# Patient Record
Sex: Male | Born: 1960 | Race: White | Hispanic: No | Smoking: Never smoker
Health system: Southern US, Community
[De-identification: ages and names within clinical notes are randomized; demographics above are authoritative.]

## PROBLEM LIST (undated history)

## (undated) DIAGNOSIS — F319 Bipolar disorder, unspecified: Secondary | ICD-10-CM

## (undated) DIAGNOSIS — I10 Essential (primary) hypertension: Secondary | ICD-10-CM

## (undated) DIAGNOSIS — E785 Hyperlipidemia, unspecified: Secondary | ICD-10-CM

## (undated) DIAGNOSIS — N529 Male erectile dysfunction, unspecified: Secondary | ICD-10-CM

## (undated) DIAGNOSIS — E119 Type 2 diabetes mellitus without complications: Secondary | ICD-10-CM

## (undated) DIAGNOSIS — R269 Unspecified abnormalities of gait and mobility: Secondary | ICD-10-CM

## (undated) DIAGNOSIS — K4090 Unilateral inguinal hernia, without obstruction or gangrene, not specified as recurrent: Secondary | ICD-10-CM

## (undated) DIAGNOSIS — G959 Disease of spinal cord, unspecified: Secondary | ICD-10-CM

## (undated) DIAGNOSIS — R202 Paresthesia of skin: Secondary | ICD-10-CM

## (undated) DIAGNOSIS — G709 Myoneural disorder, unspecified: Secondary | ICD-10-CM

## (undated) DIAGNOSIS — G629 Polyneuropathy, unspecified: Secondary | ICD-10-CM

## (undated) HISTORY — DX: Type 2 diabetes mellitus without complications: E11.9

## (undated) HISTORY — PX: HERNIA REPAIR: SHX51

## (undated) HISTORY — PX: VASECTOMY: SHX75

## (undated) HISTORY — DX: Essential (primary) hypertension: I10

## (undated) HISTORY — PX: CERVICAL SPINE SURGERY: SHX589

---
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
## 2009-05-27 ENCOUNTER — Ambulatory Visit: Payer: Self-pay | Admitting: General Surgery

## 2009-05-29 HISTORY — PX: APPENDECTOMY: SHX54

## 2009-06-03 ENCOUNTER — Ambulatory Visit: Payer: Self-pay | Admitting: General Surgery

## 2009-06-22 ENCOUNTER — Ambulatory Visit: Payer: Self-pay | Admitting: General Surgery

## 2009-12-10 ENCOUNTER — Ambulatory Visit: Payer: Self-pay | Admitting: General Surgery

## 2009-12-13 LAB — PATHOLOGY REPORT

## 2009-12-14 ENCOUNTER — Ambulatory Visit: Payer: Self-pay | Admitting: Unknown Physician Specialty

## 2012-06-09 LAB — URINALYSIS, COMPLETE
Blood: NEGATIVE
Glucose,UR: NEGATIVE mg/dL (ref 0–75)
Leukocyte Esterase: NEGATIVE
Ph: 5 (ref 4.5–8.0)
Squamous Epithelial: NONE SEEN
WBC UR: <1 /HPF (ref 0–5)

## 2012-06-09 LAB — CBC WITH DIFFERENTIAL/PLATELET
Basophil #: 0.1 10*3/uL (ref 0.0–0.1)
Basophil %: 0.8 %
Eosinophil #: 0.1 10*3/uL (ref 0.0–0.7)
HCT: 40.8 % (ref 40.0–52.0)
HGB: 13.3 g/dL (ref 13.0–18.0)
Lymphocyte #: 1.6 10*3/uL (ref 1.0–3.6)
Lymphocyte %: 23.2 %
MCH: 30.4 pg (ref 26.0–34.0)
MCV: 93 fL (ref 80–100)
Monocyte #: 0.8 x10 3/mm (ref 0.2–1.0)
Monocyte %: 11.3 %
Neutrophil #: 4.3 10*3/uL (ref 1.4–6.5)
Neutrophil %: 63.5 %
Platelet: 228 10*3/uL (ref 150–440)
RBC: 4.38 10*6/uL — ABNORMAL LOW (ref 4.40–5.90)

## 2012-06-09 LAB — DRUG SCREEN, URINE
Benzodiazepine, Ur Scrn: POSITIVE (ref ?–200)
MDMA (Ecstasy)Ur Screen: NEGATIVE (ref ?–500)
Methadone, Ur Screen: NEGATIVE (ref ?–300)
Tricyclic, Ur Screen: POSITIVE (ref ?–1000)

## 2012-06-09 LAB — COMPREHENSIVE METABOLIC PANEL
Alkaline Phosphatase: 73 U/L (ref 50–136)
Anion Gap: 8 (ref 7–16)
BUN: 11 mg/dL (ref 7–18)
Bilirubin,Total: 0.5 mg/dL (ref 0.2–1.0)
Calcium, Total: 8.3 mg/dL — ABNORMAL LOW (ref 8.5–10.1)
EGFR (African American): >60
EGFR (Non-African Amer.): >60
Osmolality: 282 (ref 275–301)
Total Protein: 6.4 g/dL (ref 6.4–8.2)

## 2012-06-09 LAB — ACETAMINOPHEN LEVEL: Acetaminophen: 80 ug/mL — ABNORMAL HIGH

## 2012-06-09 LAB — SALICYLATE LEVEL: Salicylates, Serum: <1.7 mg/dL

## 2012-06-09 LAB — ETHANOL: Ethanol %: <0.003 % (ref 0.000–0.080)

## 2012-06-10 ENCOUNTER — Inpatient Hospital Stay: Payer: Self-pay | Admitting: Psychiatry

## 2012-12-06 ENCOUNTER — Encounter: Payer: Self-pay | Admitting: *Deleted

## 2013-03-18 DIAGNOSIS — M47816 Spondylosis without myelopathy or radiculopathy, lumbar region: Secondary | ICD-10-CM | POA: Insufficient documentation

## 2013-03-18 DIAGNOSIS — M545 Low back pain, unspecified: Secondary | ICD-10-CM | POA: Insufficient documentation

## 2013-05-14 ENCOUNTER — Ambulatory Visit: Payer: Self-pay | Admitting: Family Medicine

## 2014-09-18 NOTE — H&P (Signed)
PATIENT NAME:  Derek Garza, Derek Garza MR#:  [id number] DATE OF BIRTH:  06-Mar-1961  DATE OF ADMISSION:  06/10/2012  REFERRING PHYSICIAN: Cecille Amsterdam. Mayford Knife, MD  ATTENDING PHYSICIAN: Ganon Demasi B. Kora Groom, MD   IDENTIFYING DATA: The patient is a 54 year old male with history of self-reported bipolar disorder and prescription pill misuse.   CHIEF COMPLAINT: "This is a misunderstanding."  HISTORY OF PRESENT ILLNESS: The patient was brought to the hospital after his wife found him unresponsive on the floor upon her return from a beach trip. The patient admits that he took several Tylenol PM in order to sleep. He reports that his mail order of narcotic pain killers prescribed for back pain did not arrive in the mail until today. For the past week, he did not have access to his medications. He denies that he was misusing his prescriptions, rather explained that the pharmacy was late. As he was off his narcotics, he tried to use other medications to treat his pain and on admission had elevated Tylenol level. The patient adamantly denies any problems with prescription pill. Per wife's report, however, he frequently misuses his pain medications, especially when she is out of town. She just returned from a beach trip with some girlfriends. The patient is rather paranoid. He believes that the wife is having an affair. He has all sorts of ideas about money inheritance that are disorganized. The wife also reports that the patient has been prescribed Xanax (yellow bar) that he takes 3 times a day, and the patient and his wife would prefer that he was no longer on the Xanax. The patient reports a long history of mood instability but was diagnosed with bipolar disorder in 2000. At that time, he had insurance and was treated by a psychiatrist. He is unable to name any medications that he took in the past. He lost his job since and has been unemployed and uninsured. At this point, however, he has insurance through his wife's  employer but apparently did not establish a relationship with a psychiatrist. He denies any problems. No depression, psychosis, anxiety or symptoms suggestive of bipolar mania. He denies alcohol, illicit substance or prescription drug misuse.   PAST PSYCHIATRIC HISTORY: There were no prior psychiatric hospitalizations or substance abuse treatment. As above, was treated for bipolar for a period of time. He admits to 2 prior medications overdoses but denies that they were ever suicide attempts.   FAMILY PSYCHIATRIC HISTORY: None reported.   PAST MEDICAL HISTORY: Back pain from multiple herniated disks, hypertension, congestive heart failure.   ALLERGIES: PERCOCET.   MEDICATIONS ON ADMISSION: Amitriptyline 50 mg at bedtime, Lotensin 40 mg daily, furosemide 80 mg daily, Neurontin 600 mg twice daily, potassium 10 mEq daily; Tylenol No. 4, 60 mg twice daily; Xanax unknown dose (yellow bar) 3 times daily.   SOCIAL HISTORY: He reportedly used to make good money. He used to build motorcycles. He lost all his money, unclear as to why. He is in some trouble with IRS and also has difficulties executing his mother's well. He lives with his wife who works for [employer]. Has not been able to work due to back problems. He applied for disability but has not heard back from them yet.   REVIEW OF SYSTEMS:  CONSTITUTIONAL: No fevers or chills. No weight changes.  EYES: No double or blurred vision.  ENT: No hearing loss.  RESPIRATORY: No shortness of breath or cough.  CARDIOVASCULAR: No chest pain or orthopnea.  GASTROINTESTINAL: No abdominal pain, nausea, vomiting or diarrhea.  GENITOURINARY: No incontinence or frequency.  ENDOCRINE: No heat or cold intolerance.  LYMPHATIC: No anemia or easy bruising.  INTEGUMENTARY: No acne or rash. MUSCULOSKELETAL: Positive for back pain radiating to both legs.  NEUROLOGIC: No tingling or weakness.  PSYCHIATRIC: See history of present illness for details.   PHYSICAL  EXAMINATION:  VITAL SIGNS: Blood pressure 114/80, pulse 113, respirations 20, temperature 98.3. GENERAL: This is a well-developed male in no acute distress.  HEENT: The pupils are equal, round and reactive to light. Sclerae are anicteric.  NECK: Supple. No thyromegaly.  LUNGS: Clear to auscultation. No dullness to percussion.  HEART: Regular rhythm and rate. No murmurs, rubs or gallops.  ABDOMEN: Soft, nontender, nondistended. Positive bowel sounds.  MUSCULOSKELETAL: Normal muscle strength in all extremities.  SKIN: No rashes or bruises.  LYMPHATIC: No cervical adenopathy.  NEUROLOGIC: Cranial nerves II through XII are intact.   LABORATORY AND DIAGNOSTIC DATA: Chemistries are within normal limits. Blood alcohol level is zero. LFTs within normal limits. TSH not done. Urine tox screen positive for amphetamines, benzodiazepines and tricyclic antidepressants. CBC within normal limits. Urinalysis is not suggestive of urinary tract infection. Serum acetaminophen on admission 80, on recheck 26. Serum salicylate 1.7. EKG: Normal sinus rhythm, nonspecific intraventricular conduction block, abnormal EKG.  MENTAL STATUS EXAMINATION ON ADMISSION: The patient is evaluated in the Emergency Room. He is alert and oriented to person, place and time, somewhat to the situation. He does not remember any details leading to his admission. He is irritable and marginally cooperative. He is wearing hospital scrubs and a yellow shirt. He maintains some eye contact. His speech is pressured at times. Mood is expansive with labile affect. Thought processing: There are racing thoughts and tangential thoughts. He denies suicidal or homicidal ideation but was brought to the hospital after an apparent overdose. He is slightly paranoid. There are no auditory or visual hallucinations. His cognition is grossly intact. He registers 3 out of 3 and recalls 3 out of 3 objects after 5 minutes. He can spell "world" forwards and backwards. He  knows the current president. His insight and judgment are questionable.   SUICIDE RISK ASSESSMENT ON ADMISSION: This is a patient with a long history of depression, mood instability and prescription pill abuse who was admitted to the hospital after a suicide attempt by medication overdose.   DIAGNOSES:  AXIS I: Mood disorder, not otherwise specified. Opioid dependence. Benzodiazepine dependence.  AXIS II: Deferred.  AXIS III: Chronic back pain. Hypertension. Congestive heart failure.  AXIS IV: Mental and physical illness. Chronic pain. Substance abuse. Marital, financial, employment.  AXIS V: Global Assessment of Functioning on admission 25.   PLAN: The patient was admitted to [facility] for safety, stabilization and medication management. He was initially placed on suicide precautions and was closely monitored for any unsafe behaviors. He underwent full psychiatric and risk assessment. He received pharmacotherapy, individual and group psychotherapy, substance abuse counseling and support from therapeutic milieu.  1.  Suicidal ideation: The patient adamantly denies. 2.  Mood: The patient reports history of bipolar disorder. He is currently on no mood stabilizer except for Neurontin and on amitriptyline for depression. We will initially continue all his medications and will discuss introduction of a mood stabilizer when the patient is more stable. 3.  Chronic pain: The patient reports being prescribed Tylenol No. 4, 60 mg twice daily, and I will not prescribe any medication that contains Tylenol given his overdose. Will offer tramadol. 4.  Benzodiazepine  dependence: Per wife's report, the patient is misusing Xanax. He was positive for benzodiazepines on admission. I will placed him on an Ativan taper over 3 days.  5.  Medical: We will continue furosemide, potassium and Lotensin.  6.  Substance abuse treatment: Will discuss it when the patient is in  better shape.  7.  Disposition: He will likely return to home with wife.   ____________________________ Ellin Goodie. Stevenson Windmiller, MD [email or web address] D: 06/11/2012 14:34:24 ET T: 06/11/2012 15:31:34 ET JOB#: [id number]  cc: Braidon Chermak B. Jennet Maduro, MD, <Dictator> Shari Prows MD ELECTRONICALLY SIGNED 06/13/2012 7:53

## 2014-09-18 NOTE — Consult Note (Signed)
Brief Consult Note: Diagnosis: Mood disorder NOS, S/P overdose.   Patient was seen by consultant.   Comments: Will admitt to psychiatry.  Electronic Signatures: Kristine LineaPucilowska, Jolanta (MD)  (Signed 13-Jan-14 21:24)  Authored: Brief Consult Note   Last Updated: 13-Jan-14 21:24 by Kristine LineaPucilowska, Jolanta (MD)

## 2014-12-07 ENCOUNTER — Other Ambulatory Visit: Payer: Self-pay | Admitting: Family Medicine

## 2015-01-27 ENCOUNTER — Encounter: Payer: Self-pay | Admitting: Family Medicine

## 2015-01-27 ENCOUNTER — Ambulatory Visit (INDEPENDENT_AMBULATORY_CARE_PROVIDER_SITE_OTHER): Payer: 59 | Admitting: Family Medicine

## 2015-01-27 VITALS — BP 124/72 | HR 91 | Temp 97.6°F | Ht 70.5 in | Wt 242.0 lb

## 2015-01-27 DIAGNOSIS — E1142 Type 2 diabetes mellitus with diabetic polyneuropathy: Secondary | ICD-10-CM | POA: Insufficient documentation

## 2015-01-27 DIAGNOSIS — I152 Hypertension secondary to endocrine disorders: Secondary | ICD-10-CM | POA: Insufficient documentation

## 2015-01-27 DIAGNOSIS — I10 Essential (primary) hypertension: Secondary | ICD-10-CM | POA: Diagnosis not present

## 2015-01-27 DIAGNOSIS — E119 Type 2 diabetes mellitus without complications: Secondary | ICD-10-CM

## 2015-01-27 DIAGNOSIS — R609 Edema, unspecified: Secondary | ICD-10-CM | POA: Insufficient documentation

## 2015-01-27 DIAGNOSIS — F319 Bipolar disorder, unspecified: Secondary | ICD-10-CM | POA: Diagnosis not present

## 2015-01-27 DIAGNOSIS — E291 Testicular hypofunction: Secondary | ICD-10-CM | POA: Diagnosis not present

## 2015-01-27 DIAGNOSIS — F3181 Bipolar II disorder: Secondary | ICD-10-CM | POA: Insufficient documentation

## 2015-01-27 DIAGNOSIS — E1159 Type 2 diabetes mellitus with other circulatory complications: Secondary | ICD-10-CM | POA: Insufficient documentation

## 2015-01-27 LAB — MICROALBUMIN, URINE WAIVED
Creatinine, Urine Waived: 100 mg/dL (ref 10–300)
Microalb, Ur Waived: 30 mg/L — ABNORMAL HIGH (ref 0–19)

## 2015-01-27 LAB — BAYER DCA HB A1C WAIVED: HB A1C: 7.6 % — AB (ref <7.0)

## 2015-01-27 MED ORDER — TADALAFIL 20 MG PO TABS: 20.0000 mg | ORAL_TABLET | Freq: Every day | ORAL | Status: DC | PRN

## 2015-01-27 MED ORDER — GABAPENTIN 600 MG PO TABS: 600.0000 mg | ORAL_TABLET | Freq: Two times a day (BID) | ORAL | Status: DC

## 2015-01-27 MED ORDER — FUROSEMIDE 80 MG PO TABS: 80.0000 mg | ORAL_TABLET | Freq: Every day | ORAL | Status: DC

## 2015-01-27 MED ORDER — TESTOSTERONE CYPIONATE 200 MG/ML IM SOLN: 200.0000 mg | INTRAMUSCULAR | Status: DC

## 2015-01-27 MED ORDER — POTASSIUM CHLORIDE ER 10 MEQ PO TBCR: 10.0000 meq | EXTENDED_RELEASE_TABLET | Freq: Once | ORAL | Status: DC

## 2015-01-27 MED ORDER — BENAZEPRIL HCL 40 MG PO TABS: 40.0000 mg | ORAL_TABLET | Freq: Every day | ORAL | Status: DC

## 2015-01-27 NOTE — Assessment & Plan Note (Signed)
Discussed diabetes need for better diet and exercise nutrition patient indicates he will try Also discussed with age getting worse will start metformin 500 at bedtime check blood sugars see if he needs it during the daytime also

## 2015-01-27 NOTE — Progress Notes (Signed)
BP 124/72 mmHg  Pulse 91  Temp(Src) 97.6 F (36.4 C)  Ht 5' 10.5" (1.791 m)  Wt 242 lb (109.77 kg)  BMI 34.22 kg/m2  SpO2 95%   Subjective:    Patient ID: Derek Garza, male    DOB: 18-Oct-1960, 54 y.o.   MRN: [id number]  HPI: Derek Garza is a 54 y.o. male  Chief Complaint  Patient presents with  . Diabetes   patient follow-up diabetes has been on no special diet. And not checking blood sugar except for rarely. No noted excursions are low points not taking any medications. Patient had an use medicine since gastric bypass surgery in 2012.  Nerves bipolar stable on medications  Doing testosterone replacement is been 3 weeks since last shot so will not do blood level at this point. Discussed testing on peak and trough levels sometime this fall.   Fluid retention stable on Lasix Gabapentin stable Blood pressure stable  Relevant past medical, surgical, family and social history reviewed and updated as indicated. Interim medical history since our last visit reviewed. Allergies and medications reviewed and updated.  Review of Systems  Constitutional: Negative.   Respiratory: Negative.   Cardiovascular: Negative.     Per HPI unless specifically indicated above     Objective:    BP 124/72 mmHg  Pulse 91  Temp(Src) 97.6 F (36.4 C)  Ht 5' 10.5" (1.791 m)  Wt 242 lb (109.77 kg)  BMI 34.22 kg/m2  SpO2 95%  Wt Readings from Last 3 Encounters:  01/27/15 242 lb (109.77 kg)    Physical Exam  Constitutional: He is oriented to person, place, and time. He appears well-developed and well-nourished. No distress.  HENT:  Head: Normocephalic and atraumatic.  Right Ear: Hearing normal.  Left Ear: Hearing normal.  Nose: Nose normal.  Eyes: Conjunctivae and lids are normal. Right eye exhibits no discharge. Left eye exhibits no discharge. No scleral icterus.  Cardiovascular: Normal rate, regular rhythm and normal heart sounds.   Pulmonary/Chest: Effort normal and breath  sounds normal. No respiratory distress.  Musculoskeletal: Normal range of motion.  Neurological: He is alert and oriented to person, place, and time.  Skin: Skin is intact. No rash noted.  Psychiatric: He has a normal mood and affect. His speech is normal and behavior is normal. Judgment and thought content normal. Cognition and memory are normal.        Assessment & Plan:   Problem List Items Addressed This Visit      Cardiovascular and Mediastinum   Hypertension    The current medical regimen is effective;  continue present plan and medications.       Relevant Medications   tadalafil (CIALIS) 20 MG tablet   furosemide (LASIX) 80 MG tablet   benazepril (LOTENSIN) 40 MG tablet   Other Relevant Orders   Basic metabolic panel     Endocrine   Diabetes mellitus without complication - Primary    Discussed diabetes need for better diet and exercise nutrition patient indicates he will try Also discussed with age getting worse will start metformin 500 at bedtime check blood sugars see if he needs it during the daytime also      Relevant Medications   benazepril (LOTENSIN) 40 MG tablet   Other Relevant Orders   Bayer DCA Hb A1c Waived   Microalbumin, Urine Waived   Bayer DCA Hb A1c Waived   Basic metabolic panel   Hypogonadism in male    No issues with testosterone  Relevant Medications   testosterone cypionate (DEPOTESTOSTERONE CYPIONATE) 200 MG/ML injection   tadalafil (CIALIS) 20 MG tablet     Other   Bipolar 1 disorder    Followed by psychiatry      Peripheral edema   Relevant Medications   potassium chloride (K-DUR) 10 MEQ tablet   furosemide (LASIX) 80 MG tablet       Follow up plan: Return in about 3 months (around 04/28/2015), or if symptoms worsen or fail to improve, for med check.

## 2015-01-27 NOTE — Assessment & Plan Note (Signed)
The current medical regimen is effective;  continue present plan and medications.  

## 2015-01-27 NOTE — Assessment & Plan Note (Signed)
No issues with testosterone

## 2015-01-27 NOTE — Assessment & Plan Note (Signed)
Followed by psychiatry 

## 2015-01-29 ENCOUNTER — Other Ambulatory Visit: Payer: Self-pay | Admitting: Unknown Physician Specialty

## 2015-01-29 DIAGNOSIS — R609 Edema, unspecified: Secondary | ICD-10-CM

## 2015-01-29 MED ORDER — POTASSIUM CHLORIDE ER 10 MEQ PO TBCR: 10.0000 meq | EXTENDED_RELEASE_TABLET | Freq: Every day | ORAL | Status: DC

## 2015-03-03 ENCOUNTER — Encounter: Payer: Self-pay | Admitting: General Surgery

## 2015-03-04 ENCOUNTER — Ambulatory Visit: Payer: Self-pay | Admitting: General Surgery

## 2015-03-25 ENCOUNTER — Other Ambulatory Visit: Payer: Self-pay | Admitting: Family Medicine

## 2015-03-25 ENCOUNTER — Telehealth: Payer: Self-pay | Admitting: Family Medicine

## 2015-03-25 MED ORDER — METFORMIN HCL 500 MG PO TABS: 500.0000 mg | ORAL_TABLET | Freq: Two times a day (BID) | ORAL | Status: DC

## 2015-03-25 NOTE — Telephone Encounter (Signed)
Call patient Is asking about an Rx for metformin. I have never prescribed him metformin nor is metformin in his old records.

## 2015-03-25 NOTE — Telephone Encounter (Signed)
Pt called stated mail order pharm does not have an RX for his Metformin. Pharm is [facility]. Please send this RX ASAP. Thanks.

## 2015-04-26 ENCOUNTER — Encounter: Payer: Self-pay | Admitting: Family Medicine

## 2015-04-26 ENCOUNTER — Ambulatory Visit (INDEPENDENT_AMBULATORY_CARE_PROVIDER_SITE_OTHER): Payer: 59 | Admitting: Family Medicine

## 2015-04-26 VITALS — BP 104/66 | HR 84 | Temp 97.7°F | Ht 71.3 in | Wt 242.0 lb

## 2015-04-26 DIAGNOSIS — E291 Testicular hypofunction: Secondary | ICD-10-CM

## 2015-04-26 DIAGNOSIS — F319 Bipolar disorder, unspecified: Secondary | ICD-10-CM | POA: Diagnosis not present

## 2015-04-26 DIAGNOSIS — I10 Essential (primary) hypertension: Secondary | ICD-10-CM | POA: Diagnosis not present

## 2015-04-26 DIAGNOSIS — E119 Type 2 diabetes mellitus without complications: Secondary | ICD-10-CM

## 2015-04-26 DIAGNOSIS — Z23 Encounter for immunization: Secondary | ICD-10-CM

## 2015-04-26 LAB — BAYER DCA HB A1C WAIVED: HB A1C (BAYER DCA - WAIVED): 7.4 % — ABNORMAL HIGH (ref <7.0)

## 2015-04-26 NOTE — Assessment & Plan Note (Signed)
The current medical regimen is effective;  continue present plan and medications.  

## 2015-04-26 NOTE — Assessment & Plan Note (Addendum)
The current medical regimen is effective;  continue present plan and medications. Discussed need to continue improvement in care especially weight loss diet exercise nutrition

## 2015-04-26 NOTE — Assessment & Plan Note (Signed)
Followed by psychiatry and stable 

## 2015-04-26 NOTE — Progress Notes (Signed)
BP 104/66 mmHg  Pulse 84  Temp(Src) 97.7 F (36.5 C)  Ht 5' 11.3" (1.811 m)  Wt 242 lb (109.77 kg)  BMI 33.47 kg/m2  SpO2 96%   Subjective:    Patient ID: Derek Garza, male    DOB: [birth year], 54 y.o.   MRN: [id number]  HPI: Derek Garza is a 54 y.o. male  Chief Complaint  Patient presents with  . Diabetes  . Hypertension   patient doing well diabetes noted low blood sugar spells hemoglobin A1c continues to decline patient's weight has stayed stable over this fall. Blood pressure doing well no complaints from medications Nerves are stable with medications no issues  Relevant past medical, surgical, family and social history reviewed and updated as indicated. Interim medical history since our last visit reviewed. Allergies and medications reviewed and updated.  Review of Systems  Constitutional: Negative.   Respiratory: Negative.   Cardiovascular: Negative.     Per HPI unless specifically indicated above     Objective:    BP 104/66 mmHg  Pulse 84  Temp(Src) 97.7 F (36.5 C)  Ht 5' 11.3" (1.811 m)  Wt 242 lb (109.77 kg)  BMI 33.47 kg/m2  SpO2 96%  Wt Readings from Last 3 Encounters:  04/26/15 242 lb (109.77 kg)  01/27/15 242 lb (109.77 kg)    Physical Exam  Constitutional: He is oriented to person, place, and time. He appears well-developed and well-nourished. No distress.  HENT:  Head: Normocephalic and atraumatic.  Right Ear: Hearing normal.  Left Ear: Hearing normal.  Nose: Nose normal.  Eyes: Conjunctivae and lids are normal. Right eye exhibits no discharge. Left eye exhibits no discharge. No scleral icterus.  Cardiovascular: Normal rate, regular rhythm and normal heart sounds.   Pulmonary/Chest: Effort normal and breath sounds normal. No respiratory distress.  Musculoskeletal: Normal range of motion.  Neurological: He is alert and oriented to person, place, and time.  Skin: Skin is intact. No rash noted.  Psychiatric: He has a normal mood and  affect. His speech is normal and behavior is normal. Judgment and thought content normal. Cognition and memory are normal.    Results for orders placed or performed in visit on 01/27/15  Bayer DCA Hb A1c Waived  Result Value Ref Range   Bayer DCA Hb A1c Waived 7.6 (H) <7.0 %  Microalbumin, Urine Waived  Result Value Ref Range   Microalb, Ur Waived 30 (H) 0 - 19 mg/L   Creatinine, Urine Waived 100 10 - 300 mg/dL   Microalb/Creat Ratio 30-300 (H) <30 mg/g      Assessment & Plan:   Problem List Items Addressed This Visit      Cardiovascular and Mediastinum   Hypertension    The current medical regimen is effective;  continue present plan and medications.         Endocrine   Hypogonadism in male    The current medical regimen is effective;  continue present plan and medications.       Diabetes mellitus without complication (HCC)    The current medical regimen is effective;  continue present plan and medications. Discussed need to continue improvement in care especially weight loss diet exercise nutrition         Other   Bipolar 1 disorder (HCC)    Followed by psychiatry and stable       Other Visit Diagnoses    Immunization due    -  Primary    Relevant Orders    Flu  Vaccine QUAD 36+ mos PF IM (Fluarix & Fluzone Quad PF) (Completed)        Follow up plan: Return in about 3 months (around 07/27/2015) for Physical Exam March and testosterone A1C.

## 2015-04-27 ENCOUNTER — Encounter: Payer: Self-pay | Admitting: Family Medicine

## 2015-04-27 LAB — BASIC METABOLIC PANEL
BUN / CREAT RATIO: 10 (ref 9–20)
BUN: 10 mg/dL (ref 6–24)
CO2: 26 mmol/L (ref 18–29)
CREATININE: 0.99 mg/dL (ref 0.76–1.27)
Calcium: 9.1 mg/dL (ref 8.7–10.2)
Chloride: 93 mmol/L — ABNORMAL LOW (ref 97–106)
GFR, EST AFRICAN AMERICAN: 99 mL/min/{1.73_m2} (ref >59)
GFR, EST NON AFRICAN AMERICAN: 85 mL/min/{1.73_m2} (ref >59)
GLUCOSE: 220 mg/dL — AB (ref 65–99)
Potassium: 4.7 mmol/L (ref 3.5–5.2)
SODIUM: 137 mmol/L (ref 136–144)

## 2015-05-12 ENCOUNTER — Encounter: Payer: Self-pay | Admitting: *Deleted

## 2015-08-16 ENCOUNTER — Ambulatory Visit (INDEPENDENT_AMBULATORY_CARE_PROVIDER_SITE_OTHER): Payer: 59 | Admitting: Family Medicine

## 2015-08-16 ENCOUNTER — Encounter: Payer: Self-pay | Admitting: Family Medicine

## 2015-08-16 VITALS — BP 122/72 | HR 90 | Temp 98.4°F | Ht 72.0 in | Wt 247.0 lb

## 2015-08-16 DIAGNOSIS — E119 Type 2 diabetes mellitus without complications: Secondary | ICD-10-CM

## 2015-08-16 DIAGNOSIS — J019 Acute sinusitis, unspecified: Secondary | ICD-10-CM | POA: Diagnosis not present

## 2015-08-16 DIAGNOSIS — Z0000 Encounter for general adult medical examination without abnormal findings: Secondary | ICD-10-CM | POA: Diagnosis not present

## 2015-08-16 DIAGNOSIS — I10 Essential (primary) hypertension: Secondary | ICD-10-CM

## 2015-08-16 DIAGNOSIS — E291 Testicular hypofunction: Secondary | ICD-10-CM | POA: Diagnosis not present

## 2015-08-16 DIAGNOSIS — R609 Edema, unspecified: Secondary | ICD-10-CM | POA: Diagnosis not present

## 2015-08-16 DIAGNOSIS — R635 Abnormal weight gain: Secondary | ICD-10-CM | POA: Diagnosis not present

## 2015-08-16 DIAGNOSIS — Z113 Encounter for screening for infections with a predominantly sexual mode of transmission: Secondary | ICD-10-CM

## 2015-08-16 LAB — BAYER DCA HB A1C WAIVED: HB A1C (BAYER DCA - WAIVED): 8.8 % — ABNORMAL HIGH (ref <7.0)

## 2015-08-16 LAB — URINALYSIS, ROUTINE W REFLEX MICROSCOPIC
Bilirubin, UA: NEGATIVE
Ketones, UA: NEGATIVE
Leukocytes, UA: NEGATIVE
Nitrite, UA: NEGATIVE
PH UA: 5.5 (ref 5.0–7.5)
PROTEIN UA: NEGATIVE
RBC, UA: NEGATIVE
Specific Gravity, UA: 1.02 (ref 1.005–1.030)
UUROB: 0.2 mg/dL (ref 0.2–1.0)

## 2015-08-16 MED ORDER — FUROSEMIDE 80 MG PO TABS: 80.0000 mg | ORAL_TABLET | Freq: Every day | ORAL | Status: DC

## 2015-08-16 MED ORDER — GABAPENTIN 600 MG PO TABS: 600.0000 mg | ORAL_TABLET | Freq: Two times a day (BID) | ORAL | Status: DC

## 2015-08-16 MED ORDER — BENAZEPRIL HCL 40 MG PO TABS: 40.0000 mg | ORAL_TABLET | Freq: Every day | ORAL | Status: DC

## 2015-08-16 MED ORDER — EMPAGLIFLOZIN 25 MG PO TABS: 25.0000 mg | ORAL_TABLET | Freq: Every day | ORAL | Status: DC

## 2015-08-16 MED ORDER — TESTOSTERONE CYPIONATE 200 MG/ML IM SOLN: 200.0000 mg | INTRAMUSCULAR | Status: DC

## 2015-08-16 MED ORDER — METFORMIN HCL 500 MG PO TABS: 500.0000 mg | ORAL_TABLET | Freq: Two times a day (BID) | ORAL | Status: DC

## 2015-08-16 MED ORDER — AMOXICILLIN 875 MG PO TABS: 875.0000 mg | ORAL_TABLET | Freq: Two times a day (BID) | ORAL | Status: DC

## 2015-08-16 MED ORDER — POTASSIUM CHLORIDE ER 10 MEQ PO TBCR: 10.0000 meq | EXTENDED_RELEASE_TABLET | Freq: Every day | ORAL | Status: DC

## 2015-08-16 NOTE — Progress Notes (Signed)
BP 122/72 mmHg  Pulse 90  Temp(Src) 98.4 F (36.9 C)  Ht 6' (1.829 m)  Wt 247 lb (112.038 kg)  BMI 33.49 kg/m2  SpO2 95%   Subjective:    Patient ID: Derek Garza, male    DOB: [birth year], 55 y.o.   MRN: [id number]  HPI: Derek ChuteSteven Sappington is a 55 y.o. male  Chief Complaint  Patient presents with  . Annual Exam   patient all in all doing well with medical conditions which are all stable Patient has had cold congestion cough chills ongoing for the last 2 weeks getting worse Medications no side effects Diabetic peripheral neuropathy is doing well with gabapentin Seroquel and nerves are getting along okay And testosterone injection every 2 weeks is doing well.  Relevant past medical, surgical, family and social history reviewed and updated as indicated. Interim medical history since our last visit reviewed. Allergies and medications reviewed and updated.  Review of Systems  Constitutional: Negative.   HENT: Negative.   Eyes: Negative.   Respiratory: Negative.   Cardiovascular: Negative.   Gastrointestinal: Negative.   Endocrine: Negative.   Genitourinary: Negative.   Musculoskeletal: Negative.   Skin: Negative.   Allergic/Immunologic: Negative.   Neurological: Negative.   Hematological: Negative.   Psychiatric/Behavioral: Negative.     Per HPI unless specifically indicated above     Objective:    BP 122/72 mmHg  Pulse 90  Temp(Src) 98.4 F (36.9 C)  Ht 6' (1.829 m)  Wt 247 lb (112.038 kg)  BMI 33.49 kg/m2  SpO2 95%  Wt Readings from Last 3 Encounters:  08/16/15 247 lb (112.038 kg)  04/26/15 242 lb (109.77 kg)  01/27/15 242 lb (109.77 kg)    Physical Exam  Constitutional: He is oriented to person, place, and time. He appears well-developed and well-nourished.  HENT:  Head: Normocephalic and atraumatic.  Right Ear: External ear normal.  Left Ear: External ear normal.  Eyes: Conjunctivae and EOM are normal. Pupils are equal, round, and reactive to  light.  Neck: Normal range of motion. Neck supple.  Cardiovascular: Normal rate, regular rhythm, normal heart sounds and intact distal pulses.   Pulmonary/Chest: Effort normal and breath sounds normal.  Abdominal: Soft. Bowel sounds are normal. There is no splenomegaly or hepatomegaly.  Genitourinary: Rectum normal, prostate normal and penis normal.  Musculoskeletal: Normal range of motion.  Neurological: He is alert and oriented to person, place, and time. He has normal reflexes.  Skin: No rash noted. No erythema.  Psychiatric: He has a normal mood and affect. His behavior is normal. Judgment and thought content normal.    Results for orders placed or performed in visit on 04/26/15  Bayer DCA Hb A1c Waived  Result Value Ref Range   Bayer DCA Hb A1c Waived 7.4 (H) <7.0 %  Basic metabolic panel  Result Value Ref Range   Glucose 220 (H) 65 - 99 mg/dL   BUN 10 6 - 24 mg/dL   Creatinine, Ser 4.090.99 0.76 - 1.27 mg/dL   GFR calc non Af Amer 85 >59 mL/min/1.73   GFR calc Af Amer 99 >59 mL/min/1.73   BUN/Creatinine Ratio 10 9 - 20   Sodium 137 136 - 144 mmol/L   Potassium 4.7 3.5 - 5.2 mmol/L   Chloride 93 (L) 97 - 106 mmol/L   CO2 26 18 - 29 mmol/L   Calcium 9.1 8.7 - 10.2 mg/dL      Assessment & Plan:   Problem List Items Addressed This Visit  Cardiovascular and Mediastinum   Hypertension   Relevant Medications   benazepril (LOTENSIN) 40 MG tablet   furosemide (LASIX) 80 MG tablet     Endocrine   Diabetes mellitus without complication (HCC)    Chest diabetes poor control first-line treatment diet exercise nutrition discuss adding Jardiance Discuss risk and benefit mitigation of side effects      Relevant Medications   benazepril (LOTENSIN) 40 MG tablet   metFORMIN (GLUCOPHAGE) 500 MG tablet   empagliflozin (JARDIANCE) 25 MG TABS tablet   Other Relevant Orders   Bayer DCA Hb A1c Waived   Hypogonadism in male   Relevant Medications   testosterone cypionate  (DEPOTESTOSTERONE CYPIONATE) 200 MG/ML injection   Other Relevant Orders   Testosterone   Testosterone     Other   Peripheral edema   Relevant Medications   furosemide (LASIX) 80 MG tablet    Other Visit Diagnoses    Routine screening for STI (sexually transmitted infection)    -  Primary    Relevant Orders    Hepatitis C Antibody    Acute sinusitis, recurrence not specified, unspecified location        Discussed sinusitis care and treatment use of antibiotics cautions use of over-the-counter medications    Relevant Medications    amoxicillin (AMOXIL) 875 MG tablet    PE (physical exam), annual        Relevant Orders    Comprehensive metabolic panel    Lipid panel    CBC with Differential/Platelet    TSH    Urinalysis, Routine w reflex microscopic (not at [facility])    PSA        Follow up plan: Return in about 3 months (around 11/16/2015) for a1c.

## 2015-08-16 NOTE — Addendum Note (Signed)
Addended byVonita Moss: Chrsitopher Wik on: 08/16/2015 04:53 PM   Modules accepted: Kipp BroodSmartSet

## 2015-08-16 NOTE — Assessment & Plan Note (Signed)
Chest diabetes poor control first-line treatment diet exercise nutrition discuss adding Jardiance Discuss risk and benefit mitigation of side effects

## 2015-08-17 LAB — CBC WITH DIFFERENTIAL/PLATELET
BASOS ABS: 0 10*3/uL (ref 0.0–0.2)
Basos: 0 %
EOS (ABSOLUTE): 0.1 10*3/uL (ref 0.0–0.4)
Eos: 1 %
Hematocrit: 42.7 % (ref 37.5–51.0)
Hemoglobin: 13.8 g/dL (ref 12.6–17.7)
IMMATURE GRANS (ABS): 0 10*3/uL (ref 0.0–0.1)
IMMATURE GRANULOCYTES: 0 %
LYMPHS: 19 %
Lymphocytes Absolute: 1.6 10*3/uL (ref 0.7–3.1)
MCH: 29.2 pg (ref 26.6–33.0)
MCHC: 32.3 g/dL (ref 31.5–35.7)
MCV: 91 fL (ref 79–97)
MONOS ABS: 1 10*3/uL — AB (ref 0.1–0.9)
Monocytes: 12 %
NEUTROS PCT: 68 %
Neutrophils Absolute: 5.7 10*3/uL (ref 1.4–7.0)
PLATELETS: 292 10*3/uL (ref 150–379)
RBC: 4.72 x10E6/uL (ref 4.14–5.80)
RDW: 13 % (ref 12.3–15.4)
WBC: 8.4 10*3/uL (ref 3.4–10.8)

## 2015-08-17 LAB — COMPREHENSIVE METABOLIC PANEL
A/G RATIO: 1.6 (ref 1.2–2.2)
ALT: 22 IU/L (ref 0–44)
AST: 19 IU/L (ref 0–40)
Albumin: 4.2 g/dL (ref 3.5–5.5)
Alkaline Phosphatase: 115 IU/L (ref 39–117)
BILIRUBIN TOTAL: 0.2 mg/dL (ref 0.0–1.2)
BUN / CREAT RATIO: 13 (ref 9–20)
BUN: 15 mg/dL (ref 6–24)
CHLORIDE: 92 mmol/L — AB (ref 96–106)
CO2: 24 mmol/L (ref 18–29)
Calcium: 9.1 mg/dL (ref 8.7–10.2)
Creatinine, Ser: 1.17 mg/dL (ref 0.76–1.27)
GFR calc non Af Amer: 69 mL/min/{1.73_m2} (ref >59)
GFR, EST AFRICAN AMERICAN: 80 mL/min/{1.73_m2} (ref >59)
Globulin, Total: 2.6 g/dL (ref 1.5–4.5)
Glucose: 226 mg/dL — ABNORMAL HIGH (ref 65–99)
POTASSIUM: 4.3 mmol/L (ref 3.5–5.2)
SODIUM: 136 mmol/L (ref 134–144)
TOTAL PROTEIN: 6.8 g/dL (ref 6.0–8.5)

## 2015-08-17 LAB — LIPID PANEL
Chol/HDL Ratio: 4.8 ratio units (ref 0.0–5.0)
Cholesterol, Total: 184 mg/dL (ref 100–199)
HDL: 38 mg/dL — AB (ref >39)
LDL Calculated: 97 mg/dL (ref 0–99)
Triglycerides: 246 mg/dL — ABNORMAL HIGH (ref 0–149)
VLDL Cholesterol Cal: 49 mg/dL — ABNORMAL HIGH (ref 5–40)

## 2015-08-17 LAB — PSA: PROSTATE SPECIFIC AG, SERUM: 3 ng/mL (ref 0.0–4.0)

## 2015-08-17 LAB — HCV ANTIBODY: Hep C Virus Ab: <0.1 s/co ratio (ref 0.0–0.9)

## 2015-08-17 LAB — TSH: TSH: 1.45 u[IU]/mL (ref 0.450–4.500)

## 2015-08-18 ENCOUNTER — Encounter: Payer: Self-pay | Admitting: Family Medicine

## 2015-08-23 ENCOUNTER — Encounter: Payer: Self-pay | Admitting: Family Medicine

## 2015-09-02 ENCOUNTER — Other Ambulatory Visit: Payer: Self-pay | Admitting: Family Medicine

## 2015-09-02 ENCOUNTER — Telehealth: Payer: Self-pay | Admitting: Family Medicine

## 2015-09-02 DIAGNOSIS — E291 Testicular hypofunction: Secondary | ICD-10-CM

## 2015-09-02 NOTE — Telephone Encounter (Signed)
Pt called stated he needs a new RX for Cialis sent to the pharmacy the other one has expired. [facility] [place] [facility] [place] Phone # [phone]. Thanks.

## 2015-09-06 MED ORDER — TADALAFIL 20 MG PO TABS: 20.0000 mg | ORAL_TABLET | Freq: Every day | ORAL | Status: DC | PRN

## 2015-10-15 ENCOUNTER — Other Ambulatory Visit: Payer: Self-pay

## 2015-10-15 NOTE — Telephone Encounter (Signed)
[facility] requesting refill for jardiance.

## 2015-10-18 MED ORDER — EMPAGLIFLOZIN 25 MG PO TABS: 25.0000 mg | ORAL_TABLET | Freq: Every day | ORAL | Status: DC

## 2015-11-17 ENCOUNTER — Ambulatory Visit: Payer: 59 | Admitting: Family Medicine

## 2015-11-22 ENCOUNTER — Ambulatory Visit: Payer: 59 | Admitting: Family Medicine

## 2016-01-03 ENCOUNTER — Ambulatory Visit: Payer: 59 | Admitting: Family Medicine

## 2016-01-10 ENCOUNTER — Ambulatory Visit (INDEPENDENT_AMBULATORY_CARE_PROVIDER_SITE_OTHER): Payer: 59 | Admitting: Family Medicine

## 2016-01-10 ENCOUNTER — Encounter: Payer: Self-pay | Admitting: Family Medicine

## 2016-01-10 ENCOUNTER — Other Ambulatory Visit: Payer: Self-pay

## 2016-01-10 VITALS — BP 118/70 | HR 91 | Temp 97.7°F | Ht 71.0 in | Wt 235.0 lb

## 2016-01-10 DIAGNOSIS — E119 Type 2 diabetes mellitus without complications: Secondary | ICD-10-CM

## 2016-01-10 DIAGNOSIS — E291 Testicular hypofunction: Secondary | ICD-10-CM

## 2016-01-10 DIAGNOSIS — I10 Essential (primary) hypertension: Secondary | ICD-10-CM

## 2016-01-10 LAB — HEMOGLOBIN A1C: HEMOGLOBIN A1C: 7.3

## 2016-01-10 LAB — BAYER DCA HB A1C WAIVED: HB A1C: 7.3 % — AB (ref <7.0)

## 2016-01-10 MED ORDER — EMPAGLIFLOZIN 25 MG PO TABS: 25.0000 mg | ORAL_TABLET | Freq: Every day | ORAL | 3 refills | Status: DC

## 2016-01-10 NOTE — Progress Notes (Signed)
BP 118/70 (BP Location: Left Arm, Patient Position: Sitting, Cuff Size: Normal)   Pulse 91   Temp 97.7 F (36.5 C)   Ht 5\' 11"  (1.803 m)   Wt 235 lb (106.6 kg)   SpO2 99%   BMI 32.78 kg/m    Subjective:    Patient ID: Derek Garza, male    DOB: 12/01/1960, 55 y.o.   MRN: [id number]  HPI: Derek ChuteSteven Mazariego is a 55 y.o. male  Chief Complaint  Patient presents with  . Diabetes   doing well with diabetes noted low blood sugar spells has lost 12 pounds with adding Jardiance and doing better with diet and stress. Is changing jobs started work from [employer] and doing well. Blood pressure good control no side effects from medications Nerves anxiety been followed by psychiatry and doing okay Aching multiple vitamins specially made for following gastric bypass surgery in doing okay. Taking testosterone without problems injection once every 2 weeks.  Relevant past medical, surgical, family and social history reviewed and updated as indicated. Interim medical history since our last visit reviewed. Allergies and medications reviewed and updated.  Review of Systems  Constitutional: Negative.   Respiratory: Negative.   Cardiovascular: Negative.     Per HPI unless specifically indicated above     Objective:    BP 118/70 (BP Location: Left Arm, Patient Position: Sitting, Cuff Size: Normal)   Pulse 91   Temp 97.7 F (36.5 C)   Ht 5\' 11"  (1.803 m)   Wt 235 lb (106.6 kg)   SpO2 99%   BMI 32.78 kg/m   Wt Readings from Last 3 Encounters:  01/10/16 235 lb (106.6 kg)  08/16/15 247 lb (112 kg)  04/26/15 242 lb (109.8 kg)    Physical Exam  Constitutional: He is oriented to person, place, and time. He appears well-developed and well-nourished. No distress.  HENT:  Head: Normocephalic and atraumatic.  Right Ear: Hearing normal.  Left Ear: Hearing normal.  Nose: Nose normal.  Eyes: Conjunctivae and lids are normal. Right eye exhibits no discharge. Left eye exhibits no discharge. No  scleral icterus.  Cardiovascular: Normal rate, regular rhythm and normal heart sounds.   Pulmonary/Chest: Effort normal and breath sounds normal. No respiratory distress.  Musculoskeletal: Normal range of motion.  Neurological: He is alert and oriented to person, place, and time.  Skin: Skin is intact. No rash noted.  Psychiatric: He has a normal mood and affect. His speech is normal and behavior is normal. Judgment and thought content normal. Cognition and memory are normal.    Results for orders placed or performed in visit on 08/16/15  Comprehensive metabolic panel  Result Value Ref Range   Glucose 226 (H) 65 - 99 mg/dL   BUN 15 6 - 24 mg/dL   Creatinine, Ser 8.461.17 0.76 - 1.27 mg/dL   GFR calc non Af Amer 69 >59 mL/min/1.73   GFR calc Af Amer 80 >59 mL/min/1.73   BUN/Creatinine Ratio 13 9 - 20   Sodium 136 134 - 144 mmol/L   Potassium 4.3 3.5 - 5.2 mmol/L   Chloride 92 (L) 96 - 106 mmol/L   CO2 24 18 - 29 mmol/L   Calcium 9.1 8.7 - 10.2 mg/dL   Total Protein 6.8 6.0 - 8.5 g/dL   Albumin 4.2 3.5 - 5.5 g/dL   Globulin, Total 2.6 1.5 - 4.5 g/dL   Albumin/Globulin Ratio 1.6 1.2 - 2.2   Bilirubin Total 0.2 0.0 - 1.2 mg/dL   Alkaline Phosphatase  115 39 - 117 IU/L   AST 19 0 - 40 IU/L   ALT 22 0 - 44 IU/L  Bayer DCA Hb A1c Waived  Result Value Ref Range   Bayer DCA Hb A1c Waived 8.8 (H) <7.0 %  Lipid panel  Result Value Ref Range   Cholesterol, Total 184 100 - 199 mg/dL   Triglycerides 604246 (H) 0 - 149 mg/dL   HDL 38 (L) >54>39 mg/dL   VLDL Cholesterol Cal 49 (H) 5 - 40 mg/dL   LDL Calculated 97 0 - 99 mg/dL   Chol/HDL Ratio 4.8 0.0 - 5.0 ratio units  CBC with Differential/Platelet  Result Value Ref Range   WBC 8.4 3.4 - 10.8 x10E3/uL   RBC 4.72 4.14 - 5.80 x10E6/uL   Hemoglobin 13.8 12.6 - 17.7 g/dL   Hematocrit 09.842.7 11.937.5 - 51.0 %   MCV 91 79 - 97 fL   MCH 29.2 26.6 - 33.0 pg   MCHC 32.3 31.5 - 35.7 g/dL   RDW 14.713.0 82.912.3 - 56.215.4 %   Platelets 292 150 - 379 x10E3/uL    Neutrophils 68 %   Lymphs 19 %   Monocytes 12 %   Eos 1 %   Basos 0 %   Neutrophils Absolute 5.7 1.4 - 7.0 x10E3/uL   Lymphocytes Absolute 1.6 0.7 - 3.1 x10E3/uL   Monocytes Absolute 1.0 (H) 0.1 - 0.9 x10E3/uL   EOS (ABSOLUTE) 0.1 0.0 - 0.4 x10E3/uL   Basophils Absolute 0.0 0.0 - 0.2 x10E3/uL   Immature Granulocytes 0 %   Immature Grans (Abs) 0.0 0.0 - 0.1 x10E3/uL  TSH  Result Value Ref Range   TSH 1.450 0.450 - 4.500 uIU/mL  Urinalysis, Routine w reflex microscopic ([facility])  Result Value Ref Range   Specific Gravity, UA 1.020 1.005 - 1.030   pH, UA 5.5 5.0 - 7.5   Color, UA Yellow Yellow   Appearance Ur Clear Clear   Leukocytes, UA Negative Negative   Protein, UA Negative Negative/Trace   Glucose, UA 3+ (A) Negative   Ketones, UA Negative Negative   RBC, UA Negative Negative   Bilirubin, UA Negative Negative   Urobilinogen, Ur 0.2 0.2 - 1.0 mg/dL   Nitrite, UA Negative Negative  PSA  Result Value Ref Range   Prostate Specific Ag, Serum 3.0 0.0 - 4.0 ng/mL  HCV Antibody  Result Value Ref Range   Hep C Virus Ab <0.1 0.0 - 0.9 s/co ratio      Assessment & Plan:   Problem List Items Addressed This Visit      Cardiovascular and Mediastinum   Hypertension    The current medical regimen is effective;  continue present plan and medications.         Endocrine   Diabetes mellitus without complication (HCC) - Primary    The current medical regimen is effective;  continue present plan and medications.       Relevant Medications   empagliflozin (JARDIANCE) 25 MG TABS tablet   Other Relevant Orders   Bayer DCA Hb A1c Waived   Hypogonadism in male    The current medical regimen is effective;  continue present plan and medications.        Other Visit Diagnoses   None.      Follow up plan: Return in about 3 months (around 04/11/2016) for Hemoglobin A1c, BMP, CBC, testosterone, PSA.

## 2016-01-10 NOTE — Assessment & Plan Note (Signed)
The current medical regimen is effective;  continue present plan and medications.  

## 2016-04-11 ENCOUNTER — Ambulatory Visit (INDEPENDENT_AMBULATORY_CARE_PROVIDER_SITE_OTHER): Payer: 59 | Admitting: Family Medicine

## 2016-04-11 ENCOUNTER — Encounter: Payer: Self-pay | Admitting: Family Medicine

## 2016-04-11 VITALS — BP 128/78 | HR 92 | Temp 97.7°F | Wt 239.0 lb

## 2016-04-11 DIAGNOSIS — E291 Testicular hypofunction: Secondary | ICD-10-CM | POA: Diagnosis not present

## 2016-04-11 DIAGNOSIS — E119 Type 2 diabetes mellitus without complications: Secondary | ICD-10-CM

## 2016-04-11 DIAGNOSIS — Z23 Encounter for immunization: Secondary | ICD-10-CM

## 2016-04-11 DIAGNOSIS — F3181 Bipolar II disorder: Secondary | ICD-10-CM

## 2016-04-11 DIAGNOSIS — I10 Essential (primary) hypertension: Secondary | ICD-10-CM | POA: Diagnosis not present

## 2016-04-11 LAB — BAYER DCA HB A1C WAIVED: HB A1C: 7.6 % — AB (ref <7.0)

## 2016-04-11 LAB — MICROALBUMIN, URINE WAIVED
CREATININE, URINE WAIVED: 100 mg/dL (ref 10–300)
MICROALB, UR WAIVED: 80 mg/L — AB (ref 0–19)

## 2016-04-11 MED ORDER — EXENATIDE ER 2 MG ~~LOC~~ PEN: 2.0000 mg | PEN_INJECTOR | SUBCUTANEOUS | 12 refills | Status: DC

## 2016-04-11 NOTE — Assessment & Plan Note (Signed)
The current medical regimen is effective;  continue present plan and medications.  

## 2016-04-11 NOTE — Assessment & Plan Note (Signed)
Poor control of diabetes reviewed will start Bydureon injection once weekly

## 2016-04-11 NOTE — Progress Notes (Signed)
BP 128/78 (BP Location: Left Arm)   Pulse 92   Temp 97.7 F (36.5 C)   Wt 239 lb (108.4 kg)   SpO2 96%   BMI 33.33 kg/m    Subjective:    Patient ID: Derek Garza, male    DOB: Mar 27, 1961, 55 y.o.   MRN: [id number]  HPI: Derek Garza is a 55 y.o. male  Chief Complaint  Patient presents with  . Diabetes    patient will call and schedule dm eye exam  . Hypertension  . Low Testosterone   Patient follow-up doing well taking medications faithfully with no issues doing testosterone without problems and taking blood pressure medicines without problems. Relevant past medical, surgical, family and social history reviewed and updated as indicated. Interim medical history since our last visit reviewed. Allergies and medications reviewed and updated.  Review of Systems  Constitutional: Negative.   Respiratory: Negative.   Cardiovascular: Negative.     Per HPI unless specifically indicated above     Objective:    BP 128/78 (BP Location: Left Arm)   Pulse 92   Temp 97.7 F (36.5 C)   Wt 239 lb (108.4 kg)   SpO2 96%   BMI 33.33 kg/m   Wt Readings from Last 3 Encounters:  04/11/16 239 lb (108.4 kg)  01/10/16 235 lb (106.6 kg)  08/16/15 247 lb (112 kg)    Physical Exam  Constitutional: He is oriented to person, place, and time. He appears well-developed and well-nourished. No distress.  HENT:  Head: Normocephalic and atraumatic.  Right Ear: Hearing normal.  Left Ear: Hearing normal.  Nose: Nose normal.  Eyes: Conjunctivae and lids are normal. Right eye exhibits no discharge. Left eye exhibits no discharge. No scleral icterus.  Cardiovascular: Normal rate, regular rhythm and normal heart sounds.   Pulmonary/Chest: Effort normal and breath sounds normal. No respiratory distress.  Musculoskeletal: Normal range of motion.  Neurological: He is alert and oriented to person, place, and time.  Skin: Skin is intact. No rash noted.  Psychiatric: He has a normal mood and  affect. His speech is normal and behavior is normal. Judgment and thought content normal. Cognition and memory are normal.    Results for orders placed or performed in visit on 01/10/16  Hemoglobin A1c  Result Value Ref Range   Hemoglobin A1C 7.3       Assessment & Plan:   Problem List Items Addressed This Visit      Cardiovascular and Mediastinum   Hypertension    The current medical regimen is effective;  continue present plan and medications.       Relevant Orders   Basic metabolic panel   CBC with Differential/Platelet     Endocrine   Diabetes mellitus without complication (HCC) - Primary    Poor control of diabetes reviewed will start Bydureon injection once weekly      Relevant Medications   Exenatide ER 2 MG PEN   Other Relevant Orders   Bayer DCA Hb A1c Waived   Microalbumin, Urine Waived   Pneumococcal polysaccharide vaccine 23-valent greater than or equal to 2yo subcutaneous/IM (Completed)   Hypogonadism in male   Relevant Orders   PSA   Testosterone, Free, Total, SHBG     Other   Bipolar 2 disorder (HCC)    The current medical regimen is effective;  continue present plan and medications.        Other Visit Diagnoses    Needs flu shot  Encounter for immunization       Relevant Orders   Bayer DCA Hb A1c Waived   Basic metabolic panel   CBC with Differential/Platelet   PSA   Testosterone, Free, Total, SHBG   Microalbumin, Urine Waived   Flu Vaccine QUAD 36+ mos IM (Completed)   Pneumococcal polysaccharide vaccine 23-valent greater than or equal to 2yo subcutaneous/IM (Completed)   Need for pneumococcal vaccination       Relevant Orders   Pneumococcal polysaccharide vaccine 23-valent greater than or equal to 2yo subcutaneous/IM (Completed)       Follow up plan: Return in about 3 months (around 07/12/2016) for Hemoglobin A1c.

## 2016-04-13 ENCOUNTER — Encounter: Payer: Self-pay | Admitting: Family Medicine

## 2016-04-13 LAB — CBC WITH DIFFERENTIAL/PLATELET
BASOS ABS: 0 10*3/uL (ref 0.0–0.2)
Basos: 0 %
EOS (ABSOLUTE): 0 10*3/uL (ref 0.0–0.4)
Eos: 0 %
HEMOGLOBIN: 16 g/dL (ref 12.6–17.7)
Hematocrit: 45.9 % (ref 37.5–51.0)
IMMATURE GRANS (ABS): 0 10*3/uL (ref 0.0–0.1)
IMMATURE GRANULOCYTES: 0 %
LYMPHS: 36 %
Lymphocytes Absolute: 2.5 10*3/uL (ref 0.7–3.1)
MCH: 31.4 pg (ref 26.6–33.0)
MCHC: 34.9 g/dL (ref 31.5–35.7)
MCV: 90 fL (ref 79–97)
MONOCYTES: 18 %
Monocytes Absolute: 1.2 10*3/uL — ABNORMAL HIGH (ref 0.1–0.9)
NEUTROS ABS: 3.3 10*3/uL (ref 1.4–7.0)
Neutrophils: 46 %
Platelets: 293 10*3/uL (ref 150–379)
RBC: 5.09 x10E6/uL (ref 4.14–5.80)
RDW: 13 % (ref 12.3–15.4)
WBC: 7.1 10*3/uL (ref 3.4–10.8)

## 2016-04-13 LAB — BASIC METABOLIC PANEL
BUN/Creatinine Ratio: 15 (ref 9–20)
BUN: 16 mg/dL (ref 6–24)
CALCIUM: 9.5 mg/dL (ref 8.7–10.2)
CHLORIDE: 92 mmol/L — AB (ref 96–106)
CO2: 25 mmol/L (ref 18–29)
Creatinine, Ser: 1.08 mg/dL (ref 0.76–1.27)
GFR calc non Af Amer: 76 mL/min/{1.73_m2} (ref >59)
GFR, EST AFRICAN AMERICAN: 88 mL/min/{1.73_m2} (ref >59)
Glucose: 110 mg/dL — ABNORMAL HIGH (ref 65–99)
Potassium: 4.4 mmol/L (ref 3.5–5.2)
Sodium: 136 mmol/L (ref 134–144)

## 2016-04-13 LAB — TESTOSTERONE, FREE, TOTAL, SHBG
SEX HORMONE BINDING: 40.3 nmol/L (ref 19.3–76.4)
TESTOSTERONE FREE: 2.6 pg/mL — AB (ref 7.2–24.0)
Testosterone: 189 ng/dL — ABNORMAL LOW (ref 264–916)

## 2016-04-13 LAB — PSA: PROSTATE SPECIFIC AG, SERUM: 1 ng/mL (ref 0.0–4.0)

## 2016-07-11 ENCOUNTER — Ambulatory Visit (INDEPENDENT_AMBULATORY_CARE_PROVIDER_SITE_OTHER): Payer: 59 | Admitting: Family Medicine

## 2016-07-11 ENCOUNTER — Encounter: Payer: Self-pay | Admitting: Family Medicine

## 2016-07-11 VITALS — BP 138/72 | HR 77 | Temp 98.0°F | Resp 96 | Ht 72.0 in | Wt 241.0 lb

## 2016-07-11 DIAGNOSIS — F3181 Bipolar II disorder: Secondary | ICD-10-CM | POA: Diagnosis not present

## 2016-07-11 DIAGNOSIS — I10 Essential (primary) hypertension: Secondary | ICD-10-CM | POA: Diagnosis not present

## 2016-07-11 DIAGNOSIS — E119 Type 2 diabetes mellitus without complications: Secondary | ICD-10-CM | POA: Diagnosis not present

## 2016-07-11 DIAGNOSIS — E291 Testicular hypofunction: Secondary | ICD-10-CM | POA: Diagnosis not present

## 2016-07-11 LAB — BAYER DCA HB A1C WAIVED: HB A1C (BAYER DCA - WAIVED): 6.6 % (ref <7.0)

## 2016-07-11 MED ORDER — TADALAFIL 20 MG PO TABS: 20.0000 mg | ORAL_TABLET | Freq: Every day | ORAL | 4 refills | Status: DC | PRN

## 2016-07-11 NOTE — Addendum Note (Signed)
Addended byVonita Moss: Berish Bohman on: 07/11/2016 09:44 AM   Modules accepted: Orders

## 2016-07-11 NOTE — Assessment & Plan Note (Signed)
The current medical regimen is effective;  continue present plan and medications.  

## 2016-07-11 NOTE — Assessment & Plan Note (Signed)
Stable followed by psychiatry 

## 2016-07-11 NOTE — Addendum Note (Signed)
Addended by: Sheilah MinsFOX, JADA A on: 07/11/2016 09:38 AM   Modules accepted: Orders

## 2016-07-11 NOTE — Progress Notes (Signed)
BP 138/72 (BP Location: Left Arm)   Pulse 77   Temp 98 F (36.7 C) (Oral)   Resp (!) 96   Ht 6' (1.829 m)   Wt 241 lb (109.3 kg)   BMI 32.69 kg/m    Subjective:    Patient ID: Derek Garza, male    DOB: 02-Dec-1960, 56 y.o.   MRN: [id number]  HPI: Derek Garza is a 56 y.o. male  Chief Complaint  Patient presents with  . Follow-up  . Diabetes  . Nausea   Patient all in all prior to getting nausea today has been doing well. No complaints from medications has been able to lose weight maybe has some weight gain but wearing heavy jacket. Nausea just started several hours ago no throwing up but feels like he might. Otherwise stable with medications. Nerves been stable from psychiatry. Relevant past medical, surgical, family and social history reviewed and updated as indicated. Interim medical history since our last visit reviewed. Allergies and medications reviewed and updated.  Review of Systems  Constitutional: Negative.   Respiratory: Negative.   Cardiovascular: Negative.   Gastrointestinal: Positive for nausea. Negative for abdominal pain, blood in stool, constipation, diarrhea and vomiting.    Per HPI unless specifically indicated above     Objective:    BP 138/72 (BP Location: Left Arm)   Pulse 77   Temp 98 F (36.7 C) (Oral)   Resp (!) 96   Ht 6' (1.829 m)   Wt 241 lb (109.3 kg)   BMI 32.69 kg/m   Wt Readings from Last 3 Encounters:  07/11/16 241 lb (109.3 kg)  04/11/16 239 lb (108.4 kg)  01/10/16 235 lb (106.6 kg)    Physical Exam  Constitutional: He is oriented to person, place, and time. He appears well-developed and well-nourished. No distress.  HENT:  Head: Normocephalic and atraumatic.  Right Ear: Hearing normal.  Left Ear: Hearing normal.  Nose: Nose normal.  Eyes: Conjunctivae and lids are normal. Right eye exhibits no discharge. Left eye exhibits no discharge. No scleral icterus.  Cardiovascular: Normal rate, regular rhythm and normal  heart sounds.   Pulmonary/Chest: Effort normal and breath sounds normal. No respiratory distress.  Abdominal: Soft. Bowel sounds are normal. There is no tenderness. There is no rebound and no guarding.  Musculoskeletal: Normal range of motion.  Neurological: He is alert and oriented to person, place, and time.  Skin: Skin is intact. No rash noted.  Psychiatric: He has a normal mood and affect. His speech is normal and behavior is normal. Judgment and thought content normal. Cognition and memory are normal.    Results for orders placed or performed in visit on 04/11/16  Bayer DCA Hb A1c Waived  Result Value Ref Range   Bayer DCA Hb A1c Waived 7.6 (H) <7.0 %  Basic metabolic panel  Result Value Ref Range   Glucose 110 (H) 65 - 99 mg/dL   BUN 16 6 - 24 mg/dL   Creatinine, Ser 0.861.08 0.76 - 1.27 mg/dL   GFR calc non Af Amer 76 >59 mL/min/1.73   GFR calc Af Amer 88 >59 mL/min/1.73   BUN/Creatinine Ratio 15 9 - 20   Sodium 136 134 - 144 mmol/L   Potassium 4.4 3.5 - 5.2 mmol/L   Chloride 92 (L) 96 - 106 mmol/L   CO2 25 18 - 29 mmol/L   Calcium 9.5 8.7 - 10.2 mg/dL  CBC with Differential/Platelet  Result Value Ref Range   WBC 7.1 3.4 -  10.8 x10E3/uL   RBC 5.09 4.14 - 5.80 x10E6/uL   Hemoglobin 16.0 12.6 - 17.7 g/dL   Hematocrit 16.1 09.6 - 51.0 %   MCV 90 79 - 97 fL   MCH 31.4 26.6 - 33.0 pg   MCHC 34.9 31.5 - 35.7 g/dL   RDW 04.5 40.9 - 81.1 %   Platelets 293 150 - 379 x10E3/uL   Neutrophils 46 Not Estab. %   Lymphs 36 Not Estab. %   Monocytes 18 Not Estab. %   Eos 0 Not Estab. %   Basos 0 Not Estab. %   Neutrophils Absolute 3.3 1.4 - 7.0 x10E3/uL   Lymphocytes Absolute 2.5 0.7 - 3.1 x10E3/uL   Monocytes Absolute 1.2 (H) 0.1 - 0.9 x10E3/uL   EOS (ABSOLUTE) 0.0 0.0 - 0.4 x10E3/uL   Basophils Absolute 0.0 0.0 - 0.2 x10E3/uL   Immature Granulocytes 0 Not Estab. %   Immature Grans (Abs) 0.0 0.0 - 0.1 x10E3/uL  PSA  Result Value Ref Range   Prostate Specific Ag, Serum 1.0 0.0 -  4.0 ng/mL  Testosterone, Free, Total, SHBG  Result Value Ref Range   Testosterone 189 (L) 264 - 916 ng/dL   Testosterone, Free 2.6 (L) 7.2 - 24.0 pg/mL   Sex Hormone Binding 40.3 19.3 - 76.4 nmol/L  Microalbumin, Urine Waived  Result Value Ref Range   Microalb, Ur Waived 80 (H) 0 - 19 mg/L   Creatinine, Urine Waived 100 10 - 300 mg/dL   Microalb/Creat Ratio 30-300 (H) <30 mg/g      Assessment & Plan:   Problem List Items Addressed This Visit      Cardiovascular and Mediastinum   Hypertension    The current medical regimen is effective;  continue present plan and medications.       Relevant Orders   Bayer DCA Hb A1c Waived     Endocrine   Diabetes mellitus without complication (HCC) - Primary    The current medical regimen is effective;  continue present plan and medications.       Relevant Orders   Bayer DCA Hb A1c Waived   Hypogonadism in male    Not interested in medication currently        Other   Bipolar 2 disorder (HCC)    Stable followed by psychiatry         Physical exam after March 20 and diabetes check in 3 months Follow up plan: Return in about 3 months (around 10/08/2016) for Hemoglobin A1c.

## 2016-07-11 NOTE — Assessment & Plan Note (Signed)
Not interested in medication currently

## 2016-07-17 ENCOUNTER — Other Ambulatory Visit: Payer: Self-pay | Admitting: Family Medicine

## 2016-08-23 ENCOUNTER — Other Ambulatory Visit: Payer: Self-pay | Admitting: Family Medicine

## 2016-08-23 DIAGNOSIS — I10 Essential (primary) hypertension: Secondary | ICD-10-CM

## 2016-08-24 ENCOUNTER — Other Ambulatory Visit: Payer: Self-pay | Admitting: Family Medicine

## 2016-08-24 DIAGNOSIS — R609 Edema, unspecified: Secondary | ICD-10-CM

## 2016-08-31 ENCOUNTER — Ambulatory Visit (INDEPENDENT_AMBULATORY_CARE_PROVIDER_SITE_OTHER): Payer: 59 | Admitting: Family Medicine

## 2016-08-31 ENCOUNTER — Encounter: Payer: Self-pay | Admitting: Family Medicine

## 2016-08-31 VITALS — BP 134/72 | HR 93 | Ht 71.38 in | Wt 242.0 lb

## 2016-08-31 DIAGNOSIS — E119 Type 2 diabetes mellitus without complications: Secondary | ICD-10-CM

## 2016-08-31 DIAGNOSIS — Z125 Encounter for screening for malignant neoplasm of prostate: Secondary | ICD-10-CM | POA: Diagnosis not present

## 2016-08-31 DIAGNOSIS — Z13220 Encounter for screening for lipoid disorders: Secondary | ICD-10-CM

## 2016-08-31 DIAGNOSIS — Z1329 Encounter for screening for other suspected endocrine disorder: Secondary | ICD-10-CM

## 2016-08-31 DIAGNOSIS — R609 Edema, unspecified: Secondary | ICD-10-CM

## 2016-08-31 DIAGNOSIS — R600 Localized edema: Secondary | ICD-10-CM

## 2016-08-31 DIAGNOSIS — Z0000 Encounter for general adult medical examination without abnormal findings: Secondary | ICD-10-CM

## 2016-08-31 DIAGNOSIS — I10 Essential (primary) hypertension: Secondary | ICD-10-CM | POA: Diagnosis not present

## 2016-08-31 DIAGNOSIS — F3181 Bipolar II disorder: Secondary | ICD-10-CM

## 2016-08-31 DIAGNOSIS — E1142 Type 2 diabetes mellitus with diabetic polyneuropathy: Secondary | ICD-10-CM

## 2016-08-31 DIAGNOSIS — E291 Testicular hypofunction: Secondary | ICD-10-CM

## 2016-08-31 LAB — URINALYSIS, ROUTINE W REFLEX MICROSCOPIC
Bilirubin, UA: NEGATIVE
Leukocytes, UA: NEGATIVE
NITRITE UA: NEGATIVE
PH UA: 6 (ref 5.0–7.5)
Specific Gravity, UA: 1.015 (ref 1.005–1.030)
Urobilinogen, Ur: 0.2 mg/dL (ref 0.2–1.0)

## 2016-08-31 LAB — MICROSCOPIC EXAMINATION: BACTERIA UA: NONE SEEN

## 2016-08-31 MED ORDER — METFORMIN HCL 500 MG PO TABS: 500.0000 mg | ORAL_TABLET | Freq: Two times a day (BID) | ORAL | 4 refills | Status: DC

## 2016-08-31 MED ORDER — EMPAGLIFLOZIN 25 MG PO TABS: 25.0000 mg | ORAL_TABLET | Freq: Every day | ORAL | 4 refills | Status: DC

## 2016-08-31 MED ORDER — GABAPENTIN 600 MG PO TABS: 600.0000 mg | ORAL_TABLET | Freq: Two times a day (BID) | ORAL | 4 refills | Status: DC

## 2016-08-31 MED ORDER — FUROSEMIDE 80 MG PO TABS: 80.0000 mg | ORAL_TABLET | Freq: Every day | ORAL | 4 refills | Status: DC

## 2016-08-31 MED ORDER — BENAZEPRIL HCL 40 MG PO TABS: 40.0000 mg | ORAL_TABLET | Freq: Every day | ORAL | 4 refills | Status: DC

## 2016-08-31 MED ORDER — POTASSIUM CHLORIDE ER 10 MEQ PO TBCR: 10.0000 meq | EXTENDED_RELEASE_TABLET | Freq: Every day | ORAL | 4 refills | Status: DC

## 2016-08-31 NOTE — Assessment & Plan Note (Signed)
The current medical regimen is effective;  continue present plan and medications.  

## 2016-08-31 NOTE — Assessment & Plan Note (Signed)
Not interested in treatment

## 2016-08-31 NOTE — Progress Notes (Signed)
BP 134/72 (BP Location: Left Arm)   Pulse 93   Ht 5' 11.38" (1.813 m)   Wt 242 lb (109.8 kg)   SpO2 96%   BMI 33.40 kg/m    Subjective:    Patient ID: Derek Garza, male    DOB: [birth year], 56 y.o.   MRN: [id number]  HPI: Derek Garza is a 56 y.o. male  Chief Complaint  Patient presents with  . Annual Exam   Patient all in all doing very well. No complaints diabetes good control no low blood sugar spells. Blood pressure doing well no complaints from medications. Discussed testosterone replacement and options patient electing for no testosterone replacement at this time. Nerves stable on medications no complaints and work going well Relevant past medical, surgical, family and social history reviewed and updated as indicated. Interim medical history since our last visit reviewed. Allergies and medications reviewed and updated.  Review of Systems  Constitutional: Negative.   HENT: Negative.   Eyes: Negative.   Respiratory: Negative.   Cardiovascular: Negative.   Gastrointestinal: Negative.   Endocrine: Negative.   Genitourinary: Negative.   Musculoskeletal: Negative.   Skin: Negative.   Allergic/Immunologic: Negative.   Neurological: Negative.   Hematological: Negative.   Psychiatric/Behavioral: Negative.     Per HPI unless specifically indicated above     Objective:    BP 134/72 (BP Location: Left Arm)   Pulse 93   Ht 5' 11.38" (1.813 m)   Wt 242 lb (109.8 kg)   SpO2 96%   BMI 33.40 kg/m   Wt Readings from Last 3 Encounters:  08/31/16 242 lb (109.8 kg)  07/11/16 241 lb (109.3 kg)  04/11/16 239 lb (108.4 kg)    Physical Exam  Constitutional: He is oriented to person, place, and time. He appears well-developed and well-nourished.  HENT:  Head: Normocephalic and atraumatic.  Right Ear: External ear normal.  Left Ear: External ear normal.  Eyes: Conjunctivae and EOM are normal. Pupils are equal, round, and reactive to light.  Neck: Normal range of  motion. Neck supple.  Cardiovascular: Normal rate, regular rhythm, normal heart sounds and intact distal pulses.   Pulmonary/Chest: Effort normal and breath sounds normal.  Abdominal: Soft. Bowel sounds are normal. There is no splenomegaly or hepatomegaly.  Genitourinary: Rectum normal, prostate normal and penis normal.  Musculoskeletal: Normal range of motion.  Neurological: He is alert and oriented to person, place, and time. He has normal reflexes.  Skin: No rash noted. No erythema.  Psychiatric: He has a normal mood and affect. His behavior is normal. Judgment and thought content normal.    Results for orders placed or performed in visit on 07/11/16  Bayer DCA Hb A1c Waived  Result Value Ref Range   Bayer DCA Hb A1c Waived 6.6 <7.0 %      Assessment & Plan:   Problem List Items Addressed This Visit      Cardiovascular and Mediastinum   Hypertension    The current medical regimen is effective;  continue present plan and medications.       Relevant Medications   benazepril (LOTENSIN) 40 MG tablet   furosemide (LASIX) 80 MG tablet   potassium chloride (K-DUR) 10 MEQ tablet   Other Relevant Orders   CBC with Differential/Platelet   Comprehensive metabolic panel   Urinalysis, Routine w reflex microscopic     Endocrine   DM type 2 with diabetic peripheral neuropathy (HCC)    The current medical regimen is effective;  continue present  plan and medications.       Relevant Medications   benazepril (LOTENSIN) 40 MG tablet   empagliflozin (JARDIANCE) 25 MG TABS tablet   gabapentin (NEURONTIN) 600 MG tablet   metFORMIN (GLUCOPHAGE) 500 MG tablet   Hypogonadism in male    Not interested in treatment        Other   Bipolar 2 disorder (HCC)    The current medical regimen is effective;  continue present plan and medications.       Peripheral edema    resolved      Relevant Medications   furosemide (LASIX) 80 MG tablet    Other Visit Diagnoses    Annual physical  exam    -  Primary   Relevant Orders   CBC with Differential/Platelet   Comprehensive metabolic panel   Lipid panel   PSA   TSH   Urinalysis, Routine w reflex microscopic   Prostate cancer screening       Relevant Orders   PSA   Thyroid disorder screen       Relevant Orders   TSH   Screening cholesterol level       Relevant Orders   Lipid panel       Follow up plan: Return in about 2 months (around 10/31/2016) for Hemoglobin A1c.

## 2016-08-31 NOTE — Assessment & Plan Note (Signed)
resolved 

## 2016-09-01 ENCOUNTER — Telehealth: Payer: Self-pay

## 2016-09-01 LAB — COMPREHENSIVE METABOLIC PANEL
A/G RATIO: 1.5 (ref 1.2–2.2)
ALBUMIN: 4 g/dL (ref 3.5–5.5)
ALK PHOS: 76 IU/L (ref 39–117)
ALT: 13 IU/L (ref 0–44)
AST: 12 IU/L (ref 0–40)
BUN / CREAT RATIO: 22 — AB (ref 9–20)
BUN: 20 mg/dL (ref 6–24)
CHLORIDE: 96 mmol/L (ref 96–106)
CO2: 26 mmol/L (ref 18–29)
Calcium: 9.3 mg/dL (ref 8.7–10.2)
Creatinine, Ser: 0.92 mg/dL (ref 0.76–1.27)
GFR calc Af Amer: 106 mL/min/{1.73_m2} (ref >59)
GFR calc non Af Amer: 92 mL/min/{1.73_m2} (ref >59)
GLUCOSE: 111 mg/dL — AB (ref 65–99)
Globulin, Total: 2.6 g/dL (ref 1.5–4.5)
POTASSIUM: 4.5 mmol/L (ref 3.5–5.2)
SODIUM: 137 mmol/L (ref 134–144)
Total Protein: 6.6 g/dL (ref 6.0–8.5)

## 2016-09-01 LAB — CBC WITH DIFFERENTIAL/PLATELET
BASOS ABS: 0 10*3/uL (ref 0.0–0.2)
Basos: 1 %
EOS (ABSOLUTE): 0.1 10*3/uL (ref 0.0–0.4)
Eos: 1 %
Hematocrit: 41.5 % (ref 37.5–51.0)
Hemoglobin: 14.1 g/dL (ref 13.0–17.7)
Immature Grans (Abs): 0 10*3/uL (ref 0.0–0.1)
Immature Granulocytes: 0 %
LYMPHS ABS: 1.8 10*3/uL (ref 0.7–3.1)
Lymphs: 30 %
MCH: 32.1 pg (ref 26.6–33.0)
MCHC: 34 g/dL (ref 31.5–35.7)
MCV: 95 fL (ref 79–97)
MONOS ABS: 1 10*3/uL — AB (ref 0.1–0.9)
Monocytes: 17 %
Neutrophils Absolute: 3.2 10*3/uL (ref 1.4–7.0)
Neutrophils: 51 %
Platelets: 353 10*3/uL (ref 150–379)
RBC: 4.39 x10E6/uL (ref 4.14–5.80)
RDW: 13.3 % (ref 12.3–15.4)
WBC: 6.1 10*3/uL (ref 3.4–10.8)

## 2016-09-01 LAB — LIPID PANEL
CHOL/HDL RATIO: 4.3 ratio (ref 0.0–5.0)
Cholesterol, Total: 155 mg/dL (ref 100–199)
HDL: 36 mg/dL — AB (ref >39)
LDL CALC: 66 mg/dL (ref 0–99)
Triglycerides: 265 mg/dL — ABNORMAL HIGH (ref 0–149)
VLDL CHOLESTEROL CAL: 53 mg/dL — AB (ref 5–40)

## 2016-09-01 LAB — PSA: PROSTATE SPECIFIC AG, SERUM: 1.1 ng/mL (ref 0.0–4.0)

## 2016-09-01 LAB — TSH: TSH: 1.76 u[IU]/mL (ref 0.450–4.500)

## 2016-09-01 NOTE — Telephone Encounter (Signed)
Left message on machine for pt to return call to the office.  

## 2016-09-01 NOTE — Telephone Encounter (Signed)
-----   Message from Steele Sizer, MD sent at 09/01/2016 10:37 AM EDT ----- Call tell normal labs Or send a letter

## 2016-09-04 NOTE — Telephone Encounter (Signed)
Left message on machine for pt to return call to the office.  

## 2016-10-18 ENCOUNTER — Ambulatory Visit: Payer: 59 | Admitting: Family Medicine

## 2016-11-07 ENCOUNTER — Encounter: Payer: Self-pay | Admitting: Family Medicine

## 2016-11-07 ENCOUNTER — Ambulatory Visit (INDEPENDENT_AMBULATORY_CARE_PROVIDER_SITE_OTHER): Payer: 59 | Admitting: Family Medicine

## 2016-11-07 VITALS — BP 126/77 | HR 83 | Wt 237.0 lb

## 2016-11-07 DIAGNOSIS — F3181 Bipolar II disorder: Secondary | ICD-10-CM | POA: Diagnosis not present

## 2016-11-07 DIAGNOSIS — R609 Edema, unspecified: Secondary | ICD-10-CM | POA: Diagnosis not present

## 2016-11-07 DIAGNOSIS — E1142 Type 2 diabetes mellitus with diabetic polyneuropathy: Secondary | ICD-10-CM | POA: Diagnosis not present

## 2016-11-07 DIAGNOSIS — I10 Essential (primary) hypertension: Secondary | ICD-10-CM

## 2016-11-07 LAB — BAYER DCA HB A1C WAIVED: HB A1C: 6.2 % (ref <7.0)

## 2016-11-07 NOTE — Assessment & Plan Note (Signed)
The current medical regimen is effective;  continue present plan and medications.  

## 2016-11-07 NOTE — Progress Notes (Signed)
BP 126/77   Pulse 83   Wt 237 lb (107.5 kg)   SpO2 95%   BMI 32.71 kg/m    Subjective:    Patient ID: Derek Garza, male    DOB: [birth year], 56 y.o.   MRN: [id number]  HPI: [name] is a 56 y.o. male  Chief Complaint  Patient presents with  . Follow-up   Patient all in all doing well nerves anxiety stable on medications. His been able to lose weight blood sugar coming down very nicely tolerating and taking medications faithfully without problems hemoglobin A1c today is normal. Blood pressure also doing well. Edema controlled with Lasix without problems.  Relevant past medical, surgical, family and social history reviewed and updated as indicated. Interim medical history since our last visit reviewed. Allergies and medications reviewed and updated.  Review of Systems  Constitutional: Negative.   Respiratory: Negative.   Cardiovascular: Negative.     Per HPI unless specifically indicated above     Objective:    BP 126/77   Pulse 83   Wt 237 lb (107.5 kg)   SpO2 95%   BMI 32.71 kg/m   Wt Readings from Last 3 Encounters:  11/07/16 237 lb (107.5 kg)  08/31/16 242 lb (109.8 kg)  07/11/16 241 lb (109.3 kg)    Physical Exam  Constitutional: He is oriented to person, place, and time. He appears well-developed and well-nourished.  HENT:  Head: Normocephalic and atraumatic.  Eyes: Conjunctivae and EOM are normal.  Neck: Normal range of motion.  Cardiovascular: Normal rate, regular rhythm and normal heart sounds.   Pulmonary/Chest: Effort normal and breath sounds normal.  Musculoskeletal: Normal range of motion.  Neurological: He is alert and oriented to person, place, and time.  Skin: No erythema.  Psychiatric: He has a normal mood and affect. His behavior is normal. Judgment and thought content normal.    Results for orders placed or performed in visit on 08/31/16  Microscopic Examination  Result Value Ref Range   WBC, UA 0-5 0 - 5 /hpf   RBC, UA 0-2  0 - 2 /hpf   Epithelial Cells (non renal) CANCELED    Bacteria, UA None seen None seen/Few  CBC with Differential/Platelet  Result Value Ref Range   WBC 6.1 3.4 - 10.8 x10E3/uL   RBC 4.39 4.14 - 5.80 x10E6/uL   Hemoglobin 14.1 13.0 - 17.7 g/dL   Hematocrit 40.9 81.1 - 51.0 %   MCV 95 79 - 97 fL   MCH 32.1 26.6 - 33.0 pg   MCHC 34.0 31.5 - 35.7 g/dL   RDW 91.4 78.2 - 95.6 %   Platelets 353 150 - 379 x10E3/uL   Neutrophils 51 Not Estab. %   Lymphs 30 Not Estab. %   Monocytes 17 Not Estab. %   Eos 1 Not Estab. %   Basos 1 Not Estab. %   Neutrophils Absolute 3.2 1.4 - 7.0 x10E3/uL   Lymphocytes Absolute 1.8 0.7 - 3.1 x10E3/uL   Monocytes Absolute 1.0 (H) 0.1 - 0.9 x10E3/uL   EOS (ABSOLUTE) 0.1 0.0 - 0.4 x10E3/uL   Basophils Absolute 0.0 0.0 - 0.2 x10E3/uL   Immature Granulocytes 0 Not Estab. %   Immature Grans (Abs) 0.0 0.0 - 0.1 x10E3/uL  Comprehensive metabolic panel  Result Value Ref Range   Glucose 111 (H) 65 - 99 mg/dL   BUN 20 6 - 24 mg/dL   Creatinine, Ser 2.13 0.76 - 1.27 mg/dL   GFR calc non Af Amer 92 >  59 mL/min/1.73   GFR calc Af Amer 106 >59 mL/min/1.73   BUN/Creatinine Ratio 22 (H) 9 - 20   Sodium 137 134 - 144 mmol/L   Potassium 4.5 3.5 - 5.2 mmol/L   Chloride 96 96 - 106 mmol/L   CO2 26 18 - 29 mmol/L   Calcium 9.3 8.7 - 10.2 mg/dL   Total Protein 6.6 6.0 - 8.5 g/dL   Albumin 4.0 3.5 - 5.5 g/dL   Globulin, Total 2.6 1.5 - 4.5 g/dL   Albumin/Globulin Ratio 1.5 1.2 - 2.2   Bilirubin Total <0.2 0.0 - 1.2 mg/dL   Alkaline Phosphatase 76 39 - 117 IU/L   AST 12 0 - 40 IU/L   ALT 13 0 - 44 IU/L  Lipid panel  Result Value Ref Range   Cholesterol, Total 155 100 - 199 mg/dL   Triglycerides 161265 (H) 0 - 149 mg/dL   HDL 36 (L) >09>39 mg/dL   VLDL Cholesterol Cal 53 (H) 5 - 40 mg/dL   LDL Calculated 66 0 - 99 mg/dL   Chol/HDL Ratio 4.3 0.0 - 5.0 ratio  PSA  Result Value Ref Range   Prostate Specific Ag, Serum 1.1 0.0 - 4.0 ng/mL  TSH  Result Value Ref Range   TSH  1.760 0.450 - 4.500 uIU/mL  Urinalysis, Routine w reflex microscopic  Result Value Ref Range   Specific Gravity, UA 1.015 1.005 - 1.030   pH, UA 6.0 5.0 - 7.5   Color, UA Yellow Yellow   Appearance Ur Clear Clear   Leukocytes, UA Negative Negative   Protein, UA Trace (A) Negative/Trace   Glucose, UA 2+ (A) Negative   Ketones, UA Trace (A) Negative   RBC, UA Trace (A) Negative   Bilirubin, UA Negative Negative   Urobilinogen, Ur 0.2 0.2 - 1.0 mg/dL   Nitrite, UA Negative Negative   Microscopic Examination See below:       Assessment & Plan:   Problem List Items Addressed This Visit      Cardiovascular and Mediastinum   Hypertension - Primary    The current medical regimen is effective;  continue present plan and medications.       Relevant Orders   Bayer DCA Hb A1c Waived     Endocrine   DM type 2 with diabetic peripheral neuropathy (HCC)    The current medical regimen is effective;  continue present plan and medications.       Relevant Orders   Bayer DCA Hb A1c Waived     Other   Bipolar 2 disorder (HCC)    The current medical regimen is effective;  continue present plan and medications.       Peripheral edema    The current medical regimen is effective;  continue present plan and medications.           Follow up plan: Return in about 3 months (around 02/07/2017) for Hemoglobin A1c, BMP,  Lipids, ALT, AST.

## 2016-11-11 ENCOUNTER — Other Ambulatory Visit: Payer: Self-pay | Admitting: Family Medicine

## 2016-11-11 DIAGNOSIS — I10 Essential (primary) hypertension: Secondary | ICD-10-CM

## 2016-11-11 DIAGNOSIS — R609 Edema, unspecified: Secondary | ICD-10-CM

## 2016-11-11 DIAGNOSIS — E119 Type 2 diabetes mellitus without complications: Secondary | ICD-10-CM

## 2016-11-13 ENCOUNTER — Other Ambulatory Visit: Payer: Self-pay | Admitting: Family Medicine

## 2016-11-13 DIAGNOSIS — I10 Essential (primary) hypertension: Secondary | ICD-10-CM

## 2017-01-19 ENCOUNTER — Other Ambulatory Visit: Payer: Self-pay | Admitting: Family Medicine

## 2017-01-19 DIAGNOSIS — R609 Edema, unspecified: Secondary | ICD-10-CM

## 2017-01-19 DIAGNOSIS — E119 Type 2 diabetes mellitus without complications: Secondary | ICD-10-CM

## 2017-01-19 DIAGNOSIS — I10 Essential (primary) hypertension: Secondary | ICD-10-CM

## 2017-03-07 ENCOUNTER — Ambulatory Visit (INDEPENDENT_AMBULATORY_CARE_PROVIDER_SITE_OTHER): Payer: 59 | Admitting: Family Medicine

## 2017-03-07 ENCOUNTER — Encounter: Payer: Self-pay | Admitting: Family Medicine

## 2017-03-07 VITALS — BP 127/76 | HR 88 | Wt 231.0 lb

## 2017-03-07 DIAGNOSIS — Z23 Encounter for immunization: Secondary | ICD-10-CM | POA: Diagnosis not present

## 2017-03-07 DIAGNOSIS — E7800 Pure hypercholesterolemia, unspecified: Secondary | ICD-10-CM | POA: Insufficient documentation

## 2017-03-07 DIAGNOSIS — I10 Essential (primary) hypertension: Secondary | ICD-10-CM

## 2017-03-07 DIAGNOSIS — F3181 Bipolar II disorder: Secondary | ICD-10-CM

## 2017-03-07 DIAGNOSIS — E1142 Type 2 diabetes mellitus with diabetic polyneuropathy: Secondary | ICD-10-CM | POA: Diagnosis not present

## 2017-03-07 LAB — LP+ALT+AST PICCOLO, WAIVED
ALT (SGPT) Piccolo, Waived: 24 U/L (ref 10–47)
AST (SGOT) Piccolo, Waived: 19 U/L (ref 11–38)
CHOL/HDL RATIO PICCOLO,WAIVE: 3.6 mg/dL
Cholesterol Piccolo, Waived: 174 mg/dL (ref <200)
HDL CHOL PICCOLO, WAIVED: 49 mg/dL — AB (ref >59)
LDL CHOL CALC PICCOLO WAIVED: 87 mg/dL (ref <100)
Triglycerides Piccolo,Waived: 191 mg/dL — ABNORMAL HIGH (ref <150)
VLDL CHOL CALC PICCOLO,WAIVE: 38 mg/dL — AB (ref <30)

## 2017-03-07 LAB — BAYER DCA HB A1C WAIVED: HB A1C: 6.1 % (ref <7.0)

## 2017-03-07 NOTE — Assessment & Plan Note (Signed)
Discuss elevated cholesterol previously is been well controlled with diet nutrition reviewed diet nutrition again with patient and he'll do better.

## 2017-03-07 NOTE — Assessment & Plan Note (Signed)
The current medical regimen is effective;  continue present plan and medications.  

## 2017-03-07 NOTE — Progress Notes (Signed)
   BP 127/76   Pulse 88   Wt 231 lb (104.8 kg)   SpO2 92%   BMI 31.88 kg/m    Subjective:    Patient ID: Derek Garza, male    DOB: 1960-12-30, 56 y.o.   MRN: [id number]  HPI: Derek Garza is a 56 y.o. male  Chief Complaint  Patient presents with  . Follow-up  Patient follow-up doing well with diabetes noted low blood sugar spells or issues with medications taken faithfully without problems. Same for blood pressure doing well without side effects and taking faithfully good control. Nerves depression all stable with medications taking without problems or issues and faithfully.   Relevant past medical, surgical, family and social history reviewed and updated as indicated. Interim medical history since our last visit reviewed. Allergies and medications reviewed and updated.  Review of Systems  Constitutional: Negative.   Respiratory: Negative.   Cardiovascular: Negative.     Per HPI unless specifically indicated above     Objective:    BP 127/76   Pulse 88   Wt 231 lb (104.8 kg)   SpO2 92%   BMI 31.88 kg/m   Wt Readings from Last 3 Encounters:  03/07/17 231 lb (104.8 kg)  11/07/16 237 lb (107.5 kg)  08/31/16 242 lb (109.8 kg)    Physical Exam  Constitutional: He is oriented to person, place, and time. He appears well-developed and well-nourished.  HENT:  Head: Normocephalic and atraumatic.  Eyes: Conjunctivae and EOM are normal.  Neck: Normal range of motion.  Cardiovascular: Normal rate, regular rhythm and normal heart sounds.   Pulmonary/Chest: Effort normal and breath sounds normal.  Musculoskeletal: Normal range of motion.  Neurological: He is alert and oriented to person, place, and time.  Skin: No erythema.  Psychiatric: He has a normal mood and affect. His behavior is normal. Judgment and thought content normal.    Results for orders placed or performed in visit on 11/07/16  Bayer DCA Hb A1c Waived  Result Value Ref Range   Bayer DCA Hb A1c  Waived 6.2 <7.0 %      Assessment & Plan:   Problem List Items Addressed This Visit      Cardiovascular and Mediastinum   Hypertension - Primary   Relevant Orders   Basic metabolic panel   Bayer DCA Hb W0J Waived   LP+ALT+AST Piccolo, Waived     Endocrine   DM type 2 with diabetic peripheral neuropathy (HCC)   Relevant Orders   Basic metabolic panel   Bayer DCA Hb W1X Waived   LP+ALT+AST Piccolo, [place]    Other Visit Diagnoses    Needs flu shot           Follow up plan: No Follow-up on file.

## 2017-03-08 ENCOUNTER — Encounter: Payer: Self-pay | Admitting: Family Medicine

## 2017-03-08 LAB — BASIC METABOLIC PANEL
BUN/Creatinine Ratio: 22 — ABNORMAL HIGH (ref 9–20)
BUN: 24 mg/dL (ref 6–24)
CALCIUM: 9.4 mg/dL (ref 8.7–10.2)
CO2: 20 mmol/L (ref 20–29)
CREATININE: 1.1 mg/dL (ref 0.76–1.27)
Chloride: 100 mmol/L (ref 96–106)
GFR calc Af Amer: 86 mL/min/{1.73_m2} (ref >59)
GFR, EST NON AFRICAN AMERICAN: 74 mL/min/{1.73_m2} (ref >59)
Glucose: 90 mg/dL (ref 65–99)
Potassium: 5 mmol/L (ref 3.5–5.2)
SODIUM: 138 mmol/L (ref 134–144)

## 2017-03-09 ENCOUNTER — Other Ambulatory Visit: Payer: Self-pay | Admitting: Family Medicine

## 2017-03-09 DIAGNOSIS — E119 Type 2 diabetes mellitus without complications: Secondary | ICD-10-CM

## 2017-03-09 DIAGNOSIS — I10 Essential (primary) hypertension: Secondary | ICD-10-CM

## 2017-03-09 DIAGNOSIS — R609 Edema, unspecified: Secondary | ICD-10-CM

## 2017-03-12 NOTE — Telephone Encounter (Signed)
Your patient 

## 2017-04-09 ENCOUNTER — Other Ambulatory Visit: Payer: Self-pay | Admitting: Family Medicine

## 2017-04-09 DIAGNOSIS — I10 Essential (primary) hypertension: Secondary | ICD-10-CM

## 2017-04-10 NOTE — Telephone Encounter (Signed)
Your patient 

## 2017-05-15 ENCOUNTER — Telehealth: Payer: Self-pay | Admitting: Family Medicine

## 2017-05-15 DIAGNOSIS — I10 Essential (primary) hypertension: Secondary | ICD-10-CM

## 2017-05-15 MED ORDER — POTASSIUM CHLORIDE ER 10 MEQ PO TBCR: 10.0000 meq | EXTENDED_RELEASE_TABLET | Freq: Every day | ORAL | 2 refills | Status: DC

## 2017-05-15 NOTE — Addendum Note (Signed)
Addended by: Vonita MossRISSMAN, Ayaat Jansma A on: 05/15/2017 01:50 PM   Modules accepted: Orders

## 2017-05-15 NOTE — Telephone Encounter (Signed)
Routing to provider to advise.  

## 2017-05-15 NOTE — Telephone Encounter (Signed)
[facility] needs clarification on pt.'s potassium order. Does the provider mean to switch pt. To the K-Dur, or keep him on the potassium cloride tablets?

## 2017-05-15 NOTE — Telephone Encounter (Signed)
Copied from CRM [id number]. Topic: Quick Communication - See Telephone Encounter >> May 15, 2017 10:36 AM Eston Mouldavis, Kentrel Clevenger B wrote: CRM for notification. See Telephone encounter for:  Rosanne SackKasey  at [facility] approval  for potassium chloride (K-DUR) 10 MEQ tablet  call back  [phone]   ref number [id number] 05/15/17.

## 2017-07-05 LAB — HM DIABETES EYE EXAM

## 2017-07-31 ENCOUNTER — Ambulatory Visit: Payer: Self-pay | Admitting: Unknown Physician Specialty

## 2017-08-01 ENCOUNTER — Other Ambulatory Visit: Payer: Self-pay | Admitting: Family Medicine

## 2017-08-01 DIAGNOSIS — E1142 Type 2 diabetes mellitus with diabetic polyneuropathy: Secondary | ICD-10-CM

## 2017-08-01 NOTE — Telephone Encounter (Signed)
LOV 03/07/17 with Dr. Dossie Arbourrissman / Refill request for Gabapentin

## 2017-08-03 ENCOUNTER — Ambulatory Visit: Payer: Self-pay | Admitting: Unknown Physician Specialty

## 2017-08-07 ENCOUNTER — Ambulatory Visit: Payer: Self-pay | Admitting: Unknown Physician Specialty

## 2017-08-09 ENCOUNTER — Encounter: Payer: Self-pay | Admitting: Family Medicine

## 2017-08-09 ENCOUNTER — Ambulatory Visit: Payer: Managed Care, Other (non HMO) | Admitting: Family Medicine

## 2017-08-09 VITALS — BP 120/74 | HR 84 | Temp 98.3°F | Wt 233.4 lb

## 2017-08-09 DIAGNOSIS — L97511 Non-pressure chronic ulcer of other part of right foot limited to breakdown of skin: Secondary | ICD-10-CM

## 2017-08-09 NOTE — Progress Notes (Signed)
BP 120/74   Pulse 84   Temp 98.3 F (36.8 C) (Oral)   Wt 233 lb 6.4 oz (105.9 kg)   SpO2 95%   BMI 32.21 kg/m    Subjective:    Patient ID: Derek Garza, male    DOB: July 16, 1960, 57 y.o.   MRN: [id number]  HPI: Derek Garza is a 57 y.o. male  Chief Complaint  Patient presents with  . Foot Problem    pt states he thinks he may have an infection on his right foot   Pt here today for right great toe wound that his wife thinks may be infected. Unsure how long it's been there as it did just look like a large callus until a week or two ago when he shaved down the dead skin to find an ulcer underneath. Has not been treating it with anything at home. Mild thin drainage from area, no redness, heat, streaking, fevers, chills, pain. Does have a hx of diabetes, under very good control.   Relevant past medical, surgical, family and social history reviewed and updated as indicated. Interim medical history since our last visit reviewed. Allergies and medications reviewed and updated.  Review of Systems  Per HPI unless specifically indicated above     Objective:    BP 120/74   Pulse 84   Temp 98.3 F (36.8 C) (Oral)   Wt 233 lb 6.4 oz (105.9 kg)   SpO2 95%   BMI 32.21 kg/m   Wt Readings from Last 3 Encounters:  08/09/17 233 lb 6.4 oz (105.9 kg)  03/07/17 231 lb (104.8 kg)  11/07/16 237 lb (107.5 kg)    Physical Exam  Constitutional: He is oriented to person, place, and time. He appears well-developed and well-nourished. No distress.  HENT:  Head: Atraumatic.  Eyes: Conjunctivae are normal. Pupils are equal, round, and reactive to light.  Neck: Normal range of motion. Neck supple.  Cardiovascular: Normal rate, normal heart sounds and intact distal pulses.  Pulmonary/Chest: Effort normal and breath sounds normal. No respiratory distress.  Musculoskeletal: Normal range of motion. He exhibits no edema or tenderness.  Neurological: He is alert and oriented to person, place,  and time. No cranial nerve deficit.  Good sensation to light touch b/l feet  Skin: Skin is warm and dry.  .25 cm ulcer on plantar surface of right great toe,without drainage, redness, tenderness, warmth. Surrounded by significant callusing.   Psychiatric: He has a normal mood and affect. His behavior is normal.  Nursing note and vitals reviewed.   Results for orders placed or performed in visit on 03/07/17  Basic metabolic panel  Result Value Ref Range   Glucose 90 65 - 99 mg/dL   BUN 24 6 - 24 mg/dL   Creatinine, Ser 4.09 0.76 - 1.27 mg/dL   GFR calc non Af Amer 74 >59 mL/min/1.73   GFR calc Af Amer 86 >59 mL/min/1.73   BUN/Creatinine Ratio 22 (H) 9 - 20   Sodium 138 134 - 144 mmol/L   Potassium 5.0 3.5 - 5.2 mmol/L   Chloride 100 96 - 106 mmol/L   CO2 20 20 - 29 mmol/L   Calcium 9.4 8.7 - 10.2 mg/dL  Bayer DCA Hb W1X Waived  Result Value Ref Range   Bayer DCA Hb A1c Waived 6.1 <7.0 %  LP+ALT+AST Piccolo, Waived  Result Value Ref Range   ALT (SGPT) Piccolo, Waived 24 10 - 47 U/L   AST (SGOT) Piccolo, Waived 19 11 - 38 U/L  Cholesterol Piccolo, Waived 174 <200 mg/dL   HDL Chol Piccolo, Waived 49 (L) >59 mg/dL   Triglycerides Piccolo,Waived 191 (H) <150 mg/dL   Chol/HDL Ratio Piccolo,Waive 3.6 mg/dL   LDL Chol Calc Piccolo Waived 87 <100 mg/dL   VLDL Chol Calc Piccolo,Waive 38 (H) <30 mg/dL      Assessment & Plan:   Problem List Items Addressed This Visit    None    Visit Diagnoses    Skin ulcer of right foot, limited to breakdown of skin (HCC)    -  Primary   Does not currently appear infected. Discussed soaks, neosporin, and soft gauze covering to pad friction to area daily. Return precautions reviewed    General foot care reviewed, pt already wearing orthotic inserts. Several areas of callusing on his feet, discussed avoiding wearing shoes that cause these. Signs of infection in toe reviewed, pt knows to call right away with these signs or symptoms for treatment.     Follow up plan: Return for as scheduled.

## 2017-08-12 NOTE — Patient Instructions (Signed)
Follow up as scheduled.  

## 2017-08-26 ENCOUNTER — Other Ambulatory Visit: Payer: Self-pay | Admitting: Family Medicine

## 2017-08-26 DIAGNOSIS — I10 Essential (primary) hypertension: Secondary | ICD-10-CM

## 2017-08-27 ENCOUNTER — Ambulatory Visit: Payer: Self-pay

## 2017-09-04 ENCOUNTER — Encounter: Payer: Managed Care, Other (non HMO) | Admitting: Family Medicine

## 2017-09-17 ENCOUNTER — Other Ambulatory Visit: Payer: Self-pay | Admitting: Family Medicine

## 2017-09-17 DIAGNOSIS — E119 Type 2 diabetes mellitus without complications: Secondary | ICD-10-CM

## 2017-09-17 NOTE — Telephone Encounter (Signed)
Jardiance 25 mg tablet refill request  LOV 03/07/17 by Dr. Dossie Arbourrissman  [facility] [place] - [address] [place]

## 2017-10-08 ENCOUNTER — Other Ambulatory Visit: Payer: Self-pay | Admitting: Family Medicine

## 2017-10-08 DIAGNOSIS — R600 Localized edema: Secondary | ICD-10-CM

## 2017-10-08 DIAGNOSIS — R609 Edema, unspecified: Secondary | ICD-10-CM

## 2017-10-08 DIAGNOSIS — I10 Essential (primary) hypertension: Secondary | ICD-10-CM

## 2017-12-13 ENCOUNTER — Other Ambulatory Visit: Payer: Self-pay | Admitting: Family Medicine

## 2017-12-13 DIAGNOSIS — I10 Essential (primary) hypertension: Secondary | ICD-10-CM

## 2017-12-13 NOTE — Telephone Encounter (Signed)
Potassium Chloride refill Last OV:08/31/16 (CPE); no upcoming CPE Last refill:05/15/17 90 tab/2 refills XBJ:[id number]:[facility] [place], [place] - [id number] [phone] (Phone) [phone] (Fax)

## 2018-01-02 ENCOUNTER — Other Ambulatory Visit: Payer: Self-pay | Admitting: Family Medicine

## 2018-01-02 DIAGNOSIS — E119 Type 2 diabetes mellitus without complications: Secondary | ICD-10-CM

## 2018-01-31 ENCOUNTER — Ambulatory Visit (INDEPENDENT_AMBULATORY_CARE_PROVIDER_SITE_OTHER): Payer: Managed Care, Other (non HMO) | Admitting: Family Medicine

## 2018-01-31 ENCOUNTER — Encounter: Payer: Self-pay | Admitting: Family Medicine

## 2018-01-31 ENCOUNTER — Other Ambulatory Visit: Payer: Self-pay

## 2018-01-31 VITALS — BP 127/79 | HR 83 | Temp 97.5°F | Wt 238.0 lb

## 2018-01-31 DIAGNOSIS — Z114 Encounter for screening for human immunodeficiency virus [HIV]: Secondary | ICD-10-CM

## 2018-01-31 DIAGNOSIS — E1142 Type 2 diabetes mellitus with diabetic polyneuropathy: Secondary | ICD-10-CM

## 2018-01-31 DIAGNOSIS — E11621 Type 2 diabetes mellitus with foot ulcer: Secondary | ICD-10-CM | POA: Diagnosis not present

## 2018-01-31 DIAGNOSIS — L97519 Non-pressure chronic ulcer of other part of right foot with unspecified severity: Secondary | ICD-10-CM | POA: Diagnosis not present

## 2018-01-31 DIAGNOSIS — E119 Type 2 diabetes mellitus without complications: Secondary | ICD-10-CM

## 2018-01-31 DIAGNOSIS — I10 Essential (primary) hypertension: Secondary | ICD-10-CM

## 2018-01-31 DIAGNOSIS — E7800 Pure hypercholesterolemia, unspecified: Secondary | ICD-10-CM

## 2018-01-31 DIAGNOSIS — Z23 Encounter for immunization: Secondary | ICD-10-CM | POA: Diagnosis not present

## 2018-01-31 DIAGNOSIS — L03031 Cellulitis of right toe: Secondary | ICD-10-CM

## 2018-01-31 LAB — BAYER DCA HB A1C WAIVED: HB A1C: 6.6 % (ref <7.0)

## 2018-01-31 MED ORDER — SULFAMETHOXAZOLE-TRIMETHOPRIM 800-160 MG PO TABS: 1.0000 | ORAL_TABLET | Freq: Two times a day (BID) | ORAL | 0 refills | Status: DC

## 2018-01-31 MED ORDER — GABAPENTIN 600 MG PO TABS: 600.0000 mg | ORAL_TABLET | Freq: Two times a day (BID) | ORAL | 0 refills | Status: DC

## 2018-01-31 MED ORDER — METFORMIN HCL 500 MG PO TABS: 500.0000 mg | ORAL_TABLET | Freq: Two times a day (BID) | ORAL | 0 refills | Status: DC

## 2018-01-31 NOTE — Assessment & Plan Note (Signed)
Rechecking levels. Call with any concerns. Continue to monitor.  

## 2018-01-31 NOTE — Patient Instructions (Signed)
You have an appointment scheduled at [facility] September 18th, 2019 at 8:45AM.  Please arrive 15 minutes early to your appointment. [address] [place], [place] [address] [phone]

## 2018-01-31 NOTE — Assessment & Plan Note (Signed)
Has not had a A1c in almost a year.

## 2018-01-31 NOTE — Progress Notes (Signed)
BP 127/79   Pulse 83   Temp (!) 97.5 F (36.4 C) (Oral)   Wt 238 lb (108 kg)   SpO2 99%   BMI 32.84 kg/m    Subjective:    Patient ID: Derek Garza, male    DOB: February 24, 1961, 57 y.o.   MRN: [id number]  HPI: Derek Garza is a 57 y.o. male  Chief Complaint  Patient presents with  . Foot Pain    right foot/ pt states it is red and swollen   Has had a spot on his R great toe that has been there for at least 6 months. He is not sure when it started, but it is not healing. He notes that over the weekend it started hurting, and has become red and swollen. He has been soaking it in salt water.   DIABETES Hypoglycemic episodes:no Polydipsia/polyuria: no Visual disturbance: no Chest pain: no Paresthesias: no Glucose Monitoring: no Taking Insulin?: no Blood Pressure Monitoring: not checking Retinal Examination: Up to Date Foot Exam: Up to Date Diabetic Education: Completed Pneumovax: Up to Date Influenza: Up to Date Aspirin: no  HYPERTENSION / [place] Satisfied with current treatment? yes Duration of hypertension: chronic BP monitoring frequency: not checking BP medication side effects: no Duration of hyperlipidemia: chronic Cholesterol medication side effects: not on anything Cholesterol supplements: none Medication compliance: good compliance Aspirin: no Recent stressors: no Recurrent headaches: no Visual changes: no Palpitations: no Dyspnea: no Chest pain: no Lower extremity edema: no Dizzy/lightheaded: no    Relevant past medical, surgical, family and social history reviewed and updated as indicated. Interim medical history since our last visit reviewed. Allergies and medications reviewed and updated.  Review of Systems  Constitutional: Negative.   Respiratory: Negative.   Cardiovascular: Negative.   Skin: Positive for color change and wound. Negative for pallor and rash.  Psychiatric/Behavioral: Negative.     Per HPI unless specifically  indicated above     Objective:    BP 127/79   Pulse 83   Temp (!) 97.5 F (36.4 C) (Oral)   Wt 238 lb (108 kg)   SpO2 99%   BMI 32.84 kg/m   Wt Readings from Last 3 Encounters:  01/31/18 238 lb (108 kg)  08/09/17 233 lb 6.4 oz (105.9 kg)  03/07/17 231 lb (104.8 kg)    Physical Exam  Constitutional: He is oriented to person, place, and time. He appears well-developed and well-nourished. No distress.  HENT:  Head: Normocephalic and atraumatic.  Right Ear: Hearing normal.  Left Ear: Hearing normal.  Nose: Nose normal.  Eyes: Conjunctivae and lids are normal. Right eye exhibits no discharge. Left eye exhibits no discharge. No scleral icterus.  Cardiovascular: Normal rate, regular rhythm, normal heart sounds and intact distal pulses. Exam reveals no gallop and no friction rub.  No murmur heard. Pulmonary/Chest: Effort normal and breath sounds normal. No stridor. No respiratory distress. He has no wheezes. He has no rales. He exhibits no tenderness.  Musculoskeletal: Normal range of motion.  Neurological: He is alert and oriented to person, place, and time.  Skin: Skin is warm, dry and intact. Capillary refill takes less than 2 seconds. No rash noted. He is not diaphoretic. There is erythema. No pallor.  0.5cm open wound, not draining from R great toe, red, warm, tender to palpation  Psychiatric: He has a normal mood and affect. His speech is normal and behavior is normal. Judgment and thought content normal. Cognition and memory are normal.  Nursing note and vitals  reviewed.   Results for orders placed or performed in visit on 21/97/58  Basic metabolic panel  Result Value Ref Range   Glucose 90 65 - 99 mg/dL   BUN 24 6 - 24 mg/dL   Creatinine, Ser 1.10 0.76 - 1.27 mg/dL   GFR calc non Af Amer 74 >59 mL/min/1.73   GFR calc Af Amer 86 >59 mL/min/1.73   BUN/Creatinine Ratio 22 (H) 9 - 20   Sodium 138 134 - 144 mmol/L   Potassium 5.0 3.5 - 5.2 mmol/L   Chloride 100 96 - 106  mmol/L   CO2 20 20 - 29 mmol/L   Calcium 9.4 8.7 - 10.2 mg/dL  Bayer DCA Hb A1c Waived  Result Value Ref Range   HB A1C (BAYER DCA - WAIVED) 6.1 <7.0 %  LP+ALT+AST Piccolo, Waived  Result Value Ref Range   ALT (SGPT) Piccolo, Waived 24 10 - 47 U/L   AST (SGOT) Piccolo, Waived 19 11 - 38 U/L   Cholesterol Piccolo, Waived 174 <200 mg/dL   HDL Chol Piccolo, Waived 49 (L) >59 mg/dL   Triglycerides Piccolo,Waived 191 (H) <150 mg/dL   Chol/HDL Ratio Piccolo,Waive 3.6 mg/dL   LDL Chol Calc Piccolo Waived 87 <100 mg/dL   VLDL Chol Calc Piccolo,Waive 38 (H) <30 mg/dL      Assessment & Plan:   Problem List Items Addressed This Visit      Cardiovascular and Mediastinum   Hypertension    Under good control on current regimen. Continue to monitor. Call with any concerns. Continue to monitor.       Relevant Orders   Comprehensive metabolic panel     Endocrine   DM type 2 with diabetic peripheral neuropathy ([place])    Has not had a A1c in almost a year.       Relevant Medications   gabapentin (NEURONTIN) 600 MG tablet   metFORMIN (GLUCOPHAGE) 500 MG tablet   Other Relevant Orders   Comprehensive metabolic panel   Bayer DCA Hb A1c Waived     Other   Hypercholesterolemia    Rechecking levels. Call with any concerns. Continue to monitor.       Relevant Orders   Comprehensive metabolic panel   Lipid Panel w/o Chol/HDL Ratio    Other Visit Diagnoses    Diabetic ulcer of right great toe ([place])    -  Primary   Has been going on for at least 9 months with cellulitis. Tdap given today. Will get him into wound care- appointment scheduled for him today. Call with concerns   Relevant Medications   metFORMIN (GLUCOPHAGE) 500 MG tablet   Other Relevant Orders   Tdap vaccine greater than or equal to 7yo IM (Completed)   Ambulatory referral to Wound Clinic   Flu vaccine need       Given today.   Relevant Orders   Flu Vaccine QUAD 36+ mos IM (Completed)   Encounter for screening for HIV        Labs drawn today. Await results.    Relevant Orders   HIV antibody   Cellulitis of toe of right foot       Will treat with bactrim and get him into wound care. Call with any concerns.    Diabetes mellitus without complication ([place])       Relevant Medications   metFORMIN (GLUCOPHAGE) 500 MG tablet       Follow up plan: Return in about 3 months (around 05/02/2018) for DM follow up with  [facility].

## 2018-01-31 NOTE — Assessment & Plan Note (Signed)
Under good control on current regimen. Continue to monitor. Call with any concerns. Continue to monitor.

## 2018-02-01 ENCOUNTER — Encounter: Payer: Self-pay | Admitting: Family Medicine

## 2018-02-01 LAB — COMPREHENSIVE METABOLIC PANEL
ALT: 20 IU/L (ref 0–44)
AST: 14 IU/L (ref 0–40)
Albumin/Globulin Ratio: 1.7 (ref 1.2–2.2)
Albumin: 4.5 g/dL (ref 3.5–5.5)
Alkaline Phosphatase: 97 IU/L (ref 39–117)
BUN/Creatinine Ratio: 16 (ref 9–20)
BUN: 16 mg/dL (ref 6–24)
Bilirubin Total: 0.4 mg/dL (ref 0.0–1.2)
CALCIUM: 9.5 mg/dL (ref 8.7–10.2)
CO2: 22 mmol/L (ref 20–29)
CREATININE: 0.99 mg/dL (ref 0.76–1.27)
Chloride: 99 mmol/L (ref 96–106)
GFR calc Af Amer: 97 mL/min/{1.73_m2} (ref >59)
GFR, EST NON AFRICAN AMERICAN: 84 mL/min/{1.73_m2} (ref >59)
Globulin, Total: 2.7 g/dL (ref 1.5–4.5)
Glucose: 136 mg/dL — ABNORMAL HIGH (ref 65–99)
Potassium: 4.5 mmol/L (ref 3.5–5.2)
Sodium: 138 mmol/L (ref 134–144)
Total Protein: 7.2 g/dL (ref 6.0–8.5)

## 2018-02-01 LAB — LIPID PANEL W/O CHOL/HDL RATIO
Cholesterol, Total: 207 mg/dL — ABNORMAL HIGH (ref 100–199)
HDL: 46 mg/dL (ref >39)
LDL Calculated: 128 mg/dL — ABNORMAL HIGH (ref 0–99)
Triglycerides: 165 mg/dL — ABNORMAL HIGH (ref 0–149)
VLDL CHOLESTEROL CAL: 33 mg/dL (ref 5–40)

## 2018-02-01 LAB — HIV ANTIBODY (ROUTINE TESTING W REFLEX): HIV Screen 4th Generation wRfx: NONREACTIVE

## 2018-02-13 ENCOUNTER — Other Ambulatory Visit (HOSPITAL_BASED_OUTPATIENT_CLINIC_OR_DEPARTMENT_OTHER): Payer: Self-pay | Admitting: Internal Medicine

## 2018-02-13 ENCOUNTER — Ambulatory Visit
Admission: RE | Admit: 2018-02-13 | Discharge: 2018-02-13 | Disposition: A | Discharge status: Home / Self Care | Payer: Managed Care, Other (non HMO) | Source: Ambulatory Visit | Attending: Internal Medicine | Admitting: Internal Medicine

## 2018-02-13 ENCOUNTER — Encounter: Payer: Managed Care, Other (non HMO) | Attending: Internal Medicine | Admitting: Internal Medicine

## 2018-02-13 DIAGNOSIS — Z885 Allergy status to narcotic agent status: Secondary | ICD-10-CM | POA: Diagnosis not present

## 2018-02-13 DIAGNOSIS — L03115 Cellulitis of right lower limb: Secondary | ICD-10-CM | POA: Insufficient documentation

## 2018-02-13 DIAGNOSIS — E11621 Type 2 diabetes mellitus with foot ulcer: Secondary | ICD-10-CM | POA: Diagnosis present

## 2018-02-13 DIAGNOSIS — Z9884 Bariatric surgery status: Secondary | ICD-10-CM | POA: Insufficient documentation

## 2018-02-13 DIAGNOSIS — I10 Essential (primary) hypertension: Secondary | ICD-10-CM | POA: Insufficient documentation

## 2018-02-13 DIAGNOSIS — F319 Bipolar disorder, unspecified: Secondary | ICD-10-CM | POA: Insufficient documentation

## 2018-02-13 DIAGNOSIS — X58XXXA Exposure to other specified factors, initial encounter: Secondary | ICD-10-CM | POA: Diagnosis not present

## 2018-02-13 DIAGNOSIS — M7989 Other specified soft tissue disorders: Secondary | ICD-10-CM | POA: Diagnosis present

## 2018-02-13 DIAGNOSIS — E785 Hyperlipidemia, unspecified: Secondary | ICD-10-CM | POA: Diagnosis not present

## 2018-02-13 DIAGNOSIS — R52 Pain, unspecified: Secondary | ICD-10-CM

## 2018-02-13 DIAGNOSIS — L97511 Non-pressure chronic ulcer of other part of right foot limited to breakdown of skin: Secondary | ICD-10-CM | POA: Insufficient documentation

## 2018-02-13 DIAGNOSIS — S92411A Displaced fracture of proximal phalanx of right great toe, initial encounter for closed fracture: Secondary | ICD-10-CM | POA: Diagnosis not present

## 2018-02-15 DIAGNOSIS — S92403A Displaced unspecified fracture of unspecified great toe, initial encounter for closed fracture: Secondary | ICD-10-CM | POA: Insufficient documentation

## 2018-02-15 NOTE — Progress Notes (Signed)
Derek Garza, Orlandis ([id number]) Visit Report for 02/13/2018 Abuse/Suicide Risk Screen Details Patient Name: Derek Garza, Derek Garza Date of Service: 02/13/2018 8:45 AM Medical Record Number: [id number] Patient Account Number: [id number] Date of Birth/Sex: 06-05-1960 (57 y.o. M) Treating RN: Curtis Sitesorthy, Joanna Primary Care Camar Guyton: Vonita MossRISSMAN, MARK Other Clinician: Referring Merrell Rettinger: Olevia PerchesJOHNSON, MEGAN Treating Billy Turvey/Extender: Altamese CarolinaOBSON, MICHAEL G Weeks in Treatment: 0 Abuse/Suicide Risk Screen Items Answer ABUSE/SUICIDE RISK SCREEN: Has anyone close to you tried to hurt or harm you recentlyo No Do you feel uncomfortable with anyone in your familyo No Has anyone forced you do things that you didnot want to doo No Do you have any thoughts of harming yourselfo No Patient displays signs or symptoms of abuse and/or neglect. No Electronic Signature(s) Signed: 02/13/2018 4:04:54 PM By: Curtis Sitesorthy, Joanna Entered By: Curtis Sitesorthy, Joanna on 02/13/2018 09:05:40 Derek Garza, Lynard ([id number]) -------------------------------------------------------------------------------- Activities of Daily Living Details Patient Name: Derek Garza, Derek Garza Date of Service: 02/13/2018 8:45 AM Medical Record Number: [id number] Patient Account Number: [id number] Date of Birth/Sex: 06-05-1960 (57 y.o. M) Treating RN: Curtis Sitesorthy, Joanna Primary Care Admire Bunnell: Vonita MossRISSMAN, MARK Other Clinician: Referring Egbert Seidel: Olevia PerchesJOHNSON, MEGAN Treating Anajulia Leyendecker/Extender: Altamese CarolinaOBSON, MICHAEL G Weeks in Treatment: 0 Activities of Daily Living Items Answer Activities of Daily Living (Please select one for each item) Drive Automobile Completely Able Take Medications Completely Able Use Telephone Completely Able Care for Appearance Completely Able Use Toilet Completely Able Bath / Shower Completely Able Dress Self Completely Able Feed Self Completely Able Walk Completely Able Get In / Out Bed Completely Able Housework Completely Able Prepare Meals Completely Able Handle  Money Completely Able Shop for Self Completely Able Electronic Signature(s) Signed: 02/13/2018 4:04:54 PM By: Curtis Sitesorthy, Joanna Entered By: Curtis Sitesorthy, Joanna on 02/13/2018 09:06:00 Derek Garza, Derek Garza ([id number]) -------------------------------------------------------------------------------- Education Assessment Details Patient Name: Derek Garza, Derek Garza Date of Service: 02/13/2018 8:45 AM Medical Record Number: [id number] Patient Account Number: [id number] Date of Birth/Sex: 06-05-1960 (57 y.o. M) Treating RN: Curtis Sitesorthy, Joanna Primary Care Dantavious Snowball: Vonita MossRISSMAN, MARK Other Clinician: Referring Laurina Fischl: Olevia PerchesJOHNSON, MEGAN Treating Katrece Roediger/Extender: Altamese CarolinaOBSON, MICHAEL G Weeks in Treatment: 0 Primary Learner Assessed: Patient Learning Preferences/Education Level/Primary Language Learning Preference: Explanation, Demonstration Highest Education Level: College or Above Preferred Language: English Cognitive Barrier Assessment/Beliefs Language Barrier: No Translator Needed: No Memory Deficit: No Emotional Barrier: No Cultural/Religious Beliefs Affecting Medical Care: No Physical Barrier Assessment Impaired Vision: No Impaired Hearing: No Decreased Hand dexterity: No Knowledge/Comprehension Assessment Knowledge Level: Medium Comprehension Level: Medium Ability to understand written Medium instructions: Ability to understand verbal Medium instructions: Motivation Assessment Anxiety Level: Calm Cooperation: Cooperative Education Importance: Acknowledges Need Interest in Health Problems: Asks Questions Perception: Coherent Willingness to Engage in Self- Medium Management Activities: Readiness to Engage in Self- Medium Management Activities: Electronic Signature(s) Signed: 02/13/2018 4:04:54 PM By: Curtis Sitesorthy, Joanna Entered By: Curtis Sitesorthy, Joanna on 02/13/2018 09:06:23 Derek Garza, Takoda ([id number]) -------------------------------------------------------------------------------- Fall Risk Assessment  Details Patient Name: Derek Garza, Derek Garza Date of Service: 02/13/2018 8:45 AM Medical Record Number: [id number] Patient Account Number: [id number] Date of Birth/Sex: 06-05-1960 (57 y.o. M) Treating RN: Curtis Sitesorthy, Joanna Primary Care Altamese Deguire: Vonita MossRISSMAN, MARK Other Clinician: Referring Ariyona Eid: Olevia PerchesJOHNSON, MEGAN Treating Triana Coover/Extender: Altamese CarolinaOBSON, MICHAEL G Weeks in Treatment: 0 Fall Risk Assessment Items Have you had 2 or more falls in the last 12 monthso 0 No Have you had any fall that resulted in injury in the last 12 monthso 0 No FALL RISK ASSESSMENT: History of falling - immediate or within 3 months 0 No Secondary diagnosis 0 No Ambulatory aid None/bed rest/wheelchair/nurse 0 Yes Crutches/cane/walker 0 No Furniture 0 No IV Access/Saline Lock 0 No Gait/Training  Normal/bed rest/immobile 0 Yes Weak 0 No Impaired 0 No Mental Status Oriented to own ability 0 Yes Electronic Signature(s) Signed: 02/13/2018 4:04:54 PM By: Curtis Sites Entered By: Curtis Sites on 02/13/2018 09:06:33 Derek Garza ([id number]) -------------------------------------------------------------------------------- Foot Assessment Details Patient Name: Derek Garza Date of Service: 02/13/2018 8:45 AM Medical Record Number: [id number] Patient Account Number: [id number] Date of Birth/Sex: June 08, 1960 (57 y.o. M) Treating RN: Curtis Sites Primary Care Deaven Barron: Vonita Moss Other Clinician: Referring Darly Massi: Olevia Perches Treating Nyal Schachter/Extender: Altamese [place] in Treatment: 0 Foot Assessment Items Site Locations + = Sensation present, - = Sensation absent, C = Callus, U = Ulcer R = Redness, W = Warmth, M = Maceration, PU = Pre-ulcerative lesion F = Fissure, S = Swelling, D = Dryness Assessment Right: Left: Other Deformity: No No Prior Foot Ulcer: No No Prior Amputation: No No Charcot Joint: No No Ambulatory Status: Ambulatory Without Help Gait: Steady Electronic Signature(s) Signed:  02/13/2018 4:04:54 PM By: Curtis Sites Entered By: Curtis Sites on 02/13/2018 09:29:08 Derek Garza ([id number]) -------------------------------------------------------------------------------- Nutrition Risk Assessment Details Patient Name: Derek Garza Date of Service: 02/13/2018 8:45 AM Medical Record Number: [id number] Patient Account Number: [id number] Date of Birth/Sex: 1960-06-30 (57 y.o. M) Treating RN: Curtis Sites Primary Care Sayde Lish: Vonita Moss Other Clinician: Referring Phuc Kluttz: Olevia Perches Treating Sameka Bagent/Extender: Altamese [place] in Treatment: 0 Height (in): Weight (lbs): Body Mass Index (BMI): Nutrition Risk Assessment Items NUTRITION RISK SCREEN: I have an illness or condition that made me change the kind and/or amount of 0 No food I eat I eat fewer than two meals per day 0 No I eat few fruits and vegetables, or milk products 0 No I have three or more drinks of beer, liquor or wine almost every day 0 No I have tooth or mouth problems that make it hard for me to eat 0 No I don't always have enough money to buy the food I need 0 No I eat alone most of the time 0 No I take three or more different prescribed or over-the-counter drugs a day 1 Yes Without wanting to, I have lost or gained 10 pounds in the last six months 0 No I am not always physically able to shop, cook and/or feed myself 0 No Nutrition Protocols Good Risk Protocol 0 No interventions needed Moderate Risk Protocol Electronic Signature(s) Signed: 02/13/2018 4:04:54 PM By: Curtis Sites Entered By: Curtis Sites on 02/13/2018 09:06:39

## 2018-02-16 NOTE — Progress Notes (Addendum)
Derek Garza, Derek Garza ([id number]) Visit Report for 02/13/2018 Allergy List Details Patient Name: Derek Garza, Derek Garza Date of Service: 02/13/2018 8:45 AM Medical Record Number: [id number] Patient Account Number: [id number] Date of Birth/Sex: Oct 27, 1960 (57 y.o. M) Treating RN: Derek Garza Primary Care Derek Garza: Derek Garza Other Clinician: Referring Derek Garza: Derek Garza Treating Derek Garza/Extender: Derek Garza Weeks in Treatment: 0 Allergies Active Allergies Percocet Allergy Notes Electronic Signature(s) Signed: 02/13/2018 4:04:54 PM By: Derek Garza Entered By: Derek Garza on 02/13/2018 09:05:30 Derek Garza ([id number]) -------------------------------------------------------------------------------- Arrival Information Details Patient Name: Derek Garza Date of Service: 02/13/2018 8:45 AM Medical Record Number: [id number] Patient Account Number: [id number] Date of Birth/Sex: 07-Jul-1960 (57 y.o. M) Treating RN: Derek Garza Primary Care Iya Hamed: Derek Garza Other Clinician: Referring Derek Garza: Derek Garza Treating Derek Garza/Extender: Derek Garza in Treatment: 0 Visit Information Patient Arrived: Ambulatory Arrival Time: 09:02 Accompanied By: wife Transfer Assistance: None Patient Identification Verified: Yes Secondary Verification Process Completed: Yes Patient Has Alerts: Yes Patient Alerts: DMII Electronic Signature(s) Signed: 02/13/2018 4:04:54 PM By: Derek Garza Entered By: Derek Garza on 02/13/2018 09:02:48 Derek Garza ([id number]) -------------------------------------------------------------------------------- Clinic Level of Care Assessment Details Patient Name: Derek Garza Date of Service: 02/13/2018 8:45 AM Medical Record Number: [id number] Patient Account Number: [id number] Date of Birth/Sex: November 05, 1960 (57 y.o. M) Treating RN: Derek Garza Primary Care Tajee Savant: Derek Garza Other Clinician: Referring Keianna Signer:  Derek Garza Treating Breah Joa/Extender: Derek Garza in Treatment: 0 Clinic Level of Care Assessment Items TOOL 1 Quantity Score []  - Use when EandM and Procedure is performed on INITIAL visit 0 ASSESSMENTS - Nursing Assessment / Reassessment X - General Physical Exam (combine w/ comprehensive assessment (listed just below) when 1 20 performed on new pt. evals) X- 1 25 Comprehensive Assessment (HX, ROS, Risk Assessments, Wounds Hx, etc.) ASSESSMENTS - Wound and Skin Assessment / Reassessment []  - Dermatologic / Skin Assessment (not related to wound area) 0 ASSESSMENTS - Ostomy and/or Continence Assessment and Care []  - Incontinence Assessment and Management 0 []  - 0 Ostomy Care Assessment and Management (repouching, etc.) PROCESS - Coordination of Care X - Simple Patient / Family Education for ongoing care 1 15 []  - 0 Complex (extensive) Patient / Family Education for ongoing care []  - 0 Staff obtains Chiropractor, Records, Test Results / Process Orders []  - 0 Staff telephones HHA, Nursing Homes / Clarify orders / etc []  - 0 Routine Transfer to another Facility (non-emergent condition) []  - 0 Routine Hospital Admission (non-emergent condition) X- 1 15 New Admissions / Manufacturing engineer / Ordering NPWT, Apligraf, etc. []  - 0 Emergency Hospital Admission (emergent condition) PROCESS - Special Needs []  - Pediatric / Minor Patient Management 0 []  - 0 Isolation Patient Management []  - 0 Hearing / Language / Visual special needs []  - 0 Assessment of Community assistance (transportation, D/C planning, etc.) []  - 0 Additional assistance / Altered mentation []  - 0 Support Surface(s) Assessment (bed, cushion, seat, etc.) Derek Garza ([id number]) INTERVENTIONS - Miscellaneous []  - External ear exam 0 []  - 0 Patient Transfer (multiple staff / Nurse, adult / Similar devices) []  - 0 Simple Staple / Suture removal (25 or less) []  - 0 Complex Staple / Suture  removal (26 or more) []  - 0 Hypo/Hyperglycemic Management (do not check if billed separately) X- 1 15 Ankle / Brachial Index (ABI) - do not check if billed separately Has the patient been seen at the hospital within the last three years: Yes Total Score: 90 Level Of Care: New/Established - Level 3 Electronic Signature(s) Signed:  02/14/2018 5:07:26 PM By: Derek Garza, BSN, RN, CWS, Kim RN, BSN Entered By: Derek Garza, BSN, RN, CWS, Derek Garza on 02/13/2018 09:52:45 Derek Garza ([id number]) -------------------------------------------------------------------------------- Lower Extremity Assessment Details Patient Name: Derek Garza Date of Service: 02/13/2018 8:45 AM Medical Record Number: [id number] Patient Account Number: [id number] Date of Birth/Sex: May 15, 1961 (57 y.o. M) Treating RN: Derek Garza Primary Care Warda Mcqueary: Derek Garza Other Clinician: Referring Megyn Leng: Derek Garza Treating Mayvis Agudelo/Extender: Derek [place] in Treatment: 0 Edema Assessment Assessed: [Left: No] [Right: No] Edema: [Left: No] [Right: No] Calf Left: Right: Point of Measurement: 34 cm From Medial Instep 40.3 cm 40.7 cm Ankle Left: Right: Point of Measurement: 12 cm From Medial Instep 24.6 cm 24.4 cm Vascular Assessment Pulses: Dorsalis Pedis Palpable: [Left:Yes] [Right:Yes] Doppler Audible: [Left:Yes] [Right:Yes] Posterior Tibial Palpable: [Left:Yes] [Right:Yes] Doppler Audible: [Left:Yes] [Right:Yes] Extremity colors, hair growth, and conditions: Extremity Color: [Left:Normal] [Right:Normal] Hair Growth on Extremity: [Left:Yes] [Right:Yes] Temperature of Extremity: [Left:Warm] [Right:Warm] Capillary Refill: [Left:< 3 seconds] [Right:< 3 seconds] Blood Pressure: Brachial: [Left:126] Dorsalis Pedis: 142 [Left:Dorsalis Pedis: 154] Ankle: Posterior Tibial: 148 [Left:Posterior Tibial: 168 1.17] [Right:1.33] Toe Nail Assessment Left: Right: Thick: Yes Yes Discolored: Yes Yes Deformed: No  No Improper Length and Hygiene: No No Electronic Signature(s) Signed: 02/13/2018 4:04:54 PM By: Derek Garza Entered By: Derek Garza on 02/13/2018 09:27:57 Derek Garza ([id number] Garza ([id number]) -------------------------------------------------------------------------------- Multi Wound Chart Details Patient Name: Derek Garza Date of Service: 02/13/2018 8:45 AM Medical Record Number: [id number] Patient Account Number: [id number] Date of Birth/Sex: [birth year] (57 y.o. M) Treating RN: Derek Garza Primary Care Kemar Pandit: Derek Garza Other Clinician: Referring Hector Taft: Derek Garza Treating Elya Diloreto/Extender: Derek [place] in Treatment: 0 Vital Signs Height(in): Pulse(bpm): 85 Weight(lbs): Blood Pressure(mmHg): 115/69 Body Mass Index(BMI): Temperature(F): 98.0 Respiratory Rate 16 (breaths/min): Photos: [N/A:N/A] Wound Location: Right Toe Great N/A N/A Wounding Event: Not Known N/A N/A Primary Etiology: Diabetic Wound/Ulcer of the N/A N/A Lower Extremity Comorbid History: Hypertension, Type II Diabetes N/A N/A Date Acquired: 08/06/2017 N/A N/A Weeks of Treatment: 0 N/A N/A Wound Status: Open N/A N/A Pending Amputation on Yes N/A N/A Presentation: Measurements L x W x D 0.3x0.1x0.1 N/A N/A (cm) Area (cm) : 0.024 N/A N/A Volume (cm) : 0.002 N/A N/A Classification: Grade 1 N/A N/A Exudate Amount: None Present N/A N/A Wound Margin: Flat and Intact N/A N/A Granulation Amount: None Present (0%) N/A N/A Necrotic Amount: Large (67-100%) N/A N/A Necrotic Tissue: Eschar N/A N/A Exposed Structures: Fascia: No N/A N/A Fat Layer (Subcutaneous Tissue) Exposed: No Tendon: No Muscle: No Joint: No Bone: No Epithelialization: None N/A N/A Periwound Skin Texture: N/A N/A Pounds, Viviann Spare ([id number]) Callus: Yes Excoriation: No Induration: No Crepitus: No Rash: No Scarring: No Periwound Skin Moisture: Maceration: No N/A  N/A Dry/Scaly: No Periwound Skin Color: Atrophie Blanche: No N/A N/A Cyanosis: No Ecchymosis: No Erythema: No Hemosiderin Staining: No Mottled: No Pallor: No Rubor: No Temperature: No Abnormality N/A N/A Tenderness on Palpation: No N/A N/A Wound Preparation: Ulcer Cleansing: N/A N/A Rinsed/Irrigated with Saline Topical Anesthetic Applied: Other: lidocaine 4% Treatment Notes Electronic Signature(s) Signed: 02/14/2018 5:07:26 PM By: Derek Garza, BSN, RN, CWS, Kim RN, BSN Entered By: Derek Garza, BSN, RN, CWS, Derek Garza on 02/13/2018 09:43:59 Derek Garza ([id number]) -------------------------------------------------------------------------------- Multi-Disciplinary Care Plan Details Patient Name: Derek Garza Date of Service: 02/13/2018 8:45 AM Medical Record Number: [id number] Patient Account Number: [id number] Date of Birth/Sex: Aug 25, 1960 (57 y.o. M) Treating RN: Derek Garza Primary Care Elton Catalano: Derek Garza Other Clinician: Referring Charrie Mcconnon: Derek Garza Treating Cornelius Marullo/Extender: Derek Garza  Weeks in Treatment: 0 Active Inactive Psychologist, prison and probation serviceslectronic Signature(s) Signed: 02/18/2018 2:05:04 PM By: Derek GurneyWoody, BSN, RN, CWS, Kim RN, BSN Previous Signature: 02/14/2018 5:07:26 PM Version By: Derek GurneyWoody, BSN, RN, CWS, Kim RN, BSN Entered By: Derek GurneyWoody, BSN, RN, CWS, Derek Garza on 02/18/2018 14:05:04 Derek ChuteOWDERY, Marland ([id number]) -------------------------------------------------------------------------------- Non-Wound Condition Assessment Details Patient Name: Derek ChuteOWDERY, Wah Date of Service: 02/13/2018 8:45 AM Medical Record Number: [id number] Patient Account Number: [id number] Date of Birth/Sex: 20-Dec-1960 (57 y.o. M) Treating RN: Derek CoventryWoody, Derek Garza Primary Care Jakai Risse: Derek MossRISSMAN, MARK Other Clinician: Referring Brittnee Gaetano: Derek PerchesJOHNSON, MEGAN Treating Tanea Moga/Extender: Derek CarolinaOBSON, MICHAEL G Weeks in Treatment: 0 Non-Wound Condition: Condition: Cellulitis Location: Foot Side: Right Periwound Skin Texture Texture  Color No Abnormalities Noted: No No Abnormalities Noted: No Erythema: Yes Moisture Erythema Location: Circumferential No Abnormalities Noted: No Temperature / Pain Temperature: Hot Tenderness on Palpation: Yes Electronic Signature(s) Signed: 02/14/2018 5:07:26 PM By: Derek GurneyWoody, BSN, RN, CWS, Kim RN, BSN Entered By: Derek GurneyWoody, BSN, RN, CWS, Derek Garza on 02/13/2018 09:54:59 Derek ChuteOWDERY, Matthan ([id number]) -------------------------------------------------------------------------------- Pain Assessment Details Patient Name: Derek ChuteOWDERY, Jabin Date of Service: 02/13/2018 8:45 AM Medical Record Number: [id number] Patient Account Number: [id number] Date of Birth/Sex: 20-Dec-1960 (57 y.o. M) Treating RN: Derek Sitesorthy, Joanna Primary Care Ceira Hoeschen: Derek MossRISSMAN, MARK Other Clinician: Referring Latanya Hemmer: Derek PerchesJOHNSON, MEGAN Treating Jimeka Balan/Extender: Derek CarolinaOBSON, MICHAEL G Weeks in Treatment: 0 Active Problems Location of Pain Severity and Description of Pain Patient Has Paino Yes Site Locations Pain Location: Pain in Ulcers With Dressing Change: Yes Duration of the Pain. Constant / Intermittento Constant Pain Management and Medication Current Pain Management: Electronic Signature(s) Signed: 02/13/2018 4:04:54 PM By: Derek Sitesorthy, Joanna Entered By: Derek Sitesorthy, Joanna on 02/13/2018 09:03:02 Derek ChuteOWDERY, Maleak ([id number]) -------------------------------------------------------------------------------- Vitals Details Patient Name: Derek ChuteOWDERY, Woodroe Date of Service: 02/13/2018 8:45 AM Medical Record Number: [id number] Patient Account Number: [id number] Date of Birth/Sex: 20-Dec-1960 (57 y.o. M) Treating RN: Derek Sitesorthy, Joanna Primary Care Jayshaun Phillips: Derek MossRISSMAN, MARK Other Clinician: Referring Krrish Freund: Derek PerchesJOHNSON, MEGAN Treating Shealeigh Dunstan/Extender: Derek CarolinaOBSON, MICHAEL G Weeks in Treatment: 0 Vital Signs Time Taken: 09:03 Temperature (F): 98.0 Pulse (bpm): 85 Respiratory Rate (breaths/min): 16 Blood Pressure (mmHg): 115/69 Reference Range: 80 - 120  mg / dl Electronic Signature(s) Signed: 02/13/2018 4:04:54 PM By: Derek Sitesorthy, Joanna Entered By: Derek Sitesorthy, Joanna on 02/13/2018 09:04:44
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
## 2018-03-27 ENCOUNTER — Other Ambulatory Visit: Payer: Self-pay | Admitting: Family Medicine

## 2018-03-27 DIAGNOSIS — E119 Type 2 diabetes mellitus without complications: Secondary | ICD-10-CM

## 2018-04-22 ENCOUNTER — Other Ambulatory Visit: Payer: Self-pay | Admitting: Family Medicine

## 2018-04-22 DIAGNOSIS — R609 Edema, unspecified: Secondary | ICD-10-CM

## 2018-04-22 DIAGNOSIS — I10 Essential (primary) hypertension: Secondary | ICD-10-CM

## 2018-05-14 ENCOUNTER — Ambulatory Visit (INDEPENDENT_AMBULATORY_CARE_PROVIDER_SITE_OTHER): Payer: Managed Care, Other (non HMO) | Admitting: Family Medicine

## 2018-05-14 ENCOUNTER — Encounter: Payer: Self-pay | Admitting: Family Medicine

## 2018-05-14 VITALS — BP 133/77 | HR 94 | Temp 98.3°F | Wt 230.4 lb

## 2018-05-14 DIAGNOSIS — E7800 Pure hypercholesterolemia, unspecified: Secondary | ICD-10-CM

## 2018-05-14 DIAGNOSIS — I10 Essential (primary) hypertension: Secondary | ICD-10-CM | POA: Diagnosis not present

## 2018-05-14 DIAGNOSIS — E1142 Type 2 diabetes mellitus with diabetic polyneuropathy: Secondary | ICD-10-CM | POA: Diagnosis not present

## 2018-05-14 DIAGNOSIS — F3181 Bipolar II disorder: Secondary | ICD-10-CM

## 2018-05-14 DIAGNOSIS — D692 Other nonthrombocytopenic purpura: Secondary | ICD-10-CM | POA: Insufficient documentation

## 2018-05-14 LAB — BAYER DCA HB A1C WAIVED: HB A1C (BAYER DCA - WAIVED): 6.1 % (ref <7.0)

## 2018-05-14 LAB — LP+ALT+AST PICCOLO, WAIVED
ALT (SGPT) Piccolo, Waived: 22 U/L (ref 10–47)
AST (SGOT) Piccolo, Waived: 19 U/L (ref 11–38)
CHOLESTEROL PICCOLO, WAIVED: 160 mg/dL (ref <200)
Chol/HDL Ratio Piccolo,Waive: 3.1 mg/dL
HDL Chol Piccolo, Waived: 51 mg/dL — ABNORMAL LOW (ref >59)
LDL CHOL CALC PICCOLO WAIVED: 73 mg/dL (ref <100)
Triglycerides Piccolo,Waived: 182 mg/dL — ABNORMAL HIGH (ref <150)
VLDL CHOL CALC PICCOLO,WAIVE: 36 mg/dL — AB (ref <30)

## 2018-05-14 MED ORDER — ATORVASTATIN CALCIUM 10 MG PO TABS: 10.0000 mg | ORAL_TABLET | Freq: Every day | ORAL | 3 refills | Status: DC

## 2018-05-14 NOTE — Assessment & Plan Note (Signed)
stable °

## 2018-05-14 NOTE — Assessment & Plan Note (Signed)
Discussed and gave rx

## 2018-05-14 NOTE — Assessment & Plan Note (Signed)
The current medical regimen is effective;  continue present plan and medications.  

## 2018-05-14 NOTE — Progress Notes (Signed)
BP 133/77   Pulse 94   Temp 98.3 F (36.8 C) (Oral)   Wt 230 lb 6.4 oz (104.5 kg)   SpO2 93%   BMI 31.79 kg/m    Subjective:    Patient ID: Derek Garza, male    DOB: 03-29-1961, 57 y.o.   MRN: [id number]  HPI: Derek ChuteSteven Naim is a 57 y.o. male  Chief Complaint  Patient presents with  . Diabetes  . Hyperlipidemia  . Hypertension  Patient follow-up all in all doing well with no complaints diabetes apparent good control. Cholesterol doing well by report not taking cholesterol medicine concerned about taking statins. Blood pressure good control with no issues from medications.  Relevant past medical, surgical, family and social history reviewed and updated as indicated. Interim medical history since our last visit reviewed. Allergies and medications reviewed and updated.  Review of Systems  Constitutional: Negative.   Respiratory: Negative.   Cardiovascular: Negative.     Per HPI unless specifically indicated above     Objective:    BP 133/77   Pulse 94   Temp 98.3 F (36.8 C) (Oral)   Wt 230 lb 6.4 oz (104.5 kg)   SpO2 93%   BMI 31.79 kg/m   Wt Readings from Last 3 Encounters:  05/14/18 230 lb 6.4 oz (104.5 kg)  01/31/18 238 lb (108 kg)  08/09/17 233 lb 6.4 oz (105.9 kg)    Physical Exam Constitutional:      Appearance: He is well-developed.  HENT:     Head: Normocephalic and atraumatic.  Eyes:     Conjunctiva/sclera: Conjunctivae normal.  Neck:     Musculoskeletal: Normal range of motion.  Cardiovascular:     Rate and Rhythm: Normal rate and regular rhythm.     Heart sounds: Normal heart sounds.  Pulmonary:     Effort: Pulmonary effort is normal.     Breath sounds: Normal breath sounds.  Musculoskeletal: Normal range of motion.  Skin:    Findings: No erythema.  Neurological:     Mental Status: He is alert and oriented to person, place, and time.  Psychiatric:        Behavior: Behavior normal.        Thought Content: Thought content normal.         Judgment: Judgment normal.     Results for orders placed or performed in visit on 01/31/18  HIV antibody  Result Value Ref Range   HIV Screen 4th Generation wRfx Non Reactive Non Reactive  Comprehensive metabolic panel  Result Value Ref Range   Glucose 136 (H) 65 - 99 mg/dL   BUN 16 6 - 24 mg/dL   Creatinine, Ser 4.090.99 0.76 - 1.27 mg/dL   GFR calc non Af Amer 84 >59 mL/min/1.73   GFR calc Af Amer 97 >59 mL/min/1.73   BUN/Creatinine Ratio 16 9 - 20   Sodium 138 134 - 144 mmol/L   Potassium 4.5 3.5 - 5.2 mmol/L   Chloride 99 96 - 106 mmol/L   CO2 22 20 - 29 mmol/L   Calcium 9.5 8.7 - 10.2 mg/dL   Total Protein 7.2 6.0 - 8.5 g/dL   Albumin 4.5 3.5 - 5.5 g/dL   Globulin, Total 2.7 1.5 - 4.5 g/dL   Albumin/Globulin Ratio 1.7 1.2 - 2.2   Bilirubin Total 0.4 0.0 - 1.2 mg/dL   Alkaline Phosphatase 97 39 - 117 IU/L   AST 14 0 - 40 IU/L   ALT 20 0 - 44 IU/L  Bayer DCA Hb A1c Waived  Result Value Ref Range   HB A1C (BAYER DCA - WAIVED) 6.6 <7.0 %  Lipid Panel w/o Chol/HDL Ratio  Result Value Ref Range   Cholesterol, Total 207 (H) 100 - 199 mg/dL   Triglycerides 161 (H) 0 - 149 mg/dL   HDL 46 >09 mg/dL   VLDL Cholesterol Cal 33 5 - 40 mg/dL   LDL Calculated 604 (H) 0 - 99 mg/dL      Assessment & Plan:   Problem List Items Addressed This Visit      Cardiovascular and Mediastinum   Hypertension    The current medical regimen is effective;  continue present plan and medications.       Relevant Medications   atorvastatin (LIPITOR) 10 MG tablet   Other Relevant Orders   Basic metabolic panel   Purpura senilis (HCC)    stable      Relevant Medications   atorvastatin (LIPITOR) 10 MG tablet     Endocrine   DM type 2 with diabetic peripheral neuropathy (HCC)    The current medical regimen is effective;  continue present plan and medications.       Relevant Medications   atorvastatin (LIPITOR) 10 MG tablet     Other   Bipolar 2 disorder (HCC)    The current  medical regimen is effective;  continue present plan and medications.       Hypercholesterolemia    Discussed and gave rx      Relevant Medications   atorvastatin (LIPITOR) 10 MG tablet    Other Visit Diagnoses    Type 2 diabetes mellitus with peripheral neuropathy (HCC)    -  Primary   Relevant Medications   atorvastatin (LIPITOR) 10 MG tablet   Other Relevant Orders   Bayer DCA Hb A1c Waived   Hypercholesteremia       Relevant Medications   atorvastatin (LIPITOR) 10 MG tablet   Other Relevant Orders   LP+ALT+AST Piccolo, Waived       Follow up plan: Return for April , Hemoglobin A1c, Physical Exam.

## 2018-05-15 ENCOUNTER — Encounter: Payer: Self-pay | Admitting: Family Medicine

## 2018-05-15 LAB — BASIC METABOLIC PANEL
BUN / CREAT RATIO: 14 (ref 9–20)
BUN: 11 mg/dL (ref 6–24)
CO2: 25 mmol/L (ref 20–29)
Calcium: 9 mg/dL (ref 8.7–10.2)
Chloride: 99 mmol/L (ref 96–106)
Creatinine, Ser: 0.78 mg/dL (ref 0.76–1.27)
GFR calc non Af Amer: 99 mL/min/{1.73_m2} (ref >59)
GFR, EST AFRICAN AMERICAN: 115 mL/min/{1.73_m2} (ref >59)
Glucose: 196 mg/dL — ABNORMAL HIGH (ref 65–99)
Potassium: 4.6 mmol/L (ref 3.5–5.2)
SODIUM: 138 mmol/L (ref 134–144)

## 2018-06-16 ENCOUNTER — Other Ambulatory Visit: Payer: Self-pay | Admitting: Family Medicine

## 2018-06-16 DIAGNOSIS — E119 Type 2 diabetes mellitus without complications: Secondary | ICD-10-CM

## 2018-06-17 NOTE — Telephone Encounter (Signed)
Requested Prescriptions  Pending Prescriptions Disp Refills  . metFORMIN (GLUCOPHAGE) 500 MG tablet [Pharmacy Med Name: MetFORMIN 500MG TABLET] 180 tablet 0    Sig: TAKE 1 TABLET BY MOUTH TWO  TIMES DAILY WITH MEALS     Endocrinology:  Diabetes - Biguanides Passed - 06/16/2018  9:02 PM      Passed - Cr in normal range and within 360 days    Creatinine  Date Value Ref Range Status  06/09/2012 1.02 0.60 - 1.30 mg/dL Final   Creatinine, Ser  Date Value Ref Range Status  05/14/2018 0.78 0.76 - 1.27 mg/dL Final         Passed - HBA1C is between 0 and 7.9 and within 180 days    Hemoglobin A1C  Date Value Ref Range Status  01/10/2016 7.3  Final   HB A1C (BAYER DCA - WAIVED)  Date Value Ref Range Status  05/14/2018 6.1 <7.0 % Final    Comment:                                          Diabetic Adult            <7.0                                       Healthy Adult        4.3 - 5.7                                                           (DCCT/NGSP) American Diabetes Association's Summary of Glycemic Recommendations for Adults with Diabetes: Hemoglobin A1c <7.0%. More stringent glycemic goals (A1c <6.0%) may further reduce complications at the cost of increased risk of hypoglycemia.          Passed - eGFR in normal range and within 360 days    EGFR (African American)  Date Value Ref Range Status  06/09/2012 >60  Final   GFR calc Af Amer  Date Value Ref Range Status  05/14/2018 115 >59 mL/min/1.73 Final   EGFR (Non-African Amer.)  Date Value Ref Range Status  06/09/2012 >60  Final    Comment:    eGFR values <74m/min/1.73 m2 may be an indication of chronic kidney disease (CKD). Calculated eGFR is useful in patients with stable renal function. The eGFR calculation will not be reliable in acutely ill patients when serum creatinine is changing rapidly. It is not useful in  patients on dialysis. The eGFR calculation may not be applicable to patients at the low and high  extremes of body sizes, pregnant women, and vegetarians.    GFR calc non Af Amer  Date Value Ref Range Status  05/14/2018 99 >59 mL/min/1.73 Final         Passed - Valid encounter within last 6 months    Recent Outpatient Visits          1 month ago Type 2 diabetes mellitus with peripheral neuropathy ([place]   4 months ago Diabetic ulcer of right great toe ([facility]   [place]   10 months ago Skin ulcer of right foot, limited to breakdown of skin [facility])   [facility] [place], [place]   1 year ago Essential hypertension   [facility] [place], Jeannette How, MD   1 year ago Essential hypertension   [place], Jeannette How, MD      Future Appointments            In 3 months Criss[place], Jeannette How, MD [facility], [place]

## 2018-06-23 ENCOUNTER — Other Ambulatory Visit: Payer: Self-pay | Admitting: Family Medicine

## 2018-06-23 DIAGNOSIS — E1142 Type 2 diabetes mellitus with diabetic polyneuropathy: Secondary | ICD-10-CM

## 2018-08-27 ENCOUNTER — Other Ambulatory Visit: Payer: Self-pay | Admitting: Family Medicine

## 2018-08-27 DIAGNOSIS — I10 Essential (primary) hypertension: Secondary | ICD-10-CM

## 2018-09-10 ENCOUNTER — Other Ambulatory Visit: Payer: Self-pay | Admitting: Family Medicine

## 2018-09-10 DIAGNOSIS — E119 Type 2 diabetes mellitus without complications: Secondary | ICD-10-CM

## 2018-09-18 ENCOUNTER — Ambulatory Visit (INDEPENDENT_AMBULATORY_CARE_PROVIDER_SITE_OTHER): Payer: Managed Care, Other (non HMO) | Admitting: Family Medicine

## 2018-09-18 ENCOUNTER — Encounter: Payer: Self-pay | Admitting: Family Medicine

## 2018-09-18 DIAGNOSIS — E119 Type 2 diabetes mellitus without complications: Secondary | ICD-10-CM

## 2018-09-18 DIAGNOSIS — R609 Edema, unspecified: Secondary | ICD-10-CM | POA: Diagnosis not present

## 2018-09-18 DIAGNOSIS — F3181 Bipolar II disorder: Secondary | ICD-10-CM

## 2018-09-18 DIAGNOSIS — I10 Essential (primary) hypertension: Secondary | ICD-10-CM | POA: Diagnosis not present

## 2018-09-18 DIAGNOSIS — E1142 Type 2 diabetes mellitus with diabetic polyneuropathy: Secondary | ICD-10-CM

## 2018-09-18 DIAGNOSIS — E291 Testicular hypofunction: Secondary | ICD-10-CM | POA: Diagnosis not present

## 2018-09-18 DIAGNOSIS — E7800 Pure hypercholesterolemia, unspecified: Secondary | ICD-10-CM

## 2018-09-18 MED ORDER — ATORVASTATIN CALCIUM 10 MG PO TABS: 10.0000 mg | ORAL_TABLET | Freq: Every day | ORAL | 3 refills | Status: DC

## 2018-09-18 MED ORDER — EMPAGLIFLOZIN 25 MG PO TABS: 25.0000 mg | ORAL_TABLET | Freq: Every day | ORAL | 3 refills | Status: DC

## 2018-09-18 MED ORDER — GABAPENTIN 600 MG PO TABS: 600.0000 mg | ORAL_TABLET | Freq: Two times a day (BID) | ORAL | 3 refills | Status: DC

## 2018-09-18 MED ORDER — METFORMIN HCL 500 MG PO TABS: 500.0000 mg | ORAL_TABLET | Freq: Two times a day (BID) | ORAL | 3 refills | Status: DC

## 2018-09-18 MED ORDER — TADALAFIL 20 MG PO TABS: 20.0000 mg | ORAL_TABLET | Freq: Every day | ORAL | 4 refills | Status: DC | PRN

## 2018-09-18 MED ORDER — BENAZEPRIL HCL 40 MG PO TABS: 40.0000 mg | ORAL_TABLET | Freq: Every day | ORAL | 3 refills | Status: DC

## 2018-09-18 MED ORDER — FUROSEMIDE 80 MG PO TABS: 80.0000 mg | ORAL_TABLET | Freq: Every day | ORAL | 3 refills | Status: DC

## 2018-09-18 MED ORDER — POTASSIUM CHLORIDE ER 10 MEQ PO TBCR: 10.0000 meq | EXTENDED_RELEASE_TABLET | Freq: Every day | ORAL | 3 refills | Status: DC

## 2018-09-18 NOTE — Assessment & Plan Note (Signed)
The current medical regimen is effective;  continue present plan and medications.  

## 2018-09-18 NOTE — Progress Notes (Signed)
There were no vitals taken for this visit.   Subjective:    Patient ID: Derek Garza, male    DOB: January 10, 1961, 58 y.o.   MRN: [id number]  HPI: Derek Garza is a 58 y.o. male  Med check Telemedicine using audio/video telecommunications for a synchronous communication visit. Today's visit due to COVID-19 isolation precautions I connected with and verified that I am speaking with the correct person using two identifiers.   I discussed the limitations, risks, security and privacy concerns of performing an evaluation and management service by telecommunication and the availability of in person appointments. I also discussed with the patient that there may be a patient responsible charge related to this service. The patient expressed understanding and agreed to proceed. The patient's location is home. I am at home.  Reviewed with patient's his medication.  Is doing well with no complaints nerve medicine prescribed by psychiatry is doing well with good control. Songs he takes his Lasix no problems with fluid retention. Diabetes doing well with good control no low blood sugar spells or issues with medications. Gabapentin doing well for neuropathy with good control. Cholesterol also doing well with atorvastatin. ED doing well with Cialis.  Relevant past medical, surgical, family and social history reviewed and updated as indicated. Interim medical history since our last visit reviewed. Allergies and medications reviewed and updated.  Review of Systems  Constitutional: Negative.   Respiratory: Negative.   Cardiovascular: Negative.     Per HPI unless specifically indicated above     Objective:    There were no vitals taken for this visit.  Wt Readings from Last 3 Encounters:  05/14/18 230 lb 6.4 oz (104.5 kg)  01/31/18 238 lb (108 kg)  08/09/17 233 lb 6.4 oz (105.9 kg)    Physical Exam  Results for orders placed or performed in visit on 05/14/18  Bayer DCA Hb A1c Waived   Result Value Ref Range   HB A1C (BAYER DCA - WAIVED) 6.1 <7.0 %  Basic metabolic panel  Result Value Ref Range   Glucose 196 (H) 65 - 99 mg/dL   BUN 11 6 - 24 mg/dL   Creatinine, Ser 4.090.78 0.76 - 1.27 mg/dL   GFR calc non Af Amer 99 >59 mL/min/1.73   GFR calc Af Amer 115 >59 mL/min/1.73   BUN/Creatinine Ratio 14 9 - 20   Sodium 138 134 - 144 mmol/L   Potassium 4.6 3.5 - 5.2 mmol/L   Chloride 99 96 - 106 mmol/L   CO2 25 20 - 29 mmol/L   Calcium 9.0 8.7 - 10.2 mg/dL  LP+ALT+AST Piccolo, Waived  Result Value Ref Range   ALT (SGPT) Piccolo, Waived 22 10 - 47 U/L   AST (SGOT) Piccolo, Waived 19 11 - 38 U/L   Cholesterol Piccolo, Waived 160 <200 mg/dL   HDL Chol Piccolo, Waived 51 (L) >59 mg/dL   Triglycerides Piccolo,Waived 182 (H) <150 mg/dL   Chol/HDL Ratio Piccolo,Waive 3.1 mg/dL   LDL Chol Calc Piccolo Waived 73 <100 mg/dL   VLDL Chol Calc Piccolo,Waive 36 (H) <30 mg/dL      Assessment & Plan:   Problem List Items Addressed This Visit      Cardiovascular and Mediastinum   Hypertension    The current medical regimen is effective;  continue present plan and medications.       Relevant Medications   tadalafil (CIALIS) 20 MG tablet   furosemide (LASIX) 80 MG tablet   benazepril (LOTENSIN) 40 MG tablet  atorvastatin (LIPITOR) 10 MG tablet   potassium chloride (K-DUR) 10 MEQ tablet     Endocrine   Hypogonadism in male   Relevant Medications   tadalafil (CIALIS) 20 MG tablet     Nervous and Auditory   DM type 2 with diabetic peripheral neuropathy (HCC)    The current medical regimen is effective;  continue present plan and medications.       Relevant Medications   benazepril (LOTENSIN) 40 MG tablet   empagliflozin (JARDIANCE) 25 MG TABS tablet   atorvastatin (LIPITOR) 10 MG tablet   gabapentin (NEURONTIN) 600 MG tablet   metFORMIN (GLUCOPHAGE) 500 MG tablet     Other   Bipolar 2 disorder (HCC)    The current medical regimen is effective;  continue present plan  and medications.       Peripheral edema    The current medical regimen is effective;  continue present plan and medications.       Relevant Medications   furosemide (LASIX) 80 MG tablet   Hypercholesterolemia    The current medical regimen is effective;  continue present plan and medications.       Relevant Medications   tadalafil (CIALIS) 20 MG tablet   furosemide (LASIX) 80 MG tablet   benazepril (LOTENSIN) 40 MG tablet   atorvastatin (LIPITOR) 10 MG tablet    Other Visit Diagnoses    Diabetes mellitus without complication (HCC)       Relevant Medications   benazepril (LOTENSIN) 40 MG tablet   empagliflozin (JARDIANCE) 25 MG TABS tablet   atorvastatin (LIPITOR) 10 MG tablet   metFORMIN (GLUCOPHAGE) 500 MG tablet      I discussed the assessment and treatment plan with the patient. The patient was provided an opportunity to ask questions and all were answered. The patient agreed with the plan and demonstrated an understanding of the instructions.   The patient was advised to call back or seek an in-person evaluation if the symptoms worsen or if the condition fails to improve as anticipated.   I provided 21+ minutes of time during this encounter. Follow up plan: Return in about 3 months (around 12/18/2018) for Physical Exam, Hemoglobin A1c.

## 2018-10-23 ENCOUNTER — Other Ambulatory Visit: Payer: Self-pay

## 2018-10-23 ENCOUNTER — Ambulatory Visit: Payer: Managed Care, Other (non HMO) | Admitting: Family Medicine

## 2018-10-24 ENCOUNTER — Ambulatory Visit: Payer: Managed Care, Other (non HMO) | Admitting: Family Medicine

## 2018-10-24 ENCOUNTER — Other Ambulatory Visit: Payer: Self-pay

## 2018-10-24 ENCOUNTER — Encounter: Payer: Self-pay | Admitting: Family Medicine

## 2018-10-24 VITALS — BP 168/84 | HR 111 | Temp 98.1°F | Ht 72.0 in | Wt 226.0 lb

## 2018-10-24 DIAGNOSIS — R300 Dysuria: Secondary | ICD-10-CM | POA: Diagnosis not present

## 2018-10-24 LAB — UA/M W/RFLX CULTURE, ROUTINE
Bilirubin, UA: NEGATIVE
Leukocytes,UA: NEGATIVE
Nitrite, UA: NEGATIVE
Specific Gravity, UA: 1.015 (ref 1.005–1.030)
Urobilinogen, Ur: 0.2 mg/dL (ref 0.2–1.0)
pH, UA: 6 (ref 5.0–7.5)

## 2018-10-24 LAB — MICROSCOPIC EXAMINATION: Bacteria, UA: NONE SEEN

## 2018-10-24 MED ORDER — CIPROFLOXACIN HCL 250 MG PO TABS: 250.0000 mg | ORAL_TABLET | Freq: Two times a day (BID) | ORAL | 0 refills | Status: DC

## 2018-10-24 NOTE — Progress Notes (Signed)
BP (!) 168/84   Pulse (!) 111   Temp 98.1 F (36.7 C) (Oral)   Ht 6' (1.829 m)   Wt 226 lb (102.5 kg)   SpO2 95%   BMI 30.65 kg/m    Subjective:    Patient ID: Derek Garza, male    DOB: 18-Nov-1960, 58 y.o.   MRN: [id number]  HPI: Derek ChuteSteven Proudfoot is a 58 y.o. male  Chief Complaint  Patient presents with  . Urinary Tract Infection    x a few days ago. burning urination  . Urinary Frequency   Several days of dysuria, now the past day or so having difficulty passing urine. Tried some AZO which did seem to help some. Wife gave him some leftover amoxicillin which he took a few doses of. Denies fevers, chills, nausea, vomiting, rectal pain, hematuria. No hx of UTIs in the past.   Relevant past medical, surgical, family and social history reviewed and updated as indicated. Interim medical history since our last visit reviewed. Allergies and medications reviewed and updated.  Review of Systems  Per HPI unless specifically indicated above     Objective:    BP (!) 168/84   Pulse (!) 111   Temp 98.1 F (36.7 C) (Oral)   Ht 6' (1.829 m)   Wt 226 lb (102.5 kg)   SpO2 95%   BMI 30.65 kg/m   Wt Readings from Last 3 Encounters:  10/24/18 226 lb (102.5 kg)  05/14/18 230 lb 6.4 oz (104.5 kg)  01/31/18 238 lb (108 kg)    Physical Exam Vitals signs and nursing note reviewed.  Constitutional:      Appearance: Normal appearance.  HENT:     Head: Atraumatic.  Eyes:     Extraocular Movements: Extraocular movements intact.     Conjunctiva/sclera: Conjunctivae normal.  Neck:     Musculoskeletal: Normal range of motion and neck supple.  Cardiovascular:     Rate and Rhythm: Normal rate and regular rhythm.  Pulmonary:     Effort: Pulmonary effort is normal.     Breath sounds: Normal breath sounds.  Abdominal:     General: Bowel sounds are normal.     Palpations: Abdomen is soft.     Tenderness: There is no abdominal tenderness. There is no right CVA tenderness, left CVA  tenderness or guarding.  Genitourinary:    Prostate: Normal.  Musculoskeletal: Normal range of motion.  Skin:    General: Skin is warm and dry.  Neurological:     General: No focal deficit present.     Mental Status: He is oriented to person, place, and time.  Psychiatric:        Mood and Affect: Mood normal.        Thought Content: Thought content normal.        Judgment: Judgment normal.     Results for orders placed or performed in visit on 10/24/18  Microscopic Examination  Result Value Ref Range   WBC, UA 0-5 0 - 5 /hpf   RBC 3-10 (A) 0 - 2 /hpf   Epithelial Cells (non renal) 0-10 0 - 10 /hpf   Bacteria, UA None seen None seen/Few  UA/M w/rflx Culture, Routine  Result Value Ref Range   Specific Gravity, UA 1.015 1.005 - 1.030   pH, UA 6.0 5.0 - 7.5   Color, UA Yellow Yellow   Appearance Ur Clear Clear   Leukocytes,UA Negative Negative   Protein,UA 1+ (A) Negative/Trace   Glucose, UA 3+ (A)  Negative   Ketones, UA 1+ (A) Negative   RBC, UA 1+ (A) Negative   Bilirubin, UA Negative Negative   Urobilinogen, Ur 0.2 0.2 - 1.0 mg/dL   Nitrite, UA Negative Negative   Microscopic Examination See below:       Assessment & Plan:   Problem List Items Addressed This Visit    None    Visit Diagnoses    Dysuria    -  Primary   Suspect UTI, though U/A without evidence of bacteria today (like due to several doses of home amoxil). Tx with cipro and monitor closely for improvement   Relevant Orders   UA/M w/rflx Culture, Routine (Completed)       Follow up plan: Return if symptoms worsen or fail to improve.

## 2018-10-25 ENCOUNTER — Other Ambulatory Visit: Payer: Self-pay | Admitting: Family Medicine

## 2018-10-25 DIAGNOSIS — R3129 Other microscopic hematuria: Secondary | ICD-10-CM

## 2018-10-28 HISTORY — PX: SPINE SURGERY: SHX786

## 2018-11-12 ENCOUNTER — Other Ambulatory Visit: Payer: Self-pay | Admitting: Family Medicine

## 2018-11-12 ENCOUNTER — Other Ambulatory Visit: Payer: Self-pay

## 2018-11-12 ENCOUNTER — Other Ambulatory Visit: Payer: Managed Care, Other (non HMO)

## 2018-11-12 DIAGNOSIS — Z0000 Encounter for general adult medical examination without abnormal findings: Secondary | ICD-10-CM

## 2018-11-12 DIAGNOSIS — E1142 Type 2 diabetes mellitus with diabetic polyneuropathy: Secondary | ICD-10-CM

## 2018-11-12 DIAGNOSIS — R3129 Other microscopic hematuria: Secondary | ICD-10-CM

## 2018-11-12 LAB — URINALYSIS, ROUTINE W REFLEX MICROSCOPIC
Bilirubin, UA: NEGATIVE
Ketones, UA: NEGATIVE
Leukocytes,UA: NEGATIVE
Nitrite, UA: NEGATIVE
Protein,UA: NEGATIVE
RBC, UA: NEGATIVE
Specific Gravity, UA: 1.01 (ref 1.005–1.030)
Urobilinogen, Ur: 0.2 mg/dL (ref 0.2–1.0)
pH, UA: 6 (ref 5.0–7.5)

## 2018-11-12 LAB — BAYER DCA HB A1C WAIVED: HB A1C (BAYER DCA - WAIVED): 6.3 % (ref <7.0)

## 2018-11-13 LAB — COMPREHENSIVE METABOLIC PANEL
ALT: 29 IU/L (ref 0–44)
AST: 27 IU/L (ref 0–40)
Albumin/Globulin Ratio: 1.7 (ref 1.2–2.2)
Albumin: 4.4 g/dL (ref 3.8–4.9)
Alkaline Phosphatase: 83 IU/L (ref 39–117)
BUN/Creatinine Ratio: 13 (ref 9–20)
BUN: 10 mg/dL (ref 6–24)
Bilirubin Total: 0.3 mg/dL (ref 0.0–1.2)
CO2: 26 mmol/L (ref 20–29)
Calcium: 9 mg/dL (ref 8.7–10.2)
Chloride: 98 mmol/L (ref 96–106)
Creatinine, Ser: 0.78 mg/dL (ref 0.76–1.27)
GFR calc Af Amer: 114 mL/min/{1.73_m2} (ref >59)
GFR calc non Af Amer: 99 mL/min/{1.73_m2} (ref >59)
Globulin, Total: 2.6 g/dL (ref 1.5–4.5)
Glucose: 145 mg/dL — ABNORMAL HIGH (ref 65–99)
Potassium: 4 mmol/L (ref 3.5–5.2)
Sodium: 138 mmol/L (ref 134–144)
Total Protein: 7 g/dL (ref 6.0–8.5)

## 2018-11-13 LAB — CBC WITH DIFFERENTIAL/PLATELET
Basophils Absolute: 0 10*3/uL (ref 0.0–0.2)
Basos: 0 %
EOS (ABSOLUTE): 0.1 10*3/uL (ref 0.0–0.4)
Eos: 1 %
Hematocrit: 41.9 % (ref 37.5–51.0)
Hemoglobin: 14.6 g/dL (ref 13.0–17.7)
Immature Grans (Abs): 0 10*3/uL (ref 0.0–0.1)
Immature Granulocytes: 0 %
Lymphocytes Absolute: 1.4 10*3/uL (ref 0.7–3.1)
Lymphs: 18 %
MCH: 32.3 pg (ref 26.6–33.0)
MCHC: 34.8 g/dL (ref 31.5–35.7)
MCV: 93 fL (ref 79–97)
Monocytes Absolute: 0.6 10*3/uL (ref 0.1–0.9)
Monocytes: 8 %
Neutrophils Absolute: 5.5 10*3/uL (ref 1.4–7.0)
Neutrophils: 73 %
Platelets: 444 10*3/uL (ref 150–450)
RBC: 4.52 x10E6/uL (ref 4.14–5.80)
RDW: 11.9 % (ref 11.6–15.4)
WBC: 7.6 10*3/uL (ref 3.4–10.8)

## 2018-11-13 LAB — LIPID PANEL
Chol/HDL Ratio: 3 ratio (ref 0.0–5.0)
Cholesterol, Total: 163 mg/dL (ref 100–199)
HDL: 55 mg/dL (ref >39)
LDL Calculated: 86 mg/dL (ref 0–99)
Triglycerides: 111 mg/dL (ref 0–149)
VLDL Cholesterol Cal: 22 mg/dL (ref 5–40)

## 2018-11-13 LAB — TSH: TSH: 1.44 u[IU]/mL (ref 0.450–4.500)

## 2018-11-13 LAB — PSA: Prostate Specific Ag, Serum: 4.5 ng/mL — ABNORMAL HIGH (ref 0.0–4.0)

## 2018-11-15 ENCOUNTER — Emergency Department: Payer: Managed Care, Other (non HMO)

## 2018-11-15 ENCOUNTER — Other Ambulatory Visit: Payer: Self-pay

## 2018-11-15 ENCOUNTER — Emergency Department
Admission: EM | Admit: 2018-11-15 | Discharge: 2018-11-16 | Disposition: A | Discharge status: Home / Self Care | Payer: Managed Care, Other (non HMO) | Attending: Emergency Medicine | Admitting: Emergency Medicine

## 2018-11-15 DIAGNOSIS — S0181XA Laceration without foreign body of other part of head, initial encounter: Secondary | ICD-10-CM

## 2018-11-15 DIAGNOSIS — I10 Essential (primary) hypertension: Secondary | ICD-10-CM | POA: Insufficient documentation

## 2018-11-15 DIAGNOSIS — W108XXA Fall (on) (from) other stairs and steps, initial encounter: Secondary | ICD-10-CM | POA: Insufficient documentation

## 2018-11-15 DIAGNOSIS — Y9389 Activity, other specified: Secondary | ICD-10-CM | POA: Diagnosis not present

## 2018-11-15 DIAGNOSIS — S0990XA Unspecified injury of head, initial encounter: Secondary | ICD-10-CM | POA: Diagnosis present

## 2018-11-15 DIAGNOSIS — Y9289 Other specified places as the place of occurrence of the external cause: Secondary | ICD-10-CM | POA: Diagnosis not present

## 2018-11-15 DIAGNOSIS — Y999 Unspecified external cause status: Secondary | ICD-10-CM | POA: Insufficient documentation

## 2018-11-15 DIAGNOSIS — Z79899 Other long term (current) drug therapy: Secondary | ICD-10-CM | POA: Diagnosis not present

## 2018-11-15 DIAGNOSIS — E1140 Type 2 diabetes mellitus with diabetic neuropathy, unspecified: Secondary | ICD-10-CM | POA: Insufficient documentation

## 2018-11-15 DIAGNOSIS — Z7984 Long term (current) use of oral hypoglycemic drugs: Secondary | ICD-10-CM | POA: Insufficient documentation

## 2018-11-15 MED ORDER — CEPHALEXIN 500 MG PO CAPS: 500.0000 mg | ORAL_CAPSULE | Freq: Three times a day (TID) | ORAL | 0 refills | Status: AC

## 2018-11-15 MED ORDER — LIDOCAINE-EPINEPHRINE 2 %-1:100000 IJ SOLN
20.0000 mL | Freq: Once | INTRAMUSCULAR | Status: DC
  Filled 2018-11-15: qty 1

## 2018-11-15 MED ORDER — LIDOCAINE HCL 1 % IJ SOLN
5.0000 mL | Freq: Once | INTRAMUSCULAR | Status: DC
  Filled 2018-11-15: qty 10

## 2018-11-15 NOTE — ED Triage Notes (Signed)
Patient reports slipped because of flip/flops and fell, hit head (denies loss of consciousness).  Patient with laceration at left eye brow and reports that left arm feels numb.  Patient denies taking any type of blood thinner.

## 2018-11-16 MED ORDER — DEXTROSE 50 % IV SOLN
INTRAVENOUS | Status: AC
  Filled 2018-11-16: qty 50

## 2018-11-16 NOTE — ED Provider Notes (Signed)
[facility] Emergency Department Provider Note  ____________________________________________  Time seen: Approximately 12:10 AM  I have reviewed the triage vital signs and the nursing notes.   HISTORY  Chief Complaint Fall and Laceration    HPI Derek Garza is a 58 y.o. male presents to the emergency department after he slipped while climbing stairs.  Patient states that he was carrying chicken in a cooler.  He was wearing flip-flops which contributed to fall.  He denies loss of consciousness.  No current blurry vision, vertigo, headache or nausea.  He has had some neck pain. He has been able to ambulate since injury occurred. No other alleviating measures have been attempted.         Past Medical History:  Diagnosis Date  . Diabetes mellitus without complication (HCC)   . Hypertension     Patient Active Problem List   Diagnosis Date Noted  . Purpura senilis (HCC) 05/14/2018  . Hypercholesterolemia 03/07/2017  . Weight gain following gastric bypass surgery 08/16/2015  . DM type 2 with diabetic peripheral neuropathy (HCC) 01/27/2015  . Hypertension 01/27/2015  . Hypogonadism in male 01/27/2015  . Bipolar 2 disorder (HCC) 01/27/2015  . Peripheral edema 01/27/2015    Past Surgical History:  Procedure Laterality Date  . ANKLE SURGERY  1979  . APPENDECTOMY  2011  . HERNIA REPAIR  [id number],2011  . VASECTOMY      Prior to Admission medications   Medication Sig Start Date End Date Taking? Authorizing Provider  atorvastatin (LIPITOR) 10 MG tablet Take 1 tablet (10 mg total) by mouth daily. 09/18/18   Steele Sizerrissman, Mark A, MD  benazepril (LOTENSIN) 40 MG tablet Take 1 tablet (40 mg total) by mouth daily. 09/18/18   Steele Sizerrissman, Mark A, MD  carbamazepine (TEGRETOL XR) 200 MG 12 hr tablet 200 mg 2 (two) times daily. 05/28/15   [provider]  cephALEXin (KEFLEX) 500 MG capsule Take 1 capsule (500 mg total) by mouth 3 (three) times daily for 7 days.  11/15/18 11/22/18  Orvil FeilWoods, Arney Mayabb M, PA-C  ciprofloxacin (CIPRO) 250 MG tablet Take 1 tablet (250 mg total) by mouth 2 (two) times daily. 10/24/18   Particia NearingLane, Rachel Elizabeth, PA-C  empagliflozin (JARDIANCE) 25 MG TABS tablet Take 25 mg by mouth daily. 09/18/18   Steele Sizerrissman, Mark A, MD  furosemide (LASIX) 80 MG tablet Take 1 tablet (80 mg total) by mouth daily. 09/18/18   Steele Sizerrissman, Mark A, MD  gabapentin (NEURONTIN) 600 MG tablet Take 1 tablet (600 mg total) by mouth 2 (two) times daily. 09/18/18   Steele Sizerrissman, Mark A, MD  metFORMIN (GLUCOPHAGE) 500 MG tablet Take 1 tablet (500 mg total) by mouth 2 (two) times daily with a meal. 09/18/18   Crissman, Redge GainerMark A, MD  potassium chloride (K-DUR) 10 MEQ tablet Take 1 tablet (10 mEq total) by mouth daily. 09/18/18   Steele Sizerrissman, Mark A, MD  QUEtiapine (SEROQUEL) 400 MG tablet Take 400 mg by mouth daily.  12/18/12   [provider]  tadalafil (CIALIS) 20 MG tablet Take 1 tablet (20 mg total) by mouth daily as needed for erectile dysfunction. 09/18/18   Steele Sizerrissman, Mark A, MD    Allergies Percocet [oxycodone-acetaminophen]  Family History  Problem Relation Age of Onset  . Breast cancer Sister     Social History Social History   Tobacco Use  . Smoking status: Never Smoker  . Smokeless tobacco: Never Used  Substance Use Topics  . Alcohol use: Yes    Comment: on occasion  .  Drug use: No     Review of Systems  Constitutional: No fever/chills Eyes: No visual changes. No discharge ENT: No upper respiratory complaints. Cardiovascular: no chest pain. Respiratory: no cough. No SOB. Gastrointestinal: No abdominal pain.  No nausea, no vomiting.  No diarrhea.  No constipation. Musculoskeletal: Negative for musculoskeletal pain. Skin: Patient has facial laceration.  Neurological: No headache, no focal weakness or numbness.   ____________________________________________   PHYSICAL EXAM:  VITAL SIGNS: ED Triage Vitals [11/15/18 2126]  Enc Vitals Group      BP      Pulse      Resp      Temp      Temp src      SpO2      Weight 228 lb (103.4 kg)     Height 5\' 10"  (1.778 m)     Head Circumference      Peak Flow      Pain Score      Pain Loc      Pain Edu?      Excl. in [place]?      Constitutional: Alert and oriented. Well appearing and in no acute distress. Eyes: Conjunctivae are normal. PERRL. EOMI. Head: Atraumatic.  Patient has Y-shaped laceration above left eyebrow. ENT:      Nose: No congestion/rhinnorhea.      Mouth/Throat: Mucous membranes are moist.  Neck: Full range of motion. No midline c spine tenderness.  Cardiovascular: Normal rate, regular rhythm. Normal S1 and S2.  Good peripheral circulation. Respiratory: Normal respiratory effort without tachypnea or retractions. Lungs CTAB. Good air entry to the bases with no decreased or absent breath sounds. Gastrointestinal: Bowel sounds 4 quadrants. Soft and nontender to palpation. No guarding or rigidity. No palpable masses. No distention. No CVA tenderness. Musculoskeletal: Full range of motion to all extremities. No gross deformities appreciated. Neurologic:  Normal speech and language. No gross focal neurologic deficits are appreciated.  Psychiatric: Mood and affect are normal. Speech and behavior are normal. Patient exhibits appropriate insight and judgement.   ____________________________________________   LABS (all labs ordered are listed, but only abnormal results are displayed)  Labs Reviewed - No data to display ____________________________________________  EKG   ____________________________________________  RADIOLOGY I personally viewed and evaluated these images as part of my medical decision making, as well as reviewing the written report by the radiologist.  Ct Head Wo Contrast  Result Date: 11/15/2018 CLINICAL DATA:  Slip and fall today with a blow to the head and laceration above the left eye. Initial encounter. EXAM: CT HEAD WITHOUT CONTRAST CT CERVICAL  SPINE WITHOUT CONTRAST TECHNIQUE: Multidetector CT imaging of the head and cervical spine was performed following the standard protocol without intravenous contrast. Multiplanar CT image reconstructions of the cervical spine were also generated. COMPARISON:  Head CT scan 06/10/2012. FINDINGS: CT HEAD FINDINGS Brain: No evidence of acute infarction, hemorrhage, hydrocephalus, extra-axial collection or mass lesion/mass effect. Vascular: Atherosclerosis noted. Skull: Intact.  No focal lesion. Sinuses/Orbits: Negative. Other: None. CT CERVICAL SPINE FINDINGS Alignment: Maintained with straightening of lordosis noted. Skull base and vertebrae: No acute fracture. No primary bone lesion or focal pathologic process. Soft tissues and spinal canal: No prevertebral fluid or swelling. No visible canal hematoma. Disc levels: Advanced for age appearing multilevel cervical spondylosis is worst from C4-C7. Upper chest: Lung apices clear. Other: None. IMPRESSION: No acute abnormality head or cervical spine. Atherosclerosis. Multilevel cervical degenerative change. Electronically Signed   By: Inge Rise M.D.   On: 11/15/2018  22:11   Ct Cervical Spine Wo Contrast  Result Date: 11/15/2018 CLINICAL DATA:  Slip and fall today with a blow to the head and laceration above the left eye. Initial encounter. EXAM: CT HEAD WITHOUT CONTRAST CT CERVICAL SPINE WITHOUT CONTRAST TECHNIQUE: Multidetector CT imaging of the head and cervical spine was performed following the standard protocol without intravenous contrast. Multiplanar CT image reconstructions of the cervical spine were also generated. COMPARISON:  Head CT scan 06/10/2012. FINDINGS: CT HEAD FINDINGS Brain: No evidence of acute infarction, hemorrhage, hydrocephalus, extra-axial collection or mass lesion/mass effect. Vascular: Atherosclerosis noted. Skull: Intact.  No focal lesion. Sinuses/Orbits: Negative. Other: None. CT CERVICAL SPINE FINDINGS Alignment: Maintained with  straightening of lordosis noted. Skull base and vertebrae: No acute fracture. No primary bone lesion or focal pathologic process. Soft tissues and spinal canal: No prevertebral fluid or swelling. No visible canal hematoma. Disc levels: Advanced for age appearing multilevel cervical spondylosis is worst from C4-C7. Upper chest: Lung apices clear. Other: None. IMPRESSION: No acute abnormality head or cervical spine. Atherosclerosis. Multilevel cervical degenerative change. Electronically Signed   By: Drusilla Kannerhomas  Dalessio M.D.   On: 11/15/2018 22:11    ____________________________________________    PROCEDURES  Procedure(s) performed:    Procedures  LACERATION REPAIR Performed by: Orvil FeilJaclyn M Rowin Bayron Authorized by: Orvil FeilJaclyn M Tyesha Joffe Consent: Verbal consent obtained. Risks and benefits: risks, benefits and alternatives were discussed Consent given by: patient Patient identity confirmed: provided demographic data Prepped and Draped in normal sterile fashion Wound explored  Laceration Location: Left eyebrow   Laceration Length: 2 cm  No Foreign Bodies seen or palpated  Anesthesia: local infiltration  Local anesthetic: lidocaine 1% with epinephrine  Anesthetic total: 3 ml  Irrigation method: syringe Amount of cleaning: standard  Skin closure: 6-0 Ethilon   Number of sutures: 6  Technique: Simple Interrupted  Patient tolerance: Patient tolerated the procedure well with no immediate complications.   Medications  lidocaine (XYLOCAINE) 1 % (with pres) injection 5 mL (has no administration in time range)  lidocaine-EPINEPHrine (XYLOCAINE W/EPI) 2 %-1:100000 (with pres) injection 20 mL (has no administration in time range)  dextrose 50 % solution (  Canceled Entry 11/16/18 0006)     ____________________________________________   INITIAL IMPRESSION / ASSESSMENT AND PLAN / ED COURSE  Pertinent labs & imaging results that were available during my care of the patient were reviewed by me  and considered in my medical decision making (see chart for details).  Review of the [place] CSRS was performed in accordance of the [facility] prior to dispensing any controlled drugs.       Assessment and plan Facial laceration Fall  Patient presents to the emergency department after a laceration above left eyebrow sustained after patient fell while climbing stairs.  On physical exam, patient had a reassuring neuro exam.  He had a 2 cm laceration above left eyebrow repaired in the emergency department without complication.  He was advised to have sutures removed by primary care in 5 days.  Patient was discharged with Keflex.  He reports that his tetanus status is up-to-date.  All patient questions were answered.         ____________________________________________  FINAL CLINICAL IMPRESSION(S) / ED DIAGNOSES  Final diagnoses:  Injury of head, initial encounter  Facial laceration, initial encounter      NEW MEDICATIONS STARTED DURING THIS VISIT:  ED Discharge Orders         Ordered    cephALEXin (KEFLEX) 500 MG capsule  3 times daily  11/15/18 2351              This chart was dictated using voice recognition software/Dragon. Despite best efforts to proofread, errors can occur which can change the meaning. Any change was purely unintentional.    Orvil FeilWoods, Hallee Mckenny M, PA-C 11/16/18 Bernita [id number]    Sharyn CreamerQuale, Mark, MD 11/17/18 [phone]

## 2018-12-30 ENCOUNTER — Other Ambulatory Visit: Payer: Self-pay | Admitting: Nurse Practitioner

## 2018-12-30 MED ORDER — POTASSIUM CHLORIDE ER 10 MEQ PO TBCR: 10.0000 meq | EXTENDED_RELEASE_TABLET | Freq: Every day | ORAL | 3 refills | Status: DC

## 2019-03-17 ENCOUNTER — Encounter: Payer: Self-pay | Admitting: Family Medicine

## 2019-03-17 ENCOUNTER — Other Ambulatory Visit: Payer: Self-pay

## 2019-03-17 ENCOUNTER — Ambulatory Visit (INDEPENDENT_AMBULATORY_CARE_PROVIDER_SITE_OTHER): Payer: Managed Care, Other (non HMO) | Admitting: Family Medicine

## 2019-03-17 DIAGNOSIS — R42 Dizziness and giddiness: Secondary | ICD-10-CM

## 2019-03-17 DIAGNOSIS — R110 Nausea: Secondary | ICD-10-CM

## 2019-03-17 MED ORDER — PROMETHAZINE HCL 25 MG PO TABS: 25.0000 mg | ORAL_TABLET | Freq: Three times a day (TID) | ORAL | 0 refills | Status: DC | PRN

## 2019-03-17 MED ORDER — MECLIZINE HCL 25 MG PO TABS: 25.0000 mg | ORAL_TABLET | Freq: Three times a day (TID) | ORAL | 0 refills | Status: DC | PRN

## 2019-03-17 NOTE — Progress Notes (Signed)
There were no vitals taken for this visit.   Subjective:    Patient ID: Derek ChuteSteven Forrester, male    DOB: 1960-11-09, 58 y.o.   MRN: [id number]  HPI: Derek Garza is a 58 y.o. male  Chief Complaint  Patient presents with  . Nausea    Patient has been dry heaving, no vomiting, started yesterday, feels dizzy when he bends down, unable to watch fast moving objects    . This visit was completed via WebEx due to the restrictions of the COVID-19 pandemic. All issues as above were discussed and addressed. Physical exam was done as above through visual confirmation on WebEx. If it was felt that the patient should be evaluated in the office, they were directed there. The patient verbally consented to this visit. . Location of the patient: home . Location of the provider: work . Those involved with this call:  . Provider: Roosvelt Maserachel Velma Hanna, PA-C . CMA: Tiffany Reel, CMA . Front Desk/Registration: Harriet PhoJoliza Johnson  . Time spent on call: 15 minutes with patient face to face via video conference. More than 50% of this time was spent in counseling and coordination of care. 5 minutes total spent in review of patient's record and preparation of their chart. I verified patient identity using two factors (patient name and date of birth). Patient consents verbally to being seen via telemedicine visit today.   Room spinning dizziness, dry heaving, nausea since last night. Worse with movement, especially laying down or bending or watching fast moving objects. Denies recent illness, fevers, CP, SOB, palpitations, hx of vertigo, ear pain, tinnitus. Not trying anything for relief other than resting and trying to stay still.   Relevant past medical, surgical, family and social history reviewed and updated as indicated. Interim medical history since our last visit reviewed. Allergies and medications reviewed and updated.  Review of Systems  Per HPI unless specifically indicated above     Objective:    There were  no vitals taken for this visit.  Wt Readings from Last 3 Encounters:  11/15/18 228 lb (103.4 kg)  10/24/18 226 lb (102.5 kg)  05/14/18 230 lb 6.4 oz (104.5 kg)    Physical Exam Vitals signs and nursing note reviewed.  Constitutional:      General: He is not in acute distress.    Appearance: Normal appearance.  HENT:     Head: Atraumatic.     Right Ear: External ear normal.     Left Ear: External ear normal.     Nose: Nose normal. No congestion.     Mouth/Throat:     Mouth: Mucous membranes are moist.     Pharynx: Oropharynx is clear.  Eyes:     Extraocular Movements: Extraocular movements intact.     Conjunctiva/sclera: Conjunctivae normal.  Neck:     Musculoskeletal: Normal range of motion.  Pulmonary:     Effort: Pulmonary effort is normal. No respiratory distress.  Musculoskeletal: Normal range of motion.  Skin:    General: Skin is dry.     Findings: No erythema or rash.  Neurological:     Mental Status: He is oriented to person, place, and time.  Psychiatric:        Mood and Affect: Mood normal.        Thought Content: Thought content normal.        Judgment: Judgment normal.     Results for orders placed or performed in visit on 11/12/18  Bayer DCA Hb A1c Waived  Result Value  Ref Range   HB A1C (BAYER DCA - WAIVED) 6.3 <7.0 %  PSA  Result Value Ref Range   Prostate Specific Ag, Serum 4.5 (H) 0.0 - 4.0 ng/mL  Urinalysis, Routine w reflex microscopic  Result Value Ref Range   Specific Gravity, UA 1.010 1.005 - 1.030   pH, UA 6.0 5.0 - 7.5   Color, UA Yellow Yellow   Appearance Ur Clear Clear   Leukocytes,UA Negative Negative   Protein,UA Negative Negative/Trace   Glucose, UA 3+ (A) Negative   Ketones, UA Negative Negative   RBC, UA Negative Negative   Bilirubin, UA Negative Negative   Urobilinogen, Ur 0.2 0.2 - 1.0 mg/dL   Nitrite, UA Negative Negative  TSH  Result Value Ref Range   TSH 1.440 0.450 - 4.500 uIU/mL  CBC with Differential/Platelet   Result Value Ref Range   WBC 7.6 3.4 - 10.8 x10E3/uL   RBC 4.52 4.14 - 5.80 x10E6/uL   Hemoglobin 14.6 13.0 - 17.7 g/dL   Hematocrit 72.5 36.6 - 51.0 %   MCV 93 79 - 97 fL   MCH 32.3 26.6 - 33.0 pg   MCHC 34.8 31.5 - 35.7 g/dL   RDW 44.0 34.7 - 42.5 %   Platelets 444 150 - 450 x10E3/uL   Neutrophils 73 Not Estab. %   Lymphs 18 Not Estab. %   Monocytes 8 Not Estab. %   Eos 1 Not Estab. %   Basos 0 Not Estab. %   Neutrophils Absolute 5.5 1.4 - 7.0 x10E3/uL   Lymphocytes Absolute 1.4 0.7 - 3.1 x10E3/uL   Monocytes Absolute 0.6 0.1 - 0.9 x10E3/uL   EOS (ABSOLUTE) 0.1 0.0 - 0.4 x10E3/uL   Basophils Absolute 0.0 0.0 - 0.2 x10E3/uL   Immature Granulocytes 0 Not Estab. %   Immature Grans (Abs) 0.0 0.0 - 0.1 x10E3/uL  Lipid panel  Result Value Ref Range   Cholesterol, Total 163 100 - 199 mg/dL   Triglycerides 956 0 - 149 mg/dL   HDL 55 >38 mg/dL   VLDL Cholesterol Cal 22 5 - 40 mg/dL   LDL Calculated 86 0 - 99 mg/dL   Chol/HDL Ratio 3.0 0.0 - 5.0 ratio  Comprehensive metabolic panel  Result Value Ref Range   Glucose 145 (H) 65 - 99 mg/dL   BUN 10 6 - 24 mg/dL   Creatinine, Ser 7.56 0.76 - 1.27 mg/dL   GFR calc non Af Amer 99 >59 mL/min/1.73   GFR calc Af Amer 114 >59 mL/min/1.73   BUN/Creatinine Ratio 13 9 - 20   Sodium 138 134 - 144 mmol/L   Potassium 4.0 3.5 - 5.2 mmol/L   Chloride 98 96 - 106 mmol/L   CO2 26 20 - 29 mmol/L   Calcium 9.0 8.7 - 10.2 mg/dL   Total Protein 7.0 6.0 - 8.5 g/dL   Albumin 4.4 3.8 - 4.9 g/dL   Globulin, Total 2.6 1.5 - 4.5 g/dL   Albumin/Globulin Ratio 1.7 1.2 - 2.2   Bilirubin Total 0.3 0.0 - 1.2 mg/dL   Alkaline Phosphatase 83 39 - 117 IU/L   AST 27 0 - 40 IU/L   ALT 29 0 - 44 IU/L      Assessment & Plan:   Problem List Items Addressed This Visit    None    Visit Diagnoses    Dizziness    -  Primary   Nausea        Suspect vertigo, will tx with phenergan, meclizine, and epley  maneuvers and monitor closely for improvement. Precautions  as well as supportive care reviewed. F/u if not improving over next few days   Follow up plan: Return if symptoms worsen or fail to improve.

## 2019-06-30 ENCOUNTER — Other Ambulatory Visit: Payer: Self-pay

## 2019-06-30 ENCOUNTER — Ambulatory Visit (INDEPENDENT_AMBULATORY_CARE_PROVIDER_SITE_OTHER): Payer: Managed Care, Other (non HMO) | Admitting: Nurse Practitioner

## 2019-06-30 ENCOUNTER — Encounter: Payer: Self-pay | Admitting: Nurse Practitioner

## 2019-06-30 VITALS — BP 128/78 | HR 80 | Temp 97.4°F | Wt 218.0 lb

## 2019-06-30 DIAGNOSIS — E1142 Type 2 diabetes mellitus with diabetic polyneuropathy: Secondary | ICD-10-CM

## 2019-06-30 LAB — BAYER DCA HB A1C WAIVED: HB A1C (BAYER DCA - WAIVED): 6.6 % (ref <7.0)

## 2019-06-30 NOTE — Progress Notes (Signed)
BP 128/78 (BP Location: Left Arm)   Pulse 80   Temp (!) 97.4 F (36.3 C) (Oral)   Wt 218 lb (98.9 kg)   SpO2 98%   BMI 31.28 kg/m    Subjective:    Patient ID: Derek Garza, male    DOB: 19-May-1961, 60 y.o.   MRN: [id number]  HPI: Derek Garza is a 59 y.o. male  Chief Complaint  Patient presents with  . Diabetes   DIABETES Hypoglycemic episodes:no Polydipsia/polyuria: no Visual disturbance: no Chest pain: no Paresthesias: no Glucose Monitoring: yes  Accucheck frequency: at least Daily  Fasting glucose: 100 or lower  Post prandial: can be as high as 200 Blood Pressure Monitoring: not checking Retinal Examination: Not up to Date - needs  Foot Exam: Not up to Date, patient declined, wants to get at physical appointment in April Diabetic Education: Not Completed patient declined Pneumovax: Up to Date  Influenza: Up to Date Aspirin: no   Relevant past medical, surgical, family and social history reviewed and updated as indicated. Interim medical history since our last visit reviewed. Allergies and medications reviewed and updated.  Allergies  Allergen Reactions  . Percocet [Oxycodone-Acetaminophen] Nausea Only   Outpatient Encounter Medications as of 06/30/2019  Medication Sig Note  . atorvastatin (LIPITOR) 10 MG tablet Take 1 tablet (10 mg total) by mouth daily.   . benazepril (LOTENSIN) 40 MG tablet Take 1 tablet (40 mg total) by mouth daily.   . carbamazepine (TEGRETOL XR) 200 MG 12 hr tablet 200 mg 2 (two) times daily. 08/16/2015: Received from: External Pharmacy Received Sig:   . empagliflozin (JARDIANCE) 25 MG TABS tablet Take 25 mg by mouth daily.   . furosemide (LASIX) 80 MG tablet Take 1 tablet (80 mg total) by mouth daily.   Marland Kitchen gabapentin (NEURONTIN) 600 MG tablet Take 1 tablet (600 mg total) by mouth 2 (two) times daily.   . metFORMIN (GLUCOPHAGE) 500 MG tablet Take 1 tablet (500 mg total) by mouth 2 (two) times daily with a meal.   . potassium chloride  (KLOR-CON 10) 10 MEQ tablet Take 1 tablet (10 mEq total) by mouth daily.   . QUEtiapine (SEROQUEL) 400 MG tablet Take 400 mg by mouth daily.  01/27/2015: Received from: [facility]  . tadalafil (CIALIS) 20 MG tablet Take 1 tablet (20 mg total) by mouth daily as needed for erectile dysfunction.   . [DISCONTINUED] ciprofloxacin (CIPRO) 250 MG tablet Take 1 tablet (250 mg total) by mouth 2 (two) times daily.   . [DISCONTINUED] meclizine (ANTIVERT) 25 MG tablet Take 1 tablet (25 mg total) by mouth 3 (three) times daily as needed for dizziness.   . [DISCONTINUED] promethazine (PHENERGAN) 25 MG tablet Take 1 tablet (25 mg total) by mouth every 8 (eight) hours as needed for nausea or vomiting.    No facility-administered encounter medications on file as of 06/30/2019.   Patient Active Problem List   Diagnosis Date Noted  . Purpura senilis (HCC) 05/14/2018  . Hypercholesterolemia 03/07/2017  . Weight gain following gastric bypass surgery 08/16/2015  . DM type 2 with diabetic peripheral neuropathy (HCC) 01/27/2015  . Hypertension 01/27/2015  . Hypogonadism in male 01/27/2015  . Bipolar 2 disorder (HCC) 01/27/2015  . Peripheral edema 01/27/2015   Past Medical History:  Diagnosis Date  . Diabetes mellitus without complication (HCC)   . Hypertension    Review of Systems  Constitutional: Negative.  Negative for activity change and fever.  Eyes: Negative.  Negative for  visual disturbance.  Cardiovascular: Negative.  Negative for chest pain.  Endocrine: Negative.  Negative for polydipsia, polyphagia and polyuria.  Neurological: Negative.  Negative for dizziness, weakness, light-headedness and numbness.   Per HPI unless specifically indicated above     Objective:    BP 128/78 (BP Location: Left Arm)   Pulse 80   Temp (!) 97.4 F (36.3 C) (Oral)   Wt 218 lb (98.9 kg)   SpO2 98%   BMI 31.28 kg/m   Wt Readings from Last 3 Encounters:  06/30/19 218 lb (98.9 kg)  11/15/18 228  lb (103.4 kg)  10/24/18 226 lb (102.5 kg)    Physical Exam Constitutional:      General: He is not in acute distress.    Appearance: Normal appearance. He is normal weight. He is not ill-appearing.  Eyes:     General: No scleral icterus.    Extraocular Movements: Extraocular movements intact.     Pupils: Pupils are equal, round, and reactive to light.  Cardiovascular:     Rate and Rhythm: Normal rate and regular rhythm.     Pulses: Normal pulses.     Heart sounds: No murmur.  Neurological:     General: No focal deficit present.     Mental Status: He is alert and oriented to person, place, and time.     Motor: No weakness.     Gait: Gait normal.  Psychiatric:        Mood and Affect: Mood normal.        Behavior: Behavior normal.        Thought Content: Thought content normal.        Judgment: Judgment normal.        Assessment & Plan:   Problem List Items Addressed This Visit      Endocrine   DM type 2 with diabetic peripheral neuropathy ([place]) - Primary    Chronic, ongoing.  Patient denies hypoglycemic episodes at home, checks blood sugar at least daily.  A1c checked today, continue current regimen for now.  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      Relevant Orders   Bayer DCA Hb A1c Waived       Follow up plan: Return in about 3 months (around 09/20/2019) for physical, labs, diabetes/htn follow up.

## 2019-06-30 NOTE — Assessment & Plan Note (Signed)
Chronic, ongoing.  Patient denies hypoglycemic episodes at home, checks blood sugar at least daily.  A1c checked today, continue current regimen for now.  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

## 2019-07-18 ENCOUNTER — Other Ambulatory Visit: Payer: Self-pay

## 2019-07-18 DIAGNOSIS — E119 Type 2 diabetes mellitus without complications: Secondary | ICD-10-CM

## 2019-07-18 DIAGNOSIS — E1142 Type 2 diabetes mellitus with diabetic polyneuropathy: Secondary | ICD-10-CM

## 2019-07-18 DIAGNOSIS — R609 Edema, unspecified: Secondary | ICD-10-CM

## 2019-07-18 DIAGNOSIS — I10 Essential (primary) hypertension: Secondary | ICD-10-CM

## 2019-07-18 MED ORDER — FUROSEMIDE 80 MG PO TABS: 80.0000 mg | ORAL_TABLET | Freq: Every day | ORAL | 3 refills | Status: DC

## 2019-07-18 MED ORDER — METFORMIN HCL 500 MG PO TABS: 500.0000 mg | ORAL_TABLET | Freq: Two times a day (BID) | ORAL | 3 refills | Status: DC

## 2019-07-18 MED ORDER — ATORVASTATIN CALCIUM 10 MG PO TABS: 10.0000 mg | ORAL_TABLET | Freq: Every day | ORAL | 3 refills | Status: DC

## 2019-07-18 MED ORDER — GABAPENTIN 600 MG PO TABS: 600.0000 mg | ORAL_TABLET | Freq: Two times a day (BID) | ORAL | 3 refills | Status: DC

## 2019-07-18 MED ORDER — BENAZEPRIL HCL 40 MG PO TABS: 40.0000 mg | ORAL_TABLET | Freq: Every day | ORAL | 3 refills | Status: DC

## 2019-07-18 NOTE — Telephone Encounter (Signed)
LOV: 06/30/2019  NOV: 09/19/2019

## 2019-07-18 NOTE — Telephone Encounter (Signed)
Fax from [facility] requesting medication refill on neurontin tab 600mg , lipitor tab 10 mg, furosemide tab 80 mg, benazepril tab 40 mg

## 2019-07-28 ENCOUNTER — Telehealth: Payer: Self-pay | Admitting: Nurse Practitioner

## 2019-07-28 ENCOUNTER — Encounter: Payer: Self-pay | Admitting: Nurse Practitioner

## 2019-07-28 NOTE — Telephone Encounter (Signed)
Please print the last office note and call to inform patient that the note is ready.

## 2019-07-29 NOTE — Telephone Encounter (Signed)
Pt informed note is ready for pick up. 

## 2019-08-13 DIAGNOSIS — S92309A Fracture of unspecified metatarsal bone(s), unspecified foot, initial encounter for closed fracture: Secondary | ICD-10-CM | POA: Insufficient documentation

## 2019-09-17 ENCOUNTER — Telehealth: Payer: Self-pay | Admitting: General Practice

## 2019-09-17 NOTE — Telephone Encounter (Signed)
Pt came into office stating he needed forms to be signed and faxed to employer Fax number release and form together, left in folder for provider to sign.

## 2019-09-18 NOTE — Telephone Encounter (Signed)
Form filled out and placed in folder for signature

## 2019-09-19 ENCOUNTER — Other Ambulatory Visit: Payer: Self-pay

## 2019-09-19 ENCOUNTER — Encounter: Payer: Managed Care, Other (non HMO) | Admitting: Nurse Practitioner

## 2019-09-19 ENCOUNTER — Encounter: Payer: Self-pay | Admitting: Nurse Practitioner

## 2019-09-19 ENCOUNTER — Ambulatory Visit (INDEPENDENT_AMBULATORY_CARE_PROVIDER_SITE_OTHER): Payer: BC Managed Care – PPO | Admitting: Nurse Practitioner

## 2019-09-19 VITALS — BP 121/69 | HR 98 | Temp 97.7°F | Wt 226.0 lb

## 2019-09-19 DIAGNOSIS — Z021 Encounter for pre-employment examination: Secondary | ICD-10-CM

## 2019-09-19 NOTE — Telephone Encounter (Signed)
Pt stated he would like a phone call when paper work is faxed.

## 2019-09-19 NOTE — Telephone Encounter (Signed)
Called pt ,Pt verbalized and confirmed understanding.

## 2019-09-19 NOTE — Telephone Encounter (Signed)
Paperwork faxed over to the fax number provided by the patient.

## 2019-09-19 NOTE — Progress Notes (Signed)
BP 121/69   Pulse 98   Temp 97.7 F (36.5 C) (Oral)   Wt 226 lb (102.5 kg)   SpO2 97%   BMI 32.43 kg/m    Subjective:    Patient ID: Derek Garza, male    DOB: 02-09-1961, 59 y.o.   MRN: [id number]  HPI: Derek Garza is a 59 y.o. male presenting for paperwork signature  Chief Complaint  Patient presents with  . Paperwork   HYPERTENSION / HYPERLIPIDEMIA Taking atorvastatin 10 mg and benazepril 40 mg with no concerns. Satisfied with current treatment? yes Duration of hypertension: chronic BP monitoring frequency: not checking BP medication side effects: no Past BP meds: benazepril Duration of hyperlipidemia: chronic Cholesterol medication side effects: no Cholesterol supplements: none Past cholesterol medications: atorvastatin Medication compliance: excellent compliance Aspirin: no Recent stressors: no Recurrent headaches: no Visual changes: no Palpitations: no Dyspnea: no Chest pain: no Lower extremity edema: no Dizzy/lightheaded: no   Patient states that his Psychiatrist manages the carbamaezepine and quetiapine.  Last A1c check was stable.  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    Allergies  Allergen Reactions  . Percocet [Oxycodone-Acetaminophen] Nausea Only   Outpatient Encounter Medications as of 09/19/2019  Medication Sig Note  . atorvastatin (LIPITOR) 10 MG tablet Take 1 tablet (10 mg total) by mouth daily.   . benazepril (LOTENSIN) 40 MG tablet Take 1 tablet (40 mg total) by mouth daily.   . carbamazepine (TEGRETOL XR) 200 MG 12 hr tablet 200 mg 2 (two) times daily. 08/16/2015: Received from: External Pharmacy Received Sig:   . empagliflozin (JARDIANCE) 25 MG TABS tablet Take 25 mg by mouth daily.   . furosemide (LASIX) 80 MG tablet Take 1 tablet (80 mg total) by mouth daily.   Marland Kitchen gabapentin (NEURONTIN) 600 MG tablet Take 1 tablet  (600 mg total) by mouth 2 (two) times daily.   . metFORMIN (GLUCOPHAGE) 500 MG tablet Take 1 tablet (500 mg total) by mouth 2 (two) times daily with a meal.   . potassium chloride (KLOR-CON 10) 10 MEQ tablet Take 1 tablet (10 mEq total) by mouth daily.   . QUEtiapine (SEROQUEL) 400 MG tablet Take 400 mg by mouth daily.  01/27/2015: Received from: [facility]  . tadalafil (CIALIS) 20 MG tablet Take 1 tablet (20 mg total) by mouth daily as needed for erectile dysfunction.    No facility-administered encounter medications on file as of 09/19/2019.   Patient Active Problem List   Diagnosis Date Noted  . 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 Hypercholesterolemia 03/07/2017  . Weight gain following gastric bypass surgery 08/16/2015  . DM type 2 with diabetic peripheral neuropathy ([place]) 01/27/2015  . Hypertension 01/27/2015  . Hypogonadism in male 01/27/2015  . Bipolar 2 disorder ([place]) 01/27/2015  . Peripheral edema 01/27/2015   Past Medical History:  Diagnosis Date  . Diabetes mellitus without complication ([place])   . Hypertension    Relevant past medical, surgical, family and social history reviewed and updated as indicated. Interim medical history since our last visit reviewed.  Review of Systems  Constitutional: Negative.  Negative for fever.  Respiratory: Negative.  Negative for shortness of breath.   Cardiovascular: Negative.  Negative for chest pain.  Gastrointestinal: Negative.   Neurological: Negative.   Psychiatric/Behavioral: Negative.     Per HPI unless specifically indicated above     Objective:  BP 121/69   Pulse 98   Temp 97.7 F (36.5 C) (Oral)   Wt 226 lb (102.5 kg)   SpO2 97%   BMI 32.43 kg/m   Wt Readings from Last 3 Encounters:  09/19/19 226 lb (102.5 kg)  06/30/19 218 lb (98.9 kg)  11/15/18 228 lb (103.4 kg)    Physical Exam Vitals and nursing note reviewed.  Constitutional:      General: He is not in acute distress.    Appearance:  Normal appearance. He is not toxic-appearing.  Eyes:     General: No scleral icterus.    Extraocular Movements: Extraocular movements intact.  Cardiovascular:     Rate and Rhythm: Normal rate.  Pulmonary:     Effort: Pulmonary effort is normal. No respiratory distress.     Breath sounds: Normal breath sounds.  Abdominal:     General: Abdomen is flat.  Musculoskeletal:        General: Normal range of motion.     Cervical back: Normal range of motion. No rigidity.  Skin:    Coloration: Skin is not jaundiced or pale.  Neurological:     General: No focal deficit present.     Mental Status: He is alert and oriented to person, place, and time.     Motor: No weakness.     Gait: Gait normal.  Psychiatric:        Mood and Affect: Mood normal.        Behavior: Behavior normal.        Thought Content: Thought content normal.        Judgment: Judgment normal.    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       Follow up plan: Return in about 2 months (around 11/19/2019) for CPE.

## 2019-10-13 ENCOUNTER — Encounter: Payer: BC Managed Care – PPO | Admitting: Nurse Practitioner

## 2019-11-03 ENCOUNTER — Encounter: Payer: Managed Care, Other (non HMO) | Admitting: Family Medicine

## 2019-11-18 ENCOUNTER — Other Ambulatory Visit: Payer: Self-pay

## 2019-11-18 ENCOUNTER — Ambulatory Visit (INDEPENDENT_AMBULATORY_CARE_PROVIDER_SITE_OTHER): Payer: BC Managed Care – PPO | Admitting: Nurse Practitioner

## 2019-11-18 ENCOUNTER — Encounter: Payer: Self-pay | Admitting: Nurse Practitioner

## 2019-11-18 VITALS — BP 148/79 | HR 82 | Temp 98.1°F | Ht 70.5 in | Wt 224.8 lb

## 2019-11-18 DIAGNOSIS — Z13220 Encounter for screening for lipoid disorders: Secondary | ICD-10-CM

## 2019-11-18 DIAGNOSIS — Z1329 Encounter for screening for other suspected endocrine disorder: Secondary | ICD-10-CM | POA: Diagnosis not present

## 2019-11-18 DIAGNOSIS — Z136 Encounter for screening for cardiovascular disorders: Secondary | ICD-10-CM

## 2019-11-18 DIAGNOSIS — Z125 Encounter for screening for malignant neoplasm of prostate: Secondary | ICD-10-CM | POA: Diagnosis not present

## 2019-11-18 DIAGNOSIS — E1142 Type 2 diabetes mellitus with diabetic polyneuropathy: Secondary | ICD-10-CM

## 2019-11-18 DIAGNOSIS — Z0000 Encounter for general adult medical examination without abnormal findings: Secondary | ICD-10-CM | POA: Diagnosis not present

## 2019-11-18 NOTE — Patient Instructions (Signed)
Preventive Care 41-60 Years Old, Male Preventive care refers to lifestyle choices and visits with your health care provider that can promote health and wellness. This includes:  A yearly physical exam. This is also called an annual well check.  Regular dental and eye exams.  Immunizations.  Screening for certain conditions.  Healthy lifestyle choices, such as eating a healthy diet, getting regular exercise, not using drugs or products that contain nicotine and tobacco, and limiting alcohol use. What can I expect for my preventive care visit? Physical exam Your health care provider will check:  Height and weight. These may be used to calculate body mass index (BMI), which is a measurement that tells if you are at a healthy weight.  Heart rate and blood pressure.  Your skin for abnormal spots. Counseling Your health care provider may ask you questions about:  Alcohol, tobacco, and drug use.  Emotional well-being.  Home and relationship well-being.  Sexual activity.  Eating habits.  Work and work Statistician. What immunizations do I need?  Influenza (flu) vaccine  This is recommended every year. Tetanus, diphtheria, and pertussis (Tdap) vaccine  You may need a Td booster every 10 years. Varicella (chickenpox) vaccine  You may need this vaccine if you have not already been vaccinated. Zoster (shingles) vaccine  You may need this after age 64. Measles, mumps, and rubella (MMR) vaccine  You may need at least one dose of MMR if you were born in 1957 or later. You may also need a second dose. Pneumococcal conjugate (PCV13) vaccine  You may need this if you have certain conditions and were not previously vaccinated. Pneumococcal polysaccharide (PPSV23) vaccine  You may need one or two doses if you smoke cigarettes or if you have certain conditions. Meningococcal conjugate (MenACWY) vaccine  You may need this if you have certain conditions. Hepatitis A  vaccine  You may need this if you have certain conditions or if you travel or work in places where you may be exposed to hepatitis A. Hepatitis B vaccine  You may need this if you have certain conditions or if you travel or work in places where you may be exposed to hepatitis B. Haemophilus influenzae type b (Hib) vaccine  You may need this if you have certain risk factors. Human papillomavirus (HPV) vaccine  If recommended by your health care provider, you may need three doses over 6 months. You may receive vaccines as individual doses or as more than one vaccine together in one shot (combination vaccines). Talk with your health care provider about the risks and benefits of combination vaccines. What tests do I need? Blood tests  Lipid and cholesterol levels. These may be checked every 5 years, or more frequently if you are over 60 years old.  Hepatitis C test.  Hepatitis B test. Screening  Lung cancer screening. You may have this screening every year starting at age 43 if you have a 30-pack-year history of smoking and currently smoke or have quit within the past 15 years.  Prostate cancer screening. Recommendations will vary depending on your family history and other risks.  Colorectal cancer screening. All adults should have this screening starting at age 72 and continuing until age 2. Your health care provider may recommend screening at age 14 if you are at increased risk. You will have tests every 1-10 years, depending on your results and the type of screening test.  Diabetes screening. This is done by checking your blood sugar (glucose) after you have not eaten  for a while (fasting). You may have this done every 1-3 years.  Sexually transmitted disease (STD) testing. Follow these instructions at home: Eating and drinking  Eat a diet that includes fresh fruits and vegetables, whole grains, lean protein, and low-fat dairy products.  Take vitamin and mineral supplements as  recommended by your health care provider.  Do not drink alcohol if your health care provider tells you not to drink.  If you drink alcohol: ? Limit how much you have to 0-2 drinks a day. ? Be aware of how much alcohol is in your drink. In the [place] one drink equals one 12 oz bottle of beer (355 mL), one 5 oz glass of wine (148 mL), or one 1 oz glass of hard liquor (44 mL). Lifestyle  Take daily care of your teeth and gums.  Stay active. Exercise for at least 30 minutes on 5 or more days each week.  Do not use any products that contain nicotine or tobacco, such as cigarettes, e-cigarettes, and chewing tobacco. If you need help quitting, ask your health care provider.  If you are sexually active, practice safe sex. Use a condom or other form of protection to prevent STIs (sexually transmitted infections).  Talk with your health care provider about taking a low-dose aspirin every day starting at age 53. What's next?  Go to your health care provider once a year for a well check visit.  Ask your health care provider how often you should have your eyes and teeth checked.  Stay up to date on all vaccines. This information is not intended to replace advice given to you by your health care provider. Make sure you discuss any questions you have with your health care provider. Document Revised: 05/09/2018 Document Reviewed: 05/09/2018 Elsevier Patient Education  2020 [employer].

## 2019-11-18 NOTE — Progress Notes (Signed)
BP (!) 148/79 (BP Location: Left Arm, Patient Position: Sitting, Cuff Size: Normal)   Pulse 82   Temp 98.1 F (36.7 C) (Oral)   Ht 5' 10.5" (1.791 m)   Wt 224 lb 12.8 oz (102 kg)   SpO2 99%   BMI 31.80 kg/m    Subjective:    Patient ID: Greg Cratty, male    DOB: 10/13/1960, 59 y.o.   MRN: [id number]  HPI: Rj Pedrosa is a 59 y.o. male presenting on 11/18/2019 for comprehensive medical examination. Current medical complaints include: none.  Does report he forgot his blood pressure medication today.  He currently lives with: self; and cat Interim Problems from his last visit: no  Depression Screen done today and results listed below:  Depression screen [facility] 2/9 11/18/2019 08/31/2016 08/16/2015  Decreased Interest 0 0 0  Down, Depressed, Hopeless 0 0 0  PHQ - 2 Score 0 0 0  Altered sleeping 0 - 0  Tired, decreased energy 0 - 2  Change in appetite 0 - 0  Feeling bad or failure about yourself  0 - 0  Trouble concentrating 0 - 0  Moving slowly or fidgety/restless 0 - 0  Suicidal thoughts 0 - 0  PHQ-9 Score 0 - 2  Difficult doing work/chores Not difficult at all - -   The patient does not have a history of falls. I did not complete a risk assessment for falls. A plan of care for falls was not documented.   Past Medical History:  Past Medical History:  Diagnosis Date  . Diabetes mellitus without complication ([place])   . Hypertension     Surgical History:  Past Surgical History:  Procedure Laterality Date  . ANKLE SURGERY  1979  . APPENDECTOMY  2011  . HERNIA REPAIR  [id number]  . VASECTOMY      Medications:  Current Outpatient Medications on File Prior to Visit  Medication Sig  . atorvastatin (LIPITOR) 10 MG tablet Take 1 tablet (10 mg total) by mouth daily.  . benazepril (LOTENSIN) 40 MG tablet Take 1 tablet (40 mg total) by mouth daily.  . carbamazepine (TEGRETOL XR) 200 MG 12 hr tablet 200 mg 2 (two) times daily.  . empagliflozin (JARDIANCE) 25 MG TABS tablet  Take 25 mg by mouth daily.  . furosemide (LASIX) 80 MG tablet Take 1 tablet (80 mg total) by mouth daily.  Marland Kitchen gabapentin (NEURONTIN) 600 MG tablet Take 1 tablet (600 mg total) by mouth 2 (two) times daily.  . metFORMIN (GLUCOPHAGE) 500 MG tablet Take 1 tablet (500 mg total) by mouth 2 (two) times daily with a meal.  . potassium chloride (KLOR-CON 10) 10 MEQ tablet Take 1 tablet (10 mEq total) by mouth daily.  . QUEtiapine (SEROQUEL) 400 MG tablet Take 400 mg by mouth daily.   . tadalafil (CIALIS) 20 MG tablet Take 1 tablet (20 mg total) by mouth daily as needed for erectile dysfunction.   No current facility-administered medications on file prior to visit.    Allergies:  Allergies  Allergen Reactions  . Percocet [Oxycodone-Acetaminophen] Nausea Only    Social History:  Social History   Socioeconomic History  . Marital status: Married    Spouse name: Not on file  . Number of children: Not on file  . Years of education: Not on file  . Highest education level: Not on file  Occupational History  . Not on file  Tobacco Use  . Smoking status: Never Smoker  . Smokeless tobacco: Never Used  Vaping Use  . Vaping Use: Never used  Substance and Sexual Activity  . Alcohol use: Yes    Comment: on occasion  . Drug use: No  . Sexual activity: Not on file  Other Topics Concern  . Not on file  Social History Narrative  . Not on file   Social Determinants of Health   Financial Resource Strain:   . Difficulty of Paying Living Expenses:   Food Insecurity:   . Worried About Programme researcher, broadcasting/film/video in the Last Year:   . Barista in the Last Year:   Transportation Needs:   . Freight forwarder (Medical):   Marland Kitchen Lack of Transportation (Non-Medical):   Physical Activity:   . Days of Exercise per Week:   . Minutes of Exercise per Session:   Stress:   . Feeling of Stress :   Social Connections:   . Frequency of Communication with Friends and Family:   . Frequency of Social  Gatherings with Friends and Family:   . Attends Religious Services:   . Active Member of Clubs or Organizations:   . Attends Banker Meetings:   Marland Kitchen Marital Status:   Intimate Partner Violence:   . Fear of Current or Ex-Partner:   . Emotionally Abused:   Marland Kitchen Physically Abused:   . Sexually Abused:    Social History   Tobacco Use  Smoking Status Never Smoker  Smokeless Tobacco Never Used   Social History   Substance and Sexual Activity  Alcohol Use Yes   Comment: on occasion    Family History:  Family History  Problem Relation Age of Onset  . Breast cancer Sister     Past medical history, surgical history, medications, allergies, family history and social history reviewed with patient today and changes made to appropriate areas of the chart.   Review of Systems  Constitutional: Negative.  Negative for chills, fever and weight loss.  HENT: Negative.  Negative for ear pain, hearing loss, sinus pain and sore throat.   Eyes: Negative.  Negative for blurred vision, double vision, pain and redness.  Respiratory: Negative.  Negative for cough, shortness of breath and wheezing.   Cardiovascular: Negative.  Negative for chest pain, palpitations and leg swelling.  Gastrointestinal: Negative.  Negative for abdominal pain, blood in stool, diarrhea, nausea and vomiting.  Genitourinary: Negative.  Negative for dysuria and hematuria.  Musculoskeletal: Negative.  Negative for back pain, joint pain, myalgias and neck pain.  Skin: Negative.  Negative for itching and rash.  Neurological: Negative.  Negative for dizziness, weakness and headaches.  Psychiatric/Behavioral: Negative.  Negative for depression. The patient is not nervous/anxious and does not have insomnia.    All other ROS negative except what is listed above and in the HPI.      Objective:    BP (!) 148/79 (BP Location: Left Arm, Patient Position: Sitting, Cuff Size: Normal)   Pulse 82   Temp 98.1 F (36.7 C)  (Oral)   Ht 5' 10.5" (1.791 m)   Wt 224 lb 12.8 oz (102 kg)   SpO2 99%   BMI 31.80 kg/m   Wt Readings from Last 3 Encounters:  11/18/19 224 lb 12.8 oz (102 kg)  09/19/19 226 lb (102.5 kg)  06/30/19 218 lb (98.9 kg)    Physical Exam Vitals and nursing note reviewed.  Constitutional:      Appearance: Normal appearance. He is normal weight.  HENT:     Head: Normocephalic.  Right Ear: Tympanic membrane, ear canal and external ear normal.     Left Ear: Tympanic membrane, ear canal and external ear normal.     Nose: Nose normal. No congestion.     Mouth/Throat:     Mouth: Mucous membranes are moist.     Pharynx: Oropharynx is clear. No oropharyngeal exudate or posterior oropharyngeal erythema.  Eyes:     General: No scleral icterus.    Extraocular Movements: Extraocular movements intact.     Pupils: Pupils are equal, round, and reactive to light.  Neck:     Vascular: No carotid bruit.  Cardiovascular:     Rate and Rhythm: Normal rate and regular rhythm.     Heart sounds: Normal heart sounds. No murmur heard.   Pulmonary:     Effort: Pulmonary effort is normal. No respiratory distress.     Breath sounds: Normal breath sounds. No wheezing or rhonchi.  Abdominal:     General: Abdomen is flat. Bowel sounds are normal. There is no distension.     Palpations: Abdomen is soft.     Tenderness: There is no abdominal tenderness.  Musculoskeletal:        General: No swelling or tenderness. Normal range of motion.     Cervical back: Normal range of motion and neck supple. No tenderness.     Right lower leg: No edema.     Left lower leg: No edema.  Skin:    General: Skin is warm and dry.     Coloration: Skin is not jaundiced.     Findings: No lesion.  Neurological:     General: No focal deficit present.     Mental Status: He is alert and oriented to person, place, and time. Mental status is at baseline.     Motor: No weakness.     Gait: Gait normal.  Psychiatric:        Mood  and Affect: Mood normal.        Behavior: Behavior normal.        Thought Content: Thought content normal.        Judgment: Judgment normal.       Assessment & Plan:   Problem List Items Addressed This Visit    None    Visit Diagnoses    Annual physical exam    -  Primary   Relevant Orders   CBC with Differential/Platelet   Comprehensive metabolic panel   Screening for thyroid disorder       Relevant Orders   TSH   Encounter for lipid screening for cardiovascular disease       Relevant Orders   Lipid Panel w/o Chol/HDL Ratio   Encounter for prostate cancer screening       Relevant Orders   PSA      LABORATORY TESTING:  Health maintenance labs ordered today as discussed above.   The natural history of prostate cancer and ongoing controversy regarding screening and potential treatment outcomes of prostate cancer has been discussed with the patient. The meaning of a false positive PSA and a false negative PSA has been discussed. He indicates understanding of the limitations of this screening test and wishes to proceed with screening PSA testing.   IMMUNIZATIONS:    - Tdap: Tetanus vaccination status reviewed: last tetanus booster within 10 years. - Influenza: Postponed to flu season - Pneumovax: Up to date - Prevnar: Up to date - HPV: Not applicable - Zostavax vaccine: Not applicable  SCREENING: - Colonoscopy: Up to date  Discussed with patient purpose of the colonoscopy is to detect colon cancer at curable precancerous or early stages   - AAA Screening: Not applicable  -Hearing Test: Not applicable  -Spirometry: Not applicable   PATIENT COUNSELING:    Sexuality: Discussed sexually transmitted diseases, partner selection, use of condoms, avoidance of unintended pregnancy  and contraceptive alternatives.   Advised to avoid cigarette smoking.  I discussed with the patient that most people either abstain from alcohol or drink within safe limits (<=14/week and <=4  drinks/occasion for males, <=7/weeks and <= 3 drinks/occasion for females) and that the risk for alcohol disorders and other health effects rises proportionally with the number of drinks per week and how often a drinker exceeds daily limits.  Discussed cessation/primary prevention of drug use and availability of treatment for abuse.   Diet: Encouraged to adjust caloric intake to maintain  or achieve ideal body weight, to reduce intake of dietary saturated fat and total fat, to limit sodium intake by avoiding high sodium foods and not adding table salt, and to maintain adequate dietary potassium and calcium preferably from fresh fruits, vegetables, and low-fat dairy products.    stressed the importance of regular exercise  Injury prevention: Discussed safety belts, safety helmets, smoke detector, smoking near bedding or upholstery.   Dental health: Discussed importance of regular tooth brushing, flossing, and dental visits.   Follow up plan: NEXT PREVENTATIVE PHYSICAL DUE IN 1 YEAR. Return in about 3 months (around 02/18/2020) for DM/HTN/HLD f/u.

## 2019-11-19 ENCOUNTER — Telehealth: Payer: Self-pay | Admitting: Nurse Practitioner

## 2019-11-19 LAB — COMPREHENSIVE METABOLIC PANEL
ALT: 25 IU/L (ref 0–44)
AST: 23 IU/L (ref 0–40)
Albumin/Globulin Ratio: 1.7 (ref 1.2–2.2)
Albumin: 4.2 g/dL (ref 3.8–4.9)
Alkaline Phosphatase: 117 IU/L (ref 48–121)
BUN/Creatinine Ratio: 16 (ref 10–24)
BUN: 13 mg/dL (ref 8–27)
Bilirubin Total: <0.2 mg/dL (ref 0.0–1.2)
CO2: 23 mmol/L (ref 20–29)
Calcium: 8.8 mg/dL (ref 8.6–10.2)
Chloride: 99 mmol/L (ref 96–106)
Creatinine, Ser: 0.82 mg/dL (ref 0.76–1.27)
GFR calc Af Amer: 111 mL/min/{1.73_m2} (ref >59)
GFR calc non Af Amer: 96 mL/min/{1.73_m2} (ref >59)
Globulin, Total: 2.5 g/dL (ref 1.5–4.5)
Glucose: 160 mg/dL — ABNORMAL HIGH (ref 65–99)
Potassium: 4.8 mmol/L (ref 3.5–5.2)
Sodium: 135 mmol/L (ref 134–144)
Total Protein: 6.7 g/dL (ref 6.0–8.5)

## 2019-11-19 LAB — CBC WITH DIFFERENTIAL/PLATELET
Basophils Absolute: 0 10*3/uL (ref 0.0–0.2)
Basos: 1 %
EOS (ABSOLUTE): 0.1 10*3/uL (ref 0.0–0.4)
Eos: 2 %
Hematocrit: 43.3 % (ref 37.5–51.0)
Hemoglobin: 14.3 g/dL (ref 13.0–17.7)
Immature Grans (Abs): 0 10*3/uL (ref 0.0–0.1)
Immature Granulocytes: 0 %
Lymphocytes Absolute: 1.7 10*3/uL (ref 0.7–3.1)
Lymphs: 31 %
MCH: 29.1 pg (ref 26.6–33.0)
MCHC: 33 g/dL (ref 31.5–35.7)
MCV: 88 fL (ref 79–97)
Monocytes Absolute: 0.8 10*3/uL (ref 0.1–0.9)
Monocytes: 15 %
Neutrophils Absolute: 2.8 10*3/uL (ref 1.4–7.0)
Neutrophils: 51 %
Platelets: 313 10*3/uL (ref 150–450)
RBC: 4.91 x10E6/uL (ref 4.14–5.80)
RDW: 12.1 % (ref 11.6–15.4)
WBC: 5.4 10*3/uL (ref 3.4–10.8)

## 2019-11-19 LAB — TSH: TSH: 2.18 u[IU]/mL (ref 0.450–4.500)

## 2019-11-19 LAB — LIPID PANEL W/O CHOL/HDL RATIO
Cholesterol, Total: 141 mg/dL (ref 100–199)
HDL: 46 mg/dL (ref >39)
LDL Chol Calc (NIH): 79 mg/dL (ref 0–99)
Triglycerides: 81 mg/dL (ref 0–149)
VLDL Cholesterol Cal: 16 mg/dL (ref 5–40)

## 2019-11-19 LAB — PSA: Prostate Specific Ag, Serum: 2.8 ng/mL (ref 0.0–4.0)

## 2019-11-19 NOTE — Addendum Note (Signed)
Addended by: Mardene Celeste I on: 11/19/2019 11:42 AM   Modules accepted: Orders

## 2019-11-19 NOTE — Telephone Encounter (Signed)
Pt called and is requesting to have his prescriptions physically written out so that he can pick them up in the office since he travels for work. Please advise.

## 2019-11-20 ENCOUNTER — Other Ambulatory Visit: Payer: Self-pay | Admitting: Nurse Practitioner

## 2019-11-20 DIAGNOSIS — R609 Edema, unspecified: Secondary | ICD-10-CM

## 2019-11-20 DIAGNOSIS — R600 Localized edema: Secondary | ICD-10-CM

## 2019-11-20 DIAGNOSIS — E291 Testicular hypofunction: Secondary | ICD-10-CM

## 2019-11-20 DIAGNOSIS — I10 Essential (primary) hypertension: Secondary | ICD-10-CM

## 2019-11-20 DIAGNOSIS — E119 Type 2 diabetes mellitus without complications: Secondary | ICD-10-CM

## 2019-11-20 DIAGNOSIS — E1142 Type 2 diabetes mellitus with diabetic polyneuropathy: Secondary | ICD-10-CM

## 2019-11-20 MED ORDER — FUROSEMIDE 80 MG PO TABS: 80.0000 mg | ORAL_TABLET | Freq: Every day | ORAL | 0 refills | Status: DC

## 2019-11-20 MED ORDER — TADALAFIL 20 MG PO TABS: 20.0000 mg | ORAL_TABLET | Freq: Every day | ORAL | 0 refills | Status: DC | PRN

## 2019-11-20 MED ORDER — BENAZEPRIL HCL 40 MG PO TABS: 40.0000 mg | ORAL_TABLET | Freq: Every day | ORAL | 0 refills | Status: DC

## 2019-11-20 MED ORDER — EMPAGLIFLOZIN 25 MG PO TABS: 25.0000 mg | ORAL_TABLET | Freq: Every day | ORAL | 0 refills | Status: DC

## 2019-11-20 MED ORDER — GABAPENTIN 600 MG PO TABS: 600.0000 mg | ORAL_TABLET | Freq: Two times a day (BID) | ORAL | 0 refills | Status: DC

## 2019-11-20 MED ORDER — POTASSIUM CHLORIDE ER 10 MEQ PO TBCR: 10.0000 meq | EXTENDED_RELEASE_TABLET | Freq: Every day | ORAL | 0 refills | Status: DC

## 2019-11-20 MED ORDER — METFORMIN HCL 500 MG PO TABS: 500.0000 mg | ORAL_TABLET | Freq: Two times a day (BID) | ORAL | 0 refills | Status: DC

## 2019-11-20 MED ORDER — ATORVASTATIN CALCIUM 10 MG PO TABS: 10.0000 mg | ORAL_TABLET | Freq: Every day | ORAL | 0 refills | Status: DC

## 2019-11-20 NOTE — Telephone Encounter (Signed)
Can all of the patient's prescriptions be printed please?

## 2019-11-20 NOTE — Telephone Encounter (Signed)
Called and left patient a VM letting him know that his prescriptions were printed and ready to be picked up.

## 2019-11-20 NOTE — Progress Notes (Signed)
Printed and signed prescriptions.

## 2019-11-21 ENCOUNTER — Encounter: Payer: Self-pay | Admitting: Nurse Practitioner

## 2019-11-21 ENCOUNTER — Telehealth: Payer: Self-pay | Admitting: Nurse Practitioner

## 2019-11-21 LAB — HEMOGLOBIN A1C
Est. average glucose Bld gHb Est-mCnc: 163 mg/dL
Hgb A1c MFr Bld: 7.3 % — ABNORMAL HIGH (ref 4.8–5.6)

## 2019-11-21 LAB — SPECIMEN STATUS REPORT

## 2019-11-21 NOTE — Telephone Encounter (Signed)
Called patient to discuss labs - all normal except HgbA1c has increased from 6.6% in February to 7.3%.  Patient reports he has been struggling to take his pills in the morning and that he sometimes forgets to take his Jardiance.  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  I encouraged him to continue lifestyle modifications, increase physical activity and try to take all medications as prescribed.

## 2020-02-17 ENCOUNTER — Ambulatory Visit: Payer: Self-pay | Admitting: Nurse Practitioner

## 2020-03-01 NOTE — Progress Notes (Signed)
BP (!) 153/85 (BP Location: Left Arm, Patient Position: Sitting)   Pulse 71   Temp 98.4 F (36.9 C) (Oral)   Ht 5\' 10"  (1.778 m)   Wt 234 lb (106.1 kg)   SpO2 98%   BMI 33.58 kg/m    Subjective:    Patient ID: Derek Garza, male    DOB: September 03, 1960, 59 y.o.   MRN: [id number]  HPI: Derek Garza is a 59 y.o. male presenting for follow up.  Chief Complaint  Patient presents with  . Hypertension    follow up   . Hyperlipidemia  . Diabetes  . Medication Refill    wants rf and also to change the qty pt said 20 in not enough for a month   . Flu Vaccine    completed   HYPERTENSION / HYPERLIPIDEMIA Benazepril 40 mg, furosemide 80 mg, atorvastatin 10 mg Satisfied with current treatment? yes Duration of hypertension: chronic BP monitoring frequency: not checking BP medication side effects: no Past BP meds: benazepril 40 mg Duration of hyperlipidemia: chronic Cholesterol medication side effects: no Cholesterol supplements: none Past cholesterol medications: atorvastatin 10 mg Medication compliance: excellent compliance Aspirin: no;  Recent stressors: no Recurrent headaches: no Visual changes: no Palpitations: no Dyspnea: no Chest pain: no Lower extremity edema: no Dizzy/lightheaded: no  DIABETES Currently taking Metformin 500 mg bid, gabapentin, Jardiance 25 mg daily Hypoglycemic episodes:no Polydipsia/polyuria: no Visual disturbance: no Chest pain: no Paresthesias: no Glucose Monitoring: yes  Accucheck frequency: Daily; random  Post prandial: 105 Blood Pressure Monitoring: not checking Retinal Examination: Not up to Date Foot Exam: Up to Date Diabetic Education: Completed Pneumovax: Up to Date Influenza: Up to Date Aspirin: no  Allergies  Allergen Reactions  . Percocet [Oxycodone-Acetaminophen] Nausea Only   Outpatient Encounter Medications as of 03/02/2020  Medication Sig Note  . atorvastatin (LIPITOR) 10 MG tablet Take 1 tablet (10 mg total) by  mouth daily.   . benazepril (LOTENSIN) 40 MG tablet Take 1 tablet (40 mg total) by mouth daily.   . carbamazepine (TEGRETOL XR) 200 MG 12 hr tablet 200 mg 2 (two) times daily. 08/16/2015: Received from: External Pharmacy Received Sig:   . empagliflozin (JARDIANCE) 25 MG TABS tablet Take 1 tablet (25 mg total) by mouth daily.   . furosemide (LASIX) 80 MG tablet Take 1 tablet (80 mg total) by mouth daily.   08/18/2015 gabapentin (NEURONTIN) 600 MG tablet Take 1 tablet (600 mg total) by mouth 2 (two) times daily.   . metFORMIN (GLUCOPHAGE) 500 MG tablet Take 1 tablet (500 mg total) by mouth 2 (two) times daily with a meal.   . potassium chloride (KLOR-CON 10) 10 MEQ tablet Take 1 tablet (10 mEq total) by mouth daily.   . QUEtiapine (SEROQUEL) 400 MG tablet Take 400 mg by mouth daily.  01/27/2015: Received from: [facility]  . tadalafil (CIALIS) 20 MG tablet Take 1 tablet (20 mg total) by mouth daily as needed for erectile dysfunction.   . [DISCONTINUED] atorvastatin (LIPITOR) 10 MG tablet Take 1 tablet (10 mg total) by mouth daily.   . [DISCONTINUED] benazepril (LOTENSIN) 40 MG tablet Take 1 tablet (40 mg total) by mouth daily.   . [DISCONTINUED] empagliflozin (JARDIANCE) 25 MG TABS tablet Take 1 tablet (25 mg total) by mouth daily.   . [DISCONTINUED] furosemide (LASIX) 80 MG tablet Take 1 tablet (80 mg total) by mouth daily.   . [DISCONTINUED] gabapentin (NEURONTIN) 600 MG tablet Take 1 tablet (600 mg total) by mouth  2 (two) times daily.   . [DISCONTINUED] metFORMIN (GLUCOPHAGE) 500 MG tablet Take 1 tablet (500 mg total) by mouth 2 (two) times daily with a meal.   . [DISCONTINUED] potassium chloride (KLOR-CON 10) 10 MEQ tablet Take 1 tablet (10 mEq total) by mouth daily.   . [DISCONTINUED] tadalafil (CIALIS) 20 MG tablet Take 1 tablet (20 mg total) by mouth daily as needed for erectile dysfunction.    No facility-administered encounter medications on file as of 03/02/2020.   Patient Active  Problem List   Diagnosis Date Noted  . Erectile dysfunction 03/02/2020  . Purpura senilis (HCC) 05/14/2018  . Hypercholesterolemia 03/07/2017  . Weight gain following gastric bypass surgery 08/16/2015  . DM type 2 with diabetic peripheral neuropathy (HCC) 01/27/2015  . Hypertension 01/27/2015  . Hypogonadism in male 01/27/2015  . Bipolar 2 disorder (HCC) 01/27/2015  . Peripheral edema 01/27/2015   Past Medical History:  Diagnosis Date  . Diabetes mellitus without complication (HCC)   . Hypertension    Relevant past medical, surgical, family and social history reviewed and updated as indicated. Interim medical history since our last visit reviewed.  Review of Systems  Constitutional: Negative.  Negative for activity change, appetite change, diaphoresis, fatigue and fever.  Eyes: Negative.  Negative for visual disturbance.  Respiratory: Negative.  Negative for cough, shortness of breath and wheezing.   Cardiovascular: Negative.  Negative for chest pain, palpitations and leg swelling.  Gastrointestinal: Negative.  Negative for abdominal pain, blood in stool, constipation, diarrhea, nausea and vomiting.  Endocrine: Negative.   Musculoskeletal: Negative.   Skin: Negative.  Negative for rash.  Neurological: Negative.  Negative for dizziness, weakness, light-headedness and headaches.  Psychiatric/Behavioral: Negative.  Negative for confusion, self-injury and sleep disturbance.    Per HPI unless specifically indicated above     Objective:    BP (!) 153/85 (BP Location: Left Arm, Patient Position: Sitting)   Pulse 71   Temp 98.4 F (36.9 C) (Oral)   Ht 5\' 10"  (1.778 m)   Wt 234 lb (106.1 kg)   SpO2 98%   BMI 33.58 kg/m   Wt Readings from Last 3 Encounters:  03/02/20 234 lb (106.1 kg)  11/18/19 224 lb 12.8 oz (102 kg)  09/19/19 226 lb (102.5 kg)    Physical Exam Vitals and nursing note reviewed.  Constitutional:      General: He is not in acute distress.    Appearance:  Normal appearance. He is obese.  Eyes:     General: No scleral icterus.    Extraocular Movements: Extraocular movements intact.  Neck:     Vascular: No carotid bruit.  Cardiovascular:     Rate and Rhythm: Normal rate.     Heart sounds: Normal heart sounds. No murmur heard.   Pulmonary:     Effort: Pulmonary effort is normal. No respiratory distress.     Breath sounds: Normal breath sounds. No wheezing or rhonchi.  Abdominal:     General: Abdomen is flat. Bowel sounds are normal. There is no distension.  Musculoskeletal:        General: Normal range of motion.     Cervical back: Normal range of motion.     Right lower leg: No edema.     Left lower leg: No edema.  Skin:    General: Skin is warm and dry.     Coloration: Skin is not jaundiced or pale.     Findings: Bruising present. No erythema.  Neurological:     General:  No focal deficit present.     Mental Status: He is alert and oriented to person, place, and time.     Motor: No weakness.     Gait: Gait normal.  Psychiatric:        Mood and Affect: Mood normal.        Behavior: Behavior normal.        Thought Content: Thought content normal.        Judgment: Judgment normal.       Assessment & Plan:   Problem List Items Addressed This Visit      Cardiovascular and Mediastinum   Hypertension    Chronic, uncontrolled today in office.  Patient did not take medication yet today.  Encourage patient to obtain a blood pressure cuff at home and check blood pressure at home.  Continue benazepril 40 mg daily and furosemide 80 mg daily for now.  Goal blood pressure less than 130/80.  Return to clinic if blood pressure greater than 140/90 consistently at home.  BMP checked today.  Follow-up in 3 months      Relevant Medications   tadalafil (CIALIS) 20 MG tablet   atorvastatin (LIPITOR) 10 MG tablet   benazepril (LOTENSIN) 40 MG tablet   furosemide (LASIX) 80 MG tablet   Purpura senilis (HCC)    Chronic, stable-reassurance  provided.      Relevant Medications   tadalafil (CIALIS) 20 MG tablet   atorvastatin (LIPITOR) 10 MG tablet   benazepril (LOTENSIN) 40 MG tablet   furosemide (LASIX) 80 MG tablet     Endocrine   DM type 2 with diabetic peripheral neuropathy (HCC) - Primary    Chronic, stable.  Reports good home blood sugar readings-we will check A1c today.  Continue Metformin 500 mg twice daily and Jardiance 25 mg daily for now.  Refills given.  Gabapentin seems to be controlling neuropathy well-we will continue this, refills given.  Recommended starting baby aspirin daily for cardiovascular protection.  Follow-up in 3 months.      Relevant Medications   atorvastatin (LIPITOR) 10 MG tablet   empagliflozin (JARDIANCE) 25 MG TABS tablet   benazepril (LOTENSIN) 40 MG tablet   metFORMIN (GLUCOPHAGE) 500 MG tablet   gabapentin (NEURONTIN) 600 MG tablet   Other Relevant Orders   HgB A1c   Basic Metabolic Panel (BMET)     Other   Bipolar 2 disorder (HCC)    Follows with psychiatry-taking Tegretol 200 mg twice daily and Seroquel 400 mg daily.  Continue collaboration and per psychiatry's recommendations.      Hypercholesterolemia    Chronic, stable.  Will check lipids today and adjust medication as needed.  Continue atorvastatin 10 mg daily for now.      Relevant Medications   tadalafil (CIALIS) 20 MG tablet   atorvastatin (LIPITOR) 10 MG tablet   benazepril (LOTENSIN) 40 MG tablet   furosemide (LASIX) 80 MG tablet   Other Relevant Orders   Lipid Panel w/o Chol/HDL Ratio   Erectile dysfunction    Chronic, stable on Cialis 20 mg.  Requesting refills for 6 months, refill given and printed for patient.       Other Visit Diagnoses    Need for immunization against influenza       Relevant Orders   Flu Vaccine QUAD 36+ mos IM (Completed)       Follow up plan: Return in about 3 months (around 06/02/2020) for DM, HTN follow up.

## 2020-03-02 ENCOUNTER — Other Ambulatory Visit: Payer: Self-pay

## 2020-03-02 ENCOUNTER — Encounter: Payer: Self-pay | Admitting: Nurse Practitioner

## 2020-03-02 ENCOUNTER — Ambulatory Visit (INDEPENDENT_AMBULATORY_CARE_PROVIDER_SITE_OTHER): Payer: BLUE CROSS/BLUE SHIELD | Admitting: Nurse Practitioner

## 2020-03-02 VITALS — BP 153/85 | HR 71 | Temp 98.4°F | Ht 70.0 in | Wt 234.0 lb

## 2020-03-02 DIAGNOSIS — I10 Essential (primary) hypertension: Secondary | ICD-10-CM

## 2020-03-02 DIAGNOSIS — D692 Other nonthrombocytopenic purpura: Secondary | ICD-10-CM

## 2020-03-02 DIAGNOSIS — E7800 Pure hypercholesterolemia, unspecified: Secondary | ICD-10-CM

## 2020-03-02 DIAGNOSIS — E1142 Type 2 diabetes mellitus with diabetic polyneuropathy: Secondary | ICD-10-CM | POA: Diagnosis not present

## 2020-03-02 DIAGNOSIS — Z23 Encounter for immunization: Secondary | ICD-10-CM

## 2020-03-02 DIAGNOSIS — F3181 Bipolar II disorder: Secondary | ICD-10-CM

## 2020-03-02 DIAGNOSIS — N529 Male erectile dysfunction, unspecified: Secondary | ICD-10-CM

## 2020-03-02 MED ORDER — TADALAFIL 20 MG PO TABS: 20.0000 mg | ORAL_TABLET | Freq: Every day | ORAL | 0 refills | Status: DC | PRN

## 2020-03-02 MED ORDER — ATORVASTATIN CALCIUM 10 MG PO TABS: 10.0000 mg | ORAL_TABLET | Freq: Every day | ORAL | 1 refills | Status: DC

## 2020-03-02 MED ORDER — POTASSIUM CHLORIDE ER 10 MEQ PO TBCR: 10.0000 meq | EXTENDED_RELEASE_TABLET | Freq: Every day | ORAL | 1 refills | Status: DC

## 2020-03-02 MED ORDER — METFORMIN HCL 500 MG PO TABS: 500.0000 mg | ORAL_TABLET | Freq: Two times a day (BID) | ORAL | 1 refills | Status: DC

## 2020-03-02 MED ORDER — GABAPENTIN 600 MG PO TABS: 600.0000 mg | ORAL_TABLET | Freq: Two times a day (BID) | ORAL | 1 refills | Status: DC

## 2020-03-02 MED ORDER — EMPAGLIFLOZIN 25 MG PO TABS: 25.0000 mg | ORAL_TABLET | Freq: Every day | ORAL | 1 refills | Status: DC

## 2020-03-02 MED ORDER — BENAZEPRIL HCL 40 MG PO TABS: 40.0000 mg | ORAL_TABLET | Freq: Every day | ORAL | 1 refills | Status: DC

## 2020-03-02 MED ORDER — FUROSEMIDE 80 MG PO TABS: 80.0000 mg | ORAL_TABLET | Freq: Every day | ORAL | 1 refills | Status: DC

## 2020-03-02 NOTE — Assessment & Plan Note (Addendum)
Chronic, uncontrolled today in office.  Patient did not take medication yet today.  Encourage patient to obtain a blood pressure cuff at home and check blood pressure at home.  Continue benazepril 40 mg daily and furosemide 80 mg daily for now.  Goal blood pressure less than 130/80.  Return to clinic if blood pressure greater than 140/90 consistently at home.  BMP checked today.  Follow-up in 3 months

## 2020-03-02 NOTE — Assessment & Plan Note (Signed)
Follows with psychiatry-taking Tegretol 200 mg twice daily and Seroquel 400 mg daily.  Continue collaboration and per psychiatry's recommendations.

## 2020-03-02 NOTE — Patient Instructions (Signed)
Carbohydrate Counting for Diabetes Mellitus, Adult  Carbohydrate counting is a method of keeping track of how many carbohydrates you eat. Eating carbohydrates naturally increases the amount of sugar (glucose) in the blood. Counting how many carbohydrates you eat helps keep your blood glucose within normal limits, which helps you manage your diabetes (diabetes mellitus). It is important to know how many carbohydrates you can safely have in each meal. This is different for every person. A diet and nutrition specialist (registered dietitian) can help you make a meal plan and calculate how many carbohydrates you should have at each meal and snack. Carbohydrates are found in the following foods:  Grains, such as breads and cereals.  Dried beans and soy products.  Starchy vegetables, such as potatoes, peas, and corn.  Fruit and fruit juices.  Milk and yogurt.  Sweets and snack foods, such as cake, cookies, candy, chips, and soft drinks. How do I count carbohydrates? There are two ways to count carbohydrates in food. You can use either of the methods or a combination of both. Reading "Nutrition Facts" on packaged food The "Nutrition Facts" list is included on the labels of almost all packaged foods and beverages in the [place] It includes:  The serving size.  Information about nutrients in each serving, including the grams (g) of carbohydrate per serving. To use the "Nutrition Facts":  Decide how many servings you will have.  Multiply the number of servings by the number of carbohydrates per serving.  The resulting number is the total amount of carbohydrates that you will be having. Learning standard serving sizes of other foods When you eat carbohydrate foods that are not packaged or do not include "Nutrition Facts" on the label, you need to measure the servings in order to count the amount of carbohydrates:  Measure the foods that you will eat with a food scale or measuring cup, if  needed.  Decide how many standard-size servings you will eat.  Multiply the number of servings by 15. Most carbohydrate-rich foods have about 15 g of carbohydrates per serving. ? For example, if you eat 8 oz (170 g) of strawberries, you will have eaten 2 servings and 30 g of carbohydrates (2 servings x 15 g = 30 g).  For foods that have more than one food mixed, such as soups and casseroles, you must count the carbohydrates in each food that is included. The following list contains standard serving sizes of common carbohydrate-rich foods. Each of these servings has about 15 g of carbohydrates:   hamburger bun or  English muffin.   oz (15 mL) syrup.   oz (14 g) jelly.  1 slice of bread.  1 six-inch tortilla.  3 oz (85 g) cooked rice or pasta.  4 oz (113 g) cooked dried beans.  4 oz (113 g) starchy vegetable, such as peas, corn, or potatoes.  4 oz (113 g) hot cereal.  4 oz (113 g) mashed potatoes or  of a large baked potato.  4 oz (113 g) canned or frozen fruit.  4 oz (120 mL) fruit juice.  4-6 crackers.  6 chicken nuggets.  6 oz (170 g) unsweetened dry cereal.  6 oz (170 g) plain fat-free yogurt or yogurt sweetened with artificial sweeteners.  8 oz (240 mL) milk.  8 oz (170 g) fresh fruit or one small piece of fruit.  24 oz (680 g) popped popcorn. Example of carbohydrate counting Sample meal  3 oz (85 g) chicken breast.  6 oz (170 g)   brown rice.  4 oz (113 g) corn.  8 oz (240 mL) milk.  8 oz (170 g) strawberries with sugar-free whipped topping. Carbohydrate calculation 1. Identify the foods that contain carbohydrates: ? Rice. ? Corn. ? Milk. ? Strawberries. 2. Calculate how many servings you have of each food: ? 2 servings rice. ? 1 serving corn. ? 1 serving milk. ? 1 serving strawberries. 3. Multiply each number of servings by 15 g: ? 2 servings rice x 15 g = 30 g. ? 1 serving corn x 15 g = 15 g. ? 1 serving milk x 15 g = 15 g. ? 1  serving strawberries x 15 g = 15 g. 4. Add together all of the amounts to find the total grams of carbohydrates eaten: ? 30 g + 15 g + 15 g + 15 g = 75 g of carbohydrates total. Summary  Carbohydrate counting is a method of keeping track of how many carbohydrates you eat.  Eating carbohydrates naturally increases the amount of sugar (glucose) in the blood.  Counting how many carbohydrates you eat helps keep your blood glucose within normal limits, which helps you manage your diabetes.  A diet and nutrition specialist (registered dietitian) can help you make a meal plan and calculate how many carbohydrates you should have at each meal and snack. This information is not intended to replace advice given to you by your health care provider. Make sure you discuss any questions you have with your health care provider. Document Revised: 12/07/2016 Document Reviewed: 10/27/2015 Elsevier Patient Education  2020 [employer]  

## 2020-03-02 NOTE — Assessment & Plan Note (Signed)
Chronic, stable-reassurance provided.

## 2020-03-02 NOTE — Assessment & Plan Note (Signed)
Chronic, stable on Cialis 20 mg.  Requesting refills for 6 months, refill given and printed for patient.

## 2020-03-02 NOTE — Assessment & Plan Note (Addendum)
Chronic, stable.  Reports good home blood sugar readings-we will check A1c today.  Continue Metformin 500 mg twice daily and Jardiance 25 mg daily for now.  Refills given.  Gabapentin seems to be controlling neuropathy well-we will continue this, refills given.  Recommended starting baby aspirin daily for cardiovascular protection.  Follow-up in 3 months.

## 2020-03-02 NOTE — Assessment & Plan Note (Signed)
Chronic, stable.  Will check lipids today and adjust medication as needed.  Continue atorvastatin 10 mg daily for now.

## 2020-03-03 ENCOUNTER — Telehealth: Payer: Self-pay

## 2020-03-03 DIAGNOSIS — E1142 Type 2 diabetes mellitus with diabetic polyneuropathy: Secondary | ICD-10-CM

## 2020-03-03 LAB — BASIC METABOLIC PANEL
BUN/Creatinine Ratio: 20 (ref 10–24)
BUN: 14 mg/dL (ref 8–27)
CO2: 22 mmol/L (ref 20–29)
Calcium: 9.3 mg/dL (ref 8.6–10.2)
Chloride: 102 mmol/L (ref 96–106)
Creatinine, Ser: 0.7 mg/dL — ABNORMAL LOW (ref 0.76–1.27)
GFR calc Af Amer: 119 mL/min/{1.73_m2} (ref >59)
GFR calc non Af Amer: 103 mL/min/{1.73_m2} (ref >59)
Glucose: 131 mg/dL — ABNORMAL HIGH (ref 65–99)
Potassium: 4.9 mmol/L (ref 3.5–5.2)
Sodium: 136 mmol/L (ref 134–144)

## 2020-03-03 LAB — LIPID PANEL W/O CHOL/HDL RATIO
Cholesterol, Total: 155 mg/dL (ref 100–199)
HDL: 44 mg/dL (ref >39)
LDL Chol Calc (NIH): 93 mg/dL (ref 0–99)
Triglycerides: 95 mg/dL (ref 0–149)
VLDL Cholesterol Cal: 18 mg/dL (ref 5–40)

## 2020-03-03 LAB — HEMOGLOBIN A1C
Est. average glucose Bld gHb Est-mCnc: 154 mg/dL
Hgb A1c MFr Bld: 7 % — ABNORMAL HIGH (ref 4.8–5.6)

## 2020-03-03 NOTE — Telephone Encounter (Signed)
Pt called in stating that he is starting a new job soon and he was informed that [facility] on [address] suggest that he needs to be referred to a neurologists due to him being on gabapentin. Pt would like referral to be placed asap so that he can get in soon. Please advise.

## 2020-03-03 NOTE — Telephone Encounter (Signed)
Called pt advised:referral has been placed

## 2020-03-04 ENCOUNTER — Other Ambulatory Visit: Payer: Self-pay | Admitting: Unknown Physician Specialty

## 2020-03-04 ENCOUNTER — Telehealth: Payer: Self-pay

## 2020-03-04 NOTE — Telephone Encounter (Signed)
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

## 2020-03-11 DIAGNOSIS — G629 Polyneuropathy, unspecified: Secondary | ICD-10-CM | POA: Insufficient documentation

## 2020-03-12 NOTE — Telephone Encounter (Signed)
Pt  understood and verbalized understanding,

## 2020-06-04 ENCOUNTER — Ambulatory Visit: Payer: Self-pay | Admitting: *Deleted

## 2020-06-04 NOTE — Telephone Encounter (Signed)
Noted, agree with need for UC or ER visit today and try to schedule for visit with [place] next week.  Thanks

## 2020-06-04 NOTE — Telephone Encounter (Signed)
Patient calling with complaints of dizziness. Patient states symptoms feel like his blood sugar is dropping. Patient states that he ate a nab which usually improves symptoms of low blood sugar but he feels the same and thinks it may be his BP. Patient states that he feels like his " legs are going to go out". Patient states that the has not checked his blood sugar since yesterday. Patient states that he is also on mediations for BP and has taken the medications today. Patient is currently at work. Patient unable to check blood sugar or BP. No appt available in the office today. Patient advised to call 911 to transport him to the ED but patient refuses at this time because he states that it is too expensive. Patient states that there is no one that can take him to seek treatment and he will drive hisself. Patient states that he will seek treatment at the nearest Urgent Care. Reason for Disposition . SEVERE dizziness (e.g., unable to stand, requires support to walk, feels like passing out now)  Answer Assessment - Initial Assessment Questions 1. DESCRIPTION: "Describe your dizziness."     Patient states that dizziness comes and goes 2. LIGHTHEADED: "Do you feel lightheaded?" (e.g., somewhat faint, woozy, weak upon standing)     yes 3. VERTIGO: "Do you feel like either you or the room is spinning or tilting?" (i.e. vertigo)     No 4. SEVERITY: "How bad is it?"  "Do you feel like you are going to faint?" "Can you stand and walk?"   - MILD: Feels slightly dizzy, but walking normally.   - MODERATE: Feels very unsteady when walking, but not falling; interferes with normal activities (e.g., school, work) .   - SEVERE: Unable to walk without falling, or requires assistance to walk without falling; feels like passing out now.      Patient states if feels like his "legs are going to go out" 5. ONSET:  "When did the dizziness begin?"     today 6. AGGRAVATING FACTORS: "Does anything make it worse?" (e.g.,  standing, change in head position)     Standing up 7. HEART RATE: "Can you tell me your heart rate?" "How many beats in 15 seconds?"  (Note: not all patients can do this)       Not able to assess at this time 8. CAUSE: "What do you think is causing the dizziness?"     Thought it was his blood sugar but after eating a nab he still feels the same 9. RECURRENT SYMPTOM: "Have you had dizziness before?" If Yes, ask: "When was the last time?" "What happened that time?"     Yes with low blood sugar 10. OTHER SYMPTOMS: "Do you have any other symptoms?" (e.g., fever, chest pain, vomiting, diarrhea, bleeding)       no 11. PREGNANCY: "Is there any chance you are pregnant?" "When was your last menstrual period?"       n/a  Protocols used: DIZZINESS [facility]

## 2020-06-04 NOTE — Telephone Encounter (Signed)
Called pt to relay Jolene's message, no answer, left vm °

## 2020-06-07 ENCOUNTER — Ambulatory Visit: Payer: BLUE CROSS/BLUE SHIELD | Admitting: Nurse Practitioner

## 2020-06-08 ENCOUNTER — Ambulatory Visit (INDEPENDENT_AMBULATORY_CARE_PROVIDER_SITE_OTHER): Payer: BLUE CROSS/BLUE SHIELD | Admitting: Family Medicine

## 2020-06-08 ENCOUNTER — Encounter: Payer: Self-pay | Admitting: Family Medicine

## 2020-06-08 ENCOUNTER — Other Ambulatory Visit: Payer: Self-pay

## 2020-06-08 VITALS — BP 105/70 | HR 90 | Temp 98.6°F | Ht 71.26 in | Wt 229.0 lb

## 2020-06-08 DIAGNOSIS — R42 Dizziness and giddiness: Secondary | ICD-10-CM

## 2020-06-08 NOTE — Patient Instructions (Addendum)
It was great to see you!  Our plans for today:  - We are decreasing your lasix dose. Take half of your current pill (40mg ). - Come back in 1 month for follow up.    Take care and seek immediate care sooner if you develop any concerns.   Dr. 

## 2020-06-08 NOTE — Progress Notes (Signed)
    SUBJECTIVE:   CHIEF COMPLAINT / HPI:   Patient Active Problem List   Diagnosis Date Noted  . Dizziness 06/08/2020  . Neuropathy 03/11/2020  . Erectile dysfunction 03/02/2020  . Metatarsal bone fracture 08/13/2019  . Purpura senilis (HCC) 05/14/2018  . Fracture of great toe 02/15/2018  . Hypercholesterolemia 03/07/2017  . Weight gain following gastric bypass surgery 08/16/2015  . DM type 2 with diabetic peripheral neuropathy (HCC) 01/27/2015  . Hypertension 01/27/2015  . Hypogonadism in male 01/27/2015  . Bipolar 2 disorder (HCC) 01/27/2015  . Peripheral edema 01/27/2015  . Low back pain 03/18/2013  . Lumbar facet arthropathy 03/18/2013   DIZZINESS Duration: days, started Friday. 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  Description of symptoms: off kilter Duration of episode: minutes Dizziness frequency: 15 years ago had similar sx Provoking factors: movement Aggravating factors:  movement Triggered by rolling over in bed: no Triggered by bending over: sometimes Aggravated by head movement: yes Aggravated by exertion, coughing, loud noises: no Recent head injury: no Recent or current viral symptoms: no History of vasovagal episodes: no Nausea: no Vomiting: no Tinnitus: no Hearing loss: no Aural fullness: no Headache: no Photophobia/phonophobia: no Unsteady gait: no Diplopia, dysarthria, dysphagia or weakness: no Related to exertion: no Diaphoresis: no Dyspnea: no Chest pain: no   CBGs 110-115 at home.   OBJECTIVE:   BP 105/70   Pulse 90   Temp 98.6 F (37 C) (Oral)   Ht 5' 11.26" (1.81 m)   Wt 229 lb (103.9 kg)   SpO2 98%   BMI 31.71 kg/m   Gen: well appearing, in NAD Card: RRR, no murmur Lungs: CTAB MSK: Full ROM, strength 5/5 to U/LE bilaterally, normal gait.  No edema.  Neuro: Alert and oriented, speech normal.  Romberg negative.  Optic field normal. PERRL, Extraocular movements intact.  Intact symmetric sensation to light touch of face and  extremities bilaterally.  Hearing grossly intact bilaterally.  Tongue protrudes normally with no deviation.  Shoulder shrug, smile symmetric. Finger to nose normal. Negative Dix Hallpike.   ASSESSMENT/PLAN:   Dizziness Likely 2/2 transient hypotension. Orthostatics negative but with low BP in office today with orthostatic sx. CBGs wnl. Neuro exam wnl with negative Gilberto Better. No sx to concern for new stroke or cardiac etiology. Will decrease lasix to 40mg . F/u in 1 month.    , DO

## 2020-06-08 NOTE — Assessment & Plan Note (Signed)
Likely 2/2 transient hypotension. Orthostatics negative but with low BP in office today with orthostatic sx. CBGs wnl. Neuro exam wnl with negative Gilberto Better. No sx to concern for new stroke or cardiac etiology. Will decrease lasix to 40mg . F/u in 1 month.

## 2020-06-11 ENCOUNTER — Telehealth: Payer: BLUE CROSS/BLUE SHIELD | Admitting: Family

## 2020-06-11 DIAGNOSIS — R42 Dizziness and giddiness: Secondary | ICD-10-CM

## 2020-06-11 MED ORDER — MECLIZINE HCL 25 MG PO TABS: 25.0000 mg | ORAL_TABLET | Freq: Three times a day (TID) | ORAL | 0 refills | Status: DC | PRN

## 2020-06-11 NOTE — Progress Notes (Signed)
E Visit for Motion Sickness  We are sorry that you are not feeling well. Here is how we plan to help!  Based on what you have shared with me it looks like you have symptoms of motion sickness.  I have prescribed a medication that will help prevent or alleviate your symptoms:  Meclizine 25mg by mouth three times per day as needed for nausea/motion sickness   Prevention:  You might feel better if you keep your eyes focused on outside while you are in motion. For example, if you are in a car, sit in the front and look in the direction you are moving; if you are on a boat, stay on the deck and look to the horizon. This helps make what you see match the movement you are feeling, and so you are less likely to feel sick.  You should also avoid reading, watching a movie, texting or reading messages, or looking at things close to you inside the vehicle you are riding in.  . Use the seat head rest. Lean your head against the back of the seat or head rest when traveling in vehicles with seats to minimize head movements.  . On a ship: When making your reservations, choose a cabin in the middle of the ship and near the waterline. When on board, go up on deck and focus on the horizon.  . In an airplane: Request a window seat and look out the window. A seat over the front edge of the wing is the most preferable spot (the degree of motion is the lowest here). Direct the air vent to blow cool air on your face.  . On a train: Always face forward and sit near a window.  . In a vehicle: Sit in the front seat; if you are the passenger, look at the scenery in the distance. For some people, driving the vehicle (rather than being a passenger) is an instant remedy.  . Avoid others who have become nauseous with motion sickness. Seeing and smelling others who have motion sickness may cause you to become sick.  GET HELP RIGHT AWAY IF:   Your symptoms do not improve or worsen within 2 days after  treatment.   You cannot keep down fluids after trying the medication.   Other associated symptoms such as severe headache, visual field changes, fever, or intractable nausea and vomiting.  MAKE SURE YOU:   Understand these instructions.  Will watch your condition.  Will get help right away if you are not doing well or get worse.  Thank you for choosing an e-visit.  Your e-visit answers were reviewed by a board certified advanced clinical practitioner to complete your personal care plan. Depending upon the condition, your plan could have included both over the counter or prescription medications.  Please review your pharmacy choice. Be sure that the pharmacy you have chosen is open so that you can pick up your prescription now.  If there is a problem you may message your provider in MyChart to have the prescription routed to another pharmacy.  Your safety is important to us. If you have drug allergies check your prescription carefully.   For the next 24 hours, you can use MyChart to ask questions about today's visit, request a non-urgent call back, or ask for a work or school excuse from your e-visit provider.  You will get an e-mail in the next two days asking about your experience. I hope that your e-visit has been valuable and will speed   your recovery.   References or for more information: https://wwwnc.cdc.gov/travel/yellowbook/2020/travel-by-air-land-sea/motion-sickness https://my.[facility].org/health/articles/12782-motion-sickness https://www.uptodate.com  Greater than 5 minutes, yet less than 10 minutes of time have been spent researching, coordinating, and implementing care for this patient today.  Thank you for the details you included in the comment boxes. Those details are very helpful in determining the best course of treatment for you and help us to provide the best care.  

## 2020-06-16 ENCOUNTER — Ambulatory Visit (INDEPENDENT_AMBULATORY_CARE_PROVIDER_SITE_OTHER): Payer: BLUE CROSS/BLUE SHIELD | Admitting: Family Medicine

## 2020-06-16 ENCOUNTER — Encounter: Payer: Self-pay | Admitting: Family Medicine

## 2020-06-16 ENCOUNTER — Other Ambulatory Visit: Payer: Self-pay

## 2020-06-16 VITALS — BP 129/77 | HR 96 | Temp 98.1°F | Wt 229.1 lb

## 2020-06-16 DIAGNOSIS — R42 Dizziness and giddiness: Secondary | ICD-10-CM

## 2020-06-16 NOTE — Assessment & Plan Note (Signed)
Symptoms persistent despite improvement in BP. Symptoms consistent with vertigo, triggered by driving cart at work. Has not been at work since taking meclizine, unclear if helping symptoms. Will refer to vestibular PT for definitive tx with Epley. May continue to take meclizine prn. Note provided for work. F/u prn.

## 2020-06-16 NOTE — Progress Notes (Signed)
   SUBJECTIVE:   CHIEF COMPLAINT / HPI:   Patient Active Problem List   Diagnosis Date Noted  . Dizziness 06/08/2020  . Neuropathy 03/11/2020  . Erectile dysfunction 03/02/2020  . Metatarsal bone fracture 08/13/2019  . Purpura senilis (HCC) 05/14/2018  . Fracture of great toe 02/15/2018  . Hypercholesterolemia 03/07/2017  . Weight gain following gastric bypass surgery 08/16/2015  . DM type 2 with diabetic peripheral neuropathy (HCC) 01/27/2015  . Hypertension 01/27/2015  . Hypogonadism in male 01/27/2015  . Bipolar 2 disorder (HCC) 01/27/2015  . Peripheral edema 01/27/2015  . Low back pain 03/18/2013  . Lumbar facet arthropathy 03/18/2013   DIZZINESS - seen previously 1/11 for same, low BP at that time, lasix decreased - seen again 1/14 virtually and Rx meclizine for vertigo.  - symptoms have largely unchanged. 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  Hasn't been at work since virtual visit and trying meclizine. - started 1/7-8.  - last took meclizine few hours ago.  -   Duration: 2 weeks Description of symptoms: off kilter Duration of episode: minutes Provoking factors: driving cart at work Triggered by rolling over in bed: no Triggered by bending over: sometimes Aggravated by head movement: yes Aggravated by exertion, coughing, loud noises: no Recent head injury: no Recent or current viral symptoms: no History of vasovagal episodes: no Nausea: no Vomiting: no Tinnitus: no Hearing loss: no Aural fullness: no Headache: no Photophobia/phonophobia: no Unsteady gait: no Postural instability: no Diplopia, dysarthria, dysphagia or weakness: no Related to exertion: no Pallor: no Diaphoresis: no Dyspnea: no Chest pain: no   OBJECTIVE:   BP 129/77   Pulse 96   Temp 98.1 F (36.7 C)   Wt 229 lb 2 oz (103.9 kg)   BMI 31.72 kg/m   Gen: well appearing, in NAD Card: RRR Lungs: CTAB Neuro: A&O  x4, Dix Hallpike negative  ASSESSMENT/PLAN:   Dizziness Symptoms persistent despite improvement in BP. Symptoms consistent with vertigo, triggered by driving cart at work. Has not been at work since taking meclizine, unclear if helping symptoms. Will refer to vestibular PT for definitive tx with Epley. May continue to take meclizine prn. Note provided for work. F/u prn.    Caro Laroche, DO

## 2020-06-16 NOTE — Patient Instructions (Signed)
It was great to see you!  Our plans for today:  - We provided a note for work.  - Someone will be calling you about an appointment for PT.   Take care and seek immediate care sooner if you develop any concerns.   Dr. Linwood Dibbles

## 2020-06-22 LAB — HM DIABETES EYE EXAM

## 2020-06-26 ENCOUNTER — Encounter: Payer: Self-pay | Admitting: Nurse Practitioner

## 2020-06-26 DIAGNOSIS — E669 Obesity, unspecified: Secondary | ICD-10-CM | POA: Insufficient documentation

## 2020-06-26 DIAGNOSIS — E785 Hyperlipidemia, unspecified: Secondary | ICD-10-CM | POA: Insufficient documentation

## 2020-06-26 DIAGNOSIS — E1169 Type 2 diabetes mellitus with other specified complication: Secondary | ICD-10-CM | POA: Insufficient documentation

## 2020-06-29 ENCOUNTER — Other Ambulatory Visit: Payer: Self-pay

## 2020-06-29 ENCOUNTER — Ambulatory Visit (INDEPENDENT_AMBULATORY_CARE_PROVIDER_SITE_OTHER): Payer: BLUE CROSS/BLUE SHIELD | Admitting: Nurse Practitioner

## 2020-06-29 ENCOUNTER — Encounter: Payer: Self-pay | Admitting: Nurse Practitioner

## 2020-06-29 VITALS — BP 98/65 | HR 89 | Temp 97.5°F | Ht 71.34 in | Wt 230.4 lb

## 2020-06-29 DIAGNOSIS — E1159 Type 2 diabetes mellitus with other circulatory complications: Secondary | ICD-10-CM | POA: Diagnosis not present

## 2020-06-29 DIAGNOSIS — E1142 Type 2 diabetes mellitus with diabetic polyneuropathy: Secondary | ICD-10-CM | POA: Diagnosis not present

## 2020-06-29 DIAGNOSIS — R42 Dizziness and giddiness: Secondary | ICD-10-CM | POA: Diagnosis not present

## 2020-06-29 DIAGNOSIS — E785 Hyperlipidemia, unspecified: Secondary | ICD-10-CM

## 2020-06-29 DIAGNOSIS — E1169 Type 2 diabetes mellitus with other specified complication: Secondary | ICD-10-CM | POA: Diagnosis not present

## 2020-06-29 DIAGNOSIS — E6609 Other obesity due to excess calories: Secondary | ICD-10-CM

## 2020-06-29 DIAGNOSIS — E538 Deficiency of other specified B group vitamins: Secondary | ICD-10-CM

## 2020-06-29 DIAGNOSIS — I152 Hypertension secondary to endocrine disorders: Secondary | ICD-10-CM

## 2020-06-29 DIAGNOSIS — Z6831 Body mass index (BMI) 31.0-31.9, adult: Secondary | ICD-10-CM

## 2020-06-29 LAB — MICROALBUMIN, URINE WAIVED
Creatinine, Urine Waived: 50 mg/dL (ref 10–300)
Microalb, Ur Waived: 10 mg/L (ref 0–19)
Microalb/Creat Ratio: <30 mg/g (ref <30)

## 2020-06-29 LAB — BAYER DCA HB A1C WAIVED: HB A1C (BAYER DCA - WAIVED): 5.6 % (ref <7.0)

## 2020-06-29 NOTE — Assessment & Plan Note (Addendum)
Chronic, stable with A1c today downward trend at 5.6%.  Continue current medication regimen and adjust as needed, if continues good control next visit could consider reduction of Metformin or Jardiance.  Continue Gabapentin for neuropathy pain and dose renally as needed.  Will check B12 level today due to long term Metformin use and risk of low levels with this.  At this time recommend he monitor BS daily and document for visits + focus on diabetic diet.  Urine ALB ordered, he will return to office.  Return in 6 months to meet new PCP.

## 2020-06-29 NOTE — Assessment & Plan Note (Signed)
Acute and improving with PT at this time -- suspect BPPV based on current benefit with Epley and PT activity.  Continue to monitor and will write return to work once he is released from PT.  Normal neuro exam today.  Continue Meclizine as needed.

## 2020-06-29 NOTE — Patient Instructions (Signed)
Diabetes Mellitus and Nutrition, Adult When you have diabetes, or diabetes mellitus, it is very important to have healthy eating habits because your blood sugar (glucose) levels are greatly affected by what you eat and drink. Eating healthy foods in the right amounts, at about the same times every day, can help you:  Control your blood glucose.  Lower your risk of heart disease.  Improve your blood pressure.  Reach or maintain a healthy weight. What can affect my meal plan? Every person with diabetes is different, and each person has different needs for a meal plan. Your health care provider may recommend that you work with a dietitian to make a meal plan that is best for you. Your meal plan may vary depending on factors such as:  The calories you need.  The medicines you take.  Your weight.  Your blood glucose, blood pressure, and cholesterol levels.  Your activity level.  Other health conditions you have, such as heart or kidney disease. How do carbohydrates affect me? Carbohydrates, also called carbs, affect your blood glucose level more than any other type of food. Eating carbs naturally raises the amount of glucose in your blood. Carb counting is a method for keeping track of how many carbs you eat. Counting carbs is important to keep your blood glucose at a healthy level, especially if you use insulin or take certain oral diabetes medicines. It is important to know how many carbs you can safely have in each meal. This is different for every person. Your dietitian can help you calculate how many carbs you should have at each meal and for each snack. How does alcohol affect me? Alcohol can cause a sudden decrease in blood glucose (hypoglycemia), especially if you use insulin or take certain oral diabetes medicines. Hypoglycemia can be a life-threatening condition. Symptoms of hypoglycemia, such as sleepiness, dizziness, and confusion, are similar to symptoms of having too much  alcohol.  Do not drink alcohol if: ? Your health care provider tells you not to drink. ? You are pregnant, may be pregnant, or are planning to become pregnant.  If you drink alcohol: ? Do not drink on an empty stomach. ? Limit how much you use to:  0-1 drink a day for women.  0-2 drinks a day for men. ? Be aware of how much alcohol is in your drink. In the [place] one drink equals one 12 oz bottle of beer (355 mL), one 5 oz glass of wine (148 mL), or one 1 oz glass of hard liquor (44 mL). ? Keep yourself hydrated with water, diet soda, or unsweetened iced tea.  Keep in mind that regular soda, juice, and other mixers may contain a lot of sugar and must be counted as carbs. What are tips for following this plan? Reading food labels  Start by checking the serving size on the "Nutrition Facts" label of packaged foods and drinks. The amount of calories, carbs, fats, and other nutrients listed on the label is based on one serving of the item. Many items contain more than one serving per package.  Check the total grams (g) of carbs in one serving. You can calculate the number of servings of carbs in one serving by dividing the total carbs by 15. For example, if a food has 30 g of total carbs per serving, it would be equal to 2 servings of carbs.  Check the number of grams (g) of saturated fats and trans fats in one serving. Choose foods that have   a low amount or none of these fats.  Check the number of milligrams (mg) of salt (sodium) in one serving. Most people should limit total sodium intake to less than 2,300 mg per day.  Always check the nutrition information of foods labeled as "low-fat" or "nonfat." These foods may be higher in added sugar or refined carbs and should be avoided.  Talk to your dietitian to identify your daily goals for nutrients listed on the label. Shopping  Avoid buying canned, pre-made, or processed foods. These foods tend to be high in fat, sodium, and added  sugar.  Shop around the outside edge of the grocery store. This is where you will most often find fresh fruits and vegetables, bulk grains, fresh meats, and fresh dairy. Cooking  Use low-heat cooking methods, such as baking, instead of high-heat cooking methods like deep frying.  Cook using healthy oils, such as olive, canola, or sunflower oil.  Avoid cooking with butter, cream, or high-fat meats. Meal planning  Eat meals and snacks regularly, preferably at the same times every day. Avoid going long periods of time without eating.  Eat foods that are high in fiber, such as fresh fruits, vegetables, beans, and whole grains. Talk with your dietitian about how many servings of carbs you can eat at each meal.  Eat 4-6 oz (112-168 g) of lean protein each day, such as lean meat, chicken, fish, eggs, or tofu. One ounce (oz) of lean protein is equal to: ? 1 oz (28 g) of meat, chicken, or fish. ? 1 egg. ?  cup (62 g) of tofu.  Eat some foods each day that contain healthy fats, such as avocado, nuts, seeds, and fish.   What foods should I eat? Fruits Berries. Apples. Oranges. Peaches. Apricots. Plums. Grapes. Mango. Papaya. Pomegranate. Kiwi. Cherries. Vegetables Lettuce. Spinach. Leafy greens, including kale, chard, collard greens, and mustard greens. Beets. Cauliflower. Cabbage. Broccoli. Carrots. Green beans. Tomatoes. Peppers. Onions. Cucumbers. Brussels sprouts. Grains Whole grains, such as whole-wheat or whole-grain bread, crackers, tortillas, cereal, and pasta. Unsweetened oatmeal. Quinoa. Brown or wild rice. Meats and other proteins Seafood. Poultry without skin. Lean cuts of poultry and beef. Tofu. Nuts. Seeds. Dairy Low-fat or fat-free dairy products such as milk, yogurt, and cheese. The items listed above may not be a complete list of foods and beverages you can eat. Contact a dietitian for more information. What foods should I avoid? Fruits Fruits canned with  syrup. Vegetables Canned vegetables. Frozen vegetables with butter or cream sauce. Grains Refined white flour and flour products such as bread, pasta, snack foods, and cereals. Avoid all processed foods. Meats and other proteins Fatty cuts of meat. Poultry with skin. Breaded or fried meats. Processed meat. Avoid saturated fats. Dairy Full-fat yogurt, cheese, or milk. Beverages Sweetened drinks, such as soda or iced tea. The items listed above may not be a complete list of foods and beverages you should avoid. Contact a dietitian for more information. Questions to ask a health care provider  Do I need to meet with a diabetes educator?  Do I need to meet with a dietitian?  What number can I call if I have questions?  When are the best times to check my blood glucose? Where to find more information:  American Diabetes Association: diabetes.org  Academy of Nutrition and Dietetics: www.eatright.org  National Institute of Diabetes and Digestive and Kidney Diseases: www.niddk.nih.gov  Association of Diabetes Care and Education Specialists: www.diabeteseducator.org Summary  It is important to have healthy eating   habits because your blood sugar (glucose) levels are greatly affected by what you eat and drink.  A healthy meal plan will help you control your blood glucose and maintain a healthy lifestyle.  Your health care provider may recommend that you work with a dietitian to make a meal plan that is best for you.  Keep in mind that carbohydrates (carbs) and alcohol have immediate effects on your blood glucose levels. It is important to count carbs and to use alcohol carefully. This information is not intended to replace advice given to you by your health care provider. Make sure you discuss any questions you have with your health care provider. Document Revised: 04/22/2019 Document Reviewed: 04/22/2019 Elsevier Patient Education  2021 [employer]  

## 2020-06-29 NOTE — Assessment & Plan Note (Signed)
BMI 31.83.  Recommended eating smaller high protein, low fat meals more frequently and exercising 30 mins a day 5 times a week with a goal of 10-15lb weight loss in the next 3 months. Patient voiced their understanding and motivation to adhere to these recommendations.

## 2020-06-29 NOTE — Assessment & Plan Note (Signed)
Chronic, ongoing.  Continue current medication regimen and adjust as needed. Lipid panel today. 

## 2020-06-29 NOTE — Progress Notes (Signed)
BP 98/65   Pulse 89   Temp (!) 97.5 F (36.4 C) (Oral)   Ht 5' 11.34" (1.812 m)   Wt 230 lb 6.4 oz (104.5 kg)   SpO2 97%   BMI 31.83 kg/m    Subjective:    Patient ID: Derek Garza, male    DOB: 10-01-1960, 60 y.o.   MRN: [id number]  HPI: Derek Garza is a 60 y.o. male  Chief Complaint  Patient presents with  . Dizziness  . Diabetes   DIABETES Continues as Metformin 500 MG BID and Jardiance 25 MG QDAY.  Taking Gabapentin 600 MG BID for neuropathy discomfort. Hypoglycemic episodes:no Polydipsia/polyuria: no Visual disturbance: no Chest pain: no Paresthesias: no Glucose Monitoring: yes  Accucheck frequency: Daily  Fasting glucose: 110 range  Post prandial:  Evening:  Before meals: Taking Insulin?: no  Long acting insulin:  Short acting insulin: Blood Pressure Monitoring: not checking Retinal Examination: Up to Date Foot Exam: Up to Date Pneumovax: Up to Date Influenza: Up to Date Aspirin: no   HYPERTENSION / HYPERLIPIDEMIA Continues on Benazepril , Lasix, and Atorvastatin. Satisfied with current treatment? yes Duration of hypertension: chronic BP monitoring frequency: not checking BP range:  BP medication side effects: no Duration of hyperlipidemia: chronic Cholesterol medication side effects: no Cholesterol supplements: none Medication compliance: good compliance Aspirin: no Recent stressors: no Recurrent headaches: no Visual changes: no Palpitations: no Dyspnea: no Chest pain: no Lower extremity edema: no Dizzy/lightheaded: improved   DIZZINESS Seen for this on 06/16/20 -- initially Lasix decrease due to low BP and then Meclizine prescribed -- last visit symptoms continued and he was ordered PT for Epley treatment.  He reports "I think we have found where it lives" -- is working with PT and finding some improvement.  They are finding his triggers and working on them.   Duration: weeks Description of symptoms: room spinning Duration of  episode: seconds Dizziness frequency: recurrent Provoking factors: position changes Aggravating factors:  position changes Triggered by rolling over in bed: no Triggered by bending over: no Aggravated by head movement: yes Aggravated by exertion, coughing, loud noises: no Recent head injury: no Recent or current viral symptoms: no History of vasovagal episodes: no Nausea: no Vomiting: no Tinnitus: no Hearing loss: no Aural fullness: no Headache: no Photophobia/phonophobia: no Unsteady gait: no Postural instability: no Diplopia, dysarthria, dysphagia or weakness: no Related to exertion: no Pallor: no Diaphoresis: no Dyspnea: no Chest pain: no  Relevant past medical, surgical, family and social history reviewed and updated as indicated. Interim medical history since our last visit reviewed. Allergies and medications reviewed and updated.  Review of Systems  Constitutional: Negative for activity change, diaphoresis, fatigue and fever.  Respiratory: Negative for cough, chest tightness, shortness of breath and wheezing.   Cardiovascular: Negative for chest pain, palpitations and leg swelling.  Gastrointestinal: Negative.   Endocrine: Negative for cold intolerance, heat intolerance, polydipsia, polyphagia and polyuria.  Neurological: Negative.   Psychiatric/Behavioral: Negative.     Per HPI unless specifically indicated above     Objective:    BP 98/65   Pulse 89   Temp (!) 97.5 F (36.4 C) (Oral)   Ht 5' 11.34" (1.812 m)   Wt 230 lb 6.4 oz (104.5 kg)   SpO2 97%   BMI 31.83 kg/m   Wt Readings from Last 3 Encounters:  06/29/20 230 lb 6.4 oz (104.5 kg)  06/16/20 229 lb 2 oz (103.9 kg)  06/08/20 229 lb (103.9 kg)    Physical  Exam Vitals and nursing note reviewed.  Constitutional:      General: He is awake. He is not in acute distress.    Appearance: He is well-developed and well-groomed. He is obese. He is not ill-appearing.  HENT:     Head: Normocephalic and  atraumatic.     Right Ear: Hearing, tympanic membrane, ear canal and external ear normal. No drainage.     Left Ear: Hearing, tympanic membrane, ear canal and external ear normal. No drainage.  Eyes:     General: Lids are normal.        Right eye: No discharge.        Left eye: No discharge.     Conjunctiva/sclera: Conjunctivae normal.     Pupils: Pupils are equal, round, and reactive to light.  Neck:     Thyroid: No thyromegaly.     Vascular: No carotid bruit.     Trachea: Trachea normal.  Cardiovascular:     Rate and Rhythm: Normal rate and regular rhythm.     Heart sounds: Normal heart sounds, S1 normal and S2 normal. No murmur heard. No gallop.   Pulmonary:     Effort: Pulmonary effort is normal. No accessory muscle usage or respiratory distress.     Breath sounds: Normal breath sounds.  Abdominal:     General: Bowel sounds are normal.     Palpations: Abdomen is soft. There is no hepatomegaly or splenomegaly.  Musculoskeletal:        General: Normal range of motion.     Cervical back: Normal range of motion and neck supple.     Right lower leg: No edema.     Left lower leg: No edema.  Lymphadenopathy:     Cervical: No cervical adenopathy.  Skin:    General: Skin is warm and dry.     Capillary Refill: Capillary refill takes less than 2 seconds.     Findings: No rash.  Neurological:     Mental Status: He is alert and oriented to person, place, and time.     Cranial Nerves: Cranial nerves are intact.     Coordination: Coordination is intact.     Gait: Gait is intact.     Deep Tendon Reflexes: Reflexes are normal and symmetric.     Reflex Scores:      Brachioradialis reflexes are 2+ on the right side and 2+ on the left side.      Patellar reflexes are 2+ on the right side and 2+ on the left side. Psychiatric:        Attention and Perception: Attention normal.        Mood and Affect: Mood normal.        Speech: Speech normal.        Behavior: Behavior normal. Behavior is  cooperative.        Thought Content: Thought content normal.    Results for orders placed or performed in visit on 06/29/20  Bayer DCA Hb A1c Waived  Result Value Ref Range   HB A1C (BAYER DCA - WAIVED) 5.6 <7.0 %  Microalbumin, Urine Waived  Result Value Ref Range   Microalb, Ur Waived 10 0 - 19 mg/L   Creatinine, Urine Waived 50 10 - 300 mg/dL   Microalb/Creat Ratio <30 <30 mg/g      Assessment & Plan:   Problem List Items Addressed This Visit      Cardiovascular and Mediastinum   Hypertension associated with diabetes (HCC)    Chronic, well-controlled --  on tighter side of control but patient refuses any medication changes today, discussed at length with him.  Recommend he monitor BP at least a few mornings a week at home and document for provider visits.  DASH diet at home.  Continue current medication regimen and adjust as needed -- recommend cutting back on Lasix or Benazepril which he refuses to do.  Labs today: BMP and TSH.  Return in 6 months.       Relevant Orders   Bayer DCA Hb A1c Waived (Completed)   Basic metabolic panel   TSH   Microalbumin, Urine Waived     Endocrine   DM type 2 with diabetic peripheral neuropathy (HCC) - Primary    Chronic, stable with A1c today downward trend at 5.6%.  Continue current medication regimen and adjust as needed, if continues good control next visit could consider reduction of Metformin or Jardiance.  Continue Gabapentin for neuropathy pain and dose renally as needed.  Will check B12 level today due to long term Metformin use and risk of low levels with this.  At this time recommend he monitor BS daily and document for visits + focus on diabetic diet.  Urine ALB ordered, he will return to office.  Return in 6 months to meet new PCP.      Relevant Orders   Bayer DCA Hb A1c Waived (Completed)   Hyperlipidemia associated with type 2 diabetes mellitus (HCC)    Chronic, ongoing.  Continue current medication regimen and adjust as needed.   Lipid panel today.       Relevant Orders   Bayer DCA Hb A1c Waived (Completed)   Lipid Panel w/o Chol/HDL Ratio     Other   Dizziness    Acute and improving with PT at this time -- suspect BPPV based on current benefit with Epley and PT activity.  Continue to monitor and will write return to work once he is released from PT.  Normal neuro exam today.  Continue Meclizine as needed.      Obesity    BMI 31.83.  Recommended eating smaller high protein, low fat meals more frequently and exercising 30 mins a day 5 times a week with a goal of 10-15lb weight loss in the next 3 months. Patient voiced their understanding and motivation to adhere to these recommendations.        Other Visit Diagnoses    B12 deficiency       Reports history of low levels and no current supplement, check today and start supplement as needed.  On long term Metformin.   Relevant Orders   Vitamin B12       Follow up plan: Return in about 5 months (around 11/22/2020) for Annual physical with Dr. Charlotta Newton.

## 2020-06-29 NOTE — Assessment & Plan Note (Signed)
Chronic, well-controlled -- on tighter side of control but patient refuses any medication changes today, discussed at length with him.  Recommend he monitor BP at least a few mornings a week at home and document for provider visits.  DASH diet at home.  Continue current medication regimen and adjust as needed -- recommend cutting back on Lasix or Benazepril which he refuses to do.  Labs today: BMP and TSH.  Return in 6 months.

## 2020-06-30 LAB — BASIC METABOLIC PANEL
BUN/Creatinine Ratio: 22 (ref 10–24)
BUN: 23 mg/dL (ref 8–27)
CO2: 23 mmol/L (ref 20–29)
Calcium: 9.3 mg/dL (ref 8.6–10.2)
Chloride: 96 mmol/L (ref 96–106)
Creatinine, Ser: 1.04 mg/dL (ref 0.76–1.27)
GFR calc Af Amer: 90 mL/min/{1.73_m2} (ref >59)
GFR calc non Af Amer: 78 mL/min/{1.73_m2} (ref >59)
Glucose: 145 mg/dL — ABNORMAL HIGH (ref 65–99)
Potassium: 4.5 mmol/L (ref 3.5–5.2)
Sodium: 135 mmol/L (ref 134–144)

## 2020-06-30 LAB — LIPID PANEL W/O CHOL/HDL RATIO
Cholesterol, Total: 180 mg/dL (ref 100–199)
HDL: 47 mg/dL (ref >39)
LDL Chol Calc (NIH): 91 mg/dL (ref 0–99)
Triglycerides: 248 mg/dL — ABNORMAL HIGH (ref 0–149)
VLDL Cholesterol Cal: 42 mg/dL — ABNORMAL HIGH (ref 5–40)

## 2020-06-30 LAB — VITAMIN B12: Vitamin B-12: 1092 pg/mL (ref 232–1245)

## 2020-06-30 LAB — TSH: TSH: 3.37 u[IU]/mL (ref 0.450–4.500)

## 2020-06-30 NOTE — Progress Notes (Signed)
Contacted via MyChart   Good afternoon Derek Garza, your labs have returned.  Kidney function remains stable, with normal creatinine and GFR.  Thyroid lab is normal, as is B12 level.  Cholesterol levels remain above goal, with LDL at 91 and triglycerides at 248.  I would recommend we increase your Atorvastatin to 20 MG daily, if this is okay with you let me know and I will send in this increased dose.  Any questions? Keep being awesome!!  Thank you for allowing me to participate in your care. Kindest regards, Woodson Macha

## 2020-07-09 ENCOUNTER — Other Ambulatory Visit: Payer: Self-pay

## 2020-07-09 ENCOUNTER — Encounter: Payer: Self-pay | Admitting: Nurse Practitioner

## 2020-07-09 ENCOUNTER — Ambulatory Visit: Payer: BLUE CROSS/BLUE SHIELD | Admitting: Nurse Practitioner

## 2020-07-09 DIAGNOSIS — R42 Dizziness and giddiness: Secondary | ICD-10-CM | POA: Diagnosis not present

## 2020-07-09 NOTE — Patient Instructions (Signed)
Benign Positional Vertigo Vertigo is the feeling that you or your surroundings are moving when they are not. Benign positional vertigo is the most common form of vertigo. This is usually a harmless condition (benign). This condition is positional. This means that symptoms are triggered by certain movements and positions. This condition can be dangerous if it occurs while you are doing something that could cause harm to you or others. This includes activities such as driving or operating machinery. What are the causes? The inner ear has fluid-filled canals that help your brain sense movement and balance. When the fluid moves, the brain receives messages about your body's position. With benign positional vertigo, crystals in the inner ear break free and disturb the inner ear area. This causes your brain to receive confusing messages about your body's position. What increases the risk? You are more likely to develop this condition if:  You are a woman.  You are 50 years of age or older.  You have recently had a head injury.  You have an inner ear disease. What are the signs or symptoms? Symptoms of this condition usually happen when you move your head or your eyes in different directions. Symptoms may start suddenly, and usually last for less than a minute. They include:  Loss of balance and falling.  Feeling like you are spinning or moving.  Feeling like your surroundings are spinning or moving.  Nausea and vomiting.  Blurred vision.  Dizziness.  Involuntary eye movement (nystagmus). Symptoms can be mild and cause only minor problems, or they can be severe and interfere with daily life. Episodes of benign positional vertigo may return (recur) over time. Symptoms may improve over time. How is this diagnosed? This condition may be diagnosed based on:  Your medical history.  Physical exam of the head, neck, and ears.  Positional tests to check for or stimulate vertigo. You may be  asked to turn your head and change positions, such as going from sitting to lying down. A health care provider will watch for symptoms of vertigo. You may be referred to a health care provider who specializes in ear, nose, and throat problems (ENT, or otolaryngologist) or a provider who specializes in disorders of the nervous system (neurologist). How is this treated? This condition may be treated in a session in which your health care provider moves your head in specific positions to help the displaced crystals in your inner ear move. Treatment for this condition may take several sessions. Surgery may be needed in severe cases, but this is rare. In some cases, benign positional vertigo may resolve on its own in 2-4 weeks.   Follow these instructions at home: Safety  Move slowly. Avoid sudden body or head movements or certain positions, as told by your health care provider.  Avoid driving until your health care provider says it is safe for you to do so.  Avoid operating heavy machinery until your health care provider says it is safe for you to do so.  Avoid doing any tasks that would be dangerous to you or others if vertigo occurs.  If you have trouble walking or keeping your balance, try using a cane for stability. If you feel dizzy or unstable, sit down right away.  Return to your normal activities as told by your health care provider. Ask your health care provider what activities are safe for you. General instructions  Take over-the-counter and prescription medicines only as told by your health care provider.  Drink enough fluid   to keep your urine pale yellow.  Keep all follow-up visits as told by your health care provider. This is important. Contact a health care provider if:  You have a fever.  Your condition gets worse or you develop new symptoms.  Your family or friends notice any behavioral changes.  You have nausea or vomiting that gets worse.  You have numbness or a  prickling and tingling sensation. Get help right away if you:  Have difficulty speaking or moving.  Are always dizzy.  Faint.  Develop severe headaches.  Have weakness in your legs or arms.  Have changes in your hearing or vision.  Develop a stiff neck.  Develop sensitivity to light. Summary  Vertigo is the feeling that you or your surroundings are moving when they are not. Benign positional vertigo is the most common form of vertigo.  This condition is caused by crystals in the inner ear that become displaced. This causes a disturbance in an area of the inner ear that helps your brain sense movement and balance.  Symptoms include loss of balance and falling, feeling that you or your surroundings are moving, nausea and vomiting, and blurred vision.  This condition can be diagnosed based on symptoms, a physical exam, and positional tests.  Follow safety instructions as told by your health care provider. You will also be told when to contact your health care provider in case of problems. This information is not intended to replace advice given to you by your health care provider. Make sure you discuss any questions you have with your health care provider. Document Revised: 04/08/2019 Document Reviewed: 10/24/2017 Elsevier Patient Education  2021 [employer] Inc.  

## 2020-07-09 NOTE — Assessment & Plan Note (Signed)
Acute and improving with PT at this time -- suspect BPPV based on current benefit with Epley and PT activity.  Continue to monitor and will write return to work once he is released from PT.  Normal neuro exam today.  Continue Meclizine as needed.  FMLA papers will be filled as needed.  Return in 3 weeks.

## 2020-07-09 NOTE — Progress Notes (Signed)
BP 107/71   Pulse 88   Temp 97.7 F (36.5 C) (Oral)   Wt 228 lb 12.8 oz (103.8 kg)   SpO2 93%   BMI 31.61 kg/m    Subjective:    Patient ID: Derek Garza, male    DOB: 03-10-1961, 60 y.o.   MRN: [id number]  HPI: Derek Garza is a 60 y.o. male  Chief Complaint  Patient presents with  . Follow-up    Pt states he needs to discuss his progress as far as him returning to work and states he is still currently having the dizziness episodes. Pt states he has been having this episodes since the 7th of February.    DIZZINESS Seen for this on 06/16/20 -- initially Lasix decrease due to low BP and then Meclizine prescribed -- last visit symptoms continued and he was ordered PT for Epley treatment.  He reports "I think we have found where it lives" -- is working with PT and finding some improvement.  They are finding his triggers and working on them he reported at visit on 06/29/20.  His initial symptoms started January 7th -- was seen in office initially for issue 06/08/20.  Does take Meclizine as needed, he is unsure if it helps.    Next Friday morning, 07/16/20 is his last physical therapy day -- feels like he wishes not to return to work until after this final assessment.  Continues to have some wobbly episodes.  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   Duration: weeks Description of symptoms: room spinning Duration of episode: seconds Dizziness frequency: recurrent Provoking factors: position changes Aggravating factors:  position changes Triggered by rolling over in bed: no Triggered by bending over: no Aggravated by head movement: yes Aggravated by exertion, coughing, loud noises: no Recent head injury: no Recent or current viral symptoms: no History of vasovagal episodes: no Nausea: no Vomiting: no Tinnitus: no Hearing loss: no Aural fullness: no Headache: no Photophobia/phonophobia: no Unsteady gait:  no Postural instability: no Diplopia, dysarthria, dysphagia or weakness: no Related to exertion: no Pallor: no Diaphoresis: no Dyspnea: no Chest pain: no  Relevant past medical, surgical, family and social history reviewed and updated as indicated. Interim medical history since our last visit reviewed. Allergies and medications reviewed and updated.  Review of Systems  Constitutional: Negative for activity change, diaphoresis, fatigue and fever.  Respiratory: Negative for cough, chest tightness, shortness of breath and wheezing.   Cardiovascular: Negative for chest pain, palpitations and leg swelling.  Gastrointestinal: Negative.   Neurological: Positive for dizziness. Negative for syncope, weakness, numbness and headaches.  Psychiatric/Behavioral: Negative.     Per HPI unless specifically indicated above     Objective:    BP 107/71   Pulse 88   Temp 97.7 F (36.5 C) (Oral)   Wt 228 lb 12.8 oz (103.8 kg)   SpO2 93%   BMI 31.61 kg/m   Wt Readings from Last 3 Encounters:  07/09/20 228 lb 12.8 oz (103.8 kg)  06/29/20 230 lb 6.4 oz (104.5 kg)  06/16/20 229 lb 2 oz (103.9 kg)    Physical Exam Vitals and nursing note reviewed.  Constitutional:      General: He is awake. He is not in acute distress.    Appearance: He is well-developed and well-groomed. He is obese. He is not ill-appearing.  HENT:     Head: Normocephalic and atraumatic.     Right  Ear: Hearing, tympanic membrane, ear canal and external ear normal. No drainage.     Left Ear: Hearing, tympanic membrane, ear canal and external ear normal. No drainage.  Eyes:     General: Lids are normal.        Right eye: No discharge.        Left eye: No discharge.     Conjunctiva/sclera: Conjunctivae normal.     Pupils: Pupils are equal, round, and reactive to light.  Neck:     Thyroid: No thyromegaly.     Vascular: No carotid bruit.     Trachea: Trachea normal.  Cardiovascular:     Rate and Rhythm: Normal rate and  regular rhythm.     Heart sounds: Normal heart sounds, S1 normal and S2 normal. No murmur heard. No gallop.   Pulmonary:     Effort: Pulmonary effort is normal. No accessory muscle usage or respiratory distress.     Breath sounds: Normal breath sounds.  Abdominal:     General: Bowel sounds are normal.     Palpations: Abdomen is soft. There is no hepatomegaly or splenomegaly.  Musculoskeletal:        General: Normal range of motion.     Cervical back: Normal range of motion and neck supple.     Right lower leg: No edema.     Left lower leg: No edema.  Lymphadenopathy:     Cervical: No cervical adenopathy.  Skin:    General: Skin is warm and dry.     Capillary Refill: Capillary refill takes less than 2 seconds.     Findings: No rash.  Neurological:     Mental Status: He is alert and oriented to person, place, and time.     Cranial Nerves: Cranial nerves are intact.     Coordination: Coordination is intact.     Gait: Gait is intact.     Deep Tendon Reflexes: Reflexes are normal and symmetric.     Reflex Scores:      Brachioradialis reflexes are 2+ on the right side and 2+ on the left side.      Patellar reflexes are 2+ on the right side and 2+ on the left side. Psychiatric:        Attention and Perception: Attention normal.        Mood and Affect: Mood normal.        Speech: Speech normal.        Behavior: Behavior normal. Behavior is cooperative.        Thought Content: Thought content normal.    Results for orders placed or performed in visit on 06/29/20  Bayer DCA Hb A1c Waived  Result Value Ref Range   HB A1C (BAYER DCA - WAIVED) 5.6 <7.0 %  Basic metabolic panel  Result Value Ref Range   Glucose 145 (H) 65 - 99 mg/dL   BUN 23 8 - 27 mg/dL   Creatinine, Ser 6.76 0.76 - 1.27 mg/dL   GFR calc non Af Amer 78 >59 mL/min/1.73   GFR calc Af Amer 90 >59 mL/min/1.73   BUN/Creatinine Ratio 22 10 - 24   Sodium 135 134 - 144 mmol/L   Potassium 4.5 3.5 - 5.2 mmol/L   Chloride  96 96 - 106 mmol/L   CO2 23 20 - 29 mmol/L   Calcium 9.3 8.6 - 10.2 mg/dL  Lipid Panel w/o Chol/HDL Ratio  Result Value Ref Range   Cholesterol, Total 180 100 - 199 mg/dL   Triglycerides 195 (  H) 0 - 149 mg/dL   HDL 47 >31 mg/dL   VLDL Cholesterol Cal 42 (H) 5 - 40 mg/dL   LDL Chol Calc (NIH) 91 0 - 99 mg/dL  TSH  Result Value Ref Range   TSH 3.370 0.450 - 4.500 uIU/mL  Vitamin B12  Result Value Ref Range   Vitamin B-12 1,092 232 - 1,245 pg/mL  Microalbumin, Urine Waived  Result Value Ref Range   Microalb, Ur Waived 10 0 - 19 mg/L   Creatinine, Urine Waived 50 10 - 300 mg/dL   Microalb/Creat Ratio <30 <30 mg/g      Assessment & Plan:   Problem List Items Addressed This Visit      Other   Dizziness    Acute and improving with PT at this time -- suspect BPPV based on current benefit with Epley and PT activity.  Continue to monitor and will write return to work once he is released from PT.  Normal neuro exam today.  Continue Meclizine as needed.  FMLA papers will be filled as needed.  Return in 3 weeks.          Follow up plan: Return in about 3 weeks (around 07/30/2020) for Dizziness follow-up -- can he return to work? -- meet Dr. Charlotta Newton.

## 2020-07-14 ENCOUNTER — Other Ambulatory Visit: Payer: Self-pay

## 2020-07-14 ENCOUNTER — Encounter: Payer: Self-pay | Admitting: Nurse Practitioner

## 2020-07-14 ENCOUNTER — Ambulatory Visit (INDEPENDENT_AMBULATORY_CARE_PROVIDER_SITE_OTHER): Payer: BLUE CROSS/BLUE SHIELD | Admitting: Nurse Practitioner

## 2020-07-14 VITALS — BP 124/76 | HR 102 | Temp 97.6°F | Ht 71.34 in | Wt 228.0 lb

## 2020-07-14 DIAGNOSIS — R42 Dizziness and giddiness: Secondary | ICD-10-CM

## 2020-07-14 DIAGNOSIS — I152 Hypertension secondary to endocrine disorders: Secondary | ICD-10-CM

## 2020-07-14 DIAGNOSIS — E1159 Type 2 diabetes mellitus with other circulatory complications: Secondary | ICD-10-CM | POA: Diagnosis not present

## 2020-07-14 MED ORDER — FUROSEMIDE 80 MG PO TABS: 40.0000 mg | ORAL_TABLET | Freq: Every day | ORAL | 1 refills | Status: DC

## 2020-07-14 NOTE — Patient Instructions (Signed)

## 2020-07-14 NOTE — Assessment & Plan Note (Signed)
Acute and ongoing with orthostatic BP changes noted at PT, but today from lying to sitting to standing all no significant changes noted, however is on lower side of normal for him  -- suspect BPPV based on current benefit with Epley and PT activity, however some orthostatic changes may be present too.  Continue to monitor and will write return to work once he is released from PT.  Normal neuro exam today.  Continue Meclizine as needed.  Recommend he cut Lasix to 40 MG daily, 1/2 a tablet -- will further adjust as needed.  FMLA papers will be filled as needed.  Return in 1 week.

## 2020-07-14 NOTE — Assessment & Plan Note (Signed)
Chronic, well-controlled -- on tighter side of control -- will reduce Lasix to 40 MG (1/2 tablet) discussed with him today.  Recommend he monitor BP at least a few mornings a week at home and document for provider visits.  DASH diet at home.  Continue current Benazepril at current dosing.  Return in 1 week.

## 2020-07-14 NOTE — Progress Notes (Signed)
BP 124/76 Comment: sitting  Pulse (!) 102   Temp 97.6 F (36.4 C) (Oral)   Ht 5' 11.34" (1.812 m)   Wt 228 lb (103.4 kg)   SpO2 98%   BMI 31.50 kg/m    Subjective:    Patient ID: Derek Garza, male    DOB: [birth year], 60 y.o.   MRN: [id number]  HPI: Derek Garza is a 60 y.o. male  Chief Complaint  Patient presents with  . Dizziness    Patient was told by PT that he did not have Vertigo and really has a heart condition.   DIZZINESS Seen for this on 06/16/20 -- initially Lasix decrease due to low BP (he decreased for brief period) and then Meclizine prescribed -- return visit symptoms continued and he was ordered PT for Epley treatment by Dr. Linwood Dibbles.  They are finding his triggers and working on them he reported at visit on 06/29/20.  His initial symptoms started January 7th -- was seen in office initially for issue 06/08/20.  Does take Meclizine as needed, he is unsure if it helps.  He reports PT thinks his vertigo is coming from BP issue -- had orthostatic BP this week at PT with SBP from 130 to 107 with standing.  Currently takes Lasix 80 MG and Benazepril 40 MG and K+.    This Friday morning, 07/16/20 is his last physical therapy day -- feels like he wishes not to return to work until after this final assessment.  Continues to have some wobbly episodes.  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   Duration: weeks Description of symptoms: room spinning Duration of episode: seconds Dizziness frequency: recurrent Provoking factors: position changes Aggravating factors:  position changes Triggered by rolling over in bed: no Triggered by bending over: no Aggravated by head movement: yes Aggravated by exertion, coughing, loud noises: no Recent head injury: no Recent or current viral symptoms: no History of vasovagal episodes: no Nausea: no Vomiting: no Tinnitus: no Hearing loss: no Aural fullness:  no Headache: no Photophobia/phonophobia: no Unsteady gait: no Postural instability: no Diplopia, dysarthria, dysphagia or weakness: no Related to exertion: no Pallor: no Diaphoresis: no Dyspnea: no Chest pain: no  Relevant past medical, surgical, family and social history reviewed and updated as indicated. Interim medical history since our last visit reviewed. Allergies and medications reviewed and updated.  Review of Systems  Constitutional: Negative for activity change, diaphoresis, fatigue and fever.  Respiratory: Negative for cough, chest tightness, shortness of breath and wheezing.   Cardiovascular: Negative for chest pain, palpitations and leg swelling.  Gastrointestinal: Negative.   Neurological: Positive for dizziness. Negative for syncope, weakness, numbness and headaches.  Psychiatric/Behavioral: Negative.     Per HPI unless specifically indicated above     Objective:    BP 124/76 Comment: sitting  Pulse (!) 102   Temp 97.6 F (36.4 C) (Oral)   Ht 5' 11.34" (1.812 m)   Wt 228 lb (103.4 kg)   SpO2 98%   BMI 31.50 kg/m   Wt Readings from Last 3 Encounters:  07/14/20 228 lb (103.4 kg)  07/09/20 228 lb 12.8 oz (103.8 kg)  06/29/20 230 lb 6.4 oz (104.5 kg)    Physical Exam Vitals and nursing note reviewed.  Constitutional:      General: He is awake. He is not in acute distress.    Appearance: He is well-developed and well-groomed. He is obese. He  is not ill-appearing.  HENT:     Head: Normocephalic and atraumatic.     Right Ear: Hearing, tympanic membrane, ear canal and external ear normal. No drainage.     Left Ear: Hearing, tympanic membrane, ear canal and external ear normal. No drainage.  Eyes:     General: Lids are normal.        Right eye: No discharge.        Left eye: No discharge.     Conjunctiva/sclera: Conjunctivae normal.     Pupils: Pupils are equal, round, and reactive to light.  Neck:     Thyroid: No thyromegaly.     Vascular: No carotid  bruit.     Trachea: Trachea normal.  Cardiovascular:     Rate and Rhythm: Normal rate and regular rhythm.     Heart sounds: Normal heart sounds, S1 normal and S2 normal. No murmur heard. No gallop.   Pulmonary:     Effort: Pulmonary effort is normal. No accessory muscle usage or respiratory distress.     Breath sounds: Normal breath sounds.  Abdominal:     General: Bowel sounds are normal.     Palpations: Abdomen is soft. There is no hepatomegaly or splenomegaly.  Musculoskeletal:        General: Normal range of motion.     Cervical back: Normal range of motion and neck supple.     Right lower leg: No edema.     Left lower leg: No edema.  Lymphadenopathy:     Cervical: No cervical adenopathy.  Skin:    General: Skin is warm and dry.     Capillary Refill: Capillary refill takes less than 2 seconds.     Findings: No rash.  Neurological:     Mental Status: He is alert and oriented to person, place, and time.     Cranial Nerves: Cranial nerves are intact.     Coordination: Coordination is intact.     Gait: Gait is intact.     Deep Tendon Reflexes: Reflexes are normal and symmetric.     Reflex Scores:      Brachioradialis reflexes are 2+ on the right side and 2+ on the left side.      Patellar reflexes are 2+ on the right side and 2+ on the left side. Psychiatric:        Attention and Perception: Attention normal.        Mood and Affect: Mood normal.        Speech: Speech normal.        Behavior: Behavior normal. Behavior is cooperative.        Thought Content: Thought content normal.   EKG in office today with NSR and normal axis + rate 90. Results for orders placed or performed in visit on 06/29/20  Bayer DCA Hb A1c Waived  Result Value Ref Range   HB A1C (BAYER DCA - WAIVED) 5.6 <7.0 %  Basic metabolic panel  Result Value Ref Range   Glucose 145 (H) 65 - 99 mg/dL   BUN 23 8 - 27 mg/dL   Creatinine, Ser 3.55 0.76 - 1.27 mg/dL   GFR calc non Af Amer 78 >59 mL/min/1.73    GFR calc Af Amer 90 >59 mL/min/1.73   BUN/Creatinine Ratio 22 10 - 24   Sodium 135 134 - 144 mmol/L   Potassium 4.5 3.5 - 5.2 mmol/L   Chloride 96 96 - 106 mmol/L   CO2 23 20 - 29 mmol/L   Calcium  9.3 8.6 - 10.2 mg/dL  Lipid Panel w/o Chol/HDL Ratio  Result Value Ref Range   Cholesterol, Total 180 100 - 199 mg/dL   Triglycerides 008 (H) 0 - 149 mg/dL   HDL 47 >67 mg/dL   VLDL Cholesterol Cal 42 (H) 5 - 40 mg/dL   LDL Chol Calc (NIH) 91 0 - 99 mg/dL  TSH  Result Value Ref Range   TSH 3.370 0.450 - 4.500 uIU/mL  Vitamin B12  Result Value Ref Range   Vitamin B-12 1,092 232 - 1,245 pg/mL  Microalbumin, Urine Waived  Result Value Ref Range   Microalb, Ur Waived 10 0 - 19 mg/L   Creatinine, Urine Waived 50 10 - 300 mg/dL   Microalb/Creat Ratio <30 <30 mg/g      Assessment & Plan:   Problem List Items Addressed This Visit      Cardiovascular and Mediastinum   Hypertension associated with diabetes (HCC) - Primary    Chronic, well-controlled -- on tighter side of control -- will reduce Lasix to 40 MG (1/2 tablet) discussed with him today.  Recommend he monitor BP at least a few mornings a week at home and document for provider visits.  DASH diet at home.  Continue current Benazepril at current dosing.  Return in 1 week.       Relevant Medications   furosemide (LASIX) 80 MG tablet     Other   Dizziness    Acute and ongoing with orthostatic BP changes noted at PT, but today from lying to sitting to standing all no significant changes noted, however is on lower side of normal for him  -- suspect BPPV based on current benefit with Epley and PT activity, however some orthostatic changes may be present too.  Continue to monitor and will write return to work once he is released from PT.  Normal neuro exam today.  Continue Meclizine as needed.  Recommend he cut Lasix to 40 MG daily, 1/2 a tablet -- will further adjust as needed.  FMLA papers will be filled as needed.  Return in 1 week.       Relevant Orders   EKG 12-Lead (Completed)       Follow up plan: Return in about 1 week (around 07/21/2020) for BP check and dizziness.

## 2020-07-22 ENCOUNTER — Ambulatory Visit: Payer: BLUE CROSS/BLUE SHIELD | Admitting: Internal Medicine

## 2020-07-22 ENCOUNTER — Other Ambulatory Visit: Payer: Self-pay

## 2020-07-22 ENCOUNTER — Ambulatory Visit (INDEPENDENT_AMBULATORY_CARE_PROVIDER_SITE_OTHER): Payer: BLUE CROSS/BLUE SHIELD | Admitting: Nurse Practitioner

## 2020-07-22 ENCOUNTER — Encounter: Payer: Self-pay | Admitting: Nurse Practitioner

## 2020-07-22 VITALS — BP 115/73 | HR 96 | Temp 98.2°F | Wt 231.4 lb

## 2020-07-22 DIAGNOSIS — E1159 Type 2 diabetes mellitus with other circulatory complications: Secondary | ICD-10-CM

## 2020-07-22 DIAGNOSIS — R42 Dizziness and giddiness: Secondary | ICD-10-CM | POA: Diagnosis not present

## 2020-07-22 DIAGNOSIS — I152 Hypertension secondary to endocrine disorders: Secondary | ICD-10-CM

## 2020-07-22 NOTE — Progress Notes (Signed)
BP 115/73   Pulse 96   Temp 98.2 F (36.8 C) (Oral)   Wt 231 lb 6.4 oz (105 kg)   SpO2 94%   BMI 31.97 kg/m    Subjective:    Patient ID: Derek Garza, male    DOB: [birth year], 60 y.o.   MRN: [id number]  HPI: Derek Garza is a 60 y.o. male  Chief Complaint  Patient presents with  . Hypertension  . Dizziness    Pt states he does not think the dizziness has gotten better. He states at night he has to sit for a while to get his bearings and his first couple of sets are wobbly. Pt states he does not think the medication is helping.   DIZZINESS Follow-up today.  Seen for this on 06/16/20 -- initially Lasix decrease due to low BP (he decreased for brief period then stopped reduction) and then Meclizine prescribed -- return visit symptoms continued and he was ordered PT for Epley treatment by Dr. Linwood Dibbles.  They are finding his triggers and working on them he reported at visit on 06/29/20.  His initial symptoms started January 7th -- was seen in office initially for issue 06/08/20.  Does take Meclizine as needed, he is unsure if it helps.  He reports PT thinks his vertigo is coming from BP issue -- had orthostatic BP this week at PT with SBP from 130 to 107 with standing.  Was taking Lasix 80 MG and Benazepril 40 MG and K+ -- we reduced Lasix dose to 40 MG on visit 07/14/20, as he had not initially reduced this. Continues to have ongoing dizziness, last PT visit was yesterday -- he feels this helped some.  He reports was not taking Lasix when he was working full time at [employer] and dizzy episodes still were present.  EKG last visit was NSR with 90 rate.  Does not have any CP, palpitations, or SOB.  Continues to have some wobbly episodes.  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   Duration: weeks Description of symptoms: room spinning Duration of episode: seconds Dizziness frequency: recurrent Provoking factors: position  changes Aggravating factors:  position changes Triggered by rolling over in bed: no Triggered by bending over: no Aggravated by head movement: yes Aggravated by exertion, coughing, loud noises: no Recent head injury: no Recent or current viral symptoms: no History of vasovagal episodes: no Nausea: no Vomiting: no Tinnitus: no Hearing loss: no Aural fullness: no Headache: no Photophobia/phonophobia: no Unsteady gait: no Postural instability: no Diplopia, dysarthria, dysphagia or weakness: no Related to exertion: no Pallor: no Diaphoresis: no Dyspnea: no Chest pain: no  Relevant past medical, surgical, family and social history reviewed and updated as indicated. Interim medical history since our last visit reviewed. Allergies and medications reviewed and updated.  Review of Systems  Constitutional: Negative for activity change, diaphoresis, fatigue and fever.  Respiratory: Negative for cough, chest tightness, shortness of breath and wheezing.   Cardiovascular: Negative for chest pain, palpitations and leg swelling.  Gastrointestinal: Negative.   Neurological: Positive for dizziness. Negative for syncope, weakness, numbness and headaches.  Psychiatric/Behavioral: Negative.     Per HPI unless specifically indicated above     Objective:    BP 115/73   Pulse 96   Temp 98.2 F (36.8 C) (Oral)   Wt 231 lb 6.4 oz (105 kg)   SpO2 94%   BMI 31.97 kg/m   Wt  Readings from Last 3 Encounters:  07/22/20 231 lb 6.4 oz (105 kg)  07/14/20 228 lb (103.4 kg)  07/09/20 228 lb 12.8 oz (103.8 kg)    Physical Exam Vitals and nursing note reviewed.  Constitutional:      General: He is awake. He is not in acute distress.    Appearance: He is well-developed and well-groomed. He is obese. He is not ill-appearing.  HENT:     Head: Normocephalic and atraumatic.     Right Ear: Hearing, tympanic membrane, ear canal and external ear normal. No drainage.     Left Ear: Hearing, tympanic  membrane, ear canal and external ear normal. No drainage.  Eyes:     General: Lids are normal.        Right eye: No discharge.        Left eye: No discharge.     Conjunctiva/sclera: Conjunctivae normal.     Pupils: Pupils are equal, round, and reactive to light.  Neck:     Thyroid: No thyromegaly.     Vascular: No carotid bruit.     Trachea: Trachea normal.  Cardiovascular:     Rate and Rhythm: Normal rate and regular rhythm.     Heart sounds: Normal heart sounds, S1 normal and S2 normal. No murmur heard. No gallop.   Pulmonary:     Effort: Pulmonary effort is normal. No accessory muscle usage or respiratory distress.     Breath sounds: Normal breath sounds.  Abdominal:     General: Bowel sounds are normal.     Palpations: Abdomen is soft. There is no hepatomegaly or splenomegaly.  Musculoskeletal:        General: Normal range of motion.     Cervical back: Normal range of motion and neck supple.     Right lower leg: No edema.     Left lower leg: No edema.  Lymphadenopathy:     Cervical: No cervical adenopathy.  Skin:    General: Skin is warm and dry.     Capillary Refill: Capillary refill takes less than 2 seconds.     Findings: No rash.  Neurological:     Mental Status: He is alert and oriented to person, place, and time.     Cranial Nerves: Cranial nerves are intact.     Motor: Tremor (resting noted to right hand) present.     Coordination: Coordination is intact.     Gait: Gait is intact.     Deep Tendon Reflexes: Reflexes are normal and symmetric.     Reflex Scores:      Brachioradialis reflexes are 2+ on the right side and 2+ on the left side.      Patellar reflexes are 2+ on the right side and 2+ on the left side.    Comments: Mild cogwheel to left upper noted with slight rigidity, none to right.    Psychiatric:        Attention and Perception: Attention normal.        Mood and Affect: Mood normal.        Speech: Speech normal.        Behavior: Behavior normal.  Behavior is cooperative.        Thought Content: Thought content normal.    Results for orders placed or performed in visit on 06/29/20  Bayer DCA Hb A1c Waived  Result Value Ref Range   HB A1C (BAYER DCA - WAIVED) 5.6 <7.0 %  Basic metabolic panel  Result Value Ref Range  Glucose 145 (H) 65 - 99 mg/dL   BUN 23 8 - 27 mg/dL   Creatinine, Ser 6.57 0.76 - 1.27 mg/dL   GFR calc non Af Amer 78 >59 mL/min/1.73   GFR calc Af Amer 90 >59 mL/min/1.73   BUN/Creatinine Ratio 22 10 - 24   Sodium 135 134 - 144 mmol/L   Potassium 4.5 3.5 - 5.2 mmol/L   Chloride 96 96 - 106 mmol/L   CO2 23 20 - 29 mmol/L   Calcium 9.3 8.6 - 10.2 mg/dL  Lipid Panel w/o Chol/HDL Ratio  Result Value Ref Range   Cholesterol, Total 180 100 - 199 mg/dL   Triglycerides 846 (H) 0 - 149 mg/dL   HDL 47 >96 mg/dL   VLDL Cholesterol Cal 42 (H) 5 - 40 mg/dL   LDL Chol Calc (NIH) 91 0 - 99 mg/dL  TSH  Result Value Ref Range   TSH 3.370 0.450 - 4.500 uIU/mL  Vitamin B12  Result Value Ref Range   Vitamin B-12 1,092 232 - 1,245 pg/mL  Microalbumin, Urine Waived  Result Value Ref Range   Microalb, Ur Waived 10 0 - 19 mg/L   Creatinine, Urine Waived 50 10 - 300 mg/dL   Microalb/Creat Ratio <30 <30 mg/g      Assessment & Plan:   Problem List Items Addressed This Visit      Cardiovascular and Mediastinum   Hypertension associated with diabetes (HCC) - Primary    Chronic, well-controlled.  Continue current medications, he refuses to reduce Lasix further as reports edema presents with lower doses.  Recommend he monitor BP at least a few mornings a week at home and document for provider visits.  DASH diet at home.  Continue current Benazepril at current dosing.  Return in 3 weeks.         Other   Dizziness    Acute and ongoing with no changes in episodes with reduction of Lasix and minimal changes with PT and BPPV exercises.  Continue to monitor and will extend FMLA periods for 3 more weeks, longer if needed.  Refer  back to PT to continue working on this, as finds some benefit.  Referral to neurology, low suspicion for this being cardiac related as EKG normal and no red flag cardiac symptoms -- suspect more ENT or neuro related.  Neuro exam today noted mild resting tremor right side and mild cogwheel left arm.  Will obtain MRI brain to assess further. Continue Meclizine as needed.  Recommend he continue all current medications.  FMLA papers will be filled as needed.  Return in 3 weeks.      Relevant Orders   MR Brain W Wo Contrast   Ambulatory referral to Neurology   Ambulatory referral to Physical Therapy       Follow up plan: Return in about 2 weeks (around 08/05/2020) for Dizziness.

## 2020-07-22 NOTE — Patient Instructions (Signed)

## 2020-07-22 NOTE — Assessment & Plan Note (Signed)
Acute and ongoing with no changes in episodes with reduction of Lasix and minimal changes with PT and BPPV exercises.  Continue to monitor and will extend FMLA periods for 3 more weeks, longer if needed.  Refer back to PT to continue working on this, as finds some benefit.  Referral to neurology, low suspicion for this being cardiac related as EKG normal and no red flag cardiac symptoms -- suspect more ENT or neuro related.  Neuro exam today noted mild resting tremor right side and mild cogwheel left arm.  Will obtain MRI brain to assess further. Continue Meclizine as needed.  Recommend he continue all current medications.  FMLA papers will be filled as needed.  Return in 3 weeks.

## 2020-07-22 NOTE — Assessment & Plan Note (Signed)
Chronic, well-controlled.  Continue current medications, he refuses to reduce Lasix further as reports edema presents with lower doses.  Recommend he monitor BP at least a few mornings a week at home and document for provider visits.  DASH diet at home.  Continue current Benazepril at current dosing.  Return in 3 weeks.

## 2020-07-26 ENCOUNTER — Telehealth: Payer: Self-pay | Admitting: *Deleted

## 2020-07-26 NOTE — Telephone Encounter (Signed)
Pt Derek Garza brought in a form for Short Term Disability for Jolene to look over. I have placed in BIN for review

## 2020-07-26 NOTE — Telephone Encounter (Signed)
Placed form in medical records folder.

## 2020-07-27 NOTE — Telephone Encounter (Signed)
Pt came by office was explained that medical records gets outsource to our medical record company pt verbalized understanding and confirmed understanding.

## 2020-07-27 NOTE — Telephone Encounter (Signed)
Pt called about the paper work he dropped off/ Pt asked for a call for the status of the paperwork and to find out what he is suppose to be doing next/ Pt stated someone advised him it could be a month for the completion of the paperwork and he stated he doesn't have that long to wait/ please advise

## 2020-07-29 ENCOUNTER — Telehealth: Payer: Self-pay | Admitting: Internal Medicine

## 2020-07-29 MED ORDER — ALPRAZOLAM 0.5 MG PO TABS: ORAL_TABLET | ORAL | 0 refills | Status: DC

## 2020-07-29 NOTE — Telephone Encounter (Signed)
Rx sent to his pharmacy

## 2020-07-29 NOTE — Telephone Encounter (Signed)
Pt would like something called in to help him relax during his MRI tomorrow which is scheduled for 9 am. Pt states Jolene ordered MRI. Pt states he can count on having anxiety tomorrow. Pt would like that called in to   [id number] - [place], [place] - [address]

## 2020-07-29 NOTE — Telephone Encounter (Signed)
Jolene is out of the office today, can you send something for this patient?

## 2020-07-29 NOTE — Telephone Encounter (Signed)
Called and left a message for patient letting him know that his medication has been sent in.

## 2020-07-30 ENCOUNTER — Other Ambulatory Visit: Payer: Self-pay

## 2020-07-30 ENCOUNTER — Ambulatory Visit
Admission: RE | Admit: 2020-07-30 | Discharge: 2020-07-30 | Disposition: A | Discharge status: Home / Self Care | Payer: BLUE CROSS/BLUE SHIELD | Source: Ambulatory Visit | Attending: Nurse Practitioner | Admitting: Nurse Practitioner

## 2020-07-30 DIAGNOSIS — R42 Dizziness and giddiness: Secondary | ICD-10-CM | POA: Insufficient documentation

## 2020-07-30 MED ORDER — GADOBUTROL 1 MMOL/ML IV SOLN
10.0000 mL | Freq: Once | INTRAVENOUS | Status: AC | PRN
  Administered 2020-07-30: 10 mL via INTRAVENOUS

## 2020-07-30 NOTE — Progress Notes (Signed)
Contacted via MyChart   Good evening Derek Garza, your MRI has returned and overall no acute findings to explain dizziness.  You appear to have some early vascular changes, which can be common finding on imaging with aging and high blood pressure.  Goal is to control blood pressure to present worsening of this over time.  No masses, lesions, or signs of stroke noted.  Continue to work with physical therapy and if ongoing may get you into neurology. Keep being awesome!!  Thank you for allowing me to participate in your care. Kindest regards, Elyza Whitt

## 2020-08-02 ENCOUNTER — Ambulatory Visit: Payer: BLUE CROSS/BLUE SHIELD | Admitting: Internal Medicine

## 2020-08-03 ENCOUNTER — Other Ambulatory Visit: Payer: Self-pay

## 2020-08-03 NOTE — Telephone Encounter (Signed)
Refill received from pharmacy for Benazepril HCL 40 mg tabs QD QTY 90  LOV 07/22/20  Up coming appointment 08/05/20

## 2020-08-04 MED ORDER — BENAZEPRIL HCL 40 MG PO TABS: 40.0000 mg | ORAL_TABLET | Freq: Every day | ORAL | 1 refills | Status: DC

## 2020-08-05 ENCOUNTER — Encounter: Payer: Self-pay | Admitting: Nurse Practitioner

## 2020-08-05 ENCOUNTER — Ambulatory Visit (INDEPENDENT_AMBULATORY_CARE_PROVIDER_SITE_OTHER): Payer: BLUE CROSS/BLUE SHIELD | Admitting: Nurse Practitioner

## 2020-08-05 ENCOUNTER — Other Ambulatory Visit: Payer: Self-pay

## 2020-08-05 VITALS — BP 114/78 | HR 98 | Temp 97.9°F | Wt 227.8 lb

## 2020-08-05 DIAGNOSIS — E1159 Type 2 diabetes mellitus with other circulatory complications: Secondary | ICD-10-CM

## 2020-08-05 DIAGNOSIS — I152 Hypertension secondary to endocrine disorders: Secondary | ICD-10-CM

## 2020-08-05 DIAGNOSIS — R42 Dizziness and giddiness: Secondary | ICD-10-CM

## 2020-08-05 NOTE — Progress Notes (Signed)
BP 114/78   Pulse 98   Temp 97.9 F (36.6 C) (Oral)   Wt 227 lb 12.8 oz (103.3 kg)   SpO2 95%   BMI 31.47 kg/m    Subjective:    Patient ID: Derek Garza, male    DOB: September 03, 1960, 60 y.o.   MRN: [id number]  HPI: Derek Garza is a 60 y.o. male  Chief Complaint  Patient presents with  . Dizziness    Patient states he is still having issues with the dizziness. Patient states he would like discuss his recent MRI imaging.   DIZZINESS Follow-up for ongoing intermittent dizziness.  Seen for this on 06/16/20 -- initially Lasix decrease due to low BP (he decreased for brief period) and then Meclizine prescribed -- return visit symptoms continued and he was ordered PT for Epley treatment by Dr. Linwood Dibbles -- he continues to work with PT twice a week.  They are finding his triggers and working on them he reported at visit on 06/29/20.  His initial symptoms started January 7th -- was seen in office initially for issue 06/08/20.  Does take Meclizine as needed, takes this and sits down which helps episode pass.  Have adjusted BP medications due to concern for orthostatic levels.  Currently takes Lasix 40 MG (was 80 MG) and Benazepril 40 MG and K+.  Have discussed at length further reduction of BP meds, but he is hesitant about this as has concerns about edema.  Recent MRI on 07/30/20 showed no acute findings, some chronic microvascular changes are present.  He is scheduled to see neurology 09/24/20.  He has a history of Covid in winter 2020 -- but dizziness did not present until months later.  Feels like he wishes not to return to work until after further neuro assessment.  Continues to have some wobbly episodes.  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   Duration: weeks Description of symptoms: room spinning Duration of episode: seconds Dizziness frequency: recurrent Provoking factors: position changes Aggravating factors:  position  changes Triggered by rolling over in bed: no Triggered by bending over: no Aggravated by head movement: yes Aggravated by exertion, coughing, loud noises: no Recent head injury: no Recent or current viral symptoms: no History of vasovagal episodes: no Nausea: no Vomiting: no Tinnitus: no Hearing loss: no Aural fullness: no Headache: no Photophobia/phonophobia: no Unsteady gait: no Postural instability: no Diplopia, dysarthria, dysphagia or weakness: no Related to exertion: no Pallor: no Diaphoresis: no Dyspnea: no Chest pain: no  Relevant past medical, surgical, family and social history reviewed and updated as indicated. Interim medical history since our last visit reviewed. Allergies and medications reviewed and updated.  Review of Systems  Constitutional: Negative for activity change, diaphoresis, fatigue and fever.  Respiratory: Negative for cough, chest tightness, shortness of breath and wheezing.   Cardiovascular: Negative for chest pain, palpitations and leg swelling.  Gastrointestinal: Negative.   Neurological: Positive for dizziness. Negative for syncope, weakness, numbness and headaches.  Psychiatric/Behavioral: Negative.     Per HPI unless specifically indicated above     Objective:    BP 114/78   Pulse 98   Temp 97.9 F (36.6 C) (Oral)   Wt 227 lb 12.8 oz (103.3 kg)   SpO2 95%   BMI 31.47 kg/m   Wt Readings from Last 3 Encounters:  08/05/20 227 lb 12.8 oz (103.3 kg)  07/22/20 231 lb 6.4 oz (105 kg)  07/14/20  228 lb (103.4 kg)    Physical Exam Vitals and nursing note reviewed.  Constitutional:      General: He is awake. He is not in acute distress.    Appearance: He is well-developed and well-groomed. He is obese. He is not ill-appearing.  HENT:     Head: Normocephalic and atraumatic.     Right Ear: Hearing, tympanic membrane, ear canal and external ear normal. No drainage.     Left Ear: Hearing, tympanic membrane, ear canal and external ear  normal. No drainage.  Eyes:     General: Lids are normal.        Right eye: No discharge.        Left eye: No discharge.     Conjunctiva/sclera: Conjunctivae normal.     Pupils: Pupils are equal, round, and reactive to light.  Neck:     Thyroid: No thyromegaly.     Vascular: No carotid bruit.     Trachea: Trachea normal.  Cardiovascular:     Rate and Rhythm: Normal rate and regular rhythm.     Heart sounds: Normal heart sounds, S1 normal and S2 normal. No murmur heard. No gallop.   Pulmonary:     Effort: Pulmonary effort is normal. No accessory muscle usage or respiratory distress.     Breath sounds: Normal breath sounds.  Abdominal:     General: Bowel sounds are normal.     Palpations: Abdomen is soft. There is no hepatomegaly or splenomegaly.  Musculoskeletal:        General: Normal range of motion.     Cervical back: Normal range of motion and neck supple.     Right lower leg: No edema.     Left lower leg: No edema.  Lymphadenopathy:     Cervical: No cervical adenopathy.  Skin:    General: Skin is warm and dry.     Capillary Refill: Capillary refill takes less than 2 seconds.     Findings: No rash.  Neurological:     Mental Status: He is alert and oriented to person, place, and time.     Cranial Nerves: Cranial nerves are intact.     Coordination: Coordination is intact.     Gait: Gait is intact.     Deep Tendon Reflexes: Reflexes are normal and symmetric.     Reflex Scores:      Brachioradialis reflexes are 2+ on the right side and 2+ on the left side.      Patellar reflexes are 2+ on the right side and 2+ on the left side. Psychiatric:        Attention and Perception: Attention normal.        Mood and Affect: Mood normal.        Speech: Speech normal.        Behavior: Behavior normal. Behavior is cooperative.        Thought Content: Thought content normal.    Results for orders placed or performed in visit on 06/29/20  Bayer DCA Hb A1c Waived  Result Value Ref  Range   HB A1C (BAYER DCA - WAIVED) 5.6 <7.0 %  Basic metabolic panel  Result Value Ref Range   Glucose 145 (H) 65 - 99 mg/dL   BUN 23 8 - 27 mg/dL   Creatinine, Ser 8.93 0.76 - 1.27 mg/dL   GFR calc non Af Amer 78 >59 mL/min/1.73   GFR calc Af Amer 90 >59 mL/min/1.73   BUN/Creatinine Ratio 22 10 - 24  Sodium 135 134 - 144 mmol/L   Potassium 4.5 3.5 - 5.2 mmol/L   Chloride 96 96 - 106 mmol/L   CO2 23 20 - 29 mmol/L   Calcium 9.3 8.6 - 10.2 mg/dL  Lipid Panel w/o Chol/HDL Ratio  Result Value Ref Range   Cholesterol, Total 180 100 - 199 mg/dL   Triglycerides 941 (H) 0 - 149 mg/dL   HDL 47 >74 mg/dL   VLDL Cholesterol Cal 42 (H) 5 - 40 mg/dL   LDL Chol Calc (NIH) 91 0 - 99 mg/dL  TSH  Result Value Ref Range   TSH 3.370 0.450 - 4.500 uIU/mL  Vitamin B12  Result Value Ref Range   Vitamin B-12 1,092 232 - 1,245 pg/mL  Microalbumin, Urine Waived  Result Value Ref Range   Microalb, Ur Waived 10 0 - 19 mg/L   Creatinine, Urine Waived 50 10 - 300 mg/dL   Microalb/Creat Ratio <30 <30 mg/g      Assessment & Plan:   Problem List Items Addressed This Visit      Cardiovascular and Mediastinum   Hypertension associated with diabetes (HCC) - Primary    Chronic, well-controlled.  Continue current medications, he refuses to reduce Lasix further as reports edema presents with lower doses.  Recommend he monitor BP at least a few mornings a week at home and document for provider visits.  DASH diet at home.  Continue current Benazepril at current dosing.  Return in May for follow-up.         Other   Dizziness    Acute and ongoing.  Continue to monitor and will extend FMLA until May after he sees neurology, longer if needed.  PT to continue working on this, as finds some benefit.  Seeing neurology 09/24/20, low suspicion for this being cardiac related as EKG normal recent visit and no red flag cardiac symptoms -- suspect more ENT or neuro related.  MRI no acute changes.  Continue Meclizine as  needed.  Recommend he continue all current medications. Return in May for follow-up.          Follow up plan: Return in about 2 months (around 10/13/2020) for T2DM, HTN/HLD, BIPOLAR, DIZZINESS.

## 2020-08-05 NOTE — Patient Instructions (Signed)

## 2020-08-05 NOTE — Assessment & Plan Note (Signed)
Chronic, well-controlled.  Continue current medications, he refuses to reduce Lasix further as reports edema presents with lower doses.  Recommend he monitor BP at least a few mornings a week at home and document for provider visits.  DASH diet at home.  Continue current Benazepril at current dosing.  Return in May for follow-up.

## 2020-08-05 NOTE — Assessment & Plan Note (Signed)
Acute and ongoing.  Continue to monitor and will extend FMLA until May after he sees neurology, longer if needed.  PT to continue working on this, as finds some benefit.  Seeing neurology 09/24/20, low suspicion for this being cardiac related as EKG normal recent visit and no red flag cardiac symptoms -- suspect more ENT or neuro related.  MRI no acute changes.  Continue Meclizine as needed.  Recommend he continue all current medications. Return in May for follow-up.

## 2020-08-17 ENCOUNTER — Other Ambulatory Visit: Payer: Self-pay

## 2020-08-17 ENCOUNTER — Ambulatory Visit: Payer: BLUE CROSS/BLUE SHIELD | Admitting: Nurse Practitioner

## 2020-08-17 ENCOUNTER — Encounter: Payer: Self-pay | Admitting: Nurse Practitioner

## 2020-08-17 VITALS — BP 97/61 | HR 103 | Temp 97.8°F | Wt 232.2 lb

## 2020-08-17 DIAGNOSIS — I152 Hypertension secondary to endocrine disorders: Secondary | ICD-10-CM

## 2020-08-17 DIAGNOSIS — R42 Dizziness and giddiness: Secondary | ICD-10-CM | POA: Diagnosis not present

## 2020-08-17 DIAGNOSIS — E1159 Type 2 diabetes mellitus with other circulatory complications: Secondary | ICD-10-CM

## 2020-08-17 NOTE — Assessment & Plan Note (Signed)
Blood pressure today in office 97/61. He states that when he checks it at home, his blood pressure is normally high. Encouraged him to check his blood pressure at home and if <100 consistently, to call the office. Will follow-up at next appointment.

## 2020-08-17 NOTE — Progress Notes (Signed)
Established Patient Office Visit  Subjective:  Patient ID: Derek Garza, male    DOB: [birth year]  Age: 60 y.o. MRN: [id number]  CC:  Chief Complaint  Patient presents with  . Dizziness    Patient states he is still out of work due to dizziness and states that it is getting worse. Patient is requesting require paperwork for his job that patient would like to dizziness.     HPI Derek Garza presents for follow-up with dizziness and needs a letter printed saying he is compliant with his plan.   Has been trying to limit activity which helps to minimize dizziness, however he still experiences dizziness daily. Still attends physical therapy to help with dizziness. He had an episode of nausea over the weekend, however no vomiting and not since then.   Past Medical History:  Diagnosis Date  . Diabetes mellitus without complication (HCC)   . Hypertension     Past Surgical History:  Procedure Laterality Date  . ANKLE SURGERY  1979  . APPENDECTOMY  2011  . HERNIA REPAIR  [id number]  . VASECTOMY      Family History  Problem Relation Age of Onset  . Breast cancer Sister     Social History   Socioeconomic History  . Marital status: Married    Spouse name: Not on file  . Number of children: Not on file  . Years of education: Not on file  . Highest education level: Not on file  Occupational History  . Not on file  Tobacco Use  . Smoking status: Never Smoker  . Smokeless tobacco: Never Used  Vaping Use  . Vaping Use: Never used  Substance and Sexual Activity  . Alcohol use: Yes    Comment: on occasion  . Drug use: No  . Sexual activity: Not on file  Other Topics Concern  . Not on file  Social History Narrative  . Not on file   Social Determinants of Health   Financial Resource Strain: Not on file  Food Insecurity: Not on file  Transportation Needs: Not on file  Physical Activity: Not on file  Stress: Not on file  Social Connections: Not on file  Intimate  Partner Violence: Not on file    Outpatient Medications Prior to Visit  Medication Sig Dispense Refill  . ALPRAZolam (XANAX) 0.5 MG tablet Take 1 tab 30 minutes prior to his MRI- may repeat immediately before MRI 2 tablet 0  . atorvastatin (LIPITOR) 10 MG tablet Take 1 tablet (10 mg total) by mouth daily. 90 tablet 1  . benazepril (LOTENSIN) 40 MG tablet Take 1 tablet (40 mg total) by mouth daily. 90 tablet 1  . carbamazepine (TEGRETOL XR) 200 MG 12 hr tablet 200 mg 2 (two) times daily.    . empagliflozin (JARDIANCE) 25 MG TABS tablet Take 1 tablet (25 mg total) by mouth daily. 90 tablet 1  . furosemide (LASIX) 80 MG tablet Take 0.5 tablets (40 mg total) by mouth daily. 45 tablet 1  . gabapentin (NEURONTIN) 600 MG tablet Take 1 tablet (600 mg total) by mouth 2 (two) times daily. 180 tablet 1  . meclizine (ANTIVERT) 25 MG tablet Take 1 tablet (25 mg total) by mouth 3 (three) times daily as needed for dizziness. 30 tablet 0  . metFORMIN (GLUCOPHAGE) 500 MG tablet Take 1 tablet (500 mg total) by mouth 2 (two) times daily with a meal. 180 tablet 1  . potassium chloride (KLOR-CON 10) 10 MEQ tablet Take 1 tablet (10  mEq total) by mouth daily. 90 tablet 1  . QUEtiapine (SEROQUEL) 400 MG tablet Take 400 mg by mouth daily.     . tadalafil (CIALIS) 20 MG tablet Take 1 tablet (20 mg total) by mouth daily as needed for erectile dysfunction. 100 tablet 0   No facility-administered medications prior to visit.    Allergies  Allergen Reactions  . Percocet [Oxycodone-Acetaminophen] Nausea Only    ROS Review of Systems  Constitutional: Positive for fatigue. Negative for fever.  HENT: Negative.   Respiratory: Negative.   Cardiovascular: Negative.   Gastrointestinal: Positive for nausea. Negative for abdominal pain.  Musculoskeletal: Negative.   Neurological: Positive for dizziness. Negative for syncope.      Objective:    Physical Exam Vitals and nursing note reviewed.  Constitutional:       General: He is not in acute distress.    Appearance: Normal appearance.  HENT:     Head: Normocephalic.     Right Ear: Tympanic membrane, ear canal and external ear normal.     Left Ear: Tympanic membrane, ear canal and external ear normal.  Eyes:     Conjunctiva/sclera: Conjunctivae normal.  Cardiovascular:     Rate and Rhythm: Normal rate and regular rhythm.     Pulses: Normal pulses.     Heart sounds: Normal heart sounds.  Pulmonary:     Effort: Pulmonary effort is normal.     Breath sounds: Normal breath sounds.  Musculoskeletal:     Cervical back: Normal range of motion.  Skin:    General: Skin is warm and dry.  Neurological:     General: No focal deficit present.     Mental Status: He is alert and oriented to person, place, and time.     Cranial Nerves: No cranial nerve deficit.     Coordination: Coordination normal.     Gait: Gait normal.  Psychiatric:        Mood and Affect: Mood normal.        Behavior: Behavior normal.        Thought Content: Thought content normal.     BP 97/61   Pulse (!) 103   Temp 97.8 F (36.6 C) (Oral)   Wt 232 lb 3.2 oz (105.3 kg)   SpO2 (!) 85%   BMI 32.08 kg/m  Wt Readings from Last 3 Encounters:  08/17/20 232 lb 3.2 oz (105.3 kg)  08/05/20 227 lb 12.8 oz (103.3 kg)  07/22/20 231 lb 6.4 oz (105 kg)     Lab Results  Component Value Date   TSH 3.370 06/29/2020   Lab Results  Component Value Date   WBC 5.4 11/18/2019   HGB 14.3 11/18/2019   HCT 43.3 11/18/2019   MCV 88 11/18/2019   PLT 313 11/18/2019   Lab Results  Component Value Date   NA 135 06/29/2020   K 4.5 06/29/2020   CO2 23 06/29/2020   GLUCOSE 145 (H) 06/29/2020   BUN 23 06/29/2020   CREATININE 1.04 06/29/2020   BILITOT <0.2 11/18/2019   ALKPHOS 117 11/18/2019   AST 23 11/18/2019   ALT 25 11/18/2019   PROT 6.7 11/18/2019   ALBUMIN 4.2 11/18/2019   CALCIUM 9.3 06/29/2020   ANIONGAP 8 06/09/2012   Lab Results  Component Value Date   CHOL 180  06/29/2020   Lab Results  Component Value Date   HDL 47 06/29/2020   Lab Results  Component Value Date   LDLCALC 91 06/29/2020   Lab Results  Component Value Date   TRIG 248 (H) 06/29/2020   Lab Results  Component Value Date   CHOLHDL 3.0 11/12/2018   Lab Results  Component Value Date   HGBA1C 5.6 06/29/2020      Assessment & Plan:   Problem List Items Addressed This Visit      Cardiovascular and Mediastinum   Hypertension associated with diabetes (HCC)    Blood pressure today in office 97/61. He states that when he checks it at home, his blood pressure is normally high. Encouraged him to check his blood pressure at home and if <100 consistently, to call the office. Will follow-up at next appointment.         Other   Dizziness - Primary    Chronic, still having dizzy episodes daily. MRI reviewed and has an appointment with Neurology at the end of April. Continue current PT and medications until receive recommendations from neurology. Note given to keep him out of work through Oct 17, 2020 which is a few days after his next appointment at this office. Call if symptoms worsen.          No orders of the defined types were placed in this encounter.   Follow-up: Return if symptoms worsen or fail to improve.    Gerre Scull, NP

## 2020-08-17 NOTE — Assessment & Plan Note (Signed)
Chronic, still having dizzy episodes daily. MRI reviewed and has an appointment with Neurology at the end of April. Continue current PT and medications until receive recommendations from neurology. Note given to keep him out of work through Oct 17, 2020 which is a few days after his next appointment at this office. Call if symptoms worsen.

## 2020-09-01 ENCOUNTER — Other Ambulatory Visit: Payer: Self-pay

## 2020-09-01 MED ORDER — POTASSIUM CHLORIDE ER 10 MEQ PO TBCR: 10.0000 meq | EXTENDED_RELEASE_TABLET | Freq: Every day | ORAL | 0 refills | Status: DC

## 2020-09-01 MED ORDER — FUROSEMIDE 40 MG PO TABS: 40.0000 mg | ORAL_TABLET | Freq: Every day | ORAL | 0 refills | Status: DC

## 2020-09-01 MED ORDER — METFORMIN HCL 500 MG PO TABS: 500.0000 mg | ORAL_TABLET | Freq: Two times a day (BID) | ORAL | 0 refills | Status: DC

## 2020-09-01 NOTE — Telephone Encounter (Signed)
Refill request for the following medicaiton,  Metformin 500 mg 1 tab BID Furosemide 80 mg 1 tab QD Potassium CL ER 10 MEQ   1 tab QD  LOV 08/17/20 No up coming appointment noted

## 2020-09-06 ENCOUNTER — Other Ambulatory Visit: Payer: Self-pay | Admitting: Nurse Practitioner

## 2020-09-06 NOTE — Telephone Encounter (Signed)
Pt has apt on 10/13/2020 did he need to be seen sooner for refill?

## 2020-09-06 NOTE — Telephone Encounter (Signed)
   Notes to clinic: 90 day supply has been requested    Requested Prescriptions  Pending Prescriptions Disp Refills   potassium chloride (KLOR-CON) 10 MEQ tablet [Pharmacy Med Name: POTASSIUM CL ER 10 MEQ TABLET] 90 tablet 1    Sig: TAKE 1 TABLET BY MOUTH EVERY DAY      Endocrinology:  Minerals - Potassium Supplementation Passed - 09/06/2020  9:15 AM      Passed - K in normal range and within 360 days    Potassium  Date Value Ref Range Status  06/29/2020 4.5 3.5 - 5.2 mmol/L Final  06/09/2012 3.7 3.5 - 5.1 mmol/L Final          Passed - Cr in normal range and within 360 days    Creatinine  Date Value Ref Range Status  06/09/2012 1.02 0.60 - 1.30 mg/dL Final   Creatinine, Ser  Date Value Ref Range Status  06/29/2020 1.04 0.76 - 1.27 mg/dL Final          Passed - Valid encounter within last 12 months    Recent Outpatient Visits           2 weeks ago Dizziness   [facility] Mc[place], Lauren A, NP   1 month ago Hypertension associated with diabetes (HCC)   [facility] Cann[place], Jolene T, NP   1 month ago Hypertension associated with diabetes (HCC)   [facility] Cann[place], Jolene T, NP   1 month ago Hypertension associated with diabetes (HCC)   [facility] Cann[place], Corrie Dandy T, NP   1 month ago Dizziness   [facility] [place], Dorie Rank, NP       Future Appointments             In 1 month Vigg, [name], MD [facility]   In 2 months Vigg, [name], MD [facility], [facility]

## 2020-09-07 ENCOUNTER — Other Ambulatory Visit: Payer: Self-pay | Admitting: Nurse Practitioner

## 2020-09-24 ENCOUNTER — Encounter: Payer: Self-pay | Admitting: Neurology

## 2020-09-24 ENCOUNTER — Ambulatory Visit: Payer: BLUE CROSS/BLUE SHIELD | Admitting: Neurology

## 2020-09-24 ENCOUNTER — Other Ambulatory Visit: Payer: Self-pay

## 2020-09-24 VITALS — BP 124/71 | HR 92 | Ht 72.0 in | Wt 237.5 lb

## 2020-09-24 DIAGNOSIS — M4302 Spondylolysis, cervical region: Secondary | ICD-10-CM | POA: Diagnosis not present

## 2020-09-24 DIAGNOSIS — R202 Paresthesia of skin: Secondary | ICD-10-CM | POA: Diagnosis not present

## 2020-09-24 DIAGNOSIS — R269 Unspecified abnormalities of gait and mobility: Secondary | ICD-10-CM

## 2020-09-24 NOTE — Progress Notes (Addendum)
Chief Complaint  Patient presents with  . New Patient (Initial Visit)    "Spells of spontaneous dizziness" Room 15.5, alone in room      ASSESSMENT AND PLAN  Derek Garza is a 60 y.o. male  Gradually worsening tendency to fall, sudden onset bilateral leg weakness,  On examination, he has wide-based, cautious gait, mild length dependent sensory changes, hyperreflexia of bilateral knee,  He has a long history of diabetes, evidence of diabetic peripheral neuropathy,  Differentiation diagnoses also include hypoperfusion of the brain, he does has multiple vascular risk factors, had a history of bariatric surgery with 100 pound weight loss,  also known history of multilevel degenerative changes, MRI of cervical spine to rule out cervical spondylitic myelopathy, EMG nerve conduction study  May consider more extensive laboratory evaluation to rule out nutritional deficiency, and ultrasound of carotid artery if above testings are nonrevealing   DIAGNOSTIC DATA (LABS, IMAGING, TESTING) - I reviewed patient records, labs, notes, testing and imaging myself where available. Laboratory evaluation March 2022: Normal vitamin B12, TSH, Lipid panel, LDL 91, triglycerides 248, BMP showed elevated glucose 145, creatinine of 1.04, A1c of six 5.6  I of the brain with without contrast on 07/30/2020: No acute abnormality, mild supratentorium small vessel disease  CT cervical spine on November 15, 2018, multilevel degenerative changes, worst at C4-7   HISTORICAL  Derek Garza, XR 60 year old male, seen in request by his primary care nurse practitioner Derek Garza and Dr. Charlotta Garza, Derek Garza, for evaluation of tendency to fall, initial evaluation was on September 24, 2020.  I reviewed and summarized the referring note.  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, he continually had recurrent similar episode of sudden overwhelming weakness from head to toes, if he does not steady himself he will fall, eventually went out on short-term disability since June 04, 2020  He uses dizziness to describe his symptoms, on further questioning, he denies vertigo, almost all episodes happen in a standing position, he had sudden onset overwhelming weakness starting from his head traveling to his whole body, if he does not hold onto something, steady himself he would fall to the ground, this episode lasted about couple minutes, he denies loss of consciousness,  At baseline, he denies significant gait change, denies neck pain, denies bowel bladder incontinence,  Reviewed emergency room record, he presented on November 16, 2018 for fall CT cervical spine reviewed with patient, severe multilevel cervical spondylosis, worst at C4 7, disc osteophyte protruding into canal, causing variable degree of canal stenosis   REVIEW OF SYSTEMS:  Full 14 system review of systems performed and notable only for as above All other review of systems were negative.  PHYSICAL EXAM:   Vitals:   09/24/20 1115  BP: 124/71  Pulse: 92  Weight: 237 lb 8 oz (107.7 kg)  Height: 6' (1.829 m)  Not recorded     Body mass index is 32.21 kg/m.  PHYSICAL EXAMNIATION:  Gen: NAD, conversant, well nourised, well groomed                     Cardiovascular: Regular  rate rhythm, no peripheral edema, warm, nontender. Eyes: Conjunctivae clear without exudates or hemorrhage Neck: Supple, no carotid bruits. Pulmonary: Clear to auscultation bilaterally   NEUROLOGICAL EXAM:  MENTAL STATUS: Speech:    Speech is normal; fluent and spontaneous with normal comprehension.  Cognition:     Orientation to time, place and person     Normal recent and remote memory     Normal Attention span and concentration     Normal Language, naming, repeating,spontaneous speech     Fund of knowledge   CRANIAL NERVES: CN II: Visual fields are full to confrontation. Pupils are round equal and briskly reactive to light. CN III, IV, VI: extraocular movement are normal. No ptosis. CN V: Facial sensation is intact to light touch CN VII: Face is symmetric with normal eye closure  CN VIII: Hearing is normal to causal conversation. CN IX, X: Phonation is normal. CN XI: Head turning and shoulder shrug are intact  MOTOR: There is no pronator drift of out-stretched arms. Muscle bulk and tone are normal. Muscle strength is normal.  REFLEXES: Reflexes are 2+ and symmetric at the biceps, triceps, 3/3 knees, and absent at ankles. Plantar responses are flexor.  SENSORY: Absent vibratory sensation at toes, length dependent decreased light touch, pinprick to mid shin level.  COORDINATION:  There is no trunk or limb dysmetria noted.  GAIT/STANCE: He can get up from seated position arm crossed, tendency to pointing toes outwards, wide-based, mildly unsteady, could stand on tiptoe, and heels, difficulty performing tandem walking  ALLERGIES: Allergies  Allergen Reactions  . Percocet [Oxycodone-Acetaminophen] Nausea Only    HOME MEDICATIONS: Current Outpatient Medications  Medication Sig Dispense Refill  . ALPRAZolam (XANAX) 0.5 MG tablet Take 1 tab 30 minutes prior to his MRI- may repeat immediately before MRI 2 tablet 0  . atorvastatin (LIPITOR) 10 MG tablet Take 1 tablet (10  mg total) by mouth daily. 90 tablet 1  . benazepril (LOTENSIN) 40 MG tablet Take 1 tablet (40 mg total) by mouth daily. 90 tablet 1  . carbamazepine (TEGRETOL XR) 200 MG 12 hr tablet 200 mg 2 (two) times daily.    . empagliflozin (JARDIANCE) 25 MG TABS tablet Take 1 tablet (25 mg total) by mouth daily. 90 tablet 1  . furosemide (LASIX) 40 MG tablet Take 1 tablet (40 mg total) by mouth daily. 30 tablet 0  . gabapentin (NEURONTIN) 600 MG tablet Take 1 tablet (600 mg total) by mouth 2 (two) times daily. 180 tablet 1  . meclizine (ANTIVERT) 25 MG tablet Take 1 tablet (25 mg total) by mouth 3 (three) times daily as needed for dizziness. 30 tablet 0  . metFORMIN (GLUCOPHAGE) 500 MG tablet Take 1 tablet (500 mg total) by mouth 2 (two) times daily with a meal. 60 tablet 0  . potassium chloride (KLOR-CON) 10 MEQ tablet TAKE 1 TABLET BY MOUTH EVERY DAY 90 tablet 1  . QUEtiapine (SEROQUEL) 400 MG tablet Take 400 mg by mouth daily.     . tadalafil (CIALIS) 20 MG tablet Take 1 tablet (20 mg total) by mouth daily as needed for erectile dysfunction. 100 tablet 0   No current facility-administered medications for this visit.    PAST MEDICAL HISTORY: Past Medical  History:  Diagnosis Date  . Diabetes mellitus without complication (HCC)   . Hypertension     PAST SURGICAL HISTORY: Past Surgical History:  Procedure Laterality Date  . ANKLE SURGERY  1979  . APPENDECTOMY  2011  . HERNIA REPAIR  [id number]  . VASECTOMY      FAMILY HISTORY: Family History  Problem Relation Age of Onset  . Breast cancer Sister     SOCIAL HISTORY: Social History   Socioeconomic History  . Marital status: Married    Spouse name: Not on file  . Number of children: Not on file  . Years of education: Not on file  . Highest education level: Not on file  Occupational History  . Occupation: on [employer]  Tobacco Use  . Smoking status: Never Smoker  . Smokeless tobacco: Never Used  Vaping Use  . Vaping Use: Never used   Substance and Sexual Activity  . Alcohol use: Yes    Comment: on occasion  . Drug use: No  . Sexual activity: Not on file  Other Topics Concern  . Not on file  Social History Narrative   Lives alone   Right Handed   Drinks 2 liter of soda daily   Social Determinants of Health   Financial Resource Strain: Not on file  Food Insecurity: Not on file  Transportation Needs: Not on file  Physical Activity: Not on file  Stress: Not on file  Social Connections: Not on file  Intimate Partner Violence: Not on file      Levert Feinstein, M.D. Ph.D.  [facility] [address] [place], [place] [address] Ph: [phone] Fax: [phone]  CC:  Marjie Skiff, NP [address] [place],  [place] [address]  Loura Pardon, MD   Addendum: Neurosurgical evaluation by Dr. 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

## 2020-09-29 ENCOUNTER — Other Ambulatory Visit: Payer: Self-pay | Admitting: Internal Medicine

## 2020-09-29 ENCOUNTER — Telehealth: Payer: Self-pay | Admitting: Neurology

## 2020-09-29 ENCOUNTER — Other Ambulatory Visit: Payer: Self-pay | Admitting: Neurology

## 2020-09-29 MED ORDER — ALPRAZOLAM 0.5 MG PO TABS: ORAL_TABLET | ORAL | 0 refills | Status: DC

## 2020-09-29 NOTE — Telephone Encounter (Signed)
I spoke to the patient. He has taken alprazolam 0.5mg  in the past for MRI sedation and it worked well. He understands that he will need someone to drive him to and from the appt. Rx sent to MD for approval.

## 2020-09-29 NOTE — Telephone Encounter (Signed)
no the covid questions MR Cervical spine wo contrast Dr. Mertie Clause Berkley Harvey: NPR spoke to [facility] Ref # [id number]. Patient is scheduled at [facility] for 10/05/20.  He informed me he is slightly claustrophic and would like something to help him. He is aware to have a driver.

## 2020-09-29 NOTE — Telephone Encounter (Signed)
Pt requesting ALPRAZolam (XANAX) 0.5 MG tablet for MRI appt on 10/05/20. Would like a call from the nurse to confirm have been sent.

## 2020-09-29 NOTE — Addendum Note (Signed)
Addended by: Lindell Spar C on: 09/29/2020 10:29 AM   Modules accepted: Orders

## 2020-09-29 NOTE — Telephone Encounter (Signed)
I have already spoken to this patient. He is aware we have sent the prescription request for alprazolam 0.5mg  to Dr. Terrace Arabia for approval. It will be sent to his pharmacy. We have reviewed the instructions and that he must have a driver.

## 2020-10-05 ENCOUNTER — Ambulatory Visit: Payer: BLUE CROSS/BLUE SHIELD

## 2020-10-05 ENCOUNTER — Other Ambulatory Visit: Payer: Self-pay

## 2020-10-05 DIAGNOSIS — R202 Paresthesia of skin: Secondary | ICD-10-CM | POA: Diagnosis not present

## 2020-10-05 DIAGNOSIS — R269 Unspecified abnormalities of gait and mobility: Secondary | ICD-10-CM | POA: Diagnosis not present

## 2020-10-05 DIAGNOSIS — M4302 Spondylolysis, cervical region: Secondary | ICD-10-CM

## 2020-10-07 ENCOUNTER — Telehealth: Payer: Self-pay | Admitting: Neurology

## 2020-10-07 DIAGNOSIS — Z981 Arthrodesis status: Secondary | ICD-10-CM | POA: Insufficient documentation

## 2020-10-07 DIAGNOSIS — G959 Disease of spinal cord, unspecified: Secondary | ICD-10-CM

## 2020-10-07 NOTE — Telephone Encounter (Signed)
  IMPRESSION:   MRI cervical spine (without) demonstrating: - At C5-6 disc bulging and facet hypertrophy with severe spinal stenosis and severe biforaminal stenosis; myelomalacia within the spinal cord. - At C4-5 disc bulging and facet hypertrophy with moderate spinal stenosis and severe biforaminal stenosis. - At C3-4 disc bulging with severe biforaminal stenosis. - At C7-T1 disc bulging and facet hypertrophy with moderate left foraminal stenosis.  I called patient abnormal MRI of cervical spine showed severe spinal stenosis at C5-6 level, moderate stenosis at C4-5  I have started referral to neurosurgeon, for potential decompression surgery, he is to keep his scheduled appointment on Oct 18, 2020

## 2020-10-07 NOTE — Telephone Encounter (Signed)
Faxed referral and marked as urgent for [facility]. Phone: [phone]. Fax: [phone].

## 2020-10-11 ENCOUNTER — Other Ambulatory Visit: Payer: Self-pay

## 2020-10-11 NOTE — Telephone Encounter (Signed)
Received notification from [facility] regarding patient. He has an appointment scheduled with Dr. Danielle Dess on 10/15/20 at 8:15.

## 2020-10-11 NOTE — Telephone Encounter (Signed)
Refill requests for Gabapentin 600mg  1 cap BID, and Atorvastatin 10 mg QD

## 2020-10-12 MED ORDER — GABAPENTIN 600 MG PO TABS: 600.0000 mg | ORAL_TABLET | Freq: Two times a day (BID) | ORAL | 1 refills | Status: DC

## 2020-10-12 MED ORDER — ATORVASTATIN CALCIUM 10 MG PO TABS: 10.0000 mg | ORAL_TABLET | Freq: Every day | ORAL | 1 refills | Status: DC

## 2020-10-13 ENCOUNTER — Ambulatory Visit: Payer: BLUE CROSS/BLUE SHIELD | Admitting: Internal Medicine

## 2020-10-13 ENCOUNTER — Encounter: Payer: Self-pay | Admitting: Internal Medicine

## 2020-10-13 ENCOUNTER — Other Ambulatory Visit: Payer: Self-pay

## 2020-10-13 VITALS — BP 122/74 | HR 105 | Temp 97.7°F | Ht 72.01 in | Wt 238.8 lb

## 2020-10-13 DIAGNOSIS — E1169 Type 2 diabetes mellitus with other specified complication: Secondary | ICD-10-CM | POA: Diagnosis not present

## 2020-10-13 DIAGNOSIS — R42 Dizziness and giddiness: Secondary | ICD-10-CM | POA: Diagnosis not present

## 2020-10-13 DIAGNOSIS — Z125 Encounter for screening for malignant neoplasm of prostate: Secondary | ICD-10-CM

## 2020-10-13 DIAGNOSIS — E785 Hyperlipidemia, unspecified: Secondary | ICD-10-CM

## 2020-10-13 DIAGNOSIS — E1142 Type 2 diabetes mellitus with diabetic polyneuropathy: Secondary | ICD-10-CM | POA: Diagnosis not present

## 2020-10-13 DIAGNOSIS — Z1329 Encounter for screening for other suspected endocrine disorder: Secondary | ICD-10-CM

## 2020-10-13 LAB — URINALYSIS, ROUTINE W REFLEX MICROSCOPIC
Bilirubin, UA: NEGATIVE
Ketones, UA: NEGATIVE
Leukocytes,UA: NEGATIVE
Nitrite, UA: NEGATIVE
Protein,UA: NEGATIVE
RBC, UA: NEGATIVE
Specific Gravity, UA: 1.01 (ref 1.005–1.030)
Urobilinogen, Ur: 0.2 mg/dL (ref 0.2–1.0)
pH, UA: 5.5 (ref 5.0–7.5)

## 2020-10-13 LAB — BAYER DCA HB A1C WAIVED: HB A1C (BAYER DCA - WAIVED): 6.7 % (ref <7.0)

## 2020-10-13 NOTE — Progress Notes (Signed)
BP 122/74   Pulse (!) 105   Temp 97.7 F (36.5 C)   Ht 6' 0.01" (1.829 m)   Wt 238 lb 12.8 oz (108.3 kg)   SpO2 98%   BMI 32.38 kg/m    Subjective:    Patient ID: Derek Garza, male    DOB: 13-Nov-1960, 60 y.o.   MRN: [id number]  HPI: Derek Garza is a 60 y.o. male  Dizziness This is a chronic problem. The problem has been waxing and waning. Pertinent negatives include no rash, visual change or vomiting.  Diabetes He presents for his follow-up diabetic visit. He has type 2 diabetes mellitus. Hypoglycemia symptoms include dizziness. Pertinent negatives for diabetes include no visual change.  Hyperlipidemia This is a chronic problem.  Hypertension This is a chronic problem. The problem is controlled.    Chief Complaint  Patient presents with  . Dizziness  . Diabetes  . Hyperlipidemia  . Hypertension  . Manic Behavior    Relevant past medical, surgical, family and social history reviewed and updated as indicated. Interim medical history since our last visit reviewed. Allergies and medications reviewed and updated.  Review of Systems  Gastrointestinal: Negative for vomiting.  Skin: Negative for rash.  Neurological: Positive for dizziness.    Per HPI unless specifically indicated above     Objective:    BP 122/74   Pulse (!) 105   Temp 97.7 F (36.5 C)   Ht 6' 0.01" (1.829 m)   Wt 238 lb 12.8 oz (108.3 kg)   SpO2 98%   BMI 32.38 kg/m   Wt Readings from Last 3 Encounters:  10/13/20 238 lb 12.8 oz (108.3 kg)  09/24/20 237 lb 8 oz (107.7 kg)  08/17/20 232 lb 3.2 oz (105.3 kg)    Physical Exam  Results for orders placed or performed in visit on 06/29/20  Bayer DCA Hb A1c Waived  Result Value Ref Range   HB A1C (BAYER DCA - WAIVED) 5.6 <7.0 %  Basic metabolic panel  Result Value Ref Range   Glucose 145 (H) 65 - 99 mg/dL   BUN 23 8 - 27 mg/dL   Creatinine, Ser 3.21 0.76 - 1.27 mg/dL   GFR calc non Af Amer 78 >59 mL/min/1.73   GFR calc Af Amer 90  >59 mL/min/1.73   BUN/Creatinine Ratio 22 10 - 24   Sodium 135 134 - 144 mmol/L   Potassium 4.5 3.5 - 5.2 mmol/L   Chloride 96 96 - 106 mmol/L   CO2 23 20 - 29 mmol/L   Calcium 9.3 8.6 - 10.2 mg/dL  Lipid Panel w/o Chol/HDL Ratio  Result Value Ref Range   Cholesterol, Total 180 100 - 199 mg/dL   Triglycerides 224 (H) 0 - 149 mg/dL   HDL 47 >82 mg/dL   VLDL Cholesterol Cal 42 (H) 5 - 40 mg/dL   LDL Chol Calc (NIH) 91 0 - 99 mg/dL  TSH  Result Value Ref Range   TSH 3.370 0.450 - 4.500 uIU/mL  Vitamin B12  Result Value Ref Range   Vitamin B-12 1,092 232 - 1,245 pg/mL  Microalbumin, Urine Waived  Result Value Ref Range   Microalb, Ur Waived 10 0 - 19 mg/L   Creatinine, Urine Waived 50 10 - 300 mg/dL   Microalb/Creat Ratio <30 <30 mg/g        Current Outpatient Medications:  .  ALPRAZolam (XANAX) 0.5 MG tablet, Take 1-2 tablets thirty minutes prior to MRI.  May take one additional tablet  before entering scanner, if needed.  MUST HAVE DRIVER., Disp: 3 tablet, Rfl: 0 .  atorvastatin (LIPITOR) 10 MG tablet, Take 1 tablet (10 mg total) by mouth daily., Disp: 90 tablet, Rfl: 1 .  benazepril (LOTENSIN) 40 MG tablet, Take 1 tablet (40 mg total) by mouth daily., Disp: 90 tablet, Rfl: 1 .  carbamazepine (TEGRETOL XR) 200 MG 12 hr tablet, 200 mg 2 (two) times daily., Disp: , Rfl:  .  empagliflozin (JARDIANCE) 25 MG TABS tablet, Take 1 tablet (25 mg total) by mouth daily., Disp: 90 tablet, Rfl: 1 .  furosemide (LASIX) 40 MG tablet, TAKE 1 TABLET BY MOUTH EVERY DAY, Disp: 30 tablet, Rfl: 0 .  gabapentin (NEURONTIN) 600 MG tablet, Take 1 tablet (600 mg total) by mouth 2 (two) times daily., Disp: 180 tablet, Rfl: 1 .  meclizine (ANTIVERT) 25 MG tablet, Take 1 tablet (25 mg total) by mouth 3 (three) times daily as needed for dizziness., Disp: 30 tablet, Rfl: 0 .  metFORMIN (GLUCOPHAGE) 500 MG tablet, Take 1 tablet (500 mg total) by mouth 2 (two) times daily with a meal., Disp: 60 tablet, Rfl: 0 .   potassium chloride (KLOR-CON) 10 MEQ tablet, TAKE 1 TABLET BY MOUTH EVERY DAY, Disp: 90 tablet, Rfl: 1 .  QUEtiapine (SEROQUEL) 400 MG tablet, Take 400 mg by mouth daily. , Disp: , Rfl:  .  tadalafil (CIALIS) 20 MG tablet, Take 1 tablet (20 mg total) by mouth daily as needed for erectile dysfunction., Disp: 100 tablet, Rfl: 0    Assessment & Plan:  1. DM : is on jardiance 25 mg, metformin 500 mg bid  check HbA1c,  urine  microalbumin  diabetic diet plan given to pt  adviced regarding hypoglycemia and instructions given to pt today on how to prevent and treat the same if it were to occur. pt acknowledges the plan and voices understanding of the same.  exercise plan given and encouraged.   advice diabetic yearly podiatry, ophthalmology , nutritionist , dental check q 6 months,   2. BPD :  is on seroquel sees psychology and Carbamezepine for such   3. Chronic dizziness is seeing neurology for such  Is out of work since January  Is on meclizine for such   check ECHO and US carotids no records of these done in the past as part of his work up  To see neurosurgery this friday To have an ? 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 Cannady, NP,  who has seen this pt in the past and put him on medical leave. Ok to extend leave x 3 weeks while we investigate cause of his dizziness   Problem List Items Addressed This Visit   None      Follow up plan: No follow-ups on file.

## 2020-10-14 LAB — BASIC METABOLIC PANEL
BUN/Creatinine Ratio: 18 (ref 10–24)
BUN: 18 mg/dL (ref 8–27)
CO2: 16 mmol/L — ABNORMAL LOW (ref 20–29)
Calcium: 9.2 mg/dL (ref 8.6–10.2)
Chloride: 100 mmol/L (ref 96–106)
Creatinine, Ser: 0.99 mg/dL (ref 0.76–1.27)
Glucose: 225 mg/dL — ABNORMAL HIGH (ref 65–99)
Potassium: 4.6 mmol/L (ref 3.5–5.2)
Sodium: 133 mmol/L — ABNORMAL LOW (ref 134–144)
eGFR: 87 mL/min/{1.73_m2} (ref >59)

## 2020-10-18 ENCOUNTER — Encounter: Payer: Self-pay | Admitting: Neurology

## 2020-10-18 ENCOUNTER — Encounter: Payer: BLUE CROSS/BLUE SHIELD | Admitting: Neurology

## 2020-10-24 ENCOUNTER — Other Ambulatory Visit: Payer: Self-pay | Admitting: Internal Medicine

## 2020-10-24 NOTE — Telephone Encounter (Signed)
Requested Prescriptions  Pending Prescriptions Disp Refills  . furosemide (LASIX) 40 MG tablet [Pharmacy Med Name: FUROSEMIDE 40 MG TABLET] 30 tablet 0    Sig: TAKE 1 TABLET BY MOUTH EVERY DAY     Cardiovascular:  Diuretics - Loop Failed - 10/24/2020 12:32 PM      Failed - Na in normal range and within 360 days    Sodium  Date Value Ref Range Status  10/13/2020 133 (L) 134 - 144 mmol/L Final  06/09/2012 142 136 - 145 mmol/L Final         Passed - K in normal range and within 360 days    Potassium  Date Value Ref Range Status  10/13/2020 4.6 3.5 - 5.2 mmol/L Final  06/09/2012 3.7 3.5 - 5.1 mmol/L Final         Passed - Ca in normal range and within 360 days    Calcium  Date Value Ref Range Status  10/13/2020 9.2 8.6 - 10.2 mg/dL Final   Calcium, Total  Date Value Ref Range Status  06/09/2012 8.3 (L) 8.5 - 10.1 mg/dL Final         Passed - Cr in normal range and within 360 days    Creatinine  Date Value Ref Range Status  06/09/2012 1.02 0.60 - 1.30 mg/dL Final   Creatinine, Ser  Date Value Ref Range Status  10/13/2020 0.99 0.76 - 1.27 mg/dL Final         Passed - Last BP in normal range    BP Readings from Last 1 Encounters:  10/13/20 122/74         Passed - Valid encounter within last 6 months    Recent Outpatient Visits          1 week ago Dizziness   [facility] Vigg, [name], MD   2 months ago Dizziness   [facility] McElwee, Lauren A, NP   2 months ago Hypertension associated with diabetes (HCC)   [facility] Cannady, Jolene T, NP   3 months ago Hypertension associated with diabetes (HCC)   [facility] Cannady, Jolene T, NP   3 months ago Hypertension associated with diabetes (HCC)   [facility] Cannady, Dorie Rank, NP      Future Appointments            In 4 days Vigg, [name], MD [facility]   In 1 month Vigg, [name], MD [facility]   In 2 months Vigg,  [name], MD [facility]

## 2020-10-26 ENCOUNTER — Other Ambulatory Visit: Payer: Self-pay

## 2020-10-26 MED ORDER — EMPAGLIFLOZIN 25 MG PO TABS: 25.0000 mg | ORAL_TABLET | Freq: Every day | ORAL | 0 refills | Status: DC

## 2020-10-26 NOTE — Telephone Encounter (Signed)
Request for Refill of Furosemide, and Jardiance  LOV 10/13/20  Upcoming  Appt 01/13/21

## 2020-10-27 ENCOUNTER — Telehealth: Payer: Self-pay

## 2020-10-27 NOTE — Telephone Encounter (Signed)
That would be fine, I have been working with patient a lot since [place] moved.  I am okay with him switching to me for care at this time.

## 2020-10-27 NOTE — Telephone Encounter (Signed)
PT Would like to know if Cannady.Jolene will take him as a pt as he states he feels they have developed a good relationship and he would like to keep seeing her.

## 2020-10-28 ENCOUNTER — Telehealth: Payer: BLUE CROSS/BLUE SHIELD | Admitting: Internal Medicine

## 2020-10-28 NOTE — Telephone Encounter (Signed)
Pt verbalized understanding.

## 2020-10-29 ENCOUNTER — Encounter: Payer: Self-pay | Admitting: Nurse Practitioner

## 2020-10-29 ENCOUNTER — Ambulatory Visit: Payer: BLUE CROSS/BLUE SHIELD | Admitting: Family Medicine

## 2020-11-01 ENCOUNTER — Other Ambulatory Visit: Payer: Self-pay

## 2020-11-01 ENCOUNTER — Encounter: Payer: Self-pay | Admitting: Nurse Practitioner

## 2020-11-01 ENCOUNTER — Ambulatory Visit: Payer: BLUE CROSS/BLUE SHIELD | Admitting: Nurse Practitioner

## 2020-11-01 VITALS — BP 118/72 | HR 98 | Temp 97.5°F | Ht 72.0 in | Wt 235.6 lb

## 2020-11-01 DIAGNOSIS — Z6831 Body mass index (BMI) 31.0-31.9, adult: Secondary | ICD-10-CM

## 2020-11-01 DIAGNOSIS — E1169 Type 2 diabetes mellitus with other specified complication: Secondary | ICD-10-CM | POA: Diagnosis not present

## 2020-11-01 DIAGNOSIS — E1159 Type 2 diabetes mellitus with other circulatory complications: Secondary | ICD-10-CM | POA: Diagnosis not present

## 2020-11-01 DIAGNOSIS — I152 Hypertension secondary to endocrine disorders: Secondary | ICD-10-CM

## 2020-11-01 DIAGNOSIS — E1142 Type 2 diabetes mellitus with diabetic polyneuropathy: Secondary | ICD-10-CM | POA: Diagnosis not present

## 2020-11-01 DIAGNOSIS — E6609 Other obesity due to excess calories: Secondary | ICD-10-CM

## 2020-11-01 DIAGNOSIS — G959 Disease of spinal cord, unspecified: Secondary | ICD-10-CM | POA: Diagnosis not present

## 2020-11-01 DIAGNOSIS — E785 Hyperlipidemia, unspecified: Secondary | ICD-10-CM

## 2020-11-01 NOTE — Progress Notes (Signed)
BP 118/72   Pulse 98   Temp (!) 97.5 F (36.4 C) (Oral)   Ht 6' (1.829 m)   Wt 235 lb 9.6 oz (106.9 kg)   SpO2 95%   BMI 31.95 kg/m    Subjective:    Patient ID: Derek Garza, male    DOB: 1960-07-24, 60 y.o.   MRN: [id number]  HPI: Derek Garza is a 60 y.o. male  Chief Complaint  Patient presents with  . Paperwork    Follow up/discuss FMLA paperwork    DIZZINESS Seen for dizziness on 06/16/20 initially -- Lasix was decreased due to low BP (he decreased for brief period) and then Meclizine prescribed -- return visit symptoms continued and he was ordered PT for Epley treatment by Dr. Ky Barban -- he continues to work with PT twice a week.  His initial symptoms started January 7th -- was seen in office initially for issue 06/08/20.  Does take Meclizine as needed, takes this and sits down which helps episode pass.  Have adjusted BP medications due to concern for orthostatic levels.  Currently takes Lasix 40 MG (was 80 MG) and Benazepril 40 MG and K+.  Have discussed at length further reduction of BP meds, but he is hesitant about this as has concerns about edema.    MRI on 07/30/20 showed no acute findings, some chronic microvascular changes are present.  He is scheduled to see neurology 09/24/20.  He has a history of Covid in winter 2020 -- but dizziness did not present until months later.   Saw neurology on 09/24/20, they suspect coming more from cervical spine.  Plan is for cervical decompression surgery.  Saw neurosurgery 10/15/20.  Recent A1c 6.7% on 10/13/20, NA+ 133.  Surgery is scheduled for June 20th.    Feels like he wishes not to return to work until after further neuro assessment.  Continues to have some wobbly episodes.  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   Duration: weeks Description of symptoms: room spinning Duration of episode: seconds Dizziness frequency: recurrent Provoking factors: position  changes Aggravating factors:  position changes Triggered by rolling over in bed: no Triggered by bending over: no Aggravated by head movement: yes Aggravated by exertion, coughing, loud noises: no Recent head injury: no Recent or current viral symptoms: no History of vasovagal episodes: no Nausea: no Vomiting: no Tinnitus: no Hearing loss: no Aural fullness: no Headache: no Photophobia/phonophobia: no Unsteady gait: no Postural instability: no Diplopia, dysarthria, dysphagia or weakness: no Related to exertion: no Pallor: no Diaphoresis: no Dyspnea: no Chest pain: no  Relevant past medical, surgical, family and social history reviewed and updated as indicated. Interim medical history since our last visit reviewed. Allergies and medications reviewed and updated.  Review of Systems  Constitutional: Negative for activity change, diaphoresis, fatigue and fever.  Respiratory: Negative for cough, chest tightness, shortness of breath and wheezing.   Cardiovascular: Negative for chest pain, palpitations and leg swelling.  Gastrointestinal: Negative.   Neurological: Positive for dizziness. Negative for syncope, weakness, numbness and headaches.  Psychiatric/Behavioral: Negative.     Per HPI unless specifically indicated above     Objective:    BP 118/72   Pulse 98   Temp (!) 97.5 F (36.4 C) (Oral)   Ht 6' (1.829 m)   Wt 235 lb 9.6 oz (106.9 kg)   SpO2 95%   BMI 31.95 kg/m   Wt Readings from Last  3 Encounters:  11/01/20 235 lb 9.6 oz (106.9 kg)  10/13/20 238 lb 12.8 oz (108.3 kg)  09/24/20 237 lb 8 oz (107.7 kg)    Physical Exam Vitals and nursing note reviewed.  Constitutional:      General: He is awake. He is not in acute distress.    Appearance: He is well-developed and well-groomed. He is obese. He is not ill-appearing.  HENT:     Head: Normocephalic and atraumatic.     Right Ear: Hearing, tympanic membrane, ear canal and external ear normal. No drainage.      Left Ear: Hearing, tympanic membrane, ear canal and external ear normal. No drainage.  Eyes:     General: Lids are normal.        Right eye: No discharge.        Left eye: No discharge.     Conjunctiva/sclera: Conjunctivae normal.     Pupils: Pupils are equal, round, and reactive to light.  Neck:     Thyroid: No thyromegaly.     Vascular: No carotid bruit.     Trachea: Trachea normal.  Cardiovascular:     Rate and Rhythm: Normal rate and regular rhythm.     Heart sounds: Normal heart sounds, S1 normal and S2 normal. No murmur heard. No gallop.   Pulmonary:     Effort: Pulmonary effort is normal. No accessory muscle usage or respiratory distress.     Breath sounds: Normal breath sounds.  Abdominal:     General: Bowel sounds are normal.     Palpations: Abdomen is soft. There is no hepatomegaly or splenomegaly.  Musculoskeletal:        General: Normal range of motion.     Cervical back: Normal range of motion and neck supple.     Right lower leg: No edema.     Left lower leg: No edema.  Lymphadenopathy:     Cervical: No cervical adenopathy.  Skin:    General: Skin is warm and dry.     Capillary Refill: Capillary refill takes less than 2 seconds.     Findings: No rash.  Neurological:     Mental Status: He is alert and oriented to person, place, and time.     Cranial Nerves: Cranial nerves are intact.     Coordination: Coordination is intact.     Gait: Gait is intact.     Deep Tendon Reflexes: Reflexes are normal and symmetric.     Reflex Scores:      Brachioradialis reflexes are 2+ on the right side and 2+ on the left side.      Patellar reflexes are 2+ on the right side and 2+ on the left side. Psychiatric:        Attention and Perception: Attention normal.        Mood and Affect: Mood normal.        Speech: Speech normal.        Behavior: Behavior normal. Behavior is cooperative.        Thought Content: Thought content normal.    Results for orders placed or performed  in visit on 10/13/20  Bayer DCA Hb A1c Waived  Result Value Ref Range   HB A1C (BAYER DCA - WAIVED) 6.7 <5.7 %  Basic metabolic panel  Result Value Ref Range   Glucose 225 (H) 65 - 99 mg/dL   BUN 18 8 - 27 mg/dL   Creatinine, Ser 0.99 0.76 - 1.27 mg/dL   eGFR 87 >59 mL/min/1.73  BUN/Creatinine Ratio 18 10 - 24   Sodium 133 (L) 134 - 144 mmol/L   Potassium 4.6 3.5 - 5.2 mmol/L   Chloride 100 96 - 106 mmol/L   CO2 16 (L) 20 - 29 mmol/L   Calcium 9.2 8.6 - 10.2 mg/dL  Urinalysis, Routine w reflex microscopic  Result Value Ref Range   Specific Gravity, UA 1.010 1.005 - 1.030   pH, UA 5.5 5.0 - 7.5   Color, UA Yellow Yellow   Appearance Ur Clear Clear   Leukocytes,UA Negative Negative   Protein,UA Negative Negative/Trace   Glucose, UA 3+ (A) Negative   Ketones, UA Negative Negative   RBC, UA Negative Negative   Bilirubin, UA Negative Negative   Urobilinogen, Ur 0.2 0.2 - 1.0 mg/dL   Nitrite, UA Negative Negative      Assessment & Plan:   Problem List Items Addressed This Visit      Cardiovascular and Mediastinum   Hypertension associated with diabetes (HCC)    Chronic, well-controlled.  Continue current medications, he refuses to reduce Lasix further as reports edema presents with lower doses.  Recommend he monitor BP at least a few mornings a week at home and document for provider visits.  DASH diet at home.  Continue current Benazepril at current dosing.  Return in August for follow-up.         Endocrine   DM type 2 with diabetic peripheral neuropathy (HCC)    Chronic, stable with A1c in May 6.7%.  Continue current medication regimen and adjust as needed, if continues good control next visit could consider reduction of Metformin or Jardiance.  Continue Gabapentin for neuropathy pain and dose renally as needed.  At this time recommend he monitor BS daily and document for visits + focus on diabetic diet.  Urine ALB up to date.  Return in August for follow-up.       Hyperlipidemia associated with type 2 diabetes mellitus (HCC)    Chronic, ongoing.  Continue current medication regimen and adjust as needed.  Lipid panel up to date.         Nervous and Auditory   Cervical myelopathy ([place]) - Primary    Continue collaboration with neurology and neurosurgery, is scheduled June 20th.          Other   Obesity    BMI 31.95.  Recommended eating smaller high protein, low fat meals more frequently and exercising 30 mins a day 5 times a week with a goal of 10-15lb weight loss in the next 3 months. Patient voiced their understanding and motivation to adhere to these recommendations.           Follow up plan: Return in about 2 months (around 01/12/2021) for T2DM, HTN/HLD, BIPOLAR, DIZZINESS.

## 2020-11-01 NOTE — Assessment & Plan Note (Signed)
BMI 31.95.  Recommended eating smaller high protein, low fat meals more frequently and exercising 30 mins a day 5 times a week with a goal of 10-15lb weight loss in the next 3 months. Patient voiced their understanding and motivation to adhere to these recommendations.

## 2020-11-01 NOTE — Assessment & Plan Note (Signed)
Chronic, well-controlled.  Continue current medications, he refuses to reduce Lasix further as reports edema presents with lower doses.  Recommend he monitor BP at least a few mornings a week at home and document for provider visits.  DASH diet at home.  Continue current Benazepril at current dosing.  Return in August for follow-up.

## 2020-11-01 NOTE — Assessment & Plan Note (Signed)
Chronic, ongoing.  Continue current medication regimen and adjust as needed.  Lipid panel up to date. 

## 2020-11-01 NOTE — Assessment & Plan Note (Signed)
Continue collaboration with neurology and neurosurgery, is scheduled June 20th.

## 2020-11-01 NOTE — Patient Instructions (Signed)
[employer] neurological surgery (7th ed., pp. 2373-2383). [place], [place]: Else[employer]."> [employer] neurological surgery (7th ed., pp. 2344-2347). [place], [place]: [employer].">  Spinal Stenosis  Spinal stenosis is a condition that happens when the spinal canal narrows. The spinal canal is the space between the bones of your spine (vertebrae). This narrowing puts pressure on the spinal cord or nerves. Spinal stenosis can affect the vertebrae in the neck, upper back, and lower back. This condition can range from mild to severe. In some cases, there are no symptoms. What are the causes? This condition is caused by areas of bone pushing into the spinal canal. This condition may be present at birth (congenital), or it may be caused by:  Slow breakdown of your vertebrae (spinal degeneration). This usually starts around 61 years of age.  Injury (trauma) to your spine.  Tumors in your spine.  Calcium deposits in your spine. What increases the risk? The following factors may make you more likely to develop this condition:  Being older than age 50.  Having a problem present at birth with an abnormally shaped spine (congenitalspinal deformity), such as scoliosis.  Having arthritis. What are the signs or symptoms? Symptoms of this condition include:  Pain in the neck or back that is generally worse with activities, particularly when you stand or walk.  Numbness, tingling, hot or cold sensations, weakness, or tiredness (fatigue) in your leg or legs.  Pain going from the buttock, down the thigh, and to the calf (sciatica). This can happen in one or both legs.  Frequent episodes of falling.  A foot-slapping gait that leads to muscle weakness. In more severe cases, you may develop:  Problems having a bowel movement or urinating.  Difficulty having sex.  Loss of feeling in your legs and inability to walk. Symptoms may come on slowly and get worse over time. In some cases, there  are no symptoms. How is this diagnosed? This condition is diagnosed based on your medical history and a physical exam. You will also have tests, such as an MRI, a CT scan, or an X-ray. How is this treated? Treatment for this condition often focuses on managing your pain and any other symptoms. Treatment may include:  Practicing good posture to lessen pressure on your nerves.  Exercising to strengthen muscles, build endurance, improve balance, and maintain range of motion. This may include physical therapy to restore movement and strength to your back.  Losing weight, if needed.  Medicines to reduce inflammation or pain. This may include a medicine that is injected into your spine (steroidinjection).  Assistive devices, such as a corset or brace. In some cases, surgery may be needed. The most common procedure is decompression laminectomy. This is done to remove excess bone that puts pressure on your nerve roots. Follow these instructions at home: Managing pain, stiffness, and swelling  Practice good posture. If you were given a brace or a corset, wear it as told by your health care provider.  Maintain a healthy weight. Talk with your health care provider if you need help losing weight.  If directed, apply heat to the affected area as often as told by your health care provider. Use the heat source that your health care provider recommends, such as a moist heat pack or a heating pad. ? Place a towel between your skin and the heat source. ? Leave the heat on for 20-30 minutes. ? Remove the heat if your skin turns bright red. This is especially important if   you are unable to feel pain, heat, or cold. You may have a greater risk of getting burned.   Activity  Do all exercises and stretches as told by your health care provider.  Do not do any activities that cause pain. Ask your health care provider what activities are safe for you.  Do not lift anything that is heavier than 10 lb (4.5 kg),  or the limit that you are told by your health care provider.  Return to your normal activities as told by your health care provider. Ask your health care provider what activities are safe for you. General instructions  Take over-the-counter and prescription medicines only as told by your health care provider.  Do not use any products that contain nicotine or tobacco, such as cigarettes, e-cigarettes, and chewing tobacco. If you need help quitting, ask your health care provider.  Eat a healthy diet. This includes plenty of fruits and vegetables, whole grains, and low-fat (lean) protein.  Keep all follow-up visits as told by your health care provider. This is important. Contact a health care provider if:  Your symptoms do not get better or they get worse.  You have a fever. Get help right away if:  You have new pain or symptoms of severe pain, such as: ? New or worsening pain in your neck or upper back. ? Severe pain that cannot be controlled with medicines. ? A severe headache that gets worse when you stand.  You are dizzy.  You have vision problems, such as blurred vision or double vision.  You have nausea or you vomit.  You develop new or worsening numbness or tingling in your back or legs.  You have pain, redness, swelling, or warmth in your arm or leg. Summary  Spinal stenosis is a condition that happens when the spinal canal narrows. The spinal canal is the space between the bones of your spine (vertebrae). This narrowing puts pressure on the spinal cord or nerves.  This condition may be caused by a birth defect, breakdown of your vertebrae, trauma, tumors, or calcium deposits.  Spinal stenosis can cause numbness, weakness, or pain in the buttocks, neck, back, and legs.  This condition is usually diagnosed with your medical history, a physical exam, and tests, such as an MRI, a CT scan, or an X-ray. This information is not intended to replace advice given to you by your  health care provider. Make sure you discuss any questions you have with your health care provider. Document Revised: 03/13/2019 Document Reviewed: 03/13/2019 Elsevier Patient Education  2021 [employer] Inc.  

## 2020-11-01 NOTE — Assessment & Plan Note (Signed)
Chronic, stable with A1c in May 6.7%.  Continue current medication regimen and adjust as needed, if continues good control next visit could consider reduction of Metformin or Jardiance.  Continue Gabapentin for neuropathy pain and dose renally as needed.  At this time recommend he monitor BS daily and document for visits + focus on diabetic diet.  Urine ALB up to date.  Return in August for follow-up.

## 2020-11-11 ENCOUNTER — Other Ambulatory Visit: Payer: Self-pay | Admitting: Internal Medicine

## 2020-11-12 ENCOUNTER — Ambulatory Visit: Payer: BLUE CROSS/BLUE SHIELD | Admitting: Nurse Practitioner

## 2020-11-15 ENCOUNTER — Encounter: Payer: BLUE CROSS/BLUE SHIELD | Admitting: Neurology

## 2020-11-19 ENCOUNTER — Other Ambulatory Visit: Payer: Self-pay | Admitting: Internal Medicine

## 2020-11-22 ENCOUNTER — Other Ambulatory Visit: Payer: Self-pay | Admitting: Internal Medicine

## 2020-11-22 NOTE — Telephone Encounter (Signed)
[facility] called and spoke to Nambe, [facility] about the refill(s) Benazepril requested. Advised it was sent on 08/04/20 #90/1 refill(s). Renae Fickle says the patient picked up in March, then at another [facility] on 11/18/20, so the automated system is sending a refill request for the next time it's due.

## 2020-11-23 ENCOUNTER — Other Ambulatory Visit: Payer: Self-pay | Admitting: Nurse Practitioner

## 2020-11-23 ENCOUNTER — Other Ambulatory Visit: Payer: Self-pay | Admitting: Internal Medicine

## 2020-11-30 ENCOUNTER — Encounter: Payer: BLUE CROSS/BLUE SHIELD | Admitting: Internal Medicine

## 2020-11-30 ENCOUNTER — Encounter: Payer: BLUE CROSS/BLUE SHIELD | Admitting: Neurology

## 2020-11-30 ENCOUNTER — Ambulatory Visit: Payer: BLUE CROSS/BLUE SHIELD | Admitting: Internal Medicine

## 2020-12-03 ENCOUNTER — Encounter: Payer: Self-pay | Admitting: Nurse Practitioner

## 2020-12-03 ENCOUNTER — Ambulatory Visit (INDEPENDENT_AMBULATORY_CARE_PROVIDER_SITE_OTHER): Payer: BLUE CROSS/BLUE SHIELD | Admitting: Nurse Practitioner

## 2020-12-03 ENCOUNTER — Other Ambulatory Visit: Payer: Self-pay

## 2020-12-03 VITALS — BP 112/71 | HR 88 | Temp 98.4°F | Ht 70.5 in | Wt 228.8 lb

## 2020-12-03 DIAGNOSIS — G959 Disease of spinal cord, unspecified: Secondary | ICD-10-CM

## 2020-12-03 DIAGNOSIS — Z23 Encounter for immunization: Secondary | ICD-10-CM

## 2020-12-03 DIAGNOSIS — I152 Hypertension secondary to endocrine disorders: Secondary | ICD-10-CM

## 2020-12-03 DIAGNOSIS — E6609 Other obesity due to excess calories: Secondary | ICD-10-CM

## 2020-12-03 DIAGNOSIS — E1159 Type 2 diabetes mellitus with other circulatory complications: Secondary | ICD-10-CM

## 2020-12-03 DIAGNOSIS — E1169 Type 2 diabetes mellitus with other specified complication: Secondary | ICD-10-CM

## 2020-12-03 DIAGNOSIS — Z0000 Encounter for general adult medical examination without abnormal findings: Secondary | ICD-10-CM | POA: Diagnosis not present

## 2020-12-03 DIAGNOSIS — F3181 Bipolar II disorder: Secondary | ICD-10-CM

## 2020-12-03 DIAGNOSIS — R42 Dizziness and giddiness: Secondary | ICD-10-CM

## 2020-12-03 DIAGNOSIS — E785 Hyperlipidemia, unspecified: Secondary | ICD-10-CM

## 2020-12-03 DIAGNOSIS — E538 Deficiency of other specified B group vitamins: Secondary | ICD-10-CM

## 2020-12-03 DIAGNOSIS — Z1211 Encounter for screening for malignant neoplasm of colon: Secondary | ICD-10-CM

## 2020-12-03 DIAGNOSIS — E1142 Type 2 diabetes mellitus with diabetic polyneuropathy: Secondary | ICD-10-CM | POA: Diagnosis not present

## 2020-12-03 DIAGNOSIS — E291 Testicular hypofunction: Secondary | ICD-10-CM

## 2020-12-03 DIAGNOSIS — Z6832 Body mass index (BMI) 32.0-32.9, adult: Secondary | ICD-10-CM

## 2020-12-03 LAB — MICROALBUMIN, URINE WAIVED
Creatinine, Urine Waived: 50 mg/dL (ref 10–300)
Microalb, Ur Waived: 10 mg/L (ref 0–19)

## 2020-12-03 LAB — BAYER DCA HB A1C WAIVED: HB A1C (BAYER DCA - WAIVED): 6.8 % (ref <7.0)

## 2020-12-03 MED ORDER — SHINGRIX 50 MCG/0.5ML IM SUSR: 0.5000 mL | Freq: Once | INTRAMUSCULAR | 0 refills | Status: AC

## 2020-12-03 NOTE — Assessment & Plan Note (Signed)
Chronic, ongoing.  At this time continue collaboration with psychiatry and current medication regimen as prescribed by them. 

## 2020-12-03 NOTE — Assessment & Plan Note (Signed)
BMI 32.37.  Recommended eating smaller high protein, low fat meals more frequently and exercising 30 mins a day 5 times a week with a goal of 10-15lb weight loss in the next 3 months. Patient voiced their understanding and motivation to adhere to these recommendations.

## 2020-12-03 NOTE — Assessment & Plan Note (Signed)
Will continue collaboration with neurology and neurosurgery, continue Antivert as needed.

## 2020-12-03 NOTE — Assessment & Plan Note (Signed)
Chronic, ongoing.  Continue current medication regimen and adjust as needed. Lipid panel today. 

## 2020-12-03 NOTE — Progress Notes (Signed)
BP 112/71   Pulse 88 Comment: apical  Temp 98.4 F (36.9 C) (Oral)   Ht 5' 10.5" (1.791 m)   Wt 228 lb 12.8 oz (103.8 kg)   SpO2 (!) 71%   BMI 32.37 kg/m    Subjective:    Patient ID: Derek Garza, male    DOB: May 06, 1961, 60 y.o.   MRN: [id number]  HPI: Derek Garza is a 60 y.o. male presenting on 12/03/2020 for comprehensive medical examination. Current medical complaints include:none  He currently lives with:self Interim Problems from his last visit: no  Continues on Seroquel for Bipolar, which works well for him.  Sees Dr. Deberah Pelton in [facility] for psychiatry.  He reports that he has had a lot of success with this psychiatrist.  DIABETES Continues as Metformin 500 MG BID and Jardiance 25 MG QDAY.  Taking Gabapentin 600 MG BID for neuropathy discomfort. Hypoglycemic episodes:no Polydipsia/polyuria: no Visual disturbance: no Chest pain: no Paresthesias: no Glucose Monitoring: yes             Accucheck frequency: Daily             Fasting glucose: 100 to 110 range             Post prandial:             Evening:             Before meals: Taking Insulin?: no             Long acting insulin:             Short acting insulin: Blood Pressure Monitoring: not checking Retinal Examination: Up to Date Foot Exam: Up to Date Pneumovax: Up to Date Influenza: Up to Date Aspirin: no    HYPERTENSION / HYPERLIPIDEMIA Continues on Benazepril 40 MG daily, Lasix 80 MG daily, K+, and Atorvastatin 10 MG daily. Satisfied with current treatment? yes Duration of hypertension: chronic BP monitoring frequency: not checking BP range: BP medication side effects: no Duration of hyperlipidemia: chronic Cholesterol medication side effects: no Cholesterol supplements: none Medication compliance: good compliance Aspirin: no Recent stressors: no Recurrent headaches: no Visual changes: no Palpitations: no Dyspnea: no Chest pain: no Lower extremity edema: no Dizzy/lightheaded:  occasional   DIZZINESS Continues to be followed by neurosurgery.  Continues to use Antivert as needed. Had recent cervical spine surgery.   Duration: weeks Description of symptoms: room spinning Duration of episode: seconds Dizziness frequency: recurrent Provoking factors: position changes Aggravating factors:  position changes Triggered by rolling over in bed: no Triggered by bending over: no Aggravated by head movement: yes Aggravated by exertion, coughing, loud noises: no Recent head injury: no Recent or current viral symptoms: no History of vasovagal episodes: no Nausea: no Vomiting: no Tinnitus: no Hearing loss: no Aural fullness: no Headache: no Photophobia/phonophobia: no Unsteady gait: no Postural instability: no Diplopia, dysarthria, dysphagia or weakness: no Related to exertion: no Pallor: no Diaphoresis: no Dyspnea: no Chest pain: no   Functional Status Survey: Is the patient deaf or have difficulty hearing?: No Does the patient have difficulty seeing, even when wearing glasses/contacts?: No Does the patient have difficulty concentrating, remembering, or making decisions?: No Does the patient have difficulty walking or climbing stairs?: No Does the patient have difficulty dressing or bathing?: No Does the patient have difficulty doing errands alone such as visiting a doctor's office or shopping?: No  FALL RISK: Fall Risk  10/13/2020 07/14/2020 06/29/2020 06/08/2020 11/07/2016  Falls in the past year? 1  1 1 1  No  Number falls in past yr: 1 1 1  - -  Injury with Fall? 0 1 0 - -  Risk for fall due to : Impaired balance/gait - - History of fall(s) -  Follow up Falls evaluation completed - - - -    Depression Screen Depression screen [facility] 2/9 12/03/2020 10/13/2020 06/08/2020 11/18/2019 08/31/2016  Decreased Interest 0 0 0 0 0  Down, Depressed, Hopeless - 0 0 0 0  PHQ - 2 Score 0 0 0 0 0  Altered sleeping - - 0 0 -  Tired, decreased energy - - 0 0 -  Change in appetite  - - 0 0 -  Feeling bad or failure about yourself  - - 0 0 -  Trouble concentrating - - 0 0 -  Moving slowly or fidgety/restless - - 0 0 -  Suicidal thoughts - - 0 0 -  PHQ-9 Score - - 0 0 -  Difficult doing work/chores - - - Not difficult at all -    Advanced Directives <no information>  Past Medical History:  Past Medical History:  Diagnosis Date   Diabetes mellitus without complication ([place])    Hypertension     Surgical History:  Past Surgical History:  Procedure Laterality Date   ANKLE SURGERY  1979   APPENDECTOMY  2011   HERNIA REPAIR  [phone]   VASECTOMY      Medications:  Current Outpatient Medications on File Prior to Visit  Medication Sig   ALPRAZolam (XANAX) 0.5 MG tablet Take 1-2 tablets thirty minutes prior to MRI.  May take one additional tablet before entering scanner, if needed.  MUST HAVE DRIVER. (Patient taking differently: Take 1-2 tablets thirty minutes prior to MRI.  May take one additional tablet before entering scanner, if needed.  MUST HAVE DRIVER.)   atorvastatin (LIPITOR) 10 MG tablet Take 1 tablet (10 mg total) by mouth daily.   benazepril (LOTENSIN) 40 MG tablet TAKE 1 TABLET BY MOUTH EVERY DAY   carbamazepine (TEGRETOL XR) 200 MG 12 hr tablet 200 mg 2 (two) times daily.   empagliflozin (JARDIANCE) 25 MG TABS tablet Take 1 tablet (25 mg total) by mouth daily.   furosemide (LASIX) 80 MG tablet Take 80 mg by mouth daily.   gabapentin (NEURONTIN) 600 MG tablet Take 1 tablet (600 mg total) by mouth 2 (two) times daily.   HYDROcodone-acetaminophen (NORCO/VICODIN) 5-325 MG tablet Take 1 tablet by mouth every 8 (eight) hours as needed.   meclizine (ANTIVERT) 25 MG tablet Take 1 tablet (25 mg total) by mouth 3 (three) times daily as needed for dizziness.   metFORMIN (GLUCOPHAGE) 500 MG tablet Take 1 tablet (500 mg total) by mouth 2 (two) times daily with a meal.   potassium chloride (KLOR-CON) 10 MEQ tablet TAKE 1 TABLET BY MOUTH EVERY DAY   QUEtiapine  (SEROQUEL) 400 MG tablet Take 400 mg by mouth daily.    tadalafil (CIALIS) 20 MG tablet Take 1 tablet (20 mg total) by mouth daily as needed for erectile dysfunction.   No current facility-administered medications on file prior to visit.    Allergies:  Allergies  Allergen Reactions   Acetaminophen    Percocet [Oxycodone-Acetaminophen] Nausea Only    Social History:  Social History   Socioeconomic History   Marital status: Divorced    Spouse name: Not on file   Number of children: Not on file   Years of education: Not on file   Highest education level: Not on  file  Occupational History   Occupation: on [employer]  Tobacco Use   Smoking status: Never   Smokeless tobacco: Never  Vaping Use   Vaping Use: Never used  Substance and Sexual Activity   Alcohol use: Yes    Comment: on occasion   Drug use: No   Sexual activity: Not on file  Other Topics Concern   Not on file  Social History Narrative   Lives alone   Right Handed   Drinks 2 liter of soda daily   Social Determinants of Health   Financial Resource Strain: Not on file  Food Insecurity: Not on file  Transportation Needs: Not on file  Physical Activity: Not on file  Stress: Not on file  Social Connections: Not on file  Intimate Partner Violence: Not on file   Social History   Tobacco Use  Smoking Status Never  Smokeless Tobacco Never   Social History   Substance and Sexual Activity  Alcohol Use Yes   Comment: on occasion    Family History:  Family History  Problem Relation Age of Onset   Breast cancer Sister     Past medical history, surgical history, medications, allergies, family history and social history reviewed with patient today and changes made to appropriate areas of the chart.   ROS All other ROS negative except what is listed above and in the HPI.      Objective:    BP 112/71   Pulse 88 Comment: apical  Temp 98.4 F (36.9 C) (Oral)   Ht 5' 10.5" (1.791 m)   Wt 228 lb 12.8 oz  (103.8 kg)   SpO2 (!) 71%   BMI 32.37 kg/m   Wt Readings from Last 3 Encounters:  12/03/20 228 lb 12.8 oz (103.8 kg)  11/01/20 235 lb 9.6 oz (106.9 kg)  10/13/20 238 lb 12.8 oz (108.3 kg)    Physical Exam Vitals and nursing note reviewed.  Constitutional:      General: He is awake. He is not in acute distress.    Appearance: He is well-developed and well-groomed. He is morbidly obese. He is not ill-appearing or toxic-appearing.  HENT:     Head: Normocephalic and atraumatic.     Right Ear: Hearing, tympanic membrane, ear canal and external ear normal. No drainage.     Left Ear: Hearing, tympanic membrane, ear canal and external ear normal. No drainage.     Nose: Nose normal.     Mouth/Throat:     Pharynx: Uvula midline.  Eyes:     General: Lids are normal.        Right eye: No discharge.        Left eye: No discharge.     Extraocular Movements: Extraocular movements intact.     Conjunctiva/sclera: Conjunctivae normal.     Pupils: Pupils are equal, round, and reactive to light.     Visual Fields: Right eye visual fields normal and left eye visual fields normal.  Neck:     Thyroid: No thyromegaly.     Vascular: No carotid bruit or JVD.     Trachea: Trachea normal.  Cardiovascular:     Rate and Rhythm: Normal rate and regular rhythm.     Heart sounds: Normal heart sounds, S1 normal and S2 normal. No murmur heard.   No gallop.  Pulmonary:     Effort: Pulmonary effort is normal. No accessory muscle usage or respiratory distress.     Breath sounds: Normal breath sounds.  Abdominal:  General: Bowel sounds are normal.     Palpations: Abdomen is soft. There is no hepatomegaly or splenomegaly.     Tenderness: There is no abdominal tenderness.  Musculoskeletal:        General: Normal range of motion.     Cervical back: Normal range of motion and neck supple.     Right lower leg: No edema.     Left lower leg: No edema.  Lymphadenopathy:     Head:     Right side of head: No  submental, submandibular, tonsillar, preauricular or posterior auricular adenopathy.     Left side of head: No submental, submandibular, tonsillar, preauricular or posterior auricular adenopathy.     Cervical: No cervical adenopathy.  Skin:    General: Skin is warm and dry.     Capillary Refill: Capillary refill takes less than 2 seconds.     Findings: No rash.     Comments: Small, 3 cm, incision line to anterior neck -- well approximated with no drainage.  Neurological:     Mental Status: He is alert and oriented to person, place, and time.     Cranial Nerves: Cranial nerves are intact.     Gait: Gait is intact.     Deep Tendon Reflexes: Reflexes are normal and symmetric.     Reflex Scores:      Brachioradialis reflexes are 2+ on the right side and 2+ on the left side.      Patellar reflexes are 2+ on the right side and 2+ on the left side. Psychiatric:        Attention and Perception: Attention normal.        Mood and Affect: Mood normal.        Speech: Speech normal.        Behavior: Behavior normal. Behavior is cooperative.        Thought Content: Thought content normal.        Cognition and Memory: Cognition normal.        Judgment: Judgment normal.   Simple Foot Form Visual Inspection No deformities, no ulcerations, no other skin breakdown bilaterally: Yes Sensation Testing Intact to touch and monofilament testing bilaterally: Yes Pulse Check Posterior Tibialis and Dorsalis pulse intact bilaterally: Yes Comments    Results for orders placed or performed in visit on 10/13/20  Bayer DCA Hb A1c Waived  Result Value Ref Range   HB A1C (BAYER DCA - WAIVED) 6.7 <4.5 %  Basic metabolic panel  Result Value Ref Range   Glucose 225 (H) 65 - 99 mg/dL   BUN 18 8 - 27 mg/dL   Creatinine, Ser 0.99 0.76 - 1.27 mg/dL   eGFR 87 >59 mL/min/1.73   BUN/Creatinine Ratio 18 10 - 24   Sodium 133 (L) 134 - 144 mmol/L   Potassium 4.6 3.5 - 5.2 mmol/L   Chloride 100 96 - 106 mmol/L   CO2  16 (L) 20 - 29 mmol/L   Calcium 9.2 8.6 - 10.2 mg/dL  Urinalysis, Routine w reflex microscopic  Result Value Ref Range   Specific Gravity, UA 1.010 1.005 - 1.030   pH, UA 5.5 5.0 - 7.5   Color, UA Yellow Yellow   Appearance Ur Clear Clear   Leukocytes,UA Negative Negative   Protein,UA Negative Negative/Trace   Glucose, UA 3+ (A) Negative   Ketones, UA Negative Negative   RBC, UA Negative Negative   Bilirubin, UA Negative Negative   Urobilinogen, Ur 0.2 0.2 - 1.0 mg/dL   Nitrite,  UA Negative Negative      Assessment & Plan:   Problem List Items Addressed This Visit       Cardiovascular and Mediastinum   Hypertension associated with diabetes ([place])    Chronic, well-controlled.  Continue current medications, he refuses to reduce Lasix further as reports edema presents with lower doses.  Recommend he monitor BP at least a few mornings a week at home and document for provider visits.  DASH diet at home.  Continue current Benazepril at current dosing.  Labs: CBC, CMP, TSH.  Return in  3 months.       Relevant Medications   furosemide (LASIX) 80 MG tablet   Other Relevant Orders   CBC with Differential/Platelet   TSH   Microalbumin, Urine Waived   Bayer DCA Hb A1c Waived     Endocrine   DM type 2 with diabetic peripheral neuropathy (HCC) - Primary    Chronic, stable with A1c 6.8% today and urine ALB 10, A:C 30-300.  Continue current medication regimen and adjust as needed, if continues good control could consider reduction of Metformin or Jardiance in future.  Continue Gabapentin for neuropathy pain and dose renally as needed.  At this time recommend he monitor BS daily and document for visits + focus on diabetic diet. Return in 3 months for follow-up.       Relevant Orders   Microalbumin, Urine Waived   Bayer DCA Hb A1c Waived   Hypogonadism in male    Ongoing, will continue ED medication as needed and check PSA today.       Relevant Orders   PSA   Hyperlipidemia  associated with type 2 diabetes mellitus (HCC)    Chronic, ongoing.  Continue current medication regimen and adjust as needed.  Lipid panel today.       Relevant Orders   Comprehensive metabolic panel   Lipid Panel w/o Chol/HDL Ratio   Microalbumin, Urine Waived   Bayer DCA Hb A1c Waived     Nervous and Auditory   Cervical myelopathy (HCC)    Continue collaboration with neurology and neurosurgery, recent surgery performed and overall healing well.  Recent notes reviewed.         Other   Bipolar 2 disorder (HCC)    Chronic, ongoing.  At this time continue collaboration with psychiatry and current medication regimen as prescribed by them.       Dizziness    Will continue collaboration with neurology and neurosurgery, continue Antivert as needed.       Obesity    BMI 32.37.  Recommended eating smaller high protein, low fat meals more frequently and exercising 30 mins a day 5 times a week with a goal of 10-15lb weight loss in the next 3 months. Patient voiced their understanding and motivation to adhere to these recommendations.        Other Visit Diagnoses     B12 deficiency       Reports history of low levels, check today and start supplement as needed.   Relevant Orders   Vitamin B12   Colon cancer screening       GI referral in place   Relevant Orders   Ambulatory referral to Gastroenterology   Need for shingles vaccine       Shingles vaccines ordered   Annual physical exam       Annual physical today with labs obtained and health maintenance reviewed.       Discussed aspirin prophylaxis for myocardial infarction prevention  and decision was it was not indicated  LABORATORY TESTING:  Health maintenance labs ordered today as discussed above.   The natural history of prostate cancer and ongoing controversy regarding screening and potential treatment outcomes of prostate cancer has been discussed with the patient. The meaning of a false positive PSA and a false  negative PSA has been discussed. He indicates understanding of the limitations of this screening test and wishes to proceed with screening PSA testing.   IMMUNIZATIONS:   - Tdap: Tetanus vaccination status reviewed: last tetanus booster within 10 years. - Influenza: Up to date - Pneumovax: Up to date - Prevnar: Not applicable - Zostavax vaccine:  ordered today  SCREENING: - Colonoscopy: ordered today Discussed with patient purpose of the colonoscopy is to detect colon cancer at curable precancerous or early stages   - AAA Screening: Not applicable  -Hearing Test: Not applicable  -Spirometry: Not applicable   PATIENT COUNSELING:    Sexuality: Discussed sexually transmitted diseases, partner selection, use of condoms, avoidance of unintended pregnancy  and contraceptive alternatives.   Advised to avoid cigarette smoking.  I discussed with the patient that most people either abstain from alcohol or drink within safe limits (<=14/week and <=4 drinks/occasion for males, <=7/weeks and <= 3 drinks/occasion for females) and that the risk for alcohol disorders and other health effects rises proportionally with the number of drinks per week and how often a drinker exceeds daily limits.  Discussed cessation/primary prevention of drug use and availability of treatment for abuse.   Diet: Encouraged to adjust caloric intake to maintain  or achieve ideal body weight, to reduce intake of dietary saturated fat and total fat, to limit sodium intake by avoiding high sodium foods and not adding table salt, and to maintain adequate dietary potassium and calcium preferably from fresh fruits, vegetables, and low-fat dairy products.    Stressed the importance of regular exercise  Injury prevention: Discussed safety belts, safety helmets, smoke detector, smoking near bedding or upholstery.   Dental health: Discussed importance of regular tooth brushing, flossing, and dental visits.   Follow up plan: NEXT  PREVENTATIVE PHYSICAL DUE IN 1 YEAR. Return in about 3 months (around 03/05/2021) for T2DM, HTN/HLD, MOOD.

## 2020-12-03 NOTE — Assessment & Plan Note (Signed)
Continue collaboration with neurology and neurosurgery, recent surgery performed and overall healing well.  Recent notes reviewed.

## 2020-12-03 NOTE — Assessment & Plan Note (Signed)
Chronic, stable with A1c 6.8% today and urine ALB 10, A:C 30-300.  Continue current medication regimen and adjust as needed, if continues good control could consider reduction of Metformin or Jardiance in future.  Continue Gabapentin for neuropathy pain and dose renally as needed.  At this time recommend he monitor BS daily and document for visits + focus on diabetic diet. Return in 3 months for follow-up.

## 2020-12-03 NOTE — Assessment & Plan Note (Signed)
Chronic, well-controlled.  Continue current medications, he refuses to reduce Lasix further as reports edema presents with lower doses.  Recommend he monitor BP at least a few mornings a week at home and document for provider visits.  DASH diet at home.  Continue current Benazepril at current dosing.  Labs: CBC, CMP, TSH.  Return in  3 months.

## 2020-12-03 NOTE — Patient Instructions (Signed)
Diabetes Mellitus and Nutrition, Adult When you have diabetes, or diabetes mellitus, it is very important to have healthy eating habits because your blood sugar (glucose) levels are greatly affected by what you eat and drink. Eating healthy foods in the right amounts, at about the same times every day, can help you:  Control your blood glucose.  Lower your risk of heart disease.  Improve your blood pressure.  Reach or maintain a healthy weight. What can affect my meal plan? Every person with diabetes is different, and each person has different needs for a meal plan. Your health care provider may recommend that you work with a dietitian to make a meal plan that is best for you. Your meal plan may vary depending on factors such as:  The calories you need.  The medicines you take.  Your weight.  Your blood glucose, blood pressure, and cholesterol levels.  Your activity level.  Other health conditions you have, such as heart or kidney disease. How do carbohydrates affect me? Carbohydrates, also called carbs, affect your blood glucose level more than any other type of food. Eating carbs naturally raises the amount of glucose in your blood. Carb counting is a method for keeping track of how many carbs you eat. Counting carbs is important to keep your blood glucose at a healthy level, especially if you use insulin or take certain oral diabetes medicines. It is important to know how many carbs you can safely have in each meal. This is different for every person. Your dietitian can help you calculate how many carbs you should have at each meal and for each snack. How does alcohol affect me? Alcohol can cause a sudden decrease in blood glucose (hypoglycemia), especially if you use insulin or take certain oral diabetes medicines. Hypoglycemia can be a life-threatening condition. Symptoms of hypoglycemia, such as sleepiness, dizziness, and confusion, are similar to symptoms of having too much  alcohol.  Do not drink alcohol if: ? Your health care provider tells you not to drink. ? You are pregnant, may be pregnant, or are planning to become pregnant.  If you drink alcohol: ? Do not drink on an empty stomach. ? Limit how much you use to:  0-1 drink a day for women.  0-2 drinks a day for men. ? Be aware of how much alcohol is in your drink. In the [place] one drink equals one 12 oz bottle of beer (355 mL), one 5 oz glass of wine (148 mL), or one 1 oz glass of hard liquor (44 mL). ? Keep yourself hydrated with water, diet soda, or unsweetened iced tea.  Keep in mind that regular soda, juice, and other mixers may contain a lot of sugar and must be counted as carbs. What are tips for following this plan? Reading food labels  Start by checking the serving size on the "Nutrition Facts" label of packaged foods and drinks. The amount of calories, carbs, fats, and other nutrients listed on the label is based on one serving of the item. Many items contain more than one serving per package.  Check the total grams (g) of carbs in one serving. You can calculate the number of servings of carbs in one serving by dividing the total carbs by 15. For example, if a food has 30 g of total carbs per serving, it would be equal to 2 servings of carbs.  Check the number of grams (g) of saturated fats and trans fats in one serving. Choose foods that have   a low amount or none of these fats.  Check the number of milligrams (mg) of salt (sodium) in one serving. Most people should limit total sodium intake to less than 2,300 mg per day.  Always check the nutrition information of foods labeled as "low-fat" or "nonfat." These foods may be higher in added sugar or refined carbs and should be avoided.  Talk to your dietitian to identify your daily goals for nutrients listed on the label. Shopping  Avoid buying canned, pre-made, or processed foods. These foods tend to be high in fat, sodium, and added  sugar.  Shop around the outside edge of the grocery store. This is where you will most often find fresh fruits and vegetables, bulk grains, fresh meats, and fresh dairy. Cooking  Use low-heat cooking methods, such as baking, instead of high-heat cooking methods like deep frying.  Cook using healthy oils, such as olive, canola, or sunflower oil.  Avoid cooking with butter, cream, or high-fat meats. Meal planning  Eat meals and snacks regularly, preferably at the same times every day. Avoid going long periods of time without eating.  Eat foods that are high in fiber, such as fresh fruits, vegetables, beans, and whole grains. Talk with your dietitian about how many servings of carbs you can eat at each meal.  Eat 4-6 oz (112-168 g) of lean protein each day, such as lean meat, chicken, fish, eggs, or tofu. One ounce (oz) of lean protein is equal to: ? 1 oz (28 g) of meat, chicken, or fish. ? 1 egg. ?  cup (62 g) of tofu.  Eat some foods each day that contain healthy fats, such as avocado, nuts, seeds, and fish.   What foods should I eat? Fruits Berries. Apples. Oranges. Peaches. Apricots. Plums. Grapes. Mango. Papaya. Pomegranate. Kiwi. Cherries. Vegetables Lettuce. Spinach. Leafy greens, including kale, chard, collard greens, and mustard greens. Beets. Cauliflower. Cabbage. Broccoli. Carrots. Green beans. Tomatoes. Peppers. Onions. Cucumbers. Brussels sprouts. Grains Whole grains, such as whole-wheat or whole-grain bread, crackers, tortillas, cereal, and pasta. Unsweetened oatmeal. Quinoa. Brown or wild rice. Meats and other proteins Seafood. Poultry without skin. Lean cuts of poultry and beef. Tofu. Nuts. Seeds. Dairy Low-fat or fat-free dairy products such as milk, yogurt, and cheese. The items listed above may not be a complete list of foods and beverages you can eat. Contact a dietitian for more information. What foods should I avoid? Fruits Fruits canned with  syrup. Vegetables Canned vegetables. Frozen vegetables with butter or cream sauce. Grains Refined white flour and flour products such as bread, pasta, snack foods, and cereals. Avoid all processed foods. Meats and other proteins Fatty cuts of meat. Poultry with skin. Breaded or fried meats. Processed meat. Avoid saturated fats. Dairy Full-fat yogurt, cheese, or milk. Beverages Sweetened drinks, such as soda or iced tea. The items listed above may not be a complete list of foods and beverages you should avoid. Contact a dietitian for more information. Questions to ask a health care provider  Do I need to meet with a diabetes educator?  Do I need to meet with a dietitian?  What number can I call if I have questions?  When are the best times to check my blood glucose? Where to find more information:  American Diabetes Association: diabetes.org  Academy of Nutrition and Dietetics: www.eatright.org  National Institute of Diabetes and Digestive and Kidney Diseases: www.niddk.nih.gov  Association of Diabetes Care and Education Specialists: www.diabeteseducator.org Summary  It is important to have healthy eating   habits because your blood sugar (glucose) levels are greatly affected by what you eat and drink.  A healthy meal plan will help you control your blood glucose and maintain a healthy lifestyle.  Your health care provider may recommend that you work with a dietitian to make a meal plan that is best for you.  Keep in mind that carbohydrates (carbs) and alcohol have immediate effects on your blood glucose levels. It is important to count carbs and to use alcohol carefully. This information is not intended to replace advice given to you by your health care provider. Make sure you discuss any questions you have with your health care provider. Document Revised: 04/22/2019 Document Reviewed: 04/22/2019 Elsevier Patient Education  2021 [employer]  

## 2020-12-03 NOTE — Assessment & Plan Note (Signed)
Ongoing, will continue ED medication as needed and check PSA today.

## 2020-12-04 LAB — LIPID PANEL W/O CHOL/HDL RATIO
Cholesterol, Total: 131 mg/dL (ref 100–199)
HDL: 42 mg/dL (ref >39)
LDL Chol Calc (NIH): 65 mg/dL (ref 0–99)
Triglycerides: 137 mg/dL (ref 0–149)
VLDL Cholesterol Cal: 24 mg/dL (ref 5–40)

## 2020-12-04 LAB — CBC WITH DIFFERENTIAL/PLATELET
Basophils Absolute: 0 10*3/uL (ref 0.0–0.2)
Basos: 0 %
EOS (ABSOLUTE): 0 10*3/uL (ref 0.0–0.4)
Eos: 1 %
Hematocrit: 42.9 % (ref 37.5–51.0)
Hemoglobin: 14.4 g/dL (ref 13.0–17.7)
Immature Grans (Abs): 0 10*3/uL (ref 0.0–0.1)
Immature Granulocytes: 0 %
Lymphocytes Absolute: 1.6 10*3/uL (ref 0.7–3.1)
Lymphs: 23 %
MCH: 32.8 pg (ref 26.6–33.0)
MCHC: 33.6 g/dL (ref 31.5–35.7)
MCV: 98 fL — ABNORMAL HIGH (ref 79–97)
Monocytes Absolute: 1 10*3/uL — ABNORMAL HIGH (ref 0.1–0.9)
Monocytes: 14 %
Neutrophils Absolute: 4.3 10*3/uL (ref 1.4–7.0)
Neutrophils: 62 %
Platelets: 555 10*3/uL — ABNORMAL HIGH (ref 150–450)
RBC: 4.39 x10E6/uL (ref 4.14–5.80)
RDW: 13.2 % (ref 11.6–15.4)
WBC: 7 10*3/uL (ref 3.4–10.8)

## 2020-12-04 LAB — COMPREHENSIVE METABOLIC PANEL
ALT: 18 IU/L (ref 0–44)
AST: 24 IU/L (ref 0–40)
Albumin/Globulin Ratio: 1.5 (ref 1.2–2.2)
Albumin: 4.6 g/dL (ref 3.8–4.8)
Alkaline Phosphatase: 130 IU/L — ABNORMAL HIGH (ref 44–121)
BUN/Creatinine Ratio: 13 (ref 10–24)
BUN: 12 mg/dL (ref 8–27)
Bilirubin Total: 0.3 mg/dL (ref 0.0–1.2)
CO2: 23 mmol/L (ref 20–29)
Calcium: 9.2 mg/dL (ref 8.6–10.2)
Chloride: 92 mmol/L — ABNORMAL LOW (ref 96–106)
Creatinine, Ser: 0.94 mg/dL (ref 0.76–1.27)
Globulin, Total: 3.1 g/dL (ref 1.5–4.5)
Glucose: 113 mg/dL — ABNORMAL HIGH (ref 65–99)
Potassium: 4 mmol/L (ref 3.5–5.2)
Sodium: 136 mmol/L (ref 134–144)
Total Protein: 7.7 g/dL (ref 6.0–8.5)
eGFR: 92 mL/min/{1.73_m2} (ref >59)

## 2020-12-04 LAB — PSA: Prostate Specific Ag, Serum: 2.9 ng/mL (ref 0.0–4.0)

## 2020-12-04 LAB — TSH: TSH: 1.3 u[IU]/mL (ref 0.450–4.500)

## 2020-12-04 LAB — VITAMIN B12: Vitamin B-12: >2000 pg/mL — ABNORMAL HIGH (ref 232–1245)

## 2020-12-04 NOTE — Progress Notes (Signed)
Contacted via MyChart   Good evening Derek Garza, your labs have returned: - Glucose is stable at 113 with your current medication regimen - CBC shows no anemia, but platelet count is a little elevated -- I would like you to come in 4 weeks for LAB VISIT only to recheck this, if it continues to trend up I may send you to hematology.  Platelets are what help clot the blood. - Kidney function, creatinine and eGFR, is normal.  Liver function, AST and ALT, is normal. - Remainder of labs are all stable and at goal.  Any questions? Keep being awesome!!  Thank you for allowing me to participate in your care.  I appreciate you. Kindest regards, Jolene 

## 2020-12-09 ENCOUNTER — Encounter: Payer: Self-pay | Admitting: *Deleted

## 2020-12-22 ENCOUNTER — Other Ambulatory Visit: Payer: Self-pay | Admitting: Nurse Practitioner

## 2020-12-22 NOTE — Telephone Encounter (Signed)
   Notes to clinic:  The original prescription was discontinued on 12/03/2020 by Marjie Skiff, NP for the following reason: Completed Course. Renewing this prescription may not be appropriate   Requested Prescriptions  Pending Prescriptions Disp Refills   furosemide (LASIX) 40 MG tablet [Pharmacy Med Name: FUROSEMIDE 40 MG TABLET] 30 tablet 0    Sig: TAKE 1 TABLET BY MOUTH EVERY DAY      Cardiovascular:  Diuretics - Loop Passed - 12/22/2020  8:33 AM      Passed - K in normal range and within 360 days    Potassium  Date Value Ref Range Status  12/03/2020 4.0 3.5 - 5.2 mmol/L Final  06/09/2012 3.7 3.5 - 5.1 mmol/L Final          Passed - Ca in normal range and within 360 days    Calcium  Date Value Ref Range Status  12/03/2020 9.2 8.6 - 10.2 mg/dL Final   Calcium, Total  Date Value Ref Range Status  06/09/2012 8.3 (L) 8.5 - 10.1 mg/dL Final          Passed - Na in normal range and within 360 days    Sodium  Date Value Ref Range Status  12/03/2020 136 134 - 144 mmol/L Final  06/09/2012 142 136 - 145 mmol/L Final          Passed - Cr in normal range and within 360 days    Creatinine  Date Value Ref Range Status  06/09/2012 1.02 0.60 - 1.30 mg/dL Final   Creatinine, Ser  Date Value Ref Range Status  12/03/2020 0.94 0.76 - 1.27 mg/dL Final          Passed - Last BP in normal range    BP Readings from Last 1 Encounters:  12/03/20 112/71          Passed - Valid encounter within last 6 months    Recent Outpatient Visits           2 weeks ago DM type 2 with diabetic peripheral neuropathy (HCC)   [facility] Cannady, Jolene T, NP   1 month ago Cervical myelopathy (HCC)   [facility] Cannady, Jolene T, NP   2 months ago Dizziness   [facility] Vigg, [name], MD   4 months ago Dizziness   [facility] McElwee, Lauren A, NP   4 months ago Hypertension associated with diabetes (HCC)   [facility]  Cannady, Dorie Rank, NP       Future Appointments             In 2 months Cannady, Dorie Rank, NP [facility] [facility]

## 2020-12-22 NOTE — Telephone Encounter (Signed)
Pt is scheduled 10/10 original d/c'd 7/8

## 2020-12-28 ENCOUNTER — Other Ambulatory Visit: Payer: Self-pay | Admitting: Internal Medicine

## 2021-01-06 ENCOUNTER — Other Ambulatory Visit: Payer: BLUE CROSS/BLUE SHIELD

## 2021-01-12 ENCOUNTER — Ambulatory Visit: Payer: BLUE CROSS/BLUE SHIELD | Admitting: Nurse Practitioner

## 2021-01-13 ENCOUNTER — Ambulatory Visit: Payer: BLUE CROSS/BLUE SHIELD | Admitting: Internal Medicine

## 2021-01-19 ENCOUNTER — Encounter: Payer: Self-pay | Admitting: Neurology

## 2021-01-19 ENCOUNTER — Encounter: Payer: BLUE CROSS/BLUE SHIELD | Admitting: Neurology

## 2021-01-20 ENCOUNTER — Other Ambulatory Visit: Payer: Self-pay | Admitting: Nurse Practitioner

## 2021-01-20 MED ORDER — EMPAGLIFLOZIN 25 MG PO TABS: 25.0000 mg | ORAL_TABLET | Freq: Every day | ORAL | 0 refills | Status: DC

## 2021-01-20 NOTE — Telephone Encounter (Signed)
Medication: empagliflozin (JARDIANCE) 25 MG TABS tablet 90 day supply Has the pt contacted their pharmacy? yes Preferred pharmacy: [id number] [place], [place] - [address]  Please be advised refills may take up to 3 business days.  We ask that you follow up with your pharmacy.

## 2021-02-08 ENCOUNTER — Ambulatory Visit: Payer: Self-pay

## 2021-02-08 NOTE — Telephone Encounter (Signed)
FYI for appointment tomorrow.  

## 2021-02-08 NOTE — Telephone Encounter (Signed)
  Pt. Reports he cut the top of right hand 2 days ago. Area is 1/4 inch x 1 1/2 inch. States has a small area of redness around the cut and wants it looked at.Has appointment tomorrow.   Answer Assessment - Initial Assessment Questions 1. APPEARANCE of INJURY: "What does the injury look like?"      Right hand 2. SIZE: "How large is the cut?"      Top of hand -   1 inch wide 3. BLEEDING: "Is it bleeding now?" If Yes, ask: "Is it difficult to stop?"      Not now 4. LOCATION: "Where is the injury located?"      Top of hand 5. ONSET: "How long ago did the injury occur?"      2 days ago 6. MECHANISM: "Tell me how it happened."      Reaching for phone 7. TETANUS: "When was the last tetanus booster?"     Unsure 8. PREGNANCY: "Is there any chance you are pregnant?" "When was your last menstrual period?"     N/a  Protocols used: Cuts and Lacerations-A-AH

## 2021-02-08 NOTE — Progress Notes (Signed)
BP (!) 141/75 (BP Location: Left Arm, Cuff Size: Normal)   Pulse (!) 106   Temp (!) 97.5 F (36.4 C) (Oral)   Wt 244 lb 3.2 oz (110.8 kg)   SpO2 94%   BMI 34.54 kg/m    Subjective:    Patient ID: Derek Garza, male    DOB: 1961-01-07, 60 y.o.   MRN: [id number]  HPI: Derek Garza is a 60 y.o. male  Chief Complaint  Patient presents with   Hand Injury    Pt states he got an abrasion on his R hand over this past weekend, wonders if it may be infected. UTD on tetanus.    Patient states he was trying to find his car keys and his hand ran across something sharp and caused a cut.  States it was more swollen yesterday and has swelling and redness today.  Denies any fever, sob, increased pain.  Relevant past medical, surgical, family and social history reviewed and updated as indicated. Interim medical history since our last visit reviewed. Allergies and medications reviewed and updated.  Review of Systems  Skin:        Right hand abrasion   Per HPI unless specifically indicated above     Objective:    BP (!) 141/75 (BP Location: Left Arm, Cuff Size: Normal)   Pulse (!) 106   Temp (!) 97.5 F (36.4 C) (Oral)   Wt 244 lb 3.2 oz (110.8 kg)   SpO2 94%   BMI 34.54 kg/m   Wt Readings from Last 3 Encounters:  02/09/21 244 lb 3.2 oz (110.8 kg)  12/03/20 228 lb 12.8 oz (103.8 kg)  11/01/20 235 lb 9.6 oz (106.9 kg)    Physical Exam Vitals and nursing note reviewed.  Constitutional:      General: He is not in acute distress.    Appearance: Normal appearance. He is not ill-appearing, toxic-appearing or diaphoretic.  HENT:     Head: Normocephalic.     Right Ear: External ear normal.     Left Ear: External ear normal.     Nose: Nose normal. No congestion or rhinorrhea.     Mouth/Throat:     Mouth: Mucous membranes are moist.  Eyes:     General:        Right eye: No discharge.        Left eye: No discharge.     Extraocular Movements: Extraocular movements intact.      Conjunctiva/sclera: Conjunctivae normal.     Pupils: Pupils are equal, round, and reactive to light.  Cardiovascular:     Rate and Rhythm: Normal rate and regular rhythm.     Heart sounds: No murmur heard. Pulmonary:     Effort: Pulmonary effort is normal. No respiratory distress.     Breath sounds: Normal breath sounds. No wheezing, rhonchi or rales.  Abdominal:     General: Abdomen is flat. Bowel sounds are normal.  Musculoskeletal:     Cervical back: Normal range of motion and neck supple.  Skin:    General: Skin is warm and dry.     Capillary Refill: Capillary refill takes less than 2 seconds.       Neurological:     General: No focal deficit present.     Mental Status: He is alert and oriented to person, place, and time.  Psychiatric:        Mood and Affect: Mood normal.        Behavior: Behavior normal.  Thought Content: Thought content normal.        Judgment: Judgment normal.    Results for orders placed or performed in visit on 12/03/20  Comprehensive metabolic panel  Result Value Ref Range   Glucose 113 (H) 65 - 99 mg/dL   BUN 12 8 - 27 mg/dL   Creatinine, Ser 0.94 0.76 - 1.27 mg/dL   eGFR 92 >59 mL/min/1.73   BUN/Creatinine Ratio 13 10 - 24   Sodium 136 134 - 144 mmol/L   Potassium 4.0 3.5 - 5.2 mmol/L   Chloride 92 (L) 96 - 106 mmol/L   CO2 23 20 - 29 mmol/L   Calcium 9.2 8.6 - 10.2 mg/dL   Total Protein 7.7 6.0 - 8.5 g/dL   Albumin 4.6 3.8 - 4.8 g/dL   Globulin, Total 3.1 1.5 - 4.5 g/dL   Albumin/Globulin Ratio 1.5 1.2 - 2.2   Bilirubin Total 0.3 0.0 - 1.2 mg/dL   Alkaline Phosphatase 130 (H) 44 - 121 IU/L   AST 24 0 - 40 IU/L   ALT 18 0 - 44 IU/L  CBC with Differential/Platelet  Result Value Ref Range   WBC 7.0 3.4 - 10.8 x10E3/uL   RBC 4.39 4.14 - 5.80 x10E6/uL   Hemoglobin 14.4 13.0 - 17.7 g/dL   Hematocrit 42.9 37.5 - 51.0 %   MCV 98 (H) 79 - 97 fL   MCH 32.8 26.6 - 33.0 pg   MCHC 33.6 31.5 - 35.7 g/dL   RDW 13.2 11.6 - 15.4 %    Platelets 555 (H) 150 - 450 x10E3/uL   Neutrophils 62 Not Estab. %   Lymphs 23 Not Estab. %   Monocytes 14 Not Estab. %   Eos 1 Not Estab. %   Basos 0 Not Estab. %   Neutrophils Absolute 4.3 1.4 - 7.0 x10E3/uL   Lymphocytes Absolute 1.6 0.7 - 3.1 x10E3/uL   Monocytes Absolute 1.0 (H) 0.1 - 0.9 x10E3/uL   EOS (ABSOLUTE) 0.0 0.0 - 0.4 x10E3/uL   Basophils Absolute 0.0 0.0 - 0.2 x10E3/uL   Immature Granulocytes 0 Not Estab. %   Immature Grans (Abs) 0.0 0.0 - 0.1 x10E3/uL  Lipid Panel w/o Chol/HDL Ratio  Result Value Ref Range   Cholesterol, Total 131 100 - 199 mg/dL   Triglycerides 137 0 - 149 mg/dL   HDL 42 >39 mg/dL   VLDL Cholesterol Cal 24 5 - 40 mg/dL   LDL Chol Calc (NIH) 65 0 - 99 mg/dL  TSH  Result Value Ref Range   TSH 1.300 0.450 - 4.500 uIU/mL  PSA  Result Value Ref Range   Prostate Specific Ag, Serum 2.9 0.0 - 4.0 ng/mL  Vitamin B12  Result Value Ref Range   Vitamin B-12 >2000 (H) 232 - 1245 pg/mL  Microalbumin, Urine Waived  Result Value Ref Range   Microalb, Ur Waived 10 0 - 19 mg/L   Creatinine, Urine Waived 50 10 - 300 mg/dL   Microalb/Creat Ratio 30-300 (H) <30 mg/g  Bayer DCA Hb A1c Waived  Result Value Ref Range   HB A1C (BAYER DCA - WAIVED) 6.8 <7.0 %      Assessment & Plan:   Problem List Items Addressed This Visit   None Visit Diagnoses     Abrasion of finger of right hand, initial encounter    -  Primary   Complete course of antibiotics. Reviewed s/e and benefits of medication. Discussed s/s to follow up for.        Follow  up plan: Return if symptoms worsen or fail to improve.

## 2021-02-09 ENCOUNTER — Ambulatory Visit (INDEPENDENT_AMBULATORY_CARE_PROVIDER_SITE_OTHER): Payer: BLUE CROSS/BLUE SHIELD | Admitting: Nurse Practitioner

## 2021-02-09 ENCOUNTER — Other Ambulatory Visit: Payer: Self-pay

## 2021-02-09 ENCOUNTER — Encounter: Payer: Self-pay | Admitting: Nurse Practitioner

## 2021-02-09 VITALS — BP 141/75 | HR 106 | Temp 97.5°F | Wt 244.2 lb

## 2021-02-09 DIAGNOSIS — S60419A Abrasion of unspecified finger, initial encounter: Secondary | ICD-10-CM

## 2021-02-09 MED ORDER — SULFAMETHOXAZOLE-TRIMETHOPRIM 800-160 MG PO TABS: 1.0000 | ORAL_TABLET | Freq: Two times a day (BID) | ORAL | 0 refills | Status: DC

## 2021-03-07 ENCOUNTER — Encounter: Payer: Self-pay | Admitting: Nurse Practitioner

## 2021-03-07 ENCOUNTER — Other Ambulatory Visit: Payer: Self-pay | Admitting: Internal Medicine

## 2021-03-07 ENCOUNTER — Other Ambulatory Visit: Payer: Self-pay

## 2021-03-07 ENCOUNTER — Ambulatory Visit (INDEPENDENT_AMBULATORY_CARE_PROVIDER_SITE_OTHER): Payer: Self-pay | Admitting: Nurse Practitioner

## 2021-03-07 VITALS — BP 136/82 | HR 79 | Temp 98.1°F | Wt 248.0 lb

## 2021-03-07 DIAGNOSIS — I152 Hypertension secondary to endocrine disorders: Secondary | ICD-10-CM

## 2021-03-07 DIAGNOSIS — R42 Dizziness and giddiness: Secondary | ICD-10-CM

## 2021-03-07 DIAGNOSIS — E785 Hyperlipidemia, unspecified: Secondary | ICD-10-CM

## 2021-03-07 DIAGNOSIS — G959 Disease of spinal cord, unspecified: Secondary | ICD-10-CM

## 2021-03-07 DIAGNOSIS — F3181 Bipolar II disorder: Secondary | ICD-10-CM

## 2021-03-07 DIAGNOSIS — E66812 Obesity, class 2: Secondary | ICD-10-CM

## 2021-03-07 DIAGNOSIS — E1169 Type 2 diabetes mellitus with other specified complication: Secondary | ICD-10-CM

## 2021-03-07 DIAGNOSIS — R748 Abnormal levels of other serum enzymes: Secondary | ICD-10-CM

## 2021-03-07 DIAGNOSIS — Z6835 Body mass index (BMI) 35.0-35.9, adult: Secondary | ICD-10-CM

## 2021-03-07 DIAGNOSIS — R7989 Other specified abnormal findings of blood chemistry: Secondary | ICD-10-CM

## 2021-03-07 DIAGNOSIS — E1142 Type 2 diabetes mellitus with diabetic polyneuropathy: Secondary | ICD-10-CM

## 2021-03-07 DIAGNOSIS — E1159 Type 2 diabetes mellitus with other circulatory complications: Secondary | ICD-10-CM

## 2021-03-07 LAB — BAYER DCA HB A1C WAIVED: HB A1C (BAYER DCA - WAIVED): 7.2 % — ABNORMAL HIGH (ref 4.8–5.6)

## 2021-03-07 NOTE — Assessment & Plan Note (Signed)
Continue collaboration with neurosurgery, recent fusion in June 2022.

## 2021-03-07 NOTE — Assessment & Plan Note (Signed)
Chronic, stable with A1c 6.8% last visit, will recheck today, and urine ALB 10, A:C 30-300 in July 2022.  Continue current medication regimen and adjust as needed based on A1c, concerned with recent weight gain this may effect A1c.  Continue Gabapentin for neuropathy pain and dose renally as needed.  At this time recommend he monitor BS daily and document for visits + focus on diabetic diet. Return in 3 months for follow-up.

## 2021-03-07 NOTE — Telephone Encounter (Signed)
Requested Prescriptions  Pending Prescriptions Disp Refills  . potassium chloride (KLOR-CON) 10 MEQ tablet [Pharmacy Med Name: POTASSIUM CL ER 10 MEQ TABLET] 90 tablet 1    Sig: TAKE 1 TABLET BY MOUTH EVERY DAY     Endocrinology:  Minerals - Potassium Supplementation Passed - 03/07/2021  1:28 AM      Passed - K in normal range and within 360 days    Potassium  Date Value Ref Range Status  12/03/2020 4.0 3.5 - 5.2 mmol/L Final  06/09/2012 3.7 3.5 - 5.1 mmol/L Final         Passed - Cr in normal range and within 360 days    Creatinine  Date Value Ref Range Status  06/09/2012 1.02 0.60 - 1.30 mg/dL Final   Creatinine, Ser  Date Value Ref Range Status  12/03/2020 0.94 0.76 - 1.27 mg/dL Final         Passed - Valid encounter within last 12 months    Recent Outpatient Visits          Today DM type 2 with diabetic peripheral neuropathy (HCC)   [facility] Hopkins, Dorie Rank, NP   3 weeks ago Abrasion of finger of right hand, initial encounter   [facility] Larae Grooms, NP   3 months ago DM type 2 with diabetic peripheral neuropathy (HCC)   [facility] Cannady, Corrie Dandy T, NP   4 months ago Cervical myelopathy (HCC)   [facility] Cannady, Jolene T, NP   4 months ago Dizziness   [facility] Vigg, [name], MD      Future Appointments            In 3 months Cannady, Dorie Rank, NP [employer]

## 2021-03-07 NOTE — Assessment & Plan Note (Signed)
Chronic, ongoing.  At this time continue collaboration with psychiatry and current medication regimen as prescribed by them. 

## 2021-03-07 NOTE — Patient Instructions (Signed)
Diabetes Mellitus and Nutrition, Adult When you have diabetes, or diabetes mellitus, it is very important to have healthy eating habits because your blood sugar (glucose) levels are greatly affected by what you eat and drink. Eating healthy foods in the right amounts, at about the same times every day, can help you:  Control your blood glucose.  Lower your risk of heart disease.  Improve your blood pressure.  Reach or maintain a healthy weight. What can affect my meal plan? Every person with diabetes is different, and each person has different needs for a meal plan. Your health care provider may recommend that you work with a dietitian to make a meal plan that is best for you. Your meal plan may vary depending on factors such as:  The calories you need.  The medicines you take.  Your weight.  Your blood glucose, blood pressure, and cholesterol levels.  Your activity level.  Other health conditions you have, such as heart or kidney disease. How do carbohydrates affect me? Carbohydrates, also called carbs, affect your blood glucose level more than any other type of food. Eating carbs naturally raises the amount of glucose in your blood. Carb counting is a method for keeping track of how many carbs you eat. Counting carbs is important to keep your blood glucose at a healthy level, especially if you use insulin or take certain oral diabetes medicines. It is important to know how many carbs you can safely have in each meal. This is different for every person. Your dietitian can help you calculate how many carbs you should have at each meal and for each snack. How does alcohol affect me? Alcohol can cause a sudden decrease in blood glucose (hypoglycemia), especially if you use insulin or take certain oral diabetes medicines. Hypoglycemia can be a life-threatening condition. Symptoms of hypoglycemia, such as sleepiness, dizziness, and confusion, are similar to symptoms of having too much  alcohol.  Do not drink alcohol if: ? Your health care provider tells you not to drink. ? You are pregnant, may be pregnant, or are planning to become pregnant.  If you drink alcohol: ? Do not drink on an empty stomach. ? Limit how much you use to:  0-1 drink a day for women.  0-2 drinks a day for men. ? Be aware of how much alcohol is in your drink. In the [place] one drink equals one 12 oz bottle of beer (355 mL), one 5 oz glass of wine (148 mL), or one 1 oz glass of hard liquor (44 mL). ? Keep yourself hydrated with water, diet soda, or unsweetened iced tea.  Keep in mind that regular soda, juice, and other mixers may contain a lot of sugar and must be counted as carbs. What are tips for following this plan? Reading food labels  Start by checking the serving size on the "Nutrition Facts" label of packaged foods and drinks. The amount of calories, carbs, fats, and other nutrients listed on the label is based on one serving of the item. Many items contain more than one serving per package.  Check the total grams (g) of carbs in one serving. You can calculate the number of servings of carbs in one serving by dividing the total carbs by 15. For example, if a food has 30 g of total carbs per serving, it would be equal to 2 servings of carbs.  Check the number of grams (g) of saturated fats and trans fats in one serving. Choose foods that have   a low amount or none of these fats.  Check the number of milligrams (mg) of salt (sodium) in one serving. Most people should limit total sodium intake to less than 2,300 mg per day.  Always check the nutrition information of foods labeled as "low-fat" or "nonfat." These foods may be higher in added sugar or refined carbs and should be avoided.  Talk to your dietitian to identify your daily goals for nutrients listed on the label. Shopping  Avoid buying canned, pre-made, or processed foods. These foods tend to be high in fat, sodium, and added  sugar.  Shop around the outside edge of the grocery store. This is where you will most often find fresh fruits and vegetables, bulk grains, fresh meats, and fresh dairy. Cooking  Use low-heat cooking methods, such as baking, instead of high-heat cooking methods like deep frying.  Cook using healthy oils, such as olive, canola, or sunflower oil.  Avoid cooking with butter, cream, or high-fat meats. Meal planning  Eat meals and snacks regularly, preferably at the same times every day. Avoid going long periods of time without eating.  Eat foods that are high in fiber, such as fresh fruits, vegetables, beans, and whole grains. Talk with your dietitian about how many servings of carbs you can eat at each meal.  Eat 4-6 oz (112-168 g) of lean protein each day, such as lean meat, chicken, fish, eggs, or tofu. One ounce (oz) of lean protein is equal to: ? 1 oz (28 g) of meat, chicken, or fish. ? 1 egg. ?  cup (62 g) of tofu.  Eat some foods each day that contain healthy fats, such as avocado, nuts, seeds, and fish.   What foods should I eat? Fruits Berries. Apples. Oranges. Peaches. Apricots. Plums. Grapes. Mango. Papaya. Pomegranate. Kiwi. Cherries. Vegetables Lettuce. Spinach. Leafy greens, including kale, chard, collard greens, and mustard greens. Beets. Cauliflower. Cabbage. Broccoli. Carrots. Green beans. Tomatoes. Peppers. Onions. Cucumbers. Brussels sprouts. Grains Whole grains, such as whole-wheat or whole-grain bread, crackers, tortillas, cereal, and pasta. Unsweetened oatmeal. Quinoa. Brown or wild rice. Meats and other proteins Seafood. Poultry without skin. Lean cuts of poultry and beef. Tofu. Nuts. Seeds. Dairy Low-fat or fat-free dairy products such as milk, yogurt, and cheese. The items listed above may not be a complete list of foods and beverages you can eat. Contact a dietitian for more information. What foods should I avoid? Fruits Fruits canned with  syrup. Vegetables Canned vegetables. Frozen vegetables with butter or cream sauce. Grains Refined white flour and flour products such as bread, pasta, snack foods, and cereals. Avoid all processed foods. Meats and other proteins Fatty cuts of meat. Poultry with skin. Breaded or fried meats. Processed meat. Avoid saturated fats. Dairy Full-fat yogurt, cheese, or milk. Beverages Sweetened drinks, such as soda or iced tea. The items listed above may not be a complete list of foods and beverages you should avoid. Contact a dietitian for more information. Questions to ask a health care provider  Do I need to meet with a diabetes educator?  Do I need to meet with a dietitian?  What number can I call if I have questions?  When are the best times to check my blood glucose? Where to find more information:  American Diabetes Association: diabetes.org  Academy of Nutrition and Dietetics: www.eatright.org  National Institute of Diabetes and Digestive and Kidney Diseases: www.niddk.nih.gov  Association of Diabetes Care and Education Specialists: www.diabeteseducator.org Summary  It is important to have healthy eating   habits because your blood sugar (glucose) levels are greatly affected by what you eat and drink.  A healthy meal plan will help you control your blood glucose and maintain a healthy lifestyle.  Your health care provider may recommend that you work with a dietitian to make a meal plan that is best for you.  Keep in mind that carbohydrates (carbs) and alcohol have immediate effects on your blood glucose levels. It is important to count carbs and to use alcohol carefully. This information is not intended to replace advice given to you by your health care provider. Make sure you discuss any questions you have with your health care provider. Document Revised: 04/22/2019 Document Reviewed: 04/22/2019 Elsevier Patient Education  2021 [employer]  

## 2021-03-07 NOTE — Assessment & Plan Note (Signed)
BMI 35.08 -- concerned with his 20 pound weight loss.  Recommended eating smaller high protein, low fat meals more frequently and exercising 30 mins a day 5 times a week with a goal of 10-15lb weight loss in the next 3 months. Patient voiced their understanding and motivation to adhere to these recommendations.

## 2021-03-07 NOTE — Assessment & Plan Note (Signed)
Chronic, ongoing.  BP initially above goal today and repeat closer to goal.  Continue current medications and adjust as needed.  Recommend he monitor BP at least a few mornings a week at home and document for provider visits.  DASH diet at home.  Labs: CMP.  Return in  3 months.

## 2021-03-07 NOTE — Assessment & Plan Note (Signed)
Continue collaboration with neurosurgery.

## 2021-03-07 NOTE — Assessment & Plan Note (Signed)
Chronic, ongoing.  Continue current medication regimen and adjust as needed.  Lipid panel up to date and at goal. 

## 2021-03-07 NOTE — Progress Notes (Signed)
BP 136/82 (BP Location: Left Arm, Patient Position: Sitting, Cuff Size: Normal)   Pulse 79   Temp 98.1 F (36.7 C) (Oral)   Wt 248 lb (112.5 kg)   SpO2 97%   BMI 35.08 kg/m    Subjective:    Patient ID: Derek Garza, male    DOB: Sep 12, 1960, 60 y.o.   MRN: [id number]  HPI: Derek Garza is a 60 y.o. male  Chief Complaint  Patient presents with   Diabetes   Hyperlipidemia   Hypertension    Patient states he has noticed at times he stops and checks it, that he notices his BP readings are high.    Mood   Dizziness    Patient states he recently had surgery to repair disc and states he still has the dizziness.    DIABETES Last visit in May A1c 6.8%.  Continues as Metformin 500 MG BID and Jardiance 25 MG QDAY.  Taking Gabapentin 600 MG BID for neuropathy discomfort.  Has gained 20 pounds since July.  Currently without work. Hypoglycemic episodes:no Polydipsia/polyuria: no Visual disturbance: no Chest pain: no Paresthesias: no Glucose Monitoring: yes             Accucheck frequency: not checking             Fasting glucose:              Post prandial:             Evening:             Before meals: Taking Insulin?: no             Long acting insulin:             Short acting insulin: Blood Pressure Monitoring: not checking Retinal Examination: Up to Date Foot Exam: Up to Date Pneumovax: Up to Date Influenza: Up to Date Aspirin: no    HYPERTENSION / HYPERLIPIDEMIA Continues on Benazepril 40 MG daily, Lasix 80 MG daily, K+, and Atorvastatin 10 MG daily.  Fused C 3, 4, 5 on June 20th -- for cervical myelopathy -- continues to have some ongoing dizziness, has nerve conduction testing coming up. Satisfied with current treatment? yes Duration of hypertension: chronic BP monitoring frequency: occasional BP range: BP medication side effects: no Duration of hyperlipidemia: chronic Cholesterol medication side effects: no Cholesterol supplements: none Medication  compliance: good compliance Aspirin: no Recent stressors: no Recurrent headaches: no Visual changes: no Palpitations: no Dyspnea: no Chest pain: no Lower extremity edema: no Dizzy/lightheaded: occasional   DEPRESSION Currently taking Seroquel and Tegretol for mood -- which he is aware can cause weight gain. Mood status: stable Satisfied with current treatment?: yes Symptom severity: moderate  Duration of current treatment : chronic Side effects: no Medication compliance: good compliance Previous psychiatric medications: multiple Depressed mood: no Anxious mood: no Anhedonia: no Significant weight loss or gain: no Insomnia: none Fatigue: no Feelings of worthlessness or guilt: no Impaired concentration/indecisiveness: no Suicidal ideations: no Hopelessness: no Crying spells: no Depression screen [facility] 2/9 03/07/2021 12/03/2020 10/13/2020 06/08/2020 11/18/2019  Decreased Interest 0 0 0 0 0  Down, Depressed, Hopeless 0 - 0 0 0  PHQ - 2 Score 0 0 0 0 0  Altered sleeping 0 - - 0 0  Tired, decreased energy 0 - - 0 0  Change in appetite 0 - - 0 0  Feeling bad or failure about yourself  0 - - 0 0  Trouble concentrating 0 - - 0 0  Moving slowly or fidgety/restless 0 - - 0 0  Suicidal thoughts 0 - - 0 0  PHQ-9 Score 0 - - 0 0  Difficult doing work/chores Not difficult at all - - - Not difficult at all     Relevant past medical, surgical, family and social history reviewed and updated as indicated. Interim medical history since our last visit reviewed. Allergies and medications reviewed and updated.  Review of Systems  Constitutional:  Negative for activity change, diaphoresis, fatigue and fever.  Respiratory:  Negative for cough, chest tightness, shortness of breath and wheezing.   Cardiovascular:  Negative for chest pain, palpitations and leg swelling.  Gastrointestinal: Negative.   Endocrine: Negative for cold intolerance, heat intolerance, polydipsia, polyphagia and polyuria.   Neurological:  Positive for dizziness. Negative for syncope, weakness, light-headedness, numbness and headaches.  Psychiatric/Behavioral: Negative.     Per HPI unless specifically indicated above     Objective:    BP 136/82 (BP Location: Left Arm, Patient Position: Sitting, Cuff Size: Normal)   Pulse 79   Temp 98.1 F (36.7 C) (Oral)   Wt 248 lb (112.5 kg)   SpO2 97%   BMI 35.08 kg/m   Wt Readings from Last 3 Encounters:  03/07/21 248 lb (112.5 kg)  02/09/21 244 lb 3.2 oz (110.8 kg)  12/03/20 228 lb 12.8 oz (103.8 kg)    Physical Exam Vitals and nursing note reviewed.  Constitutional:      General: He is awake. He is not in acute distress.    Appearance: He is well-developed and well-groomed. He is obese. He is not ill-appearing or toxic-appearing.  HENT:     Head: Normocephalic and atraumatic.     Right Ear: Hearing and external ear normal. No drainage.     Left Ear: Hearing and external ear normal. No drainage.  Eyes:     General: Lids are normal.        Right eye: No discharge.        Left eye: No discharge.     Conjunctiva/sclera: Conjunctivae normal.     Pupils: Pupils are equal, round, and reactive to light.  Neck:     Thyroid: No thyromegaly.     Vascular: No carotid bruit.     Trachea: Trachea normal.  Cardiovascular:     Rate and Rhythm: Normal rate and regular rhythm.     Heart sounds: Normal heart sounds, S1 normal and S2 normal. No murmur heard.   No gallop.  Pulmonary:     Effort: Pulmonary effort is normal. No accessory muscle usage or respiratory distress.     Breath sounds: Normal breath sounds.  Abdominal:     General: Bowel sounds are normal.     Palpations: Abdomen is soft. There is no hepatomegaly or splenomegaly.  Musculoskeletal:        General: Normal range of motion.     Cervical back: Normal range of motion and neck supple.     Right lower leg: No edema.     Left lower leg: No edema.  Lymphadenopathy:     Cervical: No cervical  adenopathy.  Skin:    General: Skin is warm and dry.     Capillary Refill: Capillary refill takes less than 2 seconds.     Findings: No rash.  Neurological:     Mental Status: He is alert and oriented to person, place, and time.     Cranial Nerves: Cranial nerves are intact.     Coordination: Coordination is intact.  Gait: Gait is intact.     Deep Tendon Reflexes: Reflexes are normal and symmetric.     Reflex Scores:      Brachioradialis reflexes are 2+ on the right side and 2+ on the left side.      Patellar reflexes are 2+ on the right side and 2+ on the left side. Psychiatric:        Attention and Perception: Attention normal.        Mood and Affect: Mood normal.        Speech: Speech normal.        Behavior: Behavior normal. Behavior is cooperative.        Thought Content: Thought content normal.    Results for orders placed or performed in visit on 12/03/20  Comprehensive metabolic panel  Result Value Ref Range   Glucose 113 (H) 65 - 99 mg/dL   BUN 12 8 - 27 mg/dL   Creatinine, Ser 0.94 0.76 - 1.27 mg/dL   eGFR 92 >59 mL/min/1.73   BUN/Creatinine Ratio 13 10 - 24   Sodium 136 134 - 144 mmol/L   Potassium 4.0 3.5 - 5.2 mmol/L   Chloride 92 (L) 96 - 106 mmol/L   CO2 23 20 - 29 mmol/L   Calcium 9.2 8.6 - 10.2 mg/dL   Total Protein 7.7 6.0 - 8.5 g/dL   Albumin 4.6 3.8 - 4.8 g/dL   Globulin, Total 3.1 1.5 - 4.5 g/dL   Albumin/Globulin Ratio 1.5 1.2 - 2.2   Bilirubin Total 0.3 0.0 - 1.2 mg/dL   Alkaline Phosphatase 130 (H) 44 - 121 IU/L   AST 24 0 - 40 IU/L   ALT 18 0 - 44 IU/L  CBC with Differential/Platelet  Result Value Ref Range   WBC 7.0 3.4 - 10.8 x10E3/uL   RBC 4.39 4.14 - 5.80 x10E6/uL   Hemoglobin 14.4 13.0 - 17.7 g/dL   Hematocrit 42.9 37.5 - 51.0 %   MCV 98 (H) 79 - 97 fL   MCH 32.8 26.6 - 33.0 pg   MCHC 33.6 31.5 - 35.7 g/dL   RDW 13.2 11.6 - 15.4 %   Platelets 555 (H) 150 - 450 x10E3/uL   Neutrophils 62 Not Estab. %   Lymphs 23 Not Estab. %    Monocytes 14 Not Estab. %   Eos 1 Not Estab. %   Basos 0 Not Estab. %   Neutrophils Absolute 4.3 1.4 - 7.0 x10E3/uL   Lymphocytes Absolute 1.6 0.7 - 3.1 x10E3/uL   Monocytes Absolute 1.0 (H) 0.1 - 0.9 x10E3/uL   EOS (ABSOLUTE) 0.0 0.0 - 0.4 x10E3/uL   Basophils Absolute 0.0 0.0 - 0.2 x10E3/uL   Immature Granulocytes 0 Not Estab. %   Immature Grans (Abs) 0.0 0.0 - 0.1 x10E3/uL  Lipid Panel w/o Chol/HDL Ratio  Result Value Ref Range   Cholesterol, Total 131 100 - 199 mg/dL   Triglycerides 137 0 - 149 mg/dL   HDL 42 >39 mg/dL   VLDL Cholesterol Cal 24 5 - 40 mg/dL   LDL Chol Calc (NIH) 65 0 - 99 mg/dL  TSH  Result Value Ref Range   TSH 1.300 0.450 - 4.500 uIU/mL  PSA  Result Value Ref Range   Prostate Specific Ag, Serum 2.9 0.0 - 4.0 ng/mL  Vitamin B12  Result Value Ref Range   Vitamin B-12 >2000 (H) 232 - 1245 pg/mL  Microalbumin, Urine Waived  Result Value Ref Range   Microalb, Ur Waived 10 0 - 19 mg/L  Creatinine, Urine Waived 50 10 - 300 mg/dL   Microalb/Creat Ratio 30-300 (H) <30 mg/g  Bayer DCA Hb A1c Waived  Result Value Ref Range   HB A1C (BAYER DCA - WAIVED) 6.8 <7.0 %      Assessment & Plan:   Problem List Items Addressed This Visit       Cardiovascular and Mediastinum   Hypertension associated with diabetes ([place])    Chronic, ongoing.  BP initially above goal today and repeat closer to goal.  Continue current medications and adjust as needed.  Recommend he monitor BP at least a few mornings a week at home and document for provider visits.  DASH diet at home.  Labs: CMP.  Return in  3 months.      Relevant Orders   Bayer DCA Hb A1c Waived   Comprehensive metabolic panel   CBC with Differential/Platelet     Endocrine   DM type 2 with diabetic peripheral neuropathy ([place]) - Primary    Chronic, stable with A1c 6.8% last visit, will recheck today, and urine ALB 10, A:C 30-300 in July 2022.  Continue current medication regimen and adjust as needed based on A1c,  concerned with recent weight gain this may effect A1c.  Continue Gabapentin for neuropathy pain and dose renally as needed.  At this time recommend he monitor BS daily and document for visits + focus on diabetic diet. Return in 3 months for follow-up.      Relevant Medications   cyclobenzaprine (FLEXERIL) 10 MG tablet   Other Relevant Orders   Bayer DCA Hb A1c Waived   Hyperlipidemia associated with type 2 diabetes mellitus (HCC)    Chronic, ongoing.  Continue current medication regimen and adjust as needed.  Lipid panel up to date and at goal.      Relevant Orders   Bayer DCA Hb A1c Waived     Nervous and Auditory   Cervical myelopathy ([place])    Continue collaboration with neurosurgery, recent fusion in June 2022.        Other   Bipolar 2 disorder (HCC)    Chronic, ongoing.  At this time continue collaboration with psychiatry and current medication regimen as prescribed by them.      Dizziness    Continue collaboration with neurosurgery.      Obesity    BMI 35.08 -- concerned with his 20 pound weight loss.  Recommended eating smaller high protein, low fat meals more frequently and exercising 30 mins a day 5 times a week with a goal of 10-15lb weight loss in the next 3 months. Patient voiced their understanding and motivation to adhere to these recommendations.       Other Visit Diagnoses     Elevated alkaline phosphatase level       Check CMP and GGT today.  Noted on recent labs, he thinks he does not have a gall bladder.   Relevant Orders   Comprehensive metabolic panel   Gamma GT   Elevated platelet count       Recheck CBC today, last checked post-op in June.   Relevant Orders   CBC with Differential/Platelet        Follow up plan: Return in about 3 months (around 06/07/2021) for T2DM, HTN/HLD, MOOD.

## 2021-03-08 LAB — CBC WITH DIFFERENTIAL/PLATELET
Basophils Absolute: 0.1 10*3/uL (ref 0.0–0.2)
Basos: 1 %
EOS (ABSOLUTE): 0.1 10*3/uL (ref 0.0–0.4)
Eos: 1 %
Hematocrit: 41.8 % (ref 37.5–51.0)
Hemoglobin: 14 g/dL (ref 13.0–17.7)
Immature Grans (Abs): 0 10*3/uL (ref 0.0–0.1)
Immature Granulocytes: 1 %
Lymphocytes Absolute: 1.6 10*3/uL (ref 0.7–3.1)
Lymphs: 31 %
MCH: 31 pg (ref 26.6–33.0)
MCHC: 33.5 g/dL (ref 31.5–35.7)
MCV: 93 fL (ref 79–97)
Monocytes Absolute: 0.7 10*3/uL (ref 0.1–0.9)
Monocytes: 14 %
Neutrophils Absolute: 2.8 10*3/uL (ref 1.4–7.0)
Neutrophils: 52 %
Platelets: 293 10*3/uL (ref 150–450)
RBC: 4.51 x10E6/uL (ref 4.14–5.80)
RDW: 12.4 % (ref 11.6–15.4)
WBC: 5.3 10*3/uL (ref 3.4–10.8)

## 2021-03-08 LAB — COMPREHENSIVE METABOLIC PANEL
ALT: 19 IU/L (ref 0–44)
AST: 19 IU/L (ref 0–40)
Albumin/Globulin Ratio: 1.6 (ref 1.2–2.2)
Albumin: 4.2 g/dL (ref 3.8–4.8)
Alkaline Phosphatase: 98 IU/L (ref 44–121)
BUN/Creatinine Ratio: 14 (ref 10–24)
BUN: 12 mg/dL (ref 8–27)
Bilirubin Total: <0.2 mg/dL (ref 0.0–1.2)
CO2: 21 mmol/L (ref 20–29)
Calcium: 8.9 mg/dL (ref 8.6–10.2)
Chloride: 103 mmol/L (ref 96–106)
Creatinine, Ser: 0.88 mg/dL (ref 0.76–1.27)
Globulin, Total: 2.6 g/dL (ref 1.5–4.5)
Glucose: 121 mg/dL — ABNORMAL HIGH (ref 70–99)
Potassium: 4.5 mmol/L (ref 3.5–5.2)
Sodium: 137 mmol/L (ref 134–144)
Total Protein: 6.8 g/dL (ref 6.0–8.5)
eGFR: 98 mL/min/{1.73_m2} (ref >59)

## 2021-03-08 LAB — GAMMA GT: GGT: 31 IU/L (ref 0–65)

## 2021-03-08 NOTE — Progress Notes (Signed)
Contacted via MyChart   Good evening Kj, your labs have returned: - Kidney function, creatinine and eGFR, is normal, as is liver function, AST and ALT. - CBC shows no anemia - GGT is normal, as is alkaline phosphatase -- great news!!  Any questions? Keep being amazing!!  Thank you for allowing me to participate in your care.  I appreciate you. Kindest regards, Yuritza Paulhus

## 2021-03-23 ENCOUNTER — Ambulatory Visit: Payer: Self-pay | Admitting: Neurology

## 2021-03-23 ENCOUNTER — Other Ambulatory Visit: Payer: Self-pay

## 2021-03-23 ENCOUNTER — Ambulatory Visit (INDEPENDENT_AMBULATORY_CARE_PROVIDER_SITE_OTHER): Payer: Self-pay | Admitting: Neurology

## 2021-03-23 DIAGNOSIS — R269 Unspecified abnormalities of gait and mobility: Secondary | ICD-10-CM

## 2021-03-23 DIAGNOSIS — G959 Disease of spinal cord, unspecified: Secondary | ICD-10-CM

## 2021-03-23 DIAGNOSIS — M4302 Spondylolysis, cervical region: Secondary | ICD-10-CM

## 2021-03-23 DIAGNOSIS — R202 Paresthesia of skin: Secondary | ICD-10-CM

## 2021-03-23 NOTE — Procedures (Signed)
Full Name: Derek Garza Gender: Male MRN #: [id number] Date of Birth: 25-Apr-1961    Visit Date: 03/23/2021 07:25 Age: 60 Years Examining Physician: Levert Feinstein, MD  Referring Physician: Levert Feinstein, MD Height: 5 feet 10 inch Patient History: 42lbs History: 60 year old male, with history of diabetes, presenting with gait abnormality bilateral lower extremity paresthesia,  Summary of the test: Nerve conduction study: Bilateral sural, superficial peroneal sensory responses were normal.  Bilateral peroneal to EDB motor responses were normal.  Bilateral tibial motor responses showed moderate to severely decreased CMAP amplitude, low normal range conduction velocity.  Electromyography: Selected needle examination of bilateral lower extremity muscles and bilateral lumbosacral paraspinal muscles were performed.  There is evidence of chronic neuropathic changes involving bilateral L4-5 S1 myotomes, with no evidence of active denervation at bilateral lower lumbar paraspinal muscles.    Conclusion: This is an abnormal study.  There is electrodiagnostic evidence of chronic mild bilateral lumbosacral radiculopathy, mainly involving bilateral L4-5 S1 nerve roots.  There is no evidence of large fiber peripheral neuropathy.  Above findings could not rule out a possibility of small fiber neuropathy    ------------------------------- Levert Feinstein, M.D. PhD  [facility] [address] [place], [place] [address] Tel: [phone] Fax: [phone]  Verbal informed consent was obtained from the patient, patient was informed of potential risk of procedure, including bruising, bleeding, hematoma formation, infection, muscle weakness, muscle pain, numbness, among others.        MNC    Nerve / Sites Muscle Latency Ref. Amplitude Ref. Rel Amp Segments Distance Velocity Ref. Area    ms ms mV mV %  cm m/s m/s mVms  R Peroneal - EDB     Ankle EDB 4.8 ?6.5 3.6 ?2.0 100  Ankle - EDB 9   16.2     Fib head EDB 11.4  2.8  78.5 Fib head - Ankle 29 44 ?44 14.3     Pop fossa EDB 13.7  2.8  98.9 Pop fossa - Fib head 10 44 ?44 14.6         Pop fossa - Ankle      L Peroneal - EDB     Ankle EDB 4.8 ?6.5 4.9 ?2.0 100 Ankle - EDB 9   16.4     Fib head EDB 11.3  4.4  90.8 Fib head - Ankle 29 44 ?44 15.7     Pop fossa EDB 13.5  4.5  101 Pop fossa - Fib head 10 44 ?44 16.6         Pop fossa - Ankle      R Tibial - AH     Ankle AH 4.9 ?5.8 0.6 ?4.0 100 Ankle - AH 9   2.9     Pop fossa AH 15.1  0.4  74.6 Pop fossa - Ankle 42 41 ?41 2.8  L Tibial - AH     Ankle AH 4.3 ?5.8 1.5 ?4.0 100 Ankle - AH 9   7.5     Pop fossa AH 14.2  1.2  76.5 Pop fossa - Ankle 41 41 ?41 6.8             SNC    Nerve / Sites Rec. Site Peak Lat Ref.  Amp Ref. Segments Distance    ms ms V V  cm  R Sural - Ankle (Calf)     Calf Ankle 4.4 ?4.4 6 ?6 Calf - Ankle 14  L Sural - Ankle (  Calf)     Calf Ankle 3.6 ?4.4 6 ?6 Calf - Ankle 14  R Superficial peroneal - Ankle     Lat leg Ankle 4.1 ?4.4 6 ?6 Lat leg - Ankle 14  L Superficial peroneal - Ankle     Lat leg Ankle 3.8 ?4.4 6 ?6 Lat leg - Ankle 14             F  Wave    Nerve F Lat Ref.   ms ms  R Tibial - AH 62.4 ?56.0  L Tibial - AH 65.8 ?56.0         EMG Summary Table    Spontaneous MUAP Recruitment  Muscle IA Fib PSW Fasc Other Amp Dur. Poly Pattern  R. Tibialis anterior Increased None None None _______ Normal Normal Normal Reduced  R. Tibialis posterior Normal None None None _______ Normal Normal Normal Reduced  R. Peroneus longus Normal None None None _______ Normal Normal Normal Reduced  R. Gastrocnemius (Medial head) Normal None None None _______ Normal Normal Normal Reduced  R. Vastus lateralis Normal None None None _______ Normal Normal Normal Reduced  L. Tibialis anterior Increased 1+ None None _______ Normal Normal Normal Reduced  L. Tibialis posterior Normal None None None _______ Normal Normal Normal Reduced  L. Peroneus  longus Normal None None None _______ Normal Normal Normal Reduced  L. Gastrocnemius (Medial head) Normal None None None _______ Normal Normal Normal Reduced  L. Vastus lateralis Normal None None None _______ Normal Normal Normal Reduced  R. Lumbar paraspinals (low) Increased 1+ 1+ None _______ Normal Normal Normal Normal  R. Lumbar paraspinals (mid) Increased 1+ 1+ None _______ Normal Normal Normal Normal  L. Lumbar paraspinals (low) Increased 1+ 1+ None _______ Normal Normal Normal Normal  L. Lumbar paraspinals (mid) Increased 1+ 1+ None _______ Normal Normal Normal Normal

## 2021-03-23 NOTE — Progress Notes (Signed)
No chief complaint on file.     ASSESSMENT AND PLAN  Derek Garza is a 60 y.o. male  Cervical myelopathy  MRI in May 2022 showed severe canal stenosis C5-6, moderate stenosis C4-5, severe bilateral foraminal stenosis C3-4,  Status post ACDF C3-4-5 by Dr. Danielle Garza in June 2022,  EMG nerve conduction study today showed no large fiber peripheral neuropathy, evidence of mild chronic bilateral lumbosacral radiculopathy, could not rule out the possibility of small fiber neuropathy due to diabetes,  Orthostatic dizziness,  Most related to his baseline tachycardia, orthostatic hypotension with worsening tachycardia, hypoperfusion of the brain,  Emphasized importance of increased water intake, moderate exercise, tight control of diabetes,  DIAGNOSTIC DATA (LABS, IMAGING, TESTING) - I reviewed patient records, labs, notes, testing and imaging myself where available. Laboratory evaluation March 2022: Normal vitamin B12, TSH, Lipid panel, LDL 91, triglycerides 248, BMP showed elevated glucose 145, creatinine of 1.04, A1c of six 5.6  I of the brain with without contrast on 07/30/2020: No acute abnormality, mild supratentorium small vessel disease  CT cervical spine on November 15, 2018, multilevel degenerative changes, worst at C4-7   HISTORICAL  Derek Garza, XR 60 year old male, seen in request by his primary care nurse practitioner Derek Garza and Dr. Charlotta Garza, Derek Garza, for evaluation of tendency to fall, initial evaluation was on September 24, 2020.  I reviewed and summarized the referring note.  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, he continually had recurrent similar episode of sudden overwhelming weakness from head to toes, if he does not steady himself he will fall, eventually went out on short-term disability since June 04, 2020  He uses dizziness to describe his symptoms, on further questioning, he denies vertigo, almost all episodes happen in a standing position, he had sudden onset overwhelming weakness starting from his head traveling to his whole body, if he does not hold onto something, steady himself he would fall to the ground, this episode lasted about couple minutes, he denies loss of consciousness,  At baseline, he denies significant gait change, denies neck pain, denies bowel bladder incontinence,  Reviewed emergency room record, he presented on November 16, 2018 for fall CT cervical spine reviewed with patient, severe multilevel cervical spondylosis, worst at C4 7, disc osteophyte protruding into canal, causing variable degree of canal stenosis  Update March 23, 2021:  He had MRI of cervical spine on Oct 07, 2020, personally reviewed the film, which showed evidence of multilevel degenerative changes, C5-6, severe spinal stenosis with severe bilateral foraminal stenosis, myelomalacia within the spinal cord, moderate spinal stenosis C4-5, severe bilateral foraminal stenosis, C3-4 with severe foraminal stenosis  He underwent anterior ACDF C3-4-5 per patient by Dr. Danielle Garza in June 2020, recovering well, continue have some stiff neck, but decrease the pain, continue has mild gait abnormality, lower extremity paresthesia, intermittent low back pain, denies bowel  and bladder incontinence,  In  addition, he complains of frequent dizziness, especially when he get up quickly from sitting position  Blood pressure/heart rate today, sitting down 134/81, 102; getting up 107/67, 116;, standing up from 1 minutes 104/62, 111  He is on long term disability, because his frequent dizziness, unsteady gait,  Reviewed laboratory evaluation in 2022: Normal B12, PSA, TSH, lipid panel, CBC, hemoglobin 14.4, CMP, creatinine of 0.94, A1c 6.8,  EMG nerve conduction study March 23, 2021 showed no large fiber peripheral neuropathy, chronic mild bilateral lumbosacral radiculopathy, no evidence of active process.  Above findings could not rule out possibility of small fiber neuropathy   REVIEW OF SYSTEMS:  Full 14 system review of systems performed and notable only for as above All other review of systems were negative.  PHYSICAL EXAM: Blood pressure sitting down 134/81, heart rate 102; standing up 107/67, heart rate 116; standing up for 1 minutes 104/62 heart rate of 111    There is no height or weight on file to calculate BMI.  PHYSICAL EXAMNIATION:  Gen: NAD, conversant, well nourised, well groomed           NEUROLOGICAL EXAM:  MENTAL STATUS: Speech/cognition: Awake, alert, oriented to history taking and casual conversation   CRANIAL NERVES: CN II: Visual fields are full to confrontation. Pupils are round equal and briskly reactive to light. CN III, IV, VI: extraocular movement are normal. No ptosis. CN V: Facial sensation is intact to light touch CN VII: Face is symmetric with normal eye closure  CN VIII: Hearing is normal to causal conversation. CN IX, X: Phonation is normal. CN XI: Head turning and shoulder shrug are intact  MOTOR: There is no pronator drift of out-stretched arms. Muscle bulk and tone are normal. Muscle strength is normal.  REFLEXES: Reflexes are 2+ and symmetric at the biceps, triceps, 3/3 knees, and absent at ankles. Plantar responses are  flexor.  SENSORY: Absent vibratory sensation at toes, length dependent decreased light touch, pinprick to mid shin level.  COORDINATION:  There is no trunk or limb dysmetria noted.  GAIT/STANCE: He can get up from seated position arm crossed, tendency to pointing toes outwards, wide-based, mildly unsteady, difficulty stand on tiptoe, and heels, difficulty performing tandem walking  ALLERGIES: Allergies  Allergen Reactions   Acetaminophen    Percocet [Oxycodone-Acetaminophen] Nausea Only    HOME MEDICATIONS: Current Outpatient Medications  Medication Sig Dispense Refill   atorvastatin (LIPITOR) 10 MG tablet Take 1 tablet (10 mg total) by mouth daily. 90 tablet 1   benazepril (LOTENSIN) 40 MG tablet TAKE 1 TABLET BY MOUTH EVERY DAY 90 tablet 0   carbamazepine (TEGRETOL XR) 200 MG 12 hr tablet 200 mg 2 (two) times daily.     cyclobenzaprine (FLEXERIL) 10 MG tablet take 1 tablet by oral route 3 times a day as needed for mscle spasm     empagliflozin (JARDIANCE) 25 MG TABS tablet Take 1 tablet (25 mg total) by mouth daily. 90 tablet 0   furosemide (LASIX) 80 MG tablet Take 80 mg by mouth daily.     gabapentin (NEURONTIN) 600 MG tablet Take 1 tablet (600 mg total) by mouth 2 (two) times daily. 180 tablet 1   HYDROcodone-acetaminophen (NORCO/VICODIN) 5-325 MG tablet Take 1 tablet by mouth every 8 (eight) hours as needed.     meclizine (ANTIVERT) 25 MG tablet Take 1 tablet (25 mg total) by mouth 3 (three) times daily as needed for dizziness. 30 tablet 0   metFORMIN (GLUCOPHAGE)  500 MG tablet Take 1 tablet (500 mg total) by mouth 2 (two) times daily with a meal. 60 tablet 0   potassium chloride (KLOR-CON) 10 MEQ tablet TAKE 1 TABLET BY MOUTH EVERY DAY 90 tablet 1   QUEtiapine (SEROQUEL) 400 MG tablet Take 400 mg by mouth daily.      sulfamethoxazole-trimethoprim (BACTRIM DS) 800-160 MG tablet Take 1 tablet by mouth 2 (two) times daily. 14 tablet 0   tadalafil (CIALIS) 20 MG tablet Take 1  tablet (20 mg total) by mouth daily as needed for erectile dysfunction. 100 tablet 0   [START ON 06/08/2021] Zoster Vaccine Adjuvanted [facility]) injection Inject 0.5 mLs into the muscle once for 1 dose. Dose #2 0.5 mL 0   No current facility-administered medications for this visit.    PAST MEDICAL HISTORY: Past Medical History:  Diagnosis Date   Diabetes mellitus without complication (HCC)    Hypertension     PAST SURGICAL HISTORY: Past Surgical History:  Procedure Laterality Date   ANKLE SURGERY  05/29/1977   APPENDECTOMY  05/29/2009   CERVICAL SPINE SURGERY     HERNIA REPAIR  [phone]   SPINE SURGERY  10/2018   VASECTOMY      FAMILY HISTORY: Family History  Problem Relation Age of Onset   Breast cancer Sister     SOCIAL HISTORY: Social History   Socioeconomic History   Marital status: Divorced    Spouse name: Not on file   Number of children: Not on file   Years of education: Not on file   Highest education level: Not on file  Occupational History   Occupation: on [employer]  Tobacco Use   Smoking status: Never   Smokeless tobacco: Never  Vaping Use   Vaping Use: Never used  Substance and Sexual Activity   Alcohol use: Yes    Comment: on occasion   Drug use: No   Sexual activity: Not on file  Other Topics Concern   Not on file  Social History Narrative   Lives alone   Right Handed   Drinks 2 liter of soda daily   Social Determinants of Health   Financial Resource Strain: Not on file  Food Insecurity: Not on file  Transportation Needs: Not on file  Physical Activity: Not on file  Stress: Not on file  Social Connections: Not on file  Intimate Partner Violence: Not on file      Levert Feinstein, M.D. Ph.D.  [facility] [address] [place], [place] [address] Ph: [phone] Fax: [phone]  CC:  Marjie Skiff, NP [address] [place],  [place] [address]  Derek Garza T, NP   Addendum: Neurosurgical evaluation by Dr.  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

## 2021-03-29 ENCOUNTER — Other Ambulatory Visit: Payer: Self-pay | Admitting: Internal Medicine

## 2021-03-29 NOTE — Telephone Encounter (Signed)
Early RF request- Rx 10/09/20 #180 1RF- not due Requested Prescriptions  Pending Prescriptions Disp Refills  . gabapentin (NEURONTIN) 600 MG tablet [Pharmacy Med Name: GABAPENTIN 600 MG TABLET] 180 tablet 1    Sig: TAKE 1 TABLET BY MOUTH TWICE A DAY     Neurology: Anticonvulsants - gabapentin Passed - 03/29/2021  2:47 PM      Passed - Valid encounter within last 12 months    Recent Outpatient Visits          3 weeks ago DM type 2 with diabetic peripheral neuropathy (HCC)   [facility] [place], Corrie Dandy T, NP   1 month ago Abrasion of finger of right hand, initial encounter   [facility] Larae Grooms, NP   3 months ago DM type 2 with diabetic peripheral neuropathy (HCC)   [facility] Cannady, Dorie Rank, NP   4 months ago Cervical myelopathy (HCC)   [facility] Cannady, Jolene T, NP   5 months ago Dizziness   [facility] Vigg, [name], MD      Future Appointments            In 2 months Cannady, Dorie Rank, NP [employer] [facility], [facility]

## 2021-03-30 NOTE — Telephone Encounter (Signed)
Fyi.  Please advise if needed.

## 2021-04-07 ENCOUNTER — Telehealth: Payer: Self-pay | Admitting: Neurology

## 2021-04-07 DIAGNOSIS — R202 Paresthesia of skin: Secondary | ICD-10-CM

## 2021-04-07 NOTE — Telephone Encounter (Signed)
Pt now has insurance and would like to go ahead and be put on schedule for the MRI's and all other test Dr Terrace Arabia wanted done

## 2021-04-13 NOTE — Telephone Encounter (Signed)
I spoke to the patient. Says he and Dr. Terrace Arabia spoke about further testing after his NCV/EMG. However, he was unable to move forward due to being without insurance for one month. He now has coverage with a new plan. He will send a copy of this card (front and back) through [employer]. He is aware we will get clarification on which tests Dr. Terrace Arabia would like to order.

## 2021-04-13 NOTE — Telephone Encounter (Signed)
Left message for a return call

## 2021-04-13 NOTE — Telephone Encounter (Signed)
Attempted to call pt, LVM for call back  °

## 2021-04-13 NOTE — Telephone Encounter (Signed)
Reviewed last office visit March 23, 2021, patient had cervical myelopathy, status post ACDF C3-4-5 in June 2022, but continue to have dizziness, unsteady gait, EMG in October showed no large fiber peripheral neuropathy  But at office visit, he has clear orthostatic blood pressure change and tachycardia  Please asked patient how is he doing symptomatic wise  He does have diabetes, A1c was 7.2, will often do more extensive blood test to rule out other possible causes of small fiber neuropathy, if you wish to proceed, I will put the lab order in the system, he can come to our office without appointment, to have laboratory testing first, then he can be put up on my schedule again in 1 to 72-month

## 2021-04-13 NOTE — Telephone Encounter (Signed)
I do not see an MRI that has been ordered.

## 2021-04-14 ENCOUNTER — Other Ambulatory Visit: Payer: Self-pay | Admitting: Nurse Practitioner

## 2021-04-14 ENCOUNTER — Other Ambulatory Visit: Payer: Self-pay | Admitting: Family Medicine

## 2021-04-14 ENCOUNTER — Other Ambulatory Visit: Payer: Self-pay | Admitting: Internal Medicine

## 2021-04-14 NOTE — Telephone Encounter (Signed)
Requested Prescriptions  Pending Prescriptions Disp Refills  . JARDIANCE 25 MG TABS tablet [Pharmacy Med Name: JARDIANCE 25 MG TABLET] 90 tablet 0    Sig: TAKE 1 TABLET (25 MG TOTAL) BY MOUTH DAILY.     Endocrinology:  Diabetes - SGLT2 Inhibitors Passed - 04/14/2021 10:19 AM      Passed - Cr in normal range and within 360 days    Creatinine  Date Value Ref Range Status  06/09/2012 1.02 0.60 - 1.30 mg/dL Final   Creatinine, Ser  Date Value Ref Range Status  03/07/2021 0.88 0.76 - 1.27 mg/dL Final         Passed - LDL in normal range and within 360 days    LDL Chol Calc (NIH)  Date Value Ref Range Status  12/03/2020 65 0 - 99 mg/dL Final         Passed - HBA1C is between 0 and 7.9 and within 180 days    Hemoglobin A1C  Date Value Ref Range Status  01/10/2016 7.3  Final   HB A1C (BAYER DCA - WAIVED)  Date Value Ref Range Status  03/07/2021 7.2 (H) 4.8 - 5.6 % Final    Comment:             Prediabetes: 5.7 - 6.4          Diabetes: >6.4          Glycemic control for adults with diabetes: <7.0               **Please note reference interval change**          Passed - eGFR in normal range and within 360 days    EGFR (African American)  Date Value Ref Range Status  06/09/2012 >60  Final   GFR calc Af Amer  Date Value Ref Range Status  06/29/2020 90 >59 mL/min/1.73 Final    Comment:    **In accordance with recommendations from the NKF-ASN Task force,**   [facility] is in the process of updating its eGFR calculation to the   2021 CKD-EPI creatinine equation that estimates kidney function   without a race variable.    EGFR (Non-African Amer.)  Date Value Ref Range Status  06/09/2012 >60  Final    Comment:    eGFR values <73m/min/1.73 m2 may be an indication of chronic kidney disease (CKD). Calculated eGFR is useful in patients with stable renal function. The eGFR calculation will not be reliable in acutely ill patients when serum creatinine is changing rapidly. It is  not useful in  patients on dialysis. The eGFR calculation may not be applicable to patients at the low and high extremes of body sizes, pregnant women, and vegetarians.    GFR calc non Af Amer  Date Value Ref Range Status  06/29/2020 78 >59 mL/min/1.73 Final   eGFR  Date Value Ref Range Status  03/07/2021 98 >59 mL/min/1.73 Final         Passed - Valid encounter within last 6 months    Recent Outpatient Visits          1 month ago DM type 2 with diabetic peripheral neuropathy ([place]   2 months ago Abrasion of finger of right hand, [facility] [place] [facility]   4 months ago DM type 2 with diabetic peripheral neuropathy ([place]   [place]ol[place], [place]   5 months ago [place]ical myel[place], Henrine Screws T, NP   6 months ago Dizziness   [place], MD      Future Appointments            In 1 month Cannady, Barbaraann Faster, NP [employer], [facility]

## 2021-04-14 NOTE — Telephone Encounter (Signed)
Orders Placed This Encounter  Procedures   RPR   C-reactive protein   TSH   Sedimentation rate   Ferritin   Copper, serum   ANA w/Reflex if Positive   Multiple Myeloma Panel (SPEP&IFE w/QIG)

## 2021-04-14 NOTE — Telephone Encounter (Signed)
Requested Prescriptions  Pending Prescriptions Disp Refills  . gabapentin (NEURONTIN) 600 MG tablet [Pharmacy Med Name: GABAPENTIN 600 MG TABLET] 180 tablet 1    Sig: TAKE 1 TABLET BY MOUTH TWICE A DAY     Neurology: Anticonvulsants - gabapentin Passed - 04/14/2021 10:19 AM      Passed - Valid encounter within last 12 months    Recent Outpatient Visits          1 month ago DM type 2 with diabetic peripheral neuropathy (HCC)   [facility] Sorrel, Stanton T, NP   2 months ago Abrasion of finger of right hand, initial encounter   [facility] Larae Grooms, NP   4 months ago DM type 2 with diabetic peripheral neuropathy (HCC)   [facility] Cannady, Dorie Rank, NP   5 months ago Cervical myelopathy (HCC)   [facility] Cannady, Corrie Dandy T, NP   6 months ago Dizziness   [facility] Vigg, [name], MD      Future Appointments            In 1 month Cannady, Dorie Rank, NP [employer] [facility], [facility]

## 2021-04-14 NOTE — Telephone Encounter (Signed)
Requested medication (s) are due for refill today: Yes  Requested medication (s) are on the active medication list: Yes  Last refill:  unsure  Future visit scheduled: Yes  Notes to clinic:  rx not assigned to a provider, historical med. Please advise     Requested Prescriptions  Pending Prescriptions Disp Refills   furosemide (LASIX) 80 MG tablet [Pharmacy Med Name: FUROSEMIDE 80 MG TABLET] 45 tablet 1    Sig: TAKE 0.5 TABLETS BY MOUTH DAILY.     Cardiovascular:  Diuretics - Loop Passed - 04/14/2021 10:19 AM      Passed - K in normal range and within 360 days    Potassium  Date Value Ref Range Status  03/07/2021 4.5 3.5 - 5.2 mmol/L Final  06/09/2012 3.7 3.5 - 5.1 mmol/L Final          Passed - Ca in normal range and within 360 days    Calcium  Date Value Ref Range Status  03/07/2021 8.9 8.6 - 10.2 mg/dL Final   Calcium, Total  Date Value Ref Range Status  06/09/2012 8.3 (L) 8.5 - 10.1 mg/dL Final          Passed - Na in normal range and within 360 days    Sodium  Date Value Ref Range Status  03/07/2021 137 134 - 144 mmol/L Final  06/09/2012 142 136 - 145 mmol/L Final          Passed - Cr in normal range and within 360 days    Creatinine  Date Value Ref Range Status  06/09/2012 1.02 0.60 - 1.30 mg/dL Final   Creatinine, Ser  Date Value Ref Range Status  03/07/2021 0.88 0.76 - 1.27 mg/dL Final          Passed - Last BP in normal range    BP Readings from Last 1 Encounters:  03/07/21 136/82          Passed - Valid encounter within last 6 months    Recent Outpatient Visits           1 month ago DM type 2 with diabetic peripheral neuropathy (HCC)   [facility] [place], Corrie Dandy T, NP   2 months ago Abrasion of finger of right hand, initial encounter   [facility] Larae Grooms, NP   4 months ago DM type 2 with diabetic peripheral neuropathy (HCC)   [facility] Cannady, Dorie Rank, NP   5 months ago Cervical  myelopathy (HCC)   [facility] Cannady, Dorie Rank, NP   6 months ago Dizziness   [facility] Vigg, [name], MD       Future Appointments             In 1 month Cannady, Dorie Rank, NP [employer] [facility], [facility]

## 2021-04-14 NOTE — Telephone Encounter (Signed)
I spoke to the patient. He is agreeable to further lab work. He will come next week (aware of our altered hours). Scheduled follow up on 06/26/2021.

## 2021-04-14 NOTE — Addendum Note (Signed)
Addended by: Levert Feinstein on: 04/14/2021 06:23 PM   Modules accepted: Orders

## 2021-05-02 ENCOUNTER — Other Ambulatory Visit: Payer: Self-pay | Admitting: Internal Medicine

## 2021-05-02 NOTE — Telephone Encounter (Signed)
Requested Prescriptions  Pending Prescriptions Disp Refills  . atorvastatin (LIPITOR) 10 MG tablet [Pharmacy Med Name: ATORVASTATIN 10 MG TABLET] 90 tablet 1    Sig: TAKE 1 TABLET BY MOUTH EVERY DAY     Cardiovascular:  Antilipid - Statins Passed - 05/02/2021  6:30 AM      Passed - Total Cholesterol in normal range and within 360 days    Cholesterol, Total  Date Value Ref Range Status  12/03/2020 131 100 - 199 mg/dL Final   Cholesterol Piccolo, Waived  Date Value Ref Range Status  05/14/2018 160 <200 mg/dL Final    Comment:                            Desirable                <200                         Borderline High      200- 239                         High                     >239          Passed - LDL in normal range and within 360 days    LDL Chol Calc (NIH)  Date Value Ref Range Status  12/03/2020 65 0 - 99 mg/dL Final         Passed - HDL in normal range and within 360 days    HDL  Date Value Ref Range Status  12/03/2020 42 >39 mg/dL Final         Passed - Triglycerides in normal range and within 360 days    Triglycerides  Date Value Ref Range Status  12/03/2020 137 0 - 149 mg/dL Final   Triglycerides Piccolo,Waived  Date Value Ref Range Status  05/14/2018 182 (H) <150 mg/dL Final    Comment:                            Normal                   <150                         Borderline High     150 - 199                         High                200 - 499                         Very High                >499          Passed - Patient is not pregnant      Passed - Valid encounter within last 12 months    Recent Outpatient Visits          1 month ago DM type 2 with diabetic peripheral neuropathy (HCC)   [facility] Bethesda, Jolene T, NP   2 months ago Abrasion of finger of right hand, initial  encounter   [facility] Larae Grooms, NP   5 months ago DM type 2 with diabetic peripheral neuropathy (HCC)   [facility] Cannady, Dorie Rank, NP   6 months ago Cervical myelopathy (HCC)   [facility] Cannady, Dorie Rank, NP   6 months ago Dizziness   [facility] Vigg, [name], MD      Future Appointments            In 1 month Cannady, Dorie Rank, NP [facility]

## 2021-05-19 ENCOUNTER — Other Ambulatory Visit: Payer: Self-pay | Admitting: Nurse Practitioner

## 2021-05-19 MED ORDER — METFORMIN HCL 500 MG PO TABS: 500.0000 mg | ORAL_TABLET | Freq: Two times a day (BID) | ORAL | 4 refills | Status: DC

## 2021-05-19 NOTE — Telephone Encounter (Signed)
Requested medication (s) are due for refill today:   Not sure  It's from 09/01/2020 #60, 0 refills   Requested medication (s) are on the active medication list:   Yes  Future visit scheduled:   Yes in 2 wks with Jolene   Last ordered: 09/01/2020 #60, 0 refills  Returned because it doesn't look like he has been taking this.   Requested Prescriptions  Pending Prescriptions Disp Refills   metFORMIN (GLUCOPHAGE) 500 MG tablet 60 tablet 0    Sig: Take 1 tablet (500 mg total) by mouth 2 (two) times daily with a meal.     Endocrinology:  Diabetes - Biguanides Passed - 05/19/2021 11:26 AM      Passed - Cr in normal range and within 360 days    Creatinine  Date Value Ref Range Status  06/09/2012 1.02 0.60 - 1.30 mg/dL Final   Creatinine, Ser  Date Value Ref Range Status  03/07/2021 0.88 0.76 - 1.27 mg/dL Final          Passed - HBA1C is between 0 and 7.9 and within 180 days    Hemoglobin A1C  Date Value Ref Range Status  01/10/2016 7.3  Final   HB A1C (BAYER DCA - WAIVED)  Date Value Ref Range Status  03/07/2021 7.2 (H) 4.8 - 5.6 % Final    Comment:             Prediabetes: 5.7 - 6.4          Diabetes: >6.4          Glycemic control for adults with diabetes: <7.0               **Please note reference interval change**           Passed - eGFR in normal range and within 360 days    EGFR (African American)  Date Value Ref Range Status  06/09/2012 >60  Final   GFR calc Af Amer  Date Value Ref Range Status  06/29/2020 90 >59 mL/min/1.73 Final    Comment:    **In accordance with recommendations from the NKF-ASN Task force,**   [facility] is in the process of updating its eGFR calculation to the   2021 CKD-EPI creatinine equation that estimates kidney function   without a race variable.    EGFR (Non-African Amer.)  Date Value Ref Range Status  06/09/2012 >60  Final    Comment:    eGFR values <72m/min/1.73 m2 may be an indication of chronic kidney disease (CKD). Calculated  eGFR is useful in patients with stable renal function. The eGFR calculation will not be reliable in acutely ill patients when serum creatinine is changing rapidly. It is not useful in  patients on dialysis. The eGFR calculation may not be applicable to patients at the low and high extremes of body sizes, pregnant women, and vegetarians.    GFR calc non Af Amer  Date Value Ref Range Status  06/29/2020 78 >59 mL/min/1.73 Final   eGFR  Date Value Ref Range Status  03/07/2021 98 >59 mL/min/1.73 Final          Passed - Valid encounter within last 6 months    Recent Outpatient Visits           2 months ago DM type 2 with diabetic peripheral neuropathy ([facility]   [facility] Scre[facility]   3 months ago Abrasion of finger of right hand, [facility]  5 months ago DM type 2 with diabetic peripheral neuropathy ([place])   [place], Henrine Screws T, NP   6 months ago Cervical myelopathy ([place])   [place] Cannady, Barbaraann Faster, NP   7 months ago Dizziness   [place], MD       Future Appointments             In 2 weeks Cannady, Barbaraann Faster, NP [employer] [employer], [facility]

## 2021-05-19 NOTE — Telephone Encounter (Signed)
Medication Refill - Medication: MetFORMIN   Has the patient contacted their pharmacy? Yes.    (Agent: If yes, when and what did the pharmacy advise?) Pharmacy told pt to contact pcp  Preferred Pharmacy (with phone number or street name):  [id number] - [place], [place] - [address]  [address] [place] [address]  Phone:  [phone]  Fax:  [phone]  Has the patient been seen for an appointment in the last year OR does the patient have an upcoming appointment? Yes.

## 2021-05-27 ENCOUNTER — Ambulatory Visit: Payer: Self-pay | Admitting: Nurse Practitioner

## 2021-06-07 ENCOUNTER — Ambulatory Visit: Payer: BLUE CROSS/BLUE SHIELD | Admitting: Nurse Practitioner

## 2021-06-13 ENCOUNTER — Telehealth: Payer: Self-pay | Admitting: Neurology

## 2021-06-13 NOTE — Telephone Encounter (Signed)
Pt's current insurance information [employer]  Member ID: [id number] Group#: [id number] BIN: [id number]  Have to cancel appt because have not met my deductible.

## 2021-06-16 ENCOUNTER — Ambulatory Visit: Payer: Self-pay | Admitting: Neurology

## 2021-06-24 ENCOUNTER — Ambulatory Visit: Payer: Self-pay | Admitting: Nurse Practitioner

## 2021-08-08 ENCOUNTER — Telehealth: Payer: Self-pay

## 2021-08-08 NOTE — Telephone Encounter (Signed)
Left vm to confirm 08/10/21 appointment-Toni ?

## 2021-08-09 ENCOUNTER — Ambulatory Visit: Payer: BLUE CROSS/BLUE SHIELD | Admitting: Nurse Practitioner

## 2021-08-10 ENCOUNTER — Ambulatory Visit: Payer: BLUE CROSS/BLUE SHIELD | Admitting: Nurse Practitioner

## 2021-08-10 ENCOUNTER — Other Ambulatory Visit: Payer: Self-pay

## 2021-08-10 MED ORDER — EMPAGLIFLOZIN 25 MG PO TABS: 25.0000 mg | ORAL_TABLET | Freq: Every day | ORAL | 0 refills | Status: DC

## 2021-08-10 NOTE — Telephone Encounter (Signed)
Spoke with patient and patient is scheduled for 08/24/21 at 10:20 am with Jolene. Please advise? ?

## 2021-08-21 NOTE — Patient Instructions (Addendum)
Ensure to follow-up with Dr. Terrace Arabia, have ordered all labs she wanted today. ? ?Diabetes Mellitus and Nutrition, Adult ?When you have diabetes, or diabetes mellitus, it is very important to have healthy eating habits because your blood sugar (glucose) levels are greatly affected by what you eat and drink. Eating healthy foods in the right amounts, at about the same times every day, can help you: ?Manage your blood glucose. ?Lower your risk of heart disease. ?Improve your blood pressure. ?Reach or maintain a healthy weight. ?What can affect my meal plan? ?Every person with diabetes is different, and each person has different needs for a meal plan. Your health care provider may recommend that you work with a dietitian to make a meal plan that is best for you. Your meal plan may vary depending on factors such as: ?The calories you need. ?The medicines you take. ?Your weight. ?Your blood glucose, blood pressure, and cholesterol levels. ?Your activity level. ?Other health conditions you have, such as heart or kidney disease. ?How do carbohydrates affect me? ?Carbohydrates, also called carbs, affect your blood glucose level more than any other type of food. Eating carbs raises the amount of glucose in your blood. ?It is important to know how many carbs you can safely have in each meal. This is different for every person. Your dietitian can help you calculate how many carbs you should have at each meal and for each snack. ?How does alcohol affect me? ?Alcohol can cause a decrease in blood glucose (hypoglycemia), especially if you use insulin or take certain diabetes medicines by mouth. Hypoglycemia can be a life-threatening condition. Symptoms of hypoglycemia, such as sleepiness, dizziness, and confusion, are similar to symptoms of having too much alcohol. ?Do not drink alcohol if: ?Your health care provider tells you not to drink. ?You are pregnant, may be pregnant, or are planning to become pregnant. ?If you drink  alcohol: ?Limit how much you have to: ?0-1 drink a day for women. ?0-2 drinks a day for men. ?Know how much alcohol is in your drink. In the [place] one drink equals one 12 oz bottle of beer (355 mL), one 5 oz glass of wine (148 mL), or one 1? oz glass of hard liquor (44 mL). ?Keep yourself hydrated with water, diet soda, or unsweetened iced tea. Keep in mind that regular soda, juice, and other mixers may contain a lot of sugar and must be counted as carbs. ?What are tips for following this plan? ?Reading food labels ?Start by checking the serving size on the Nutrition Facts label of packaged foods and drinks. The number of calories and the amount of carbs, fats, and other nutrients listed on the label are based on one serving of the item. Many items contain more than one serving per package. ?Check the total grams (g) of carbs in one serving. ?Check the number of grams of saturated fats and trans fats in one serving. Choose foods that have a low amount or none of these fats. ?Check the number of milligrams (mg) of salt (sodium) in one serving. Most people should limit total sodium intake to less than 2,300 mg per day. ?Always check the nutrition information of foods labeled as "low-fat" or "nonfat." These foods may be higher in added sugar or refined carbs and should be avoided. ?Talk to your dietitian to identify your daily goals for nutrients listed on the label. ?Shopping ?Avoid buying canned, pre-made, or processed foods. These foods tend to be high in fat, sodium, and added sugar. ?  Shop around the outside edge of the grocery store. This is where you will most often find fresh fruits and vegetables, bulk grains, fresh meats, and fresh dairy products. ?Cooking ?Use low-heat cooking methods, such as baking, instead of high-heat cooking methods, such as deep frying. ?Cook using healthy oils, such as olive, canola, or sunflower oil. ?Avoid cooking with butter, cream, or high-fat meats. ?Meal planning ?Eat meals and  snacks regularly, preferably at the same times every day. Avoid going long periods of time without eating. ?Eat foods that are high in fiber, such as fresh fruits, vegetables, beans, and whole grains. ?Eat 4-6 oz (112-168 g) of lean protein each day, such as lean meat, chicken, fish, eggs, or tofu. One ounce (oz) (28 g) of lean protein is equal to: ?1 oz (28 g) of meat, chicken, or fish. ?1 egg. ?? cup (62 g) of tofu. ?Eat some foods each day that contain healthy fats, such as avocado, nuts, seeds, and fish. ?What foods should I eat? ?Fruits ?Berries. Apples. Oranges. Peaches. Apricots. Plums. Grapes. Mangoes. Papayas. Pomegranates. Kiwi. Cherries. ?Vegetables ?Leafy greens, including lettuce, spinach, kale, chard, collard greens, mustard greens, and cabbage. Beets. Cauliflower. Broccoli. Carrots. Green beans. Tomatoes. Peppers. Onions. Cucumbers. Brussels sprouts. ?Grains ?Whole grains, such as whole-wheat or whole-grain bread, crackers, tortillas, cereal, and pasta. Unsweetened oatmeal. Quinoa. Brown or wild rice. ?Meats and other proteins ?Seafood. Poultry without skin. Lean cuts of poultry and beef. Tofu. Nuts. Seeds. ?Dairy ?Low-fat or fat-free dairy products such as milk, yogurt, and cheese. ?The items listed above may not be a complete list of foods and beverages you can eat and drink. Contact a dietitian for more information. ?What foods should I avoid? ?Fruits ?Fruits canned with syrup. ?Vegetables ?Canned vegetables. Frozen vegetables with butter or cream sauce. ?Grains ?Refined white flour and flour products such as bread, pasta, snack foods, and cereals. Avoid all processed foods. ?Meats and other proteins ?Fatty cuts of meat. Poultry with skin. Breaded or fried meats. Processed meat. Avoid saturated fats. ?Dairy ?Full-fat yogurt, cheese, or milk. ?Beverages ?Sweetened drinks, such as soda or iced tea. ?The items listed above may not be a complete list of foods and beverages you should avoid. Contact a  dietitian for more information. ?Questions to ask a health care provider ?Do I need to meet with a certified diabetes care and education specialist? ?Do I need to meet with a dietitian? ?What number can I call if I have questions? ?When are the best times to check my blood glucose? ?Where to find more information: ?American Diabetes Association: diabetes.org ?Academy of Nutrition and Dietetics: eatright.org ?[employer] of Diabetes and Digestive and Kidney Diseases: StageSync.si ?Association of Diabetes Care & Education Specialists: diabeteseducator.org ?Summary ?It is important to have healthy eating habits because your blood sugar (glucose) levels are greatly affected by what you eat and drink. It is important to use alcohol carefully. ?A healthy meal plan will help you manage your blood glucose and lower your risk of heart disease. ?Your health care provider may recommend that you work with a dietitian to make a meal plan that is best for you. ?This information is not intended to replace advice given to you by your health care provider. Make sure you discuss any questions you have with your health care provider. ?Document Revised: 12/17/2019 Document Reviewed: 12/17/2019 ?Elsevier Patient Education ? 2022 Elsevier Inc. ? ?

## 2021-08-24 ENCOUNTER — Encounter: Payer: Self-pay | Admitting: Nurse Practitioner

## 2021-08-24 ENCOUNTER — Other Ambulatory Visit: Payer: Self-pay

## 2021-08-24 ENCOUNTER — Ambulatory Visit (INDEPENDENT_AMBULATORY_CARE_PROVIDER_SITE_OTHER): Payer: BLUE CROSS/BLUE SHIELD | Admitting: Nurse Practitioner

## 2021-08-24 VITALS — BP 100/66 | HR 68 | Temp 97.3°F | Wt 215.2 lb

## 2021-08-24 DIAGNOSIS — F3181 Bipolar II disorder: Secondary | ICD-10-CM

## 2021-08-24 DIAGNOSIS — E6609 Other obesity due to excess calories: Secondary | ICD-10-CM

## 2021-08-24 DIAGNOSIS — E785 Hyperlipidemia, unspecified: Secondary | ICD-10-CM

## 2021-08-24 DIAGNOSIS — E1159 Type 2 diabetes mellitus with other circulatory complications: Secondary | ICD-10-CM

## 2021-08-24 DIAGNOSIS — I152 Hypertension secondary to endocrine disorders: Secondary | ICD-10-CM

## 2021-08-24 DIAGNOSIS — E66811 Other obesity due to excess calories: Secondary | ICD-10-CM

## 2021-08-24 DIAGNOSIS — E1169 Type 2 diabetes mellitus with other specified complication: Secondary | ICD-10-CM | POA: Diagnosis not present

## 2021-08-24 DIAGNOSIS — Z6830 Body mass index (BMI) 30.0-30.9, adult: Secondary | ICD-10-CM

## 2021-08-24 DIAGNOSIS — G959 Disease of spinal cord, unspecified: Secondary | ICD-10-CM

## 2021-08-24 DIAGNOSIS — E1142 Type 2 diabetes mellitus with diabetic polyneuropathy: Secondary | ICD-10-CM

## 2021-08-24 DIAGNOSIS — R269 Unspecified abnormalities of gait and mobility: Secondary | ICD-10-CM

## 2021-08-24 DIAGNOSIS — R42 Dizziness and giddiness: Secondary | ICD-10-CM

## 2021-08-24 LAB — MICROALBUMIN, URINE WAIVED
Creatinine, Urine Waived: 10 mg/dL (ref 10–300)
Microalb, Ur Waived: 10 mg/L (ref 0–19)
Microalb/Creat Ratio: <30 mg/g (ref <30)

## 2021-08-24 LAB — BAYER DCA HB A1C WAIVED: HB A1C (BAYER DCA - WAIVED): 7.1 % — ABNORMAL HIGH (ref 4.8–5.6)

## 2021-08-24 MED ORDER — POTASSIUM CHLORIDE ER 10 MEQ PO TBCR: 10.0000 meq | EXTENDED_RELEASE_TABLET | Freq: Every day | ORAL | 4 refills | Status: DC

## 2021-08-24 MED ORDER — GABAPENTIN 600 MG PO TABS: 600.0000 mg | ORAL_TABLET | Freq: Two times a day (BID) | ORAL | 4 refills | Status: DC

## 2021-08-24 MED ORDER — BENAZEPRIL HCL 40 MG PO TABS: 40.0000 mg | ORAL_TABLET | Freq: Every day | ORAL | 4 refills | Status: DC

## 2021-08-24 MED ORDER — EMPAGLIFLOZIN 25 MG PO TABS: 25.0000 mg | ORAL_TABLET | Freq: Every day | ORAL | 4 refills | Status: DC

## 2021-08-24 MED ORDER — ATORVASTATIN CALCIUM 10 MG PO TABS: 10.0000 mg | ORAL_TABLET | Freq: Every day | ORAL | 4 refills | Status: DC

## 2021-08-24 MED ORDER — METFORMIN HCL 500 MG PO TABS: 500.0000 mg | ORAL_TABLET | Freq: Two times a day (BID) | ORAL | 4 refills | Status: DC

## 2021-08-24 MED ORDER — FUROSEMIDE 80 MG PO TABS: ORAL_TABLET | ORAL | 4 refills | Status: DC

## 2021-08-24 NOTE — Progress Notes (Signed)
? ?BP 100/66   Pulse 68   Temp (!) 97.3 ?F (36.3 ?C) (Oral)   Wt 215 lb 3.2 oz (97.6 kg)   SpO2 97%   BMI 30.44 kg/m?   ? ?Subjective:  ? ? Patient ID: Derek Garza, male    DOB: 08-19-1960, 61 y.o.   MRN: [id number] ? ?HPI: ?Derek Garza is a 61 y.o. male ? ?Chief Complaint  ?Patient presents with  ? Depression  ? Diabetes  ? Hyperlipidemia  ? Hypertension  ? ?DIABETES ?Last visit in October A1c 7.2%.  Continues as Metformin 500 MG BID and Jardiance 25 MG QDAY.  Taking Gabapentin 600 MG BID for neuropathy discomfort.  Currently without work -- working on disability.  Has worked on weight and lost 30 pounds. ?Hypoglycemic episodes:no ?Polydipsia/polyuria: no ?Visual disturbance: no ?Chest pain: no ?Paresthesias: no ?Glucose Monitoring: yes ?            Accucheck frequency: not checking ?            Fasting glucose: 118 this morning ?            Post prandial: ?            Evening: ?            Before meals: ?Taking Insulin?: no ?            Long acting insulin: ?            Short acting insulin: ?Blood Pressure Monitoring: not checking ?Retinal Examination: Up to Date -- needs to schedule, [facility] ?Foot Exam: Up to Date ?Pneumovax: Up to Date ?Influenza: Up to Date ?Aspirin: no  ?  ?HYPERTENSION / HYPERLIPIDEMIA ?Continues on Benazepril 40 MG daily, Lasix 80 MG daily, K+, and Atorvastatin 10 MG daily. ? ?Fused C 3, 4, 5 on June 20th, 2022 -- for cervical myelopathy -- continues to be followed by neurology in [place], recent labs ordered by them but he has not obtained.   ?Satisfied with current treatment? yes ?Duration of hypertension: chronic ?BP monitoring frequency: occasional ?BP range: ?BP medication side effects: no ?Duration of hyperlipidemia: chronic ?Cholesterol medication side effects: no ?Cholesterol supplements: none ?Medication compliance: good compliance ?Aspirin: no ?Recent stressors: no ?Recurrent headaches: no ?Visual changes: no ?Palpitations: no ?Dyspnea: no ?Chest pain: no ?Lower  extremity edema: no ?Dizzy/lightheaded: occasional -- followed by neurology ?  ?DEPRESSION ?Currently taking Seroquel and Tegretol for mood.  Follows with Dr. Delaney Meigs in [place]. ?Mood status: stable ?Satisfied with current treatment?: yes ?Symptom severity: moderate  ?Duration of current treatment : chronic ?Side effects: no ?Medication compliance: good compliance ?Previous psychiatric medications: multiple ?Depressed mood: no ?Anxious mood: occasional ?Anhedonia: no ?Significant weight loss or gain: no ?Insomnia: none ?Fatigue: no ?Feelings of worthlessness or guilt: no ?Impaired concentration/indecisiveness: no ?Suicidal ideations: no ?Hopelessness: no ?Crying spells: no ? ?  08/24/2021  ? 10:36 AM 03/07/2021  ?  8:33 AM 12/03/2020  ?  8:32 AM 10/13/2020  ?  9:16 AM 06/08/2020  ?  4:33 PM  ?Depression screen PHQ 2/9  ?Decreased Interest 0 0 0 0 0  ?Down, Depressed, Hopeless 0 0  0 0  ?PHQ - 2 Score 0 0 0 0 0  ?Altered sleeping 1 0   0  ?Tired, decreased energy 0 0   0  ?Change in appetite 0 0   0  ?Feeling bad or failure about yourself  0 0   0  ?Trouble concentrating 0 0   0  ?  Moving slowly or fidgety/restless 0 0   0  ?Suicidal thoughts 0 0   0  ?PHQ-9 Score 1 0   0  ?Difficult doing work/chores Not difficult at all Not difficult at all     ?  ? ?Relevant past medical, surgical, family and social history reviewed and updated as indicated. Interim medical history since our last visit reviewed. ?Allergies and medications reviewed and updated. ? ?Review of Systems  ?Constitutional:  Negative for activity change, diaphoresis, fatigue and fever.  ?Respiratory:  Negative for cough, chest tightness, shortness of breath and wheezing.   ?Cardiovascular:  Negative for chest pain, palpitations and leg swelling.  ?Gastrointestinal: Negative.   ?Endocrine: Negative for cold intolerance, heat intolerance, polydipsia, polyphagia and polyuria.  ?Neurological:  Positive for dizziness. Negative for syncope, weakness, light-headedness,  numbness and headaches.  ?Psychiatric/Behavioral: Negative.    ? ?Per HPI unless specifically indicated above ? ?   ?Objective:  ?  ?BP 100/66   Pulse 68   Temp (!) 97.3 ?F (36.3 ?C) (Oral)   Wt 215 lb 3.2 oz (97.6 kg)   SpO2 97%   BMI 30.44 kg/m?   ?Wt Readings from Last 3 Encounters:  ?08/24/21 215 lb 3.2 oz (97.6 kg)  ?03/07/21 248 lb (112.5 kg)  ?02/09/21 244 lb 3.2 oz (110.8 kg)  ?  ?Physical Exam ?Vitals and nursing note reviewed.  ?Constitutional:   ?   General: He is awake. He is not in acute distress. ?   Appearance: He is well-developed and well-groomed. He is obese. He is not ill-appearing or toxic-appearing.  ?HENT:  ?   Head: Normocephalic and atraumatic.  ?   Right Ear: Hearing and external ear normal. No drainage.  ?   Left Ear: Hearing and external ear normal. No drainage.  ?Eyes:  ?   General: Lids are normal.     ?   Right eye: No discharge.     ?   Left eye: No discharge.  ?   Conjunctiva/sclera: Conjunctivae normal.  ?   Pupils: Pupils are equal, round, and reactive to light.  ?Neck:  ?   Thyroid: No thyromegaly.  ?   Vascular: No carotid bruit.  ?   Trachea: Trachea normal.  ?Cardiovascular:  ?   Rate and Rhythm: Normal rate and regular rhythm.  ?   Heart sounds: Normal heart sounds, S1 normal and S2 normal. No murmur heard. ?  No gallop.  ?Pulmonary:  ?   Effort: Pulmonary effort is normal. No accessory muscle usage or respiratory distress.  ?   Breath sounds: Normal breath sounds.  ?Abdominal:  ?   General: Bowel sounds are normal.  ?   Palpations: Abdomen is soft. There is no hepatomegaly or splenomegaly.  ?Musculoskeletal:     ?   General: Normal range of motion.  ?   Cervical back: Normal range of motion and neck supple.  ?   Right lower leg: No edema.  ?   Left lower leg: No edema.  ?Lymphadenopathy:  ?   Cervical: No cervical adenopathy.  ?Skin: ?   General: Skin is warm and dry.  ?   Capillary Refill: Capillary refill takes less than 2 seconds.  ?   Findings: No rash.   ?Neurological:  ?   Mental Status: He is alert and oriented to person, place, and time.  ?   Coordination: Coordination is intact.  ?   Gait: Gait is intact.  ?   Deep Tendon Reflexes: Reflexes are  normal and symmetric.  ?   Reflex Scores: ?     Brachioradialis reflexes are 2+ on the right side and 2+ on the left side. ?     Patellar reflexes are 2+ on the right side and 2+ on the left side. ?Psychiatric:     ?   Attention and Perception: Attention normal.     ?   Mood and Affect: Mood normal.     ?   Speech: Speech normal.     ?   Behavior: Behavior normal. Behavior is cooperative.     ?   Thought Content: Thought content normal.  ? ?Results for orders placed or performed in visit on 03/07/21  ?Bayer DCA Hb A1c Waived  ?Result Value Ref Range  ? HB A1C (BAYER DCA - WAIVED) 7.2 (H) 4.8 - 5.6 %  ?Comprehensive metabolic panel  ?Result Value Ref Range  ? Glucose 121 (H) 70 - 99 mg/dL  ? BUN 12 8 - 27 mg/dL  ? Creatinine, Ser 0.88 0.76 - 1.27 mg/dL  ? eGFR 98 >59 mL/min/1.73  ? BUN/Creatinine Ratio 14 10 - 24  ? Sodium 137 134 - 144 mmol/L  ? Potassium 4.5 3.5 - 5.2 mmol/L  ? Chloride 103 96 - 106 mmol/L  ? CO2 21 20 - 29 mmol/L  ? Calcium 8.9 8.6 - 10.2 mg/dL  ? Total Protein 6.8 6.0 - 8.5 g/dL  ? Albumin 4.2 3.8 - 4.8 g/dL  ? Globulin, Total 2.6 1.5 - 4.5 g/dL  ? Albumin/Globulin Ratio 1.6 1.2 - 2.2  ? Bilirubin Total <0.2 0.0 - 1.2 mg/dL  ? Alkaline Phosphatase 98 44 - 121 IU/L  ? AST 19 0 - 40 IU/L  ? ALT 19 0 - 44 IU/L  ?CBC with Differential/Platelet  ?Result Value Ref Range  ? WBC 5.3 3.4 - 10.8 x10E3/uL  ? RBC 4.51 4.14 - 5.80 x10E6/uL  ? Hemoglobin 14.0 13.0 - 17.7 g/dL  ? Hematocrit 41.8 37.5 - 51.0 %  ? MCV 93 79 - 97 fL  ? MCH 31.0 26.6 - 33.0 pg  ? MCHC 33.5 31.5 - 35.7 g/dL  ? RDW 12.4 11.6 - 15.4 %  ? Platelets 293 150 - 450 x10E3/uL  ? Neutrophils 52 Not Estab. %  ? Lymphs 31 Not Estab. %  ? Monocytes 14 Not Estab. %  ? Eos 1 Not Estab. %  ? Basos 1 Not Estab. %  ? Neutrophils Absolute 2.8 1.4 - 7.0  x10E3/uL  ? Lymphocytes Absolute 1.6 0.7 - 3.1 x10E3/uL  ? Monocytes Absolute 0.7 0.1 - 0.9 x10E3/uL  ? EOS (ABSOLUTE) 0.1 0.0 - 0.4 x10E3/uL  ? Basophils Absolute 0.1 0.0 - 0.2 x10E3/uL  ? Immature Granulocytes 1 Not Estab. %

## 2021-08-24 NOTE — Assessment & Plan Note (Signed)
BMI 30.44, has lost 30 pounds.  Recommended eating smaller high protein, low fat meals more frequently and exercising 30 mins a day 5 times a week with a goal of 10-15lb weight loss in the next 3 months. Patient voiced their understanding and motivation to adhere to these recommendations. ? ?

## 2021-08-24 NOTE — Assessment & Plan Note (Signed)
Continue collaboration with neurosurgery, recent fusion in June 2022.  Seeing neurology due to ongoing symptoms, will obtain labs they had ordered today and he is going to schedule follow-up with Dr. Terrace Arabia. ?

## 2021-08-24 NOTE — Assessment & Plan Note (Signed)
Chronic, ongoing.  BP well below goal on check today.  Continue current medications and adjust as needed.  Recommend he monitor BP at least a few mornings a week at home and document for provider visits.  DASH diet at home. Labs: CMP, CBC, TSH, urine ALB.  Return in  3 months. ?

## 2021-08-24 NOTE — Assessment & Plan Note (Signed)
Chronic, stable with A1c 7.2% last visit, will recheck today, and urine ALB 10, A:C 30-300 in July 2022 -- recheck today.  Continue current medication regimen and adjust as needed based on A1c, has lost weight.  Continue Gabapentin for neuropathy pain and dose renally as needed.  At this time recommend he monitor BS daily and document for visits + focus on diabetic diet. Return in 3 months for follow-up. ?

## 2021-08-24 NOTE — Assessment & Plan Note (Signed)
Refer to cervical myelopathy plan of care. ?

## 2021-08-24 NOTE — Assessment & Plan Note (Signed)
To follow-up with neurology, will obtain labs they had ordered today.  Discussed with patient. ?

## 2021-08-24 NOTE — Assessment & Plan Note (Signed)
Chronic, ongoing.  At this time continue collaboration with psychiatry and current medication regimen as prescribed by them. 

## 2021-08-24 NOTE — Assessment & Plan Note (Signed)
Chronic, ongoing.  Continue current medication regimen and adjust as needed. Lipid panel today. 

## 2021-08-25 NOTE — Progress Notes (Signed)
Contacted via [place] ? ? ?Good evening Tiran, some of your labs have returned, not all yet: ?- Kidney function, creatinine and eGFR, remains normal, as is liver function, AST and ALT.   ?- Sodium level is a little low and potassium level a little low, I recommend adding a little table salt daily to diet + adding in potassium rich foods like dried fruit, bananas, mangoes, avocados, raisin bran.  We will recheck these next visit. ?- Cholesterol labs are stable, continue statin. ?- CBC shows no anemia or infection.  Thyroid, TSH, is normal. ?Once all neurology labs are returned I will alert them.  Please ensure to schedule neurology follow-up.  Any questions on these labs? ?Keep being amazing!!  Thank you for allowing me to participate in your care.  I appreciate you. ?Kindest regards, ?Jillane Po ? ?

## 2021-08-30 LAB — COMPREHENSIVE METABOLIC PANEL
ALT: 16 IU/L (ref 0–44)
AST: 15 IU/L (ref 0–40)
Albumin/Globulin Ratio: 1.4 (ref 1.2–2.2)
Albumin: 4.6 g/dL (ref 3.8–4.8)
Alkaline Phosphatase: 116 IU/L (ref 44–121)
BUN/Creatinine Ratio: 13 (ref 10–24)
BUN: 14 mg/dL (ref 8–27)
Bilirubin Total: 0.4 mg/dL (ref 0.0–1.2)
CO2: 23 mmol/L (ref 20–29)
Calcium: 9.8 mg/dL (ref 8.6–10.2)
Chloride: 92 mmol/L — ABNORMAL LOW (ref 96–106)
Creatinine, Ser: 1.07 mg/dL (ref 0.76–1.27)
Globulin, Total: 3.3 g/dL (ref 1.5–4.5)
Glucose: 136 mg/dL — ABNORMAL HIGH (ref 70–99)
Potassium: 3.4 mmol/L — ABNORMAL LOW (ref 3.5–5.2)
Sodium: 132 mmol/L — ABNORMAL LOW (ref 134–144)
Total Protein: 7.9 g/dL (ref 6.0–8.5)
eGFR: 78 mL/min/{1.73_m2} (ref >59)

## 2021-08-30 LAB — CBC WITH DIFFERENTIAL/PLATELET
Basophils Absolute: 0.1 10*3/uL (ref 0.0–0.2)
Basos: 1 %
EOS (ABSOLUTE): 0.1 10*3/uL (ref 0.0–0.4)
Eos: 1 %
Hematocrit: 48 % (ref 37.5–51.0)
Hemoglobin: 16.7 g/dL (ref 13.0–17.7)
Immature Grans (Abs): 0 10*3/uL (ref 0.0–0.1)
Immature Granulocytes: 0 %
Lymphocytes Absolute: 2 10*3/uL (ref 0.7–3.1)
Lymphs: 30 %
MCH: 32.1 pg (ref 26.6–33.0)
MCHC: 34.8 g/dL (ref 31.5–35.7)
MCV: 92 fL (ref 79–97)
Monocytes Absolute: 0.8 10*3/uL (ref 0.1–0.9)
Monocytes: 13 %
Neutrophils Absolute: 3.7 10*3/uL (ref 1.4–7.0)
Neutrophils: 55 %
Platelets: 320 10*3/uL (ref 150–450)
RBC: 5.21 x10E6/uL (ref 4.14–5.80)
RDW: 13.1 % (ref 11.6–15.4)
WBC: 6.7 10*3/uL (ref 3.4–10.8)

## 2021-08-30 LAB — RPR: RPR Ser Ql: NONREACTIVE

## 2021-08-30 LAB — MULTIPLE MYELOMA PANEL, SERUM
Albumin SerPl Elph-Mcnc: 3.9 g/dL (ref 2.9–4.4)
Albumin/Glob SerPl: 1 (ref 0.7–1.7)
Alpha 1: 0.3 g/dL (ref 0.0–0.4)
Alpha2 Glob SerPl Elph-Mcnc: 1.3 g/dL — ABNORMAL HIGH (ref 0.4–1.0)
B-Globulin SerPl Elph-Mcnc: 1.3 g/dL (ref 0.7–1.3)
Gamma Glob SerPl Elph-Mcnc: 1.2 g/dL (ref 0.4–1.8)
Globulin, Total: 4 g/dL — ABNORMAL HIGH (ref 2.2–3.9)
IgA/Immunoglobulin A, Serum: 398 mg/dL (ref 61–437)
IgG (Immunoglobin G), Serum: 1096 mg/dL (ref 603–1613)
IgM (Immunoglobulin M), Srm: 90 mg/dL (ref 20–172)

## 2021-08-30 LAB — LIPID PANEL W/O CHOL/HDL RATIO
Cholesterol, Total: 134 mg/dL (ref 100–199)
HDL: 37 mg/dL — ABNORMAL LOW (ref >39)
LDL Chol Calc (NIH): 74 mg/dL (ref 0–99)
Triglycerides: 129 mg/dL (ref 0–149)
VLDL Cholesterol Cal: 23 mg/dL (ref 5–40)

## 2021-08-30 LAB — ANA W/REFLEX IF POSITIVE: Anti Nuclear Antibody (ANA): NEGATIVE

## 2021-08-30 LAB — SEDIMENTATION RATE: Sed Rate: 59 mm/hr — ABNORMAL HIGH (ref 0–30)

## 2021-08-30 LAB — PROLACTIN: Prolactin: 6.6 ng/mL (ref 4.0–15.2)

## 2021-08-30 LAB — C-REACTIVE PROTEIN: CRP: <1 mg/L (ref 0–10)

## 2021-08-30 LAB — TSH: TSH: 1.24 u[IU]/mL (ref 0.450–4.500)

## 2021-08-30 LAB — FERRITIN: Ferritin: 104 ng/mL (ref 30–400)

## 2021-08-30 LAB — COPPER, SERUM: Copper: 121 ug/dL (ref 69–132)

## 2021-08-30 NOTE — Progress Notes (Signed)
Good afternoon, I saw this patient recently for diabetes follow-up and saw where you had ordered labs you wanted him to attain, which he had not.  I discussed with him and he asked we do them at visit.  I obtained these and recommended he scheduled follow-up with you.  ESR mild elevation.  MM panel did note elevation in Alpha2 and they are doing protein electrophoresis.  All others you had wanted were stable.  Suggestions on this?  I have told him to schedule follow-up with you.

## 2021-08-30 NOTE — Progress Notes (Signed)
Please alert Cristofher that remainder of labs neurology had wanted have returned and there was elevated in a couple labs which I have alerted neurology too. I again recommend he schedule follow-up with them ASAP to further assess.  Any questions? ?Keep being amazing!!  Thank you for allowing me to participate in your care.  I appreciate you. ?Kindest regards, ?Arnelle Nale ?

## 2021-09-07 DIAGNOSIS — Z0271 Encounter for disability determination: Secondary | ICD-10-CM

## 2021-10-25 ENCOUNTER — Ambulatory Visit: Payer: BLUE CROSS/BLUE SHIELD | Admitting: Neurology

## 2021-10-26 ENCOUNTER — Encounter: Payer: Self-pay | Admitting: Neurology

## 2021-10-26 ENCOUNTER — Ambulatory Visit: Payer: BLUE CROSS/BLUE SHIELD | Admitting: Neurology

## 2021-10-26 ENCOUNTER — Telehealth: Payer: Self-pay | Admitting: Nurse Practitioner

## 2021-10-26 ENCOUNTER — Telehealth: Payer: Self-pay | Admitting: Neurology

## 2021-10-26 VITALS — BP 115/72 | HR 92 | Ht 70.5 in | Wt 205.5 lb

## 2021-10-26 DIAGNOSIS — G959 Disease of spinal cord, unspecified: Secondary | ICD-10-CM | POA: Diagnosis not present

## 2021-10-26 DIAGNOSIS — R269 Unspecified abnormalities of gait and mobility: Secondary | ICD-10-CM

## 2021-10-26 DIAGNOSIS — R899 Unspecified abnormal finding in specimens from other organs, systems and tissues: Secondary | ICD-10-CM | POA: Insufficient documentation

## 2021-10-26 DIAGNOSIS — R42 Dizziness and giddiness: Secondary | ICD-10-CM

## 2021-10-26 DIAGNOSIS — R3915 Urgency of urination: Secondary | ICD-10-CM | POA: Insufficient documentation

## 2021-10-26 DIAGNOSIS — R202 Paresthesia of skin: Secondary | ICD-10-CM | POA: Diagnosis not present

## 2021-10-26 NOTE — Telephone Encounter (Signed)
Referral for Physical Therapy sent to [facility] [phone].

## 2021-10-26 NOTE — Progress Notes (Addendum)
Chief Complaint  Patient presents with   Follow-up    Room 15 - alone. Gait abnormality, paresthesia, dizziness. No change in symptoms. Says he was lost to follow up due to an insurance issues (now resolved).        ASSESSMENT AND PLAN  Derek Garza is a 61 y.o. male  Gait abnormality, Worsening low back pain  EMG nerve conduction study showed no evidence of large fiber peripheral neuropathy, mild chronic bilateral lumbar radiculopathy, he does complains of worsening low back pain, previous MRI of lumbar spine showed degenerative changes, will repeat MRI of lumbar spine,  His gait abnormality is most likely residual symptoms from previous cervical myelopathy, hyperreflexia on examination,  Orthostatic dizziness,   Previous orthostatic hypotension and tachycardia, now improved with increase water intake, but this continues to bother him, desire further evaluation,  Proceed with CT angiogram of neck to rule out large vessel disease posterior circulation insufficiency,  Addendum: MRI of lumbar spine from December 01, 2021 at [facility], no misalignment, multilevel degenerative changes, no significant canal stenosis, variable degree of foraminal narrowing, most pronounced on Derek right at L4-5, and Derek left at L2-3, no evidence of nerve compression.    DIAGNOSTIC DATA (LABS, IMAGING, TESTING) - I reviewed patient records, labs, notes, testing and imaging myself where available. Laboratory evaluation March 2022: Normal vitamin B12, TSH, Lipid panel, LDL 91, triglycerides 248, BMP showed elevated glucose 145, creatinine of 1.04, A1c of six 5.6  I of Derek brain with without contrast on 07/30/2020: No acute abnormality, mild supratentorium small vessel disease  CT cervical spine on November 15, 2018, multilevel degenerative changes, worst at C4-7  CT angiogram of Derek neck at [facility] on November 22, 2021 carotid atherosclerosis without significant focal stenosis, vertebral arteries are normal,  degenerative and post surgical change of Derek cervical spine.  Anterior fusion C3-6, no evidence of hardware failure, multilevel bony neuroforaminal stenosis, canal stenosis at C 5 6, C6-7 HISTORICAL  Derek Garza, XR 61 year old male, seen in request by his primary care nurse practitioner Derek Garza and Dr. Charlotta Garza, Derek Garza, for evaluation of tendency to fall, initial evaluation was on September 24, 2020.  I reviewed and summarized Derek referring note.  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, he continually had recurrent similar episode of sudden overwhelming weakness from head to toes, if he does not steady himself he will fall, eventually went out on short-term disability since June 04, 2020  He uses dizziness to describe his symptoms, on further questioning, he denies vertigo, almost all episodes happen in a standing position, he had sudden onset overwhelming weakness starting from his head traveling to his whole body, if he does not hold onto something, steady himself he would fall to Derek ground, this episode lasted about couple minutes, he denies loss of consciousness,  At baseline, he denies significant  gait change, denies neck pain, denies bowel bladder incontinence,  Reviewed emergency room record, he presented  on November 16, 2018 for fall CT cervical spine reviewed with patient, severe multilevel cervical spondylosis, worst at C4 7, disc osteophyte protruding into canal, causing variable degree of canal stenosis  Update March 23, 2021: He had MRI of cervical spine on Oct 07, 2020, personally reviewed Derek film, which showed evidence of multilevel degenerative changes, C5-6, severe spinal stenosis with severe bilateral foraminal stenosis, myelomalacia within Derek spinal cord, moderate spinal stenosis C4-5, severe bilateral foraminal stenosis, C3-4 with severe foraminal stenosis  He underwent anterior ACDF C3-4-5 per patient by Dr. Danielle Garza in June 2020, recovering well, continue have some stiff neck, but decrease Derek pain, continue has mild gait abnormality, lower extremity paresthesia, intermittent low back pain, denies bowel and bladder incontinence,  In addition, he complains of frequent dizziness, especially when he get up quickly from sitting position  Blood pressure/heart rate today, sitting down 134/81, 102; getting up 107/67, 116;, standing up from 1 minutes 104/62, 111  He is on long term disability, because his frequent dizziness, unsteady gait,  Reviewed laboratory evaluation in 2022: Normal B12, PSA, TSH, lipid panel, CBC, hemoglobin 14.4, CMP, creatinine of 0.94, A1c 6.8,  EMG nerve conduction study March 23, 2021 showed no large fiber peripheral neuropathy, chronic mild bilateral lumbosacral radiculopathy, no evidence of active process.  Above findings could not rule out possibility of small fiber neuropathy  UPDATE Oct 26 2021: He continued to complaint of unsteady gait, especially with sudden positional turning, bilateral lower extremity paresthesia, limited range of motion of his neck,  He has low back pain, radiating pain to right lower extremity  MRI in 2011 at [facility] system reported to multilevel degenerative changes, left paracentral small herniated disc at L2-3, compressing Derek left  ventro lateral aspect of Derek thecal sac, with mild central canal stenosis, broad-based, right paracentral disc herniation with disc extruded inferiorly from L4-5, mild central canal stenosis, impinge upon Derek descending right nerve roots at L4-5  He denies significant upper extremity numbness, weakness, has urinary urgency,  He also complains of lightheaded, unsteadiness sensation when getting up from seated position,  Personally reviewed MRI of Derek brain with and without contrast March 2022, mild small vessel disease, no acute abnormality.  Laboratory in 23, normal CBC, TSH, RPR, ANA, ferritin, C-reactive protein, copper, no M protein spike, CMP showed normal creatinine 1.07, lipid panel LDL 74  REVIEW OF SYSTEMS:  Full 14 system review of systems performed and notable only for as above All other review of systems were negative.    PHYSICAL EXAM:   Today's Vitals   10/26/21 1401  BP: 115/72  Pulse: 92  Weight: 205 lb 8 oz (93.2 kg)  Height: 5' 10.5" (1.791 m)   Body mass index is 29.07 kg/m.   PHYSICAL EXAMNIATION:  Gen: NAD, conversant, well nourised, well groomed           NEUROLOGICAL EXAM:  MENTAL STATUS: Speech/cognition: Awake, alert, oriented to history taking and casual conversation   CRANIAL NERVES: CN II: Visual fields are full to confrontation. Pupils are round equal and briskly reactive to light. CN III, IV, VI: extraocular movement are normal. No ptosis. CN V: Facial sensation is intact to light touch CN VII: Face is symmetric with normal eye closure  CN VIII: Hearing is normal to causal conversation. CN IX, X: Phonation is normal. CN XI: Head turning and shoulder shrug are intact  MOTOR: There is  no pronator drift of out-stretched arms. Muscle bulk and tone are normal. Muscle strength is normal.  REFLEXES: Reflexes are 2+ and symmetric at Derek biceps, triceps, 3/3 knees, and absent at ankles. Plantar responses are flexor.  SENSORY: Absent vibratory  sensation at toes, length dependent decreased light touch, pinprick to mid shin level.  COORDINATION:  There is no trunk or limb dysmetria noted.  GAIT/STANCE: He can get up from seated position arm crossed, tendency to pointing toes outwards, wide-based, mildly unsteady, difficulty stand on tiptoe, and heels, difficulty performing tandem walking  ALLERGIES: Allergies  Allergen Reactions   Acetaminophen    Percocet [Oxycodone-Acetaminophen] Nausea Only    HOME MEDICATIONS: Current Outpatient Medications  Medication Sig Dispense Refill   atorvastatin (LIPITOR) 10 MG tablet Take 1 tablet (10 mg total) by mouth daily. 90 tablet 4   benazepril (LOTENSIN) 40 MG tablet Take 1 tablet (40 mg total) by mouth daily. 90 tablet 4   carbamazepine (TEGRETOL XR) 200 MG 12 hr tablet 200 mg 2 (two) times daily.     cyclobenzaprine (FLEXERIL) 10 MG tablet take 1 tablet by oral route 3 times a day as needed for mscle spasm     empagliflozin (JARDIANCE) 25 MG TABS tablet Take 1 tablet (25 mg total) by mouth daily. 90 tablet 4   furosemide (LASIX) 80 MG tablet TAKE 0.5 TABLETS BY MOUTH DAILY. 45 tablet 4   gabapentin (NEURONTIN) 600 MG tablet Take 1 tablet (600 mg total) by mouth 2 (two) times daily. 180 tablet 4   metFORMIN (GLUCOPHAGE) 500 MG tablet Take 1 tablet (500 mg total) by mouth 2 (two) times daily with a meal. 180 tablet 4   potassium chloride (KLOR-CON) 10 MEQ tablet Take 1 tablet (10 mEq total) by mouth daily. 90 tablet 4   QUEtiapine (SEROQUEL) 400 MG tablet Take 400 mg by mouth daily.      tadalafil (CIALIS) 20 MG tablet Take 1 tablet (20 mg total) by mouth daily as needed for erectile dysfunction. 100 tablet 0   No current facility-administered medications for this visit.    PAST MEDICAL HISTORY: Past Medical History:  Diagnosis Date   Diabetes mellitus without complication (HCC)    Hypertension     PAST SURGICAL HISTORY: Past Surgical History:  Procedure Laterality Date    ANKLE SURGERY  05/29/1977   APPENDECTOMY  05/29/2009   CERVICAL SPINE SURGERY     HERNIA REPAIR  [phone]   SPINE SURGERY  10/2018   VASECTOMY      FAMILY HISTORY: Family History  Problem Relation Age of Onset   Breast cancer Sister     SOCIAL HISTORY: Social History   Socioeconomic History   Marital status: Divorced    Spouse name: Not on file   Number of children: Not on file   Years of education: Not on file   Highest education level: Not on file  Occupational History   Occupation: on [employer]  Tobacco Use   Smoking status: Never   Smokeless tobacco: Never  Vaping Use   Vaping Use: Never used  Substance and Sexual Activity   Alcohol use: Yes    Comment: on occasion   Drug use: No   Sexual activity: Not Currently  Other Topics Concern   Not on file  Social History Narrative   Lives alone   Right Handed   Drinks 2 liter of soda daily   Social Determinants of Health   Financial Resource Strain: Not on file  Food Insecurity: Not  on file  Transportation Needs: Not on file  Physical Activity: Not on file  Stress: Not on file  Social Connections: Not on file  Intimate Partner Violence: Not on file      Levert Feinstein, M.D. Ph.D.  [facility] [address], [address] [place], [place] [address] Ph: [phone] Fax: [phone]  CC:  Marjie Skiff, NP [address] [place],  [place] [address]  Derek Garza T, NP   Addendum: Neurosurgical evaluation by Dr. 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

## 2021-10-26 NOTE — Telephone Encounter (Signed)
Copied from [place] [id number]. Topic: Referral - Request for Referral >> Oct 26, 2021  3:57 PM Roslynn Amble wrote: Has patient seen PCP for this complaint? Yes.   Referral for which specialty: Cardiology in [facility] physicians name unknown Preferred provider/office: Location unknown Reason for referral: Dizziness and blood pressure dropping (patient not currently experiencing these symptoms)  Patient very frustrated. He was referred to neurology who then referred him to physical therapy and he believes physical therapy is not the answer. Just really wants to figure out what is going on. Please follow up with the patient

## 2021-10-27 LAB — CK: Total CK: 117 U/L (ref 41–331)

## 2021-10-27 LAB — C-REACTIVE PROTEIN: CRP: 2 mg/L (ref 0–10)

## 2021-10-27 LAB — SEDIMENTATION RATE: Sed Rate: 6 mm/hr (ref 0–30)

## 2021-10-27 NOTE — Telephone Encounter (Signed)
Spoke with patient and he was unaware of the provider he saw at the time in the past. But, patient says their office is [facility] in [place], [place] on [address]. Please advise?

## 2021-10-27 NOTE — Telephone Encounter (Signed)
Patient has been made aware of referral. No further questions.

## 2021-11-16 ENCOUNTER — Telehealth: Payer: Self-pay | Admitting: Neurology

## 2021-11-16 NOTE — Telephone Encounter (Signed)
Yetta Numbers: [id number] exp. 11/16/21-12/15/21 sent to [facility] in [place] per insurance that is the closest to him that is in network

## 2021-11-17 ENCOUNTER — Telehealth: Payer: Self-pay | Admitting: Neurology

## 2021-11-17 NOTE — Telephone Encounter (Signed)
Patient had cervical decompression surgery by Dr. Danielle Dess in June 2020,  Current MRI is for evaluation of MRI of lumbar spine, from reviewing previous note, the choice of facility is limited due to his insurance,  It is okay for him to proceed with the MRI of lumbar spine at suggested facility given if this does not have metal reduction sequence,  Yetta Numbers: [id number] exp. 11/16/21-12/15/21 sent to [facility] in [place] per insurance that is the closest to him that is in network  What is a metal reduction MRI? Metal artifact reduction sequence  Radiology Reference ... A metal artifact reduction sequence (MARS) is intended to reduce the size and intensity of susceptibility artifacts resulting from magnetic field distortion.Dec 13, 2018

## 2021-11-17 NOTE — Telephone Encounter (Signed)
Misty Stanley from [facility] called needing to be advised on what to do about the pt's CT Scan/MRI that were ordered for the pt. She states that [facility] does not have the Metal Reduction Program and the pt thinks he has metal in the back of the Neck. Pt is refusing to go anywhere else that does have the Metal Reduction Program and Misty Stanley is needing to know if this is ok with the provider or what does the provider advise.

## 2021-11-21 NOTE — Telephone Encounter (Signed)
I spoke with the patient and confirmed with him that Dr. Terrace Arabia said it is ok to go ahead with the scans at this location.

## 2021-11-24 ENCOUNTER — Ambulatory Visit: Payer: BLUE CROSS/BLUE SHIELD | Admitting: Nurse Practitioner

## 2021-11-24 DIAGNOSIS — E1142 Type 2 diabetes mellitus with diabetic polyneuropathy: Secondary | ICD-10-CM

## 2021-11-24 DIAGNOSIS — E1169 Type 2 diabetes mellitus with other specified complication: Secondary | ICD-10-CM

## 2021-11-24 DIAGNOSIS — N400 Enlarged prostate without lower urinary tract symptoms: Secondary | ICD-10-CM

## 2021-11-24 DIAGNOSIS — F3181 Bipolar II disorder: Secondary | ICD-10-CM

## 2021-11-24 DIAGNOSIS — R42 Dizziness and giddiness: Secondary | ICD-10-CM

## 2021-11-24 DIAGNOSIS — I152 Hypertension secondary to endocrine disorders: Secondary | ICD-10-CM

## 2021-11-24 DIAGNOSIS — E6609 Other obesity due to excess calories: Secondary | ICD-10-CM

## 2021-11-30 NOTE — Telephone Encounter (Signed)
Pt is calling to make sure his medication for his MRI has been ordered. He wanted to make sure before going to  MRI appointment tomorrow. Pt would like a call from the nurse confirming the medication has been sent.

## 2021-12-07 ENCOUNTER — Telehealth: Payer: Self-pay | Admitting: Neurology

## 2021-12-07 NOTE — Telephone Encounter (Signed)
  Please call patient MRI of lumbar spine from December 01, 2021, no misalignment, multilevel degenerative changes, no significant canal stenosis, variable degree of foraminal narrowing, most pronounced on the right at L4-5, and the left at L2-3, no evidence of nerve compression.  1. Extensive multilevel disc and facet degeneration throughout the lumbar and low thoracic spine.  2. Multilevel narrowing of the subarticular recesses most pronounced on the right at L4-5 and on the left at L2-3.  3. Multilevel neural foraminal stenosis throughout the lumbar spine, specifics above.  4. Findings suggestive of a subacute Schmorl's node superior L5 endplate with edema, this could account for localized pain.

## 2021-12-07 NOTE — Telephone Encounter (Signed)
Received paperwork from [facility], will give to medical records

## 2021-12-08 NOTE — Telephone Encounter (Signed)
I called the pt and we discussed results he verbalized understanding and appreciations for the call. We have not received the CT results yet, will inquire if this report could be sent as well.

## 2021-12-08 NOTE — Telephone Encounter (Signed)
Please advise patient that his CT angiogram of the neck with and without contrast did not show any significant blockages in the neck arteries.  He does have degenerative neck disease which was visible, not a new finding though.

## 2021-12-08 NOTE — Telephone Encounter (Signed)
I have printed the CTA of Neck from care everywhere, I am not sure why this was not received? Could not find a result note in the chart about this study.  Will have workin doctor review while Dr. Terrace Arabia is out of the office.

## 2021-12-12 NOTE — Telephone Encounter (Signed)
I called the pt and relayed the result. Pt verbalized understanding and appreciation for the call.

## 2021-12-23 ENCOUNTER — Ambulatory Visit: Payer: BLUE CROSS/BLUE SHIELD | Admitting: Nurse Practitioner

## 2021-12-23 DIAGNOSIS — E6609 Other obesity due to excess calories: Secondary | ICD-10-CM

## 2021-12-23 DIAGNOSIS — E1159 Type 2 diabetes mellitus with other circulatory complications: Secondary | ICD-10-CM

## 2021-12-23 DIAGNOSIS — E1142 Type 2 diabetes mellitus with diabetic polyneuropathy: Secondary | ICD-10-CM

## 2021-12-23 DIAGNOSIS — R42 Dizziness and giddiness: Secondary | ICD-10-CM

## 2021-12-23 DIAGNOSIS — N400 Enlarged prostate without lower urinary tract symptoms: Secondary | ICD-10-CM

## 2021-12-23 DIAGNOSIS — F3181 Bipolar II disorder: Secondary | ICD-10-CM

## 2021-12-23 DIAGNOSIS — G959 Disease of spinal cord, unspecified: Secondary | ICD-10-CM

## 2021-12-23 DIAGNOSIS — E1169 Type 2 diabetes mellitus with other specified complication: Secondary | ICD-10-CM

## 2021-12-29 DIAGNOSIS — I951 Orthostatic hypotension: Secondary | ICD-10-CM | POA: Insufficient documentation

## 2022-01-09 ENCOUNTER — Telehealth: Payer: Self-pay | Admitting: Neurology

## 2022-01-09 NOTE — Telephone Encounter (Signed)
I called Dr. Petra Kuba, who is evaluating his application for disability, stated "will let the record decide"

## 2022-01-09 NOTE — Telephone Encounter (Signed)
I talked with patient about his exam findings  CT angiogram at [facility] system on November 22, 2021 showed evidence of cervical degenerative changes, postsurgical changes, no evidence of large vessel disease  MRI of lumbar spine on December 01, 2021, extensive multilevel disc and facet degenerative changes throughout the lumbar lower thoracic spine, multilevel narrowing of the subarticular recess most pronounced at right at L4-5, on the left L2-3, multilevel foraminal stenosis throughout the lumbar spine, there is also a subacute Schmorl's node superior L5 endplates with edema, this could account for localized pain

## 2022-01-09 NOTE — Telephone Encounter (Signed)
Dr Petra Kuba is asking for a call from Dr Terrace Arabia to discuss this pt his cell# [phone]

## 2022-02-11 ENCOUNTER — Other Ambulatory Visit: Payer: Self-pay | Admitting: Nurse Practitioner

## 2022-02-13 NOTE — Telephone Encounter (Signed)
Requested Prescriptions  Pending Prescriptions Disp Refills  . atorvastatin (LIPITOR) 10 MG tablet [Pharmacy Med Name: ATORVASTATIN 10 MG TABLET] 90 tablet 1    Sig: TAKE 1 TABLET BY MOUTH EVERY DAY     Cardiovascular:  Antilipid - Statins Failed - 02/11/2022 11:05 AM      Failed - Lipid Panel in normal range within the last 12 months    Cholesterol, Total  Date Value Ref Range Status  08/24/2021 134 100 - 199 mg/dL Final   Cholesterol Piccolo, Waived  Date Value Ref Range Status  05/14/2018 160 <200 mg/dL Final    Comment:                            Desirable                <200                         Borderline High      200- 239                         High                     >239    LDL Chol Calc (NIH)  Date Value Ref Range Status  08/24/2021 74 0 - 99 mg/dL Final   HDL  Date Value Ref Range Status  08/24/2021 37 (L) >39 mg/dL Final   Triglycerides  Date Value Ref Range Status  08/24/2021 129 0 - 149 mg/dL Final   Triglycerides Piccolo,Waived  Date Value Ref Range Status  05/14/2018 182 (H) <150 mg/dL Final    Comment:                            Normal                   <150                         Borderline High     150 - 199                         High                200 - 499                         Very High                >499          Passed - Patient is not pregnant      Passed - Valid encounter within last 12 months    Recent Outpatient Visits          5 months ago DM type 2 with diabetic peripheral neuropathy ([place])   [place], Jolene T, NP   11 months ago DM type 2 with diabetic peripheral neuropathy ([place])   [place], [place] T, NP   1 year ago Abrasion of finger of right hand, initial encounter   [facility] Jon Billings, NP   1 year ago DM type 2 with diabetic peripheral neuropathy ([place])   [place], Barbaraann Faster, NP   1  year ago Cervical myelopathy [facility])    [place], Barbaraann Faster, NP

## 2022-03-22 ENCOUNTER — Other Ambulatory Visit: Payer: Self-pay | Admitting: Nurse Practitioner

## 2022-04-23 NOTE — Patient Instructions (Signed)
Diabetes Mellitus Basics  Diabetes mellitus, or diabetes, is a long-term (chronic) disease. It occurs when the body does not properly use sugar (glucose) that is released from food after you eat. Diabetes mellitus may be caused by one or both of these problems: Your pancreas does not make enough of a hormone called insulin. Your body does not react in a normal way to the insulin that it makes. Insulin lets glucose enter cells in your body. This gives you energy. If you have diabetes, glucose cannot get into cells. This causes high blood glucose (hyperglycemia). How to treat and manage diabetes You may need to take insulin or other diabetes medicines daily to keep your glucose in balance. If you are prescribed insulin, you will learn how to give yourself insulin by injection. You may need to adjust the amount of insulin you take based on the foods that you eat. You will need to check your blood glucose levels using a glucose monitor as told by your health care provider. The readings can help determine if you have low or high blood glucose. Generally, you should have these blood glucose levels: Before meals (preprandial): 80-130 mg/dL (4.4-7.2 mmol/L). After meals (postprandial): below 180 mg/dL (10 mmol/L). Hemoglobin A1c (HbA1c) level: less than 7%. Your health care provider will set treatment goals for you. Keep all follow-up visits. This is important. Follow these instructions at home: Diabetes medicines Take your diabetes medicines every day as told by your health care provider. List your diabetes medicines here: Name of medicine: ______________________________ Amount (dose): _______________ Time (a.m./p.m.): _______________ Notes: ___________________________________ Name of medicine: ______________________________ Amount (dose): _______________ Time (a.m./p.m.): _______________ Notes: ___________________________________ Name of medicine: ______________________________ Amount (dose):  _______________ Time (a.m./p.m.): _______________ Notes: ___________________________________ Insulin If you use insulin, list the types of insulin you use here: Insulin type: ______________________________ Amount (dose): _______________ Time (a.m./p.m.): _______________Notes: ___________________________________ Insulin type: ______________________________ Amount (dose): _______________ Time (a.m./p.m.): _______________ Notes: ___________________________________ Insulin type: ______________________________ Amount (dose): _______________ Time (a.m./p.m.): _______________ Notes: ___________________________________ Insulin type: ______________________________ Amount (dose): _______________ Time (a.m./p.m.): _______________ Notes: ___________________________________ Insulin type: ______________________________ Amount (dose): _______________ Time (a.m./p.m.): _______________ Notes: ___________________________________ Managing blood glucose  Check your blood glucose levels using a glucose monitor as told by your health care provider. Write down the times that you check your glucose levels here: Time: _______________ Notes: ___________________________________ Time: _______________ Notes: ___________________________________ Time: _______________ Notes: ___________________________________ Time: _______________ Notes: ___________________________________ Time: _______________ Notes: ___________________________________ Time: _______________ Notes: ___________________________________  Low blood glucose Low blood glucose (hypoglycemia) is when glucose is at or below 70 mg/dL (3.9 mmol/L). Symptoms may include: Feeling: Hungry. Sweaty and clammy. Irritable or easily upset. Dizzy. Sleepy. Having: A fast heartbeat. A headache. A change in your vision. Numbness around the mouth, lips, or tongue. Having trouble with: Moving (coordination). Sleeping. Treating low blood glucose To treat low blood  glucose, eat or drink something containing sugar right away. If you can think clearly and swallow safely, follow the 15:15 rule: Take 15 grams of a fast-acting carb (carbohydrate), as told by your health care provider. Some fast-acting carbs are: Glucose tablets: take 3-4 tablets. Hard candy: eat 3-5 pieces. Fruit juice: drink 4 oz (120 mL). Regular (not diet) soda: drink 4-6 oz (120-180 mL). Honey or sugar: eat 1 Tbsp (15 mL). Check your blood glucose levels 15 minutes after you take the carb. If your glucose is still at or below 70 mg/dL (3.9 mmol/L), take 15 grams of a carb again. If your glucose does not go above 70 mg/dL (3.9 mmol/L) after   3 tries, get help right away. After your glucose goes back to normal, eat a meal or a snack within 1 hour. Treating very low blood glucose If your glucose is at or below 54 mg/dL (3 mmol/L), you have very low blood glucose (severe hypoglycemia). This is an emergency. Do not wait to see if the symptoms will go away. Get medical help right away. Call your local emergency services (911 in the [place] Do not drive yourself to the hospital. Questions to ask your health care provider Should I talk with a diabetes educator? What equipment will I need to care for myself at home? What diabetes medicines do I need? When should I take them? How often do I need to check my blood glucose levels? What number can I call if I have questions? When is my follow-up visit? Where can I find a support group for people with diabetes? Where to find more information American Diabetes Association: www.diabetes.org Association of Diabetes Care and Education Specialists: www.diabeteseducator.org Contact a health care provider if: Your blood glucose is at or above 240 mg/dL (13.3 mmol/L) for 2 days in a row. You have been sick or have had a fever for 2 days or more, and you are not getting better. You have any of these problems for more than 6 hours: You cannot eat or  drink. You feel nauseous. You vomit. You have diarrhea. Get help right away if: Your blood glucose is lower than 54 mg/dL (3 mmol/L). You get confused. You have trouble thinking clearly. You have trouble breathing. These symptoms may represent a serious problem that is an emergency. Do not wait to see if the symptoms will go away. Get medical help right away. Call your local emergency services (911 in the [place] Do not drive yourself to the hospital. Summary Diabetes mellitus is a chronic disease that occurs when the body does not properly use sugar (glucose) that is released from food after you eat. Take insulin and diabetes medicines as told. Check your blood glucose every day, as often as told. Keep all follow-up visits. This is important. This information is not intended to replace advice given to you by your health care provider. Make sure you discuss any questions you have with your health care provider. Document Revised: 09/16/2019 Document Reviewed: 09/16/2019 Elsevier Patient Education  2023 [employer] Inc.  

## 2022-04-25 ENCOUNTER — Telehealth: Payer: Self-pay | Admitting: Nurse Practitioner

## 2022-04-25 ENCOUNTER — Encounter: Payer: Self-pay | Admitting: Nurse Practitioner

## 2022-04-25 ENCOUNTER — Ambulatory Visit (INDEPENDENT_AMBULATORY_CARE_PROVIDER_SITE_OTHER): Payer: BLUE CROSS/BLUE SHIELD | Admitting: Nurse Practitioner

## 2022-04-25 VITALS — BP 105/67 | HR 89 | Temp 97.8°F | Ht 70.51 in | Wt 189.1 lb

## 2022-04-25 DIAGNOSIS — E1142 Type 2 diabetes mellitus with diabetic polyneuropathy: Secondary | ICD-10-CM | POA: Diagnosis not present

## 2022-04-25 DIAGNOSIS — E1159 Type 2 diabetes mellitus with other circulatory complications: Secondary | ICD-10-CM | POA: Diagnosis not present

## 2022-04-25 DIAGNOSIS — E1169 Type 2 diabetes mellitus with other specified complication: Secondary | ICD-10-CM | POA: Diagnosis not present

## 2022-04-25 DIAGNOSIS — F3181 Bipolar II disorder: Secondary | ICD-10-CM | POA: Diagnosis not present

## 2022-04-25 DIAGNOSIS — R42 Dizziness and giddiness: Secondary | ICD-10-CM

## 2022-04-25 DIAGNOSIS — G959 Disease of spinal cord, unspecified: Secondary | ICD-10-CM

## 2022-04-25 DIAGNOSIS — I152 Hypertension secondary to endocrine disorders: Secondary | ICD-10-CM

## 2022-04-25 DIAGNOSIS — Z1211 Encounter for screening for malignant neoplasm of colon: Secondary | ICD-10-CM

## 2022-04-25 DIAGNOSIS — N400 Enlarged prostate without lower urinary tract symptoms: Secondary | ICD-10-CM

## 2022-04-25 DIAGNOSIS — E6609 Other obesity due to excess calories: Secondary | ICD-10-CM

## 2022-04-25 DIAGNOSIS — Z23 Encounter for immunization: Secondary | ICD-10-CM

## 2022-04-25 DIAGNOSIS — E559 Vitamin D deficiency, unspecified: Secondary | ICD-10-CM

## 2022-04-25 DIAGNOSIS — I951 Orthostatic hypotension: Secondary | ICD-10-CM

## 2022-04-25 DIAGNOSIS — E785 Hyperlipidemia, unspecified: Secondary | ICD-10-CM

## 2022-04-25 LAB — BAYER DCA HB A1C WAIVED: HB A1C (BAYER DCA - WAIVED): 4.9 % (ref 4.8–5.6)

## 2022-04-25 NOTE — Assessment & Plan Note (Signed)
Chronic, ongoing.  Continue current medication regimen and adjust as needed. Lipid panel today. 

## 2022-04-25 NOTE — Telephone Encounter (Signed)
PT brought parking placard form to be filled out by provider.  Would like phone call when complete. Placed in providers folder.

## 2022-04-25 NOTE — Assessment & Plan Note (Signed)
Refer to cervical myelopathy plan of care.  Overall is stable at this time.

## 2022-04-25 NOTE — Progress Notes (Addendum)
BP 105/67   Pulse 89   Temp 97.8 F (36.6 C) (Oral)   Ht 5' 10.51" (1.791 m)   Wt 189 lb 1.6 oz (85.8 kg)   SpO2 98%   BMI 26.74 kg/m    Subjective:    Patient ID: Derek Garza, male    DOB: 07-Oct-1960, 61 y.o.   MRN: [id number]  HPI: Derek Garza is a 61 y.o. male  Chief Complaint  Patient presents with   med check   Hypertension   Diabetes   DIABETES A1c last visit 7.1% in March.  Continues as Metformin 500 MG BID and Jardiance 25 MG QDAY.  Takes Gabapentin 600 MG BID for neuropathy discomfort.  Has lost 26 pounds since March, has been working on weight loss for overall health. Hypoglycemic episodes:no Polydipsia/polyuria: no Visual disturbance: no Chest pain: no Paresthesias: no Glucose Monitoring: yes             Accucheck frequency: daily             Fasting glucose: 99 to 106             Post prandial:             Evening:             Before meals: Taking Insulin?: no             Long acting insulin:             Short acting insulin: Blood Pressure Monitoring: not checking Retinal Examination: Not Up to Date -- needs to schedule, [facility] Foot Exam: Up to Date Pneumovax: Up to Date Influenza: Up to Date Aspirin: no    HYPERTENSION / HYPERLIPIDEMIA Continues on Lasix 80 MG daily, K+, and Atorvastatin 10 MG daily.  Saw cardiology at [facility] on 02/09/22 last and they stopped Benazepril on 12/29/21 -- this appeared to help some of his chronic dizziness.  He reports he is back on Benazepril due to BP elevations presenting without it.  Fused C 3, 4, 5 on June 20th, 2022 -- for cervical myelopathy.  He had ongoing dizziness after this and is followed by neurology, last visit 12/01/21.   Satisfied with current treatment? yes Duration of hypertension: chronic BP monitoring frequency: occasional BP range: BP medication side effects: no Duration of hyperlipidemia: chronic Cholesterol medication side effects: no Cholesterol supplements: none Medication compliance:  good compliance Aspirin: no Recent stressors: no Recurrent headaches: no Visual changes: no Palpitations: no Dyspnea: no Chest pain: no Lower extremity edema: no Dizzy/lightheaded: occasional -- followed by neurology   DEPRESSION Currently taking Seroquel and Tegretol for mood.  Follows with Dr. Sharrie Rothman in [place]. Mood status: stable Satisfied with current treatment?: yes Symptom severity: moderate  Duration of current treatment : chronic Side effects: no Medication compliance: good compliance Previous psychiatric medications: multiple Depressed mood: no Anxious mood: occasional Anhedonia: no Significant weight loss or gain: no Insomnia: none Fatigue: no Feelings of worthlessness or guilt: no Impaired concentration/indecisiveness: no Suicidal ideations: no Hopelessness: no Crying spells: no    04/25/2022    8:52 AM 08/24/2021   10:36 AM 03/07/2021    8:33 AM 12/03/2020    8:32 AM 10/13/2020    9:16 AM  Depression screen PHQ 2/9  Decreased Interest 0 0 0 0 0  Down, Depressed, Hopeless 0 0 0  0  PHQ - 2 Score 0 0 0 0 0  Altered sleeping 0 1 0    Tired, decreased energy 0  0 0    Change in appetite 0 0 0    Feeling bad or failure about yourself  0 0 0    Trouble concentrating 0 0 0    Moving slowly or fidgety/restless 0 0 0    Suicidal thoughts 0 0 0    PHQ-9 Score 0 1 0    Difficult doing work/chores Not difficult at all Not difficult at all Not difficult at all         04/25/2022    8:52 AM 08/24/2021   10:38 AM  GAD 7 : Generalized Anxiety Score  Nervous, Anxious, on Edge 0 0  Control/stop worrying 0 0  Worry too much - different things 0 0  Trouble relaxing 0 0  Restless 0 0  Easily annoyed or irritable 0 0  Afraid - awful might happen 0 0  Total GAD 7 Score 0 0  Anxiety Difficulty Not difficult at all Not difficult at all   Relevant past medical, surgical, family and social history reviewed and updated as indicated. Interim medical history since  our last visit reviewed. Allergies and medications reviewed and updated.  Review of Systems  Constitutional:  Negative for activity change, diaphoresis, fatigue and fever.  Respiratory:  Negative for cough, chest tightness, shortness of breath and wheezing.   Cardiovascular:  Negative for chest pain, palpitations and leg swelling.  Gastrointestinal: Negative.   Endocrine: Negative for cold intolerance, heat intolerance, polydipsia, polyphagia and polyuria.  Neurological:  Positive for dizziness (occasional, but manageable). Negative for syncope, weakness, light-headedness, numbness and headaches.  Psychiatric/Behavioral: Negative.     Per HPI unless specifically indicated above     Objective:    BP 105/67   Pulse 89   Temp 97.8 F (36.6 C) (Oral)   Ht 5' 10.51" (1.791 m)   Wt 189 lb 1.6 oz (85.8 kg)   SpO2 98%   BMI 26.74 kg/m   Wt Readings from Last 3 Encounters:  04/25/22 189 lb 1.6 oz (85.8 kg)  10/26/21 205 lb 8 oz (93.2 kg)  08/24/21 215 lb 3.2 oz (97.6 kg)    Physical Exam Vitals and nursing note reviewed.  Constitutional:      General: He is awake. He is not in acute distress.    Appearance: He is well-developed and well-groomed. He is not ill-appearing or toxic-appearing.  HENT:     Head: Normocephalic and atraumatic.     Right Ear: Hearing and external ear normal. No drainage.     Left Ear: Hearing and external ear normal. No drainage.  Eyes:     General: Lids are normal.        Right eye: No discharge.        Left eye: No discharge.     Conjunctiva/sclera: Conjunctivae normal.     Pupils: Pupils are equal, round, and reactive to light.  Neck:     Thyroid: No thyromegaly.     Vascular: No carotid bruit.     Trachea: Trachea normal.  Cardiovascular:     Rate and Rhythm: Normal rate and regular rhythm.     Heart sounds: Normal heart sounds, S1 normal and S2 normal. No murmur heard.    No gallop.  Pulmonary:     Effort: Pulmonary effort is normal. No  accessory muscle usage or respiratory distress.     Breath sounds: Normal breath sounds.  Abdominal:     General: Bowel sounds are normal.     Palpations: Abdomen is soft.  Musculoskeletal:  General: Normal range of motion.     Cervical back: Normal range of motion and neck supple.     Right lower leg: No edema.     Left lower leg: No edema.  Lymphadenopathy:     Cervical: No cervical adenopathy.  Skin:    General: Skin is warm and dry.     Capillary Refill: Capillary refill takes less than 2 seconds.     Findings: No rash.  Neurological:     Mental Status: He is alert and oriented to person, place, and time.     Coordination: Coordination is intact.     Gait: Gait is intact.     Deep Tendon Reflexes: Reflexes are normal and symmetric.     Reflex Scores:      Brachioradialis reflexes are 2+ on the right side and 2+ on the left side.      Patellar reflexes are 2+ on the right side and 2+ on the left side. Psychiatric:        Attention and Perception: Attention normal.        Mood and Affect: Mood normal.        Speech: Speech normal.        Behavior: Behavior normal. Behavior is cooperative.        Thought Content: Thought content normal.    Diabetic Foot Exam - Simple   Simple Foot Form Visual Inspection No deformities, no ulcerations, no other skin breakdown bilaterally: Yes Sensation Testing Intact to touch and monofilament testing bilaterally: Yes Pulse Check Posterior Tibialis and Dorsalis pulse intact bilaterally: Yes Comments     Results for orders placed or performed in visit on 10/26/21  CK  Result Value Ref Range   Total CK 117 41 - 331 U/L  Sedimentation rate  Result Value Ref Range   Sed Rate 6 0 - 30 mm/hr  C-reactive protein  Result Value Ref Range   CRP 2 0 - 10 mg/L      Assessment & Plan:   Problem List Items Addressed This Visit       Cardiovascular and Mediastinum   Hypertension associated with diabetes (HCC)    Chronic, stable  with BP well at goal today.  Continue current medications and adjust as needed.  Recommend he monitor BP at least a few mornings a week at home and document for provider visits.  DASH diet at home. Labs: CMP, CBC, TSH.  Continue collaboration with [facility] cardiology.  Return in 6 months.      Relevant Medications   benazepril (LOTENSIN) 40 MG tablet   Other Relevant Orders   Bayer DCA Hb A1c Waived   CBC with Differential/Platelet   Comprehensive metabolic panel   TSH     Endocrine   DM type 2 with diabetic peripheral neuropathy (HCC) - Primary    Chronic, stable with A1c well below goal with weight loss today at 4.9% -- PRAISED FOR THIS, and urine ALB 10, A:C 30-300 last check.  Stop Metformin at this time and continue Jardiance only, if elevations in sugars at home he will alert provider.  Continue Gabapentin for neuropathy pain and dose renally as needed.  At this time recommend he monitor BS daily and document for visits + focus on diabetic diet. Return in 6 months for follow-up.      Relevant Medications   benazepril (LOTENSIN) 40 MG tablet   Other Relevant Orders   Bayer DCA Hb A1c Waived   Hyperlipidemia associated with type 2 diabetes  mellitus (HCC)    Chronic, ongoing.  Continue current medication regimen and adjust as needed.  Lipid panel today.      Relevant Medications   benazepril (LOTENSIN) 40 MG tablet   Other Relevant Orders   Bayer DCA Hb A1c Waived   Comprehensive metabolic panel   Lipid Panel w/o Chol/HDL Ratio     Nervous and Auditory   Cervical myelopathy (HCC)    Stable.  Continue collaboration with neurosurgery, fusion in June 2022.       Relevant Orders   CBC with Differential/Platelet     Other   Bipolar 2 disorder (HCC)    Chronic, ongoing.  At this time continue collaboration with psychiatry and current medication regimen as prescribed by them.      Relevant Orders   TSH   Dizziness    Refer to cervical myelopathy plan of care.  Overall is stable  at this time.      Relevant Orders   Comprehensive metabolic panel   Other Visit Diagnoses     Vitamin D deficiency       History of low levels, check today and start supplement as needed.   Relevant Orders   VITAMIN D 25 Hydroxy (Vit-D Deficiency, Fractures)   Benign prostatic hyperplasia without lower urinary tract symptoms       PSA on labs today.   Relevant Orders   PSA   Colon cancer screening       Referral to [facility] GI per request.   Relevant Orders   Ambulatory referral to Gastroenterology        Follow up plan: Return in about 6 months (around 10/24/2022) for T2DM, HTN/HLD, MOOD.

## 2022-04-25 NOTE — Assessment & Plan Note (Addendum)
Chronic, stable with BP well at goal today.  Continue current medications and adjust as needed.  Recommend he monitor BP at least a few mornings a week at home and document for provider visits.  DASH diet at home. Labs: CMP, CBC, TSH.  Continue collaboration with [facility] cardiology.  Return in 6 months.

## 2022-04-25 NOTE — Assessment & Plan Note (Signed)
Chronic, ongoing.  At this time continue collaboration with psychiatry and current medication regimen as prescribed by them.

## 2022-04-25 NOTE — Telephone Encounter (Signed)
Placed in providers folder for signature.  

## 2022-04-25 NOTE — Assessment & Plan Note (Signed)
Chronic, stable with A1c well below goal with weight loss today at 4.9% -- PRAISED FOR THIS, and urine ALB 10, A:C 30-300 last check.  Stop Metformin at this time and continue Jardiance only, if elevations in sugars at home he will alert provider.  Continue Gabapentin for neuropathy pain and dose renally as needed.  At this time recommend he monitor BS daily and document for visits + focus on diabetic diet. Return in 6 months for follow-up.

## 2022-04-25 NOTE — Assessment & Plan Note (Signed)
Stable.  Continue collaboration with neurosurgery, fusion in June 2022.

## 2022-04-26 ENCOUNTER — Telehealth: Payer: Self-pay | Admitting: Nurse Practitioner

## 2022-04-26 LAB — COMPREHENSIVE METABOLIC PANEL
ALT: 28 IU/L (ref 0–44)
AST: 30 IU/L (ref 0–40)
Albumin/Globulin Ratio: 1.8 (ref 1.2–2.2)
Albumin: 4.6 g/dL (ref 3.9–4.9)
Alkaline Phosphatase: 115 IU/L (ref 44–121)
BUN/Creatinine Ratio: 18 (ref 10–24)
BUN: 20 mg/dL (ref 8–27)
Bilirubin Total: 0.4 mg/dL (ref 0.0–1.2)
CO2: 22 mmol/L (ref 20–29)
Calcium: 9.8 mg/dL (ref 8.6–10.2)
Chloride: 94 mmol/L — ABNORMAL LOW (ref 96–106)
Creatinine, Ser: 1.14 mg/dL (ref 0.76–1.27)
Globulin, Total: 2.5 g/dL (ref 1.5–4.5)
Glucose: 113 mg/dL — ABNORMAL HIGH (ref 70–99)
Potassium: 4.7 mmol/L (ref 3.5–5.2)
Sodium: 133 mmol/L — ABNORMAL LOW (ref 134–144)
Total Protein: 7.1 g/dL (ref 6.0–8.5)
eGFR: 73 mL/min/{1.73_m2} (ref >59)

## 2022-04-26 LAB — CBC WITH DIFFERENTIAL/PLATELET
Basophils Absolute: 0.1 10*3/uL (ref 0.0–0.2)
Basos: 1 %
EOS (ABSOLUTE): 0.1 10*3/uL (ref 0.0–0.4)
Eos: 2 %
Hematocrit: 41.4 % (ref 37.5–51.0)
Hemoglobin: 13.4 g/dL (ref 13.0–17.7)
Immature Grans (Abs): 0 10*3/uL (ref 0.0–0.1)
Immature Granulocytes: 0 %
Lymphocytes Absolute: 1.9 10*3/uL (ref 0.7–3.1)
Lymphs: 26 %
MCH: 32.2 pg (ref 26.6–33.0)
MCHC: 32.4 g/dL (ref 31.5–35.7)
MCV: 100 fL — ABNORMAL HIGH (ref 79–97)
Monocytes Absolute: 1 10*3/uL — ABNORMAL HIGH (ref 0.1–0.9)
Monocytes: 14 %
Neutrophils Absolute: 4.1 10*3/uL (ref 1.4–7.0)
Neutrophils: 57 %
Platelets: 360 10*3/uL (ref 150–450)
RBC: 4.16 x10E6/uL (ref 4.14–5.80)
RDW: 12.2 % (ref 11.6–15.4)
WBC: 7.2 10*3/uL (ref 3.4–10.8)

## 2022-04-26 LAB — VITAMIN D 25 HYDROXY (VIT D DEFICIENCY, FRACTURES): Vit D, 25-Hydroxy: 83.5 ng/mL (ref 30.0–100.0)

## 2022-04-26 LAB — LIPID PANEL W/O CHOL/HDL RATIO
Cholesterol, Total: 162 mg/dL (ref 100–199)
HDL: 54 mg/dL (ref >39)
LDL Chol Calc (NIH): 91 mg/dL (ref 0–99)
Triglycerides: 90 mg/dL (ref 0–149)
VLDL Cholesterol Cal: 17 mg/dL (ref 5–40)

## 2022-04-26 LAB — PSA: Prostate Specific Ag, Serum: 1.7 ng/mL (ref 0.0–4.0)

## 2022-04-26 LAB — TSH: TSH: 2.54 u[IU]/mL (ref 0.450–4.500)

## 2022-04-26 NOTE — Telephone Encounter (Signed)
Form placed in [place]' folder for signatures

## 2022-04-26 NOTE — Progress Notes (Signed)
Contacted via MyChart   Good evening Derek Garza, your labs have returned: - Kidney function, creatinine and eGFR, remains normal, as is liver function, AST and ALT.  Sodium level remains a little low at 133, add just a little salt to diet daily as Lasix can make salt levels low. - CBC shows no anemia or infection. - Remainder of labs are all stable.  Continue all current medications.  Great job!! Any questions? Keep being wonderful!!  Thank you for allowing me to participate in your care.  I appreciate you. Kindest regards, Lancer Thurner

## 2022-04-26 NOTE — Telephone Encounter (Signed)
PT came in requesting another parking placard form.  He had accidentally circled the 6 on the field where it stated temporary months placard and the DM[employer] would not accept it with it being a legal document.  So he is asking for another one to be filled out.  Place in provider's folder.  Call when complete.

## 2022-04-26 NOTE — Telephone Encounter (Signed)
Paperwork signed , patient called . Paperwork at front desk ready for pick up

## 2022-04-27 NOTE — Telephone Encounter (Signed)
Patient aware, paperwork at front desk ready for pick up

## 2022-05-03 NOTE — Progress Notes (Unsigned)
Patient: Derek Garza Date of Birth: 01-24-1961  Reason for Visit: Follow up History from: Patient Primary Neurologist: Terrace [place]  ASSESSMENT AND PLAN 61 y.o. year old male   1.  Gait abnormality 2.  Worsening low back pain -EMG/NCV showed no evidence of large fiber peripheral neuropathy, mild chronic bilateral lumbar radiculopathy -MRI lumbar spine July 2023 showed multilevel degenerative changes, no significant canal stenosis, variable degree of foraminal narrowing, most pronounced on the right at L4-5 and left at L2-3. -CT angiogram June 2023 showed no evidence of large vessel disease -Recent A1c was 4.9 -Status post ACDF C3-4-5 with Dr. Danielle Dess June 2022 -Seems  to have stabilized since ACDF, has adjusted, no falls  -Follow-up in 1 year or sooner if needed, will check on his chronic conditions  DIAGNOSTIC DATA (LABS, IMAGING, TESTING) CT angiogram of the neck at [facility] on November 22, 2021 carotid atherosclerosis without significant focal stenosis, vertebral arteries are normal, degenerative and post surgical change of the cervical spine. Anterior fusion C3-6, no evidence of hardware failure, multilevel bony neuroforaminal stenosis, canal stenosis at C 5 6, C6-7   MRI of lumbar spine from December 01, 2021 at [facility], no misalignment, multilevel degenerative changes, no significant canal stenosis, variable degree of foraminal narrowing, most pronounced on the right at L4-5, and the left at L2-3, no evidence of nerve compression.    HISTORY  Derek Garza, XR 61 year old male, seen in request by his primary care nurse practitioner Aura Dials and Dr. Charlotta Newton, [name], for evaluation of tendency to fall, initial evaluation was on September 24, 2020.   I reviewed and summarized the referring note.  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, he continually had recurrent similar episode of sudden overwhelming weakness from head to toes, if he does not steady himself he will fall, eventually went out on short-term disability since June 04, 2020   He uses dizziness to describe his symptoms, on further questioning, he denies vertigo, almost all episodes happen in a standing position, he had sudden onset overwhelming weakness starting from his head traveling to his whole body, if he does not hold onto something, steady himself he would fall to the ground, this episode lasted about couple minutes, he denies loss of consciousness,   At baseline, he denies significant gait change, denies neck pain, denies bowel bladder incontinence,   Reviewed emergency room record, he presented on November 16, 2018 for fall CT cervical spine reviewed with patient, severe multilevel cervical spondylosis, worst at C4 7, disc osteophyte protruding into canal, causing variable degree of canal stenosis   Update March 23, 2021: He had MRI of cervical spine on Oct 07, 2020, personally reviewed the film, which showed evidence of multilevel degenerative changes, C5-6, severe spinal stenosis with severe bilateral foraminal stenosis, myelomalacia within the spinal cord, moderate spinal stenosis C4-5, severe  bilateral foraminal stenosis, C3-4 with severe foraminal stenosis  He underwent anterior ACDF C3-4-5 per patient by Dr. Danielle Dess  in June 2020, recovering well, continue have some stiff neck, but decrease the pain, continue has mild gait abnormality, lower extremity paresthesia, intermittent low back pain, denies bowel and bladder incontinence,  In addition, he complains of frequent dizziness, especially when he get up quickly from sitting position  Blood pressure/heart rate today, sitting down 134/81, 102; getting up 107/67, 116;, standing up from 1 minutes 104/62, 111  He is on long term disability, because his frequent dizziness, unsteady gait,  Reviewed laboratory evaluation in 2022: Normal B12, PSA, TSH, lipid panel, CBC, hemoglobin 14.4, CMP, creatinine of 0.94, A1c 6.8,   EMG nerve conduction study March 23, 2021 showed no large fiber peripheral neuropathy, chronic mild bilateral lumbosacral radiculopathy, no evidence of active process.  Above findings could not rule out possibility of small fiber neuropathy   UPDATE Oct 26 2021: He continued to complaint of unsteady gait, especially with sudden positional turning, bilateral lower extremity paresthesia, limited range of motion of his neck,  He has low back pain, radiating pain to right lower extremity  MRI in 2011 at [facility] system reported to multilevel degenerative changes, left paracentral small herniated disc at L2-3, compressing the left ventro lateral aspect of the thecal sac, with mild central canal stenosis, broad-based, right paracentral disc herniation with disc extruded inferiorly from L4-5, mild central canal stenosis, impinge upon the descending right nerve roots at L4-5   He denies significant upper extremity numbness, weakness, has urinary urgency,   He also complains of lightheaded, unsteadiness sensation when getting up from seated position,   Personally reviewed MRI of the brain with and without contrast March 2022, mild small vessel disease, no acute abnormality.  Laboratory in 23, normal CBC, TSH, RPR, ANA, ferritin, C-reactive protein,  copper, no M protein spike, CMP showed normal creatinine 1.07, lipid panel LDL 74  Update May 04, 2022 SS: Low back pain is variable,  radiates down left leg occasionally. No falls, but he is cautious. Is now on social security long term disability. Dizziness is better, he is mindful. Last A1C 4.9 04/25/22.  No longer rides his motorcycle due to poor feeling in his feet from neuropathy.  He is weaker over time, but continues independently with awareness of his limitations.  He has no real complaints today.  History of bipolar disorder is on Tegretol XR and Seroquel.  REVIEW OF SYSTEMS: Out of a complete 14 system review of symptoms, the patient complains only of the following symptoms, and all other reviewed systems are negative.  See HPI  ALLERGIES: Allergies  Allergen Reactions   Acetaminophen    Percocet [Oxycodone-Acetaminophen] Nausea Only    HOME MEDICATIONS: Outpatient Medications Prior to Visit  Medication Sig Dispense Refill   atorvastatin (LIPITOR) 10 MG tablet TAKE 1 TABLET BY MOUTH EVERY DAY 90 tablet 1   benazepril (LOTENSIN) 40 MG tablet Take 20 mg by mouth daily.     carbamazepine (TEGRETOL XR) 200 MG 12 hr tablet 200 mg 2 (two) times daily.     cyclobenzaprine (FLEXERIL) 10 MG tablet take 1 tablet by oral route 3 times a day as needed for mscle spasm     empagliflozin (JARDIANCE) 25 MG TABS tablet Take 1 tablet (25 mg total) by mouth daily. 90 tablet 4   gabapentin (NEURONTIN) 600 MG tablet Take 1 tablet (600 mg total) by mouth 2 (two) times daily. 180 tablet 4  potassium chloride (KLOR-CON) 10 MEQ tablet Take 1 tablet (10 mEq total) by mouth daily. 90 tablet 4   QUEtiapine (SEROQUEL) 400 MG tablet Take 400 mg by mouth daily.      tadalafil (CIALIS) 20 MG tablet Take 1 tablet (20 mg total) by mouth daily as needed for erectile dysfunction. 100 tablet 0   furosemide (LASIX) 80 MG tablet Take 80 mg by mouth daily as needed.     No facility-administered medications  prior to visit.    PAST MEDICAL HISTORY: Past Medical History:  Diagnosis Date   Diabetes mellitus without complication (HCC)    Hypertension     PAST SURGICAL HISTORY: Past Surgical History:  Procedure Laterality Date   ANKLE SURGERY  05/29/1977   APPENDECTOMY  05/29/2009   CERVICAL SPINE SURGERY     HERNIA REPAIR  [phone]   SPINE SURGERY  10/2018   VASECTOMY      FAMILY HISTORY: Family History  Problem Relation Age of Onset   Breast cancer Sister     SOCIAL HISTORY: Social History   Socioeconomic History   Marital status: Divorced    Spouse name: Not on file   Number of children: Not on file   Years of education: Not on file   Highest education level: Not on file  Occupational History   Occupation: on [employer]  Tobacco Use   Smoking status: Never   Smokeless tobacco: Never  Vaping Use   Vaping Use: Never used  Substance and Sexual Activity   Alcohol use: Yes    Comment: on occasion   Drug use: No   Sexual activity: Not Currently  Other Topics Concern   Not on file  Social History Narrative   Lives alone   Right Handed   Drinks 2 liter of soda daily   Social Determinants of Health   Financial Resource Strain: Not on file  Food Insecurity: Not on file  Transportation Needs: Not on file  Physical Activity: Not on file  Stress: Not on file  Social Connections: Not on file  Intimate Partner Violence: Not on file    PHYSICAL EXAM  Vitals:   05/04/22 0921  BP: (!) 93/56  Pulse: 99  Weight: 190 lb (86.2 kg)  Height: 5\' 10"  (1.778 m)   Body mass index is 27.26 kg/m.  Generalized: Well developed, in no acute distress  Neurological examination  Mentation: Alert oriented to time, place, history taking. Follows all commands speech and language fluent Cranial nerve II-XII: Pupils were equal round reactive to light. Extraocular movements were full, visual field were full on confrontational test. Facial sensation and strength were normal.. Head turning  and shoulder shrug  were normal and symmetric. Motor: The motor testing reveals 5 over 5 strength of all 4 extremities. Good symmetric motor tone is noted throughout.  No significant weakness was noted. Sensory: Length dependent decreased sensation to light touch to mid shin Coordination: Cerebellar testing reveals good finger-nose-finger and heel-to-shin bilaterally.  Gait and station: Gait is normal. Tandem gait is unsteady. Reflexes: Deep tendon reflexes are symmetric and normal bilaterally.   Lab Results  Component Value Date   WBC 7.2 04/25/2022   HGB 13.4 04/25/2022   HCT 41.4 04/25/2022   MCV 100 (H) 04/25/2022   PLT 360 04/25/2022      Component Value Date/Time   NA 133 (L) 04/25/2022 0829   NA 142 06/09/2012 2124   K 4.7 04/25/2022 0829   K 3.7 06/09/2012 2124   CL 94 (L)  04/25/2022 0829   CL 108 (H) 06/09/2012 2124   CO2 22 04/25/2022 0829   CO2 26 06/09/2012 2124   GLUCOSE 113 (H) 04/25/2022 0829   GLUCOSE 89 06/09/2012 2124   BUN 20 04/25/2022 0829   BUN 11 06/09/2012 2124   CREATININE 1.14 04/25/2022 0829   CREATININE 1.02 06/09/2012 2124   CALCIUM 9.8 04/25/2022 0829   CALCIUM 8.3 (L) 06/09/2012 2124   PROT 7.1 04/25/2022 0829   PROT 6.4 06/09/2012 2124   ALBUMIN 4.6 04/25/2022 0829   ALBUMIN 3.5 06/09/2012 2124   AST 30 04/25/2022 0829   AST 19 05/14/2018 0912   AST 16 06/09/2012 2124   ALT 28 04/25/2022 0829   ALT 22 05/14/2018 0912   ALT 22 06/09/2012 2124   ALKPHOS 115 04/25/2022 0829   ALKPHOS 73 06/09/2012 2124   BILITOT 0.4 04/25/2022 0829   BILITOT 0.5 06/09/2012 2124   GFRNONAA 78 06/29/2020 0844   GFRNONAA >60 06/09/2012 2124   GFRAA 90 06/29/2020 0844   GFRAA >60 06/09/2012 2124   Lab Results  Component Value Date   CHOL 162 04/25/2022   HDL 54 04/25/2022   LDLCALC 91 04/25/2022   TRIG 90 04/25/2022   CHOLHDL 3.0 11/12/2018   Lab Results  Component Value Date   HGBA1C 4.9 04/25/2022   Lab Results  Component Value Date    VITAMINB12 >2000 (H) 12/03/2020   Lab Results  Component Value Date   TSH 2.540 04/25/2022    Margie Ege, AGNP-C, DNP 05/04/2022, 10:08 AM [facility] [address] [place], [place] [address] [phone]

## 2022-05-04 ENCOUNTER — Ambulatory Visit: Payer: BLUE CROSS/BLUE SHIELD | Admitting: Neurology

## 2022-05-04 ENCOUNTER — Encounter: Payer: Self-pay | Admitting: Neurology

## 2022-05-04 VITALS — BP 93/56 | HR 99 | Ht 70.0 in | Wt 190.0 lb

## 2022-05-04 DIAGNOSIS — M47816 Spondylosis without myelopathy or radiculopathy, lumbar region: Secondary | ICD-10-CM

## 2022-05-04 DIAGNOSIS — R269 Unspecified abnormalities of gait and mobility: Secondary | ICD-10-CM

## 2022-05-04 DIAGNOSIS — G959 Disease of spinal cord, unspecified: Secondary | ICD-10-CM | POA: Diagnosis not present

## 2022-06-12 ENCOUNTER — Other Ambulatory Visit: Payer: Self-pay | Admitting: Nurse Practitioner

## 2022-06-12 ENCOUNTER — Other Ambulatory Visit: Payer: Self-pay

## 2022-06-12 MED ORDER — EMPAGLIFLOZIN 25 MG PO TABS: 25.0000 mg | ORAL_TABLET | Freq: Every day | ORAL | 4 refills | Status: DC

## 2022-06-12 NOTE — Telephone Encounter (Signed)
Medication refill for Jardiance last ov 04/25/22, upcoming ov 10/25/22 . Please advise

## 2022-06-26 ENCOUNTER — Telehealth: Payer: Self-pay

## 2022-06-26 NOTE — Telephone Encounter (Signed)
Paperwork faxed back to [facility]

## 2022-07-24 IMAGING — MR MR HEAD WO/W CM
14 series · 48 of 48 positions shown · IV contrast (gadavist)
Comparison: Head CT November 15, 2018

CLINICAL DATA: Dizziness. Mild resting tremor of the right hand and
mild cogwheel left arm.

EXAM:
MRI HEAD WITHOUT AND WITH CONTRAST
TECHNIQUE: Multiplanar, multiecho pulse sequences of the brain and surrounding
structures were obtained without and with intravenous contrast.
CONTRAST:  10mL GADAVIST GADOBUTROL 1 MMOL/ML IV SOLN

[Series 5: ax dwi_tracew · axial · 3.0mm · 0.65mm/px · z∈[-105,+49]mm · 4 of 48 slices shown]
[im 1/48]
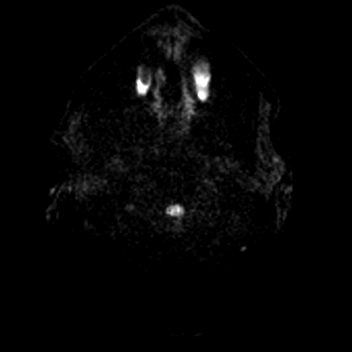
[im 16/48]
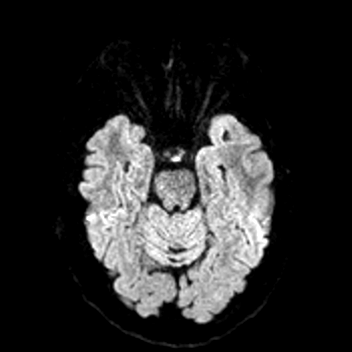
[im 32/48]
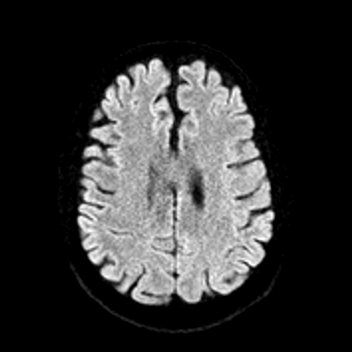
[im 48/48]
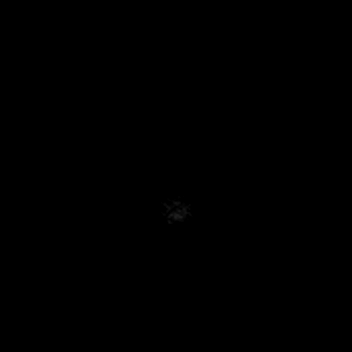

[Series 6: ax dwi_adc · axial · 3.0mm · 0.65mm/px · z∈[-105,+46]mm · 3 of 47 slices shown]
[im 1/47]
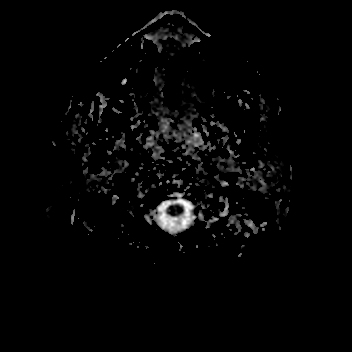
[im 24/47]
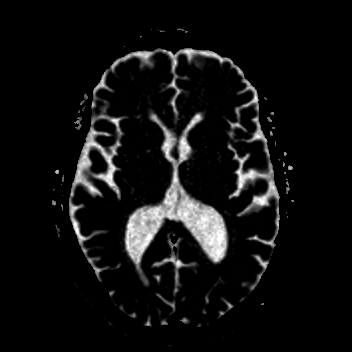
[im 47/47]
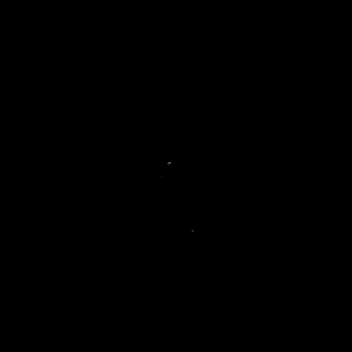

[Series 7: cor dwi_tracew · coronal · 5.0mm · 0.65mm/px · 2 of 40 slices shown]
[im 1/40]
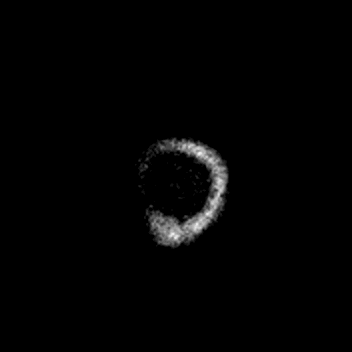
[im 40/40]
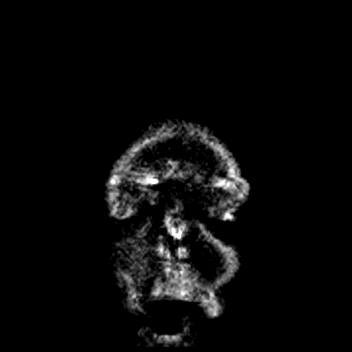

[Series 8: cor dwi_adc · coronal · 5.0mm · 0.65mm/px · 2 of 40 slices shown]
[im 1/40]
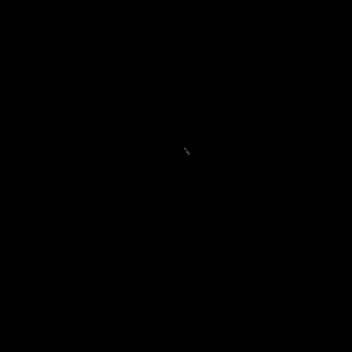
[im 40/40]
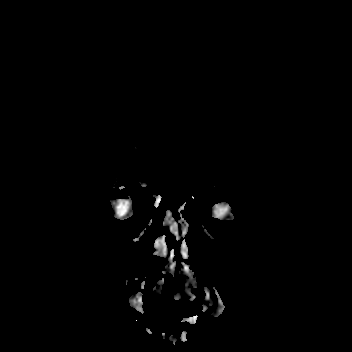

[Series 9: T1 · sagittal · 5.0mm · 0.62mm/px · 1 of 23 slices shown (1 of 2)]
[im 1/23]
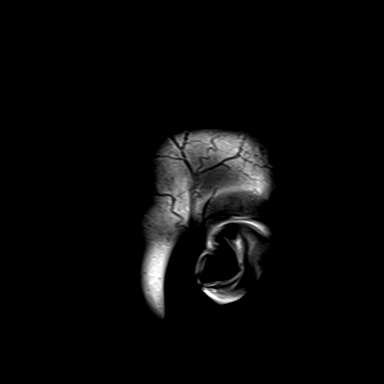

[Series 10: T2 · axial · 5.0mm · 0.53mm/px · z∈[-105,+49]mm · 2 of 27 slices shown]
[im 1/27]
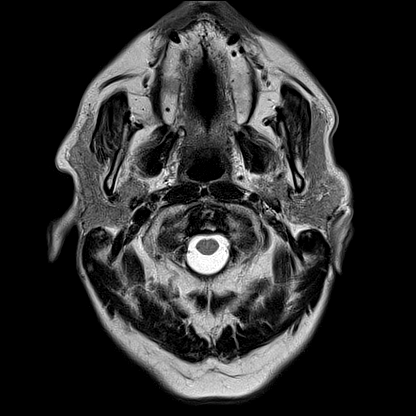
[im 27/27]
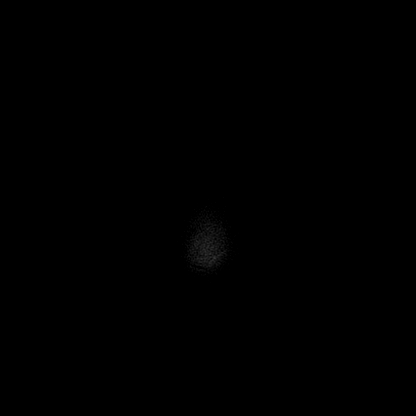

[Series 11: mag_images · axial · 3.0mm · 0.90mm/px · z∈[-114,+61]mm · 3 of 60 slices shown]
[im 1/60]
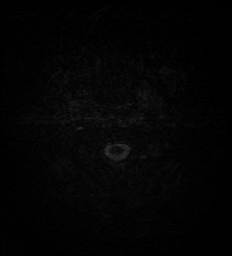
[im 30/60]
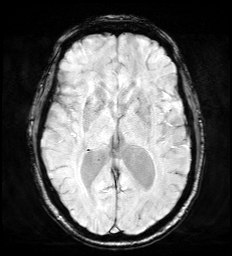
[im 60/60]
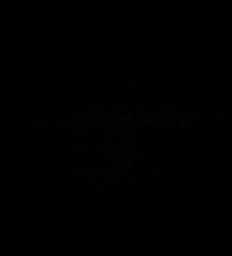

[Series 12: pha_images · axial · 3.0mm · 0.90mm/px · z∈[-111,+52]mm · 3 of 56 slices shown]
[im 1/56]
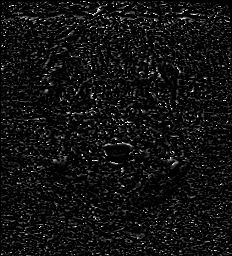
[im 28/56]
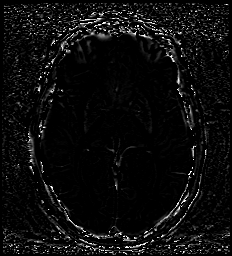
[im 56/56]
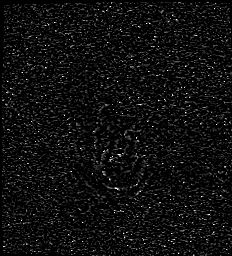

[Series 13: swi_images · axial · 3.0mm · 0.90mm/px · z∈[-114,+61]mm · 3 of 60 slices shown]
[im 1/60]
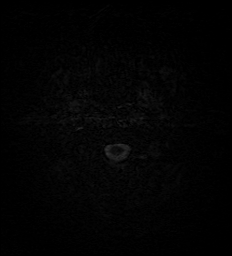
[im 30/60]
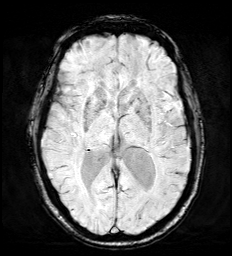
[im 60/60]
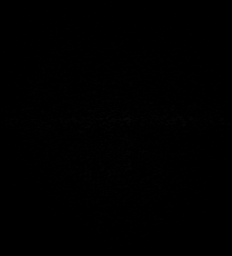

[Series 15: FLAIR · axial · 3.0mm · 0.53mm/px · z∈[-108,+52]mm · 3 of 55 slices shown]
[im 1/55]
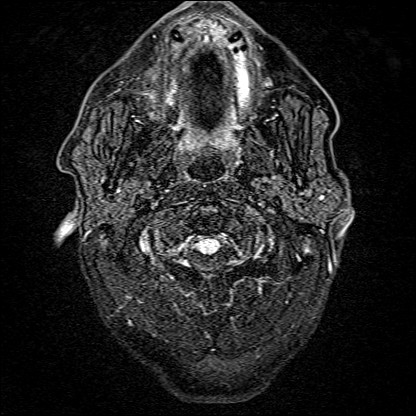
[im 28/55]
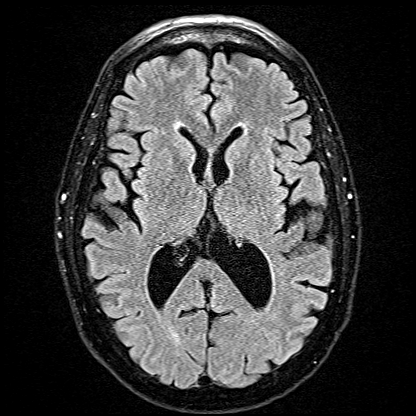
[im 55/55]
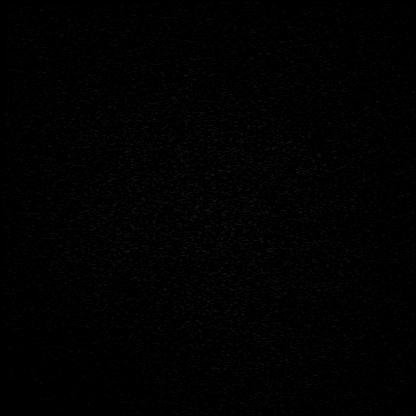

[Series 16: T1 · axial · 1.0mm · 0.98mm/px · z∈[-106,+51]mm · 9 of 160 slices shown (2 of 2)]
[im 1/160]
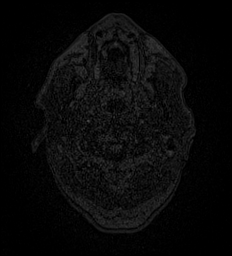
[im 20/160]
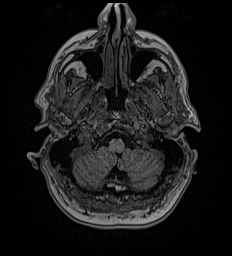
[im 40/160]
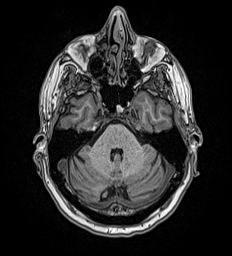
[im 60/160]
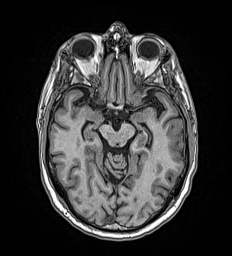
[im 80/160]
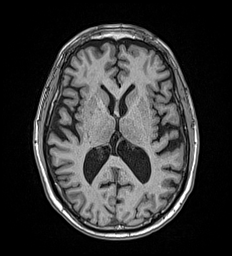
[im 100/160]
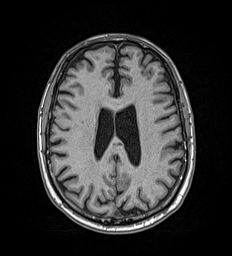
[im 120/160]
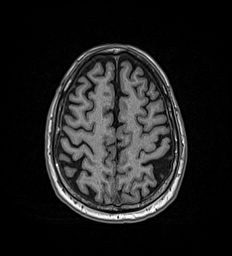
[im 140/160]
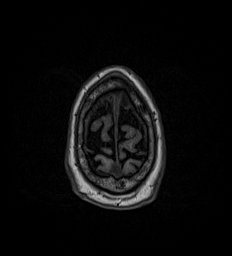
[im 160/160]
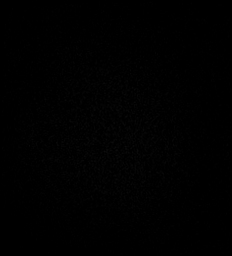

[Series 17: T2 post-contrast · coronal · 5.0mm · 0.57mm/px · 2 of 30 slices shown]
[im 1/30]
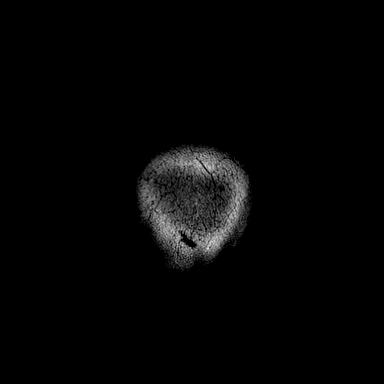
[im 30/30]
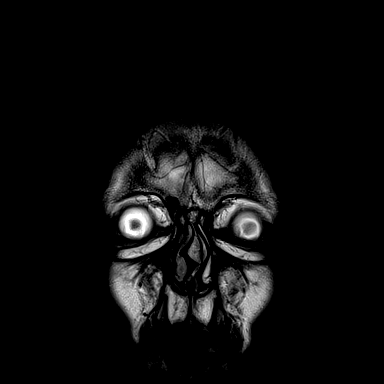

[Series 18: T1 post-contrast · axial · 1.0mm · 0.98mm/px · z∈[-106,+51]mm · 9 of 160 slices shown (1 of 2)]
[im 1/160]
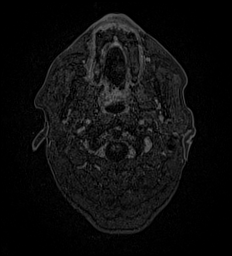
[im 20/160]
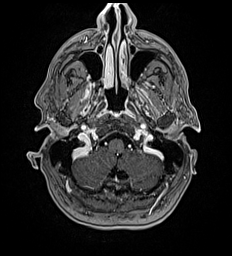
[im 40/160]
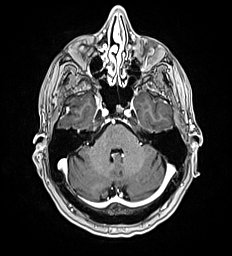
[im 60/160]
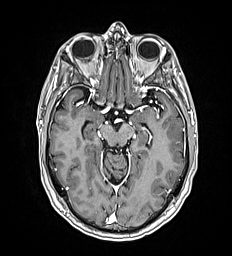
[im 80/160]
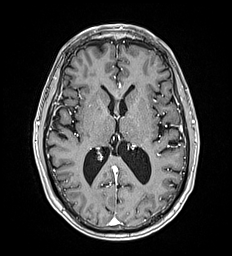
[im 100/160]
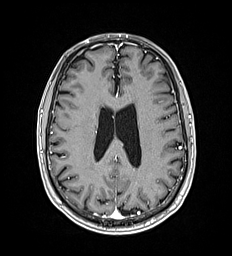
[im 120/160]
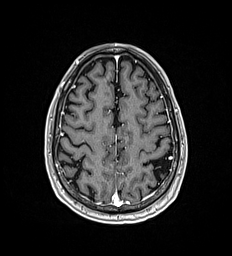
[im 140/160]
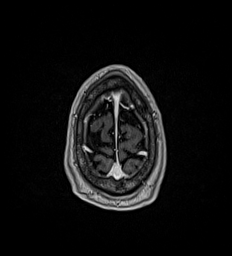
[im 160/160]
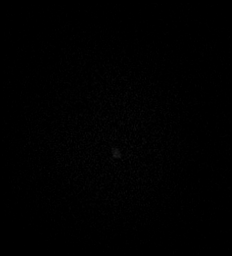

[Series 19: T1 post-contrast · coronal · 5.0mm · 0.57mm/px · 2 of 30 slices shown (2 of 2)]
[im 1/30]
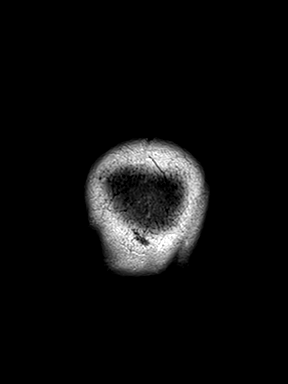
[im 30/30]
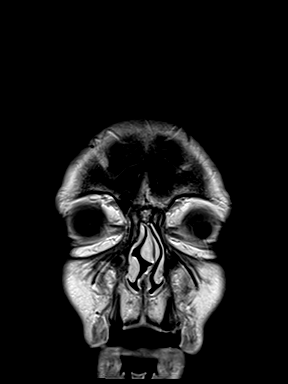

[48 of 48 positions shown; findings below may reference images not displayed]

FINDINGS: Brain: No acute infarction, hemorrhage, hydrocephalus, extra-axial
collection or mass lesion. Rare foci of T2 hyperintensity are seen
within the white matter of the cerebral hemispheres, nonspecific,
most likely related to early chronic microvascular ischemic changes.
No focus of abnormal contrast enhancement.

Vascular: Normal flow voids.

Skull and upper cervical spine: Normal marrow signal.

Sinuses/Orbits: Negative.
IMPRESSION: 1. No acute intracranial abnormality.
2. Rare foci of T2 hyperintensity within the white matter of the
cerebral hemispheres, nonspecific, most likely related to early
chronic microvascular ischemic changes.

## 2022-07-26 ENCOUNTER — Telehealth: Payer: Self-pay

## 2022-07-26 NOTE — Telephone Encounter (Signed)
Copied from [place] [phone]. Topic: Medical Record Request - Other >> Jul 26, 2022  9:57 AM [place] B wrote: Reason for CRM: mae from [place] called in says faxed med record request 02/16. Please call back if received.ref# is [id number]  and also are there med records for 08/23 to present.

## 2022-07-26 NOTE — Telephone Encounter (Signed)
Left message on patient voicemail and stated that the medical record request was received 07/17/2022 and sent out on 07/25/2022 to be processed by [facility]

## 2022-08-11 ENCOUNTER — Other Ambulatory Visit: Payer: Self-pay | Admitting: Nurse Practitioner

## 2022-08-11 NOTE — Telephone Encounter (Signed)
Requested by interface surescripts. Medication discontinued on 11/20/21 by provider.  Requested Prescriptions  Refused Prescriptions Disp Refills   furosemide (LASIX) 80 MG tablet [Pharmacy Med Name: FUROSEMIDE 80 MG TABLET] 45 tablet 1    Sig: TAKE 1/2 TABLET BY MOUTH EVERY DAY     Cardiovascular:  Diuretics - Loop Failed - 08/11/2022  2:15 PM      Failed - Na in normal range and within 180 days    Sodium  Date Value Ref Range Status  04/25/2022 133 (L) 134 - 144 mmol/L Final  06/09/2012 142 136 - 145 mmol/L Final         Failed - Cl in normal range and within 180 days    Chloride  Date Value Ref Range Status  04/25/2022 94 (L) 96 - 106 mmol/L Final  06/09/2012 108 (H) 98 - 107 mmol/L Final         Failed - Mg Level in normal range and within 180 days    No results found for: "MG"       Passed - K in normal range and within 180 days    Potassium  Date Value Ref Range Status  04/25/2022 4.7 3.5 - 5.2 mmol/L Final  06/09/2012 3.7 3.5 - 5.1 mmol/L Final         Passed - Ca in normal range and within 180 days    Calcium  Date Value Ref Range Status  04/25/2022 9.8 8.6 - 10.2 mg/dL Final   Calcium, Total  Date Value Ref Range Status  06/09/2012 8.3 (L) 8.5 - 10.1 mg/dL Final         Passed - Cr in normal range and within 180 days    Creatinine  Date Value Ref Range Status  06/09/2012 1.02 0.60 - 1.30 mg/dL Final   Creatinine, Ser  Date Value Ref Range Status  04/25/2022 1.14 0.76 - 1.27 mg/dL Final         Passed - Last BP in normal range    BP Readings from Last 1 Encounters:  05/04/22 (!) 93/56         Passed - Valid encounter within last 6 months    Recent Outpatient Visits           3 months ago DM type 2 with diabetic peripheral neuropathy ([place])   [place], [place] T, NP   11 months ago DM type 2 with diabetic peripheral neuropathy ([place])   [place], Henrine Screws T, NP   1 year ago DM type  2 with diabetic peripheral neuropathy ([place])   [place], Henrine Screws T, NP   1 year ago Abrasion of finger of right hand, initial encounter   [place], Karen, NP   1 year ago DM type 2 with diabetic peripheral neuropathy ([place])   [place], Barbaraann Faster, NP       Future Appointments             In 2 months [place], Barbaraann Faster, NP [place]

## 2022-08-14 ENCOUNTER — Encounter: Payer: Self-pay | Admitting: Nurse Practitioner

## 2022-08-14 ENCOUNTER — Other Ambulatory Visit: Payer: Self-pay | Admitting: Nurse Practitioner

## 2022-08-14 ENCOUNTER — Ambulatory Visit (INDEPENDENT_AMBULATORY_CARE_PROVIDER_SITE_OTHER): Payer: BLUE CROSS/BLUE SHIELD | Admitting: Nurse Practitioner

## 2022-08-14 VITALS — BP 126/74 | HR 77 | Temp 97.6°F | Ht 70.0 in | Wt 183.6 lb

## 2022-08-14 DIAGNOSIS — I152 Hypertension secondary to endocrine disorders: Secondary | ICD-10-CM

## 2022-08-14 DIAGNOSIS — E1159 Type 2 diabetes mellitus with other circulatory complications: Secondary | ICD-10-CM

## 2022-08-14 MED ORDER — FUROSEMIDE 80 MG PO TABS: 80.0000 mg | ORAL_TABLET | Freq: Every day | ORAL | 0 refills | Status: DC

## 2022-08-14 MED ORDER — GABAPENTIN 600 MG PO TABS: 600.0000 mg | ORAL_TABLET | Freq: Two times a day (BID) | ORAL | 0 refills | Status: DC

## 2022-08-14 MED ORDER — GABAPENTIN 600 MG PO TABS: 600.0000 mg | ORAL_TABLET | Freq: Two times a day (BID) | ORAL | 4 refills | Status: DC

## 2022-08-14 NOTE — Assessment & Plan Note (Signed)
Chronic. Well controlled.  Patient states he has been on lasix and never stopped taking it.  He was taking 40mg  but didn't feel like it was helping him urinate properly.  Will increase dose back to 80mg  since he has been on this in the past.  Follow up in 2 months with PCP.

## 2022-08-14 NOTE — Telephone Encounter (Signed)
Medication refill for Gabapentin 600 mg last ov 04/25/22, upcoming ov 08/14/22 . Please advise

## 2022-08-14 NOTE — Progress Notes (Signed)
BP 126/74   Pulse 77   Temp 97.6 F (36.4 C) (Oral)   Ht 5\' 10"  (1.778 m)   Wt 183 lb 9.6 oz (83.3 kg)   SpO2 93%   BMI 26.34 kg/m    Subjective:    Patient ID: Derek Garza, male    DOB: 30-Mar-1961, 62 y.o.   MRN: [id number]  HPI: Derek Garza is a 62 y.o. male  Chief Complaint  Patient presents with   Medication Refill   Patient states he has been taking the Lasix all along.  Patient states he was taking 80mg  of Lasix, it was decreased to 40mg  but that doesn't work for him.  Patient states he when he was taking Lasix 40mg  it decreased his urine.  He was having to go more often instead of just getting it all out at once.    Relevant past medical, surgical, family and social history reviewed and updated as indicated. Interim medical history since our last visit reviewed. Allergies and medications reviewed and updated.  Review of Systems  Genitourinary:  Positive for difficulty urinating.    Per HPI unless specifically indicated above     Objective:    BP 126/74   Pulse 77   Temp 97.6 F (36.4 C) (Oral)   Ht 5\' 10"  (1.778 m)   Wt 183 lb 9.6 oz (83.3 kg)   SpO2 93%   BMI 26.34 kg/m   Wt Readings from Last 3 Encounters:  08/14/22 183 lb 9.6 oz (83.3 kg)  05/04/22 190 lb (86.2 kg)  04/25/22 189 lb 1.6 oz (85.8 kg)    Physical Exam Vitals and nursing note reviewed.  Constitutional:      General: He is not in acute distress.    Appearance: Normal appearance. He is not ill-appearing, toxic-appearing or diaphoretic.  HENT:     Head: Normocephalic.     Right Ear: External ear normal.     Left Ear: External ear normal.     Nose: Nose normal. No congestion or rhinorrhea.     Mouth/Throat:     Mouth: Mucous membranes are moist.  Eyes:     General:        Right eye: No discharge.        Left eye: No discharge.     Extraocular Movements: Extraocular movements intact.     Conjunctiva/sclera: Conjunctivae normal.     Pupils: Pupils are equal, round, and  reactive to light.  Cardiovascular:     Rate and Rhythm: Normal rate and regular rhythm.     Heart sounds: No murmur heard. Pulmonary:     Effort: Pulmonary effort is normal. No respiratory distress.     Breath sounds: Normal breath sounds. No wheezing, rhonchi or rales.  Abdominal:     General: Abdomen is flat. Bowel sounds are normal.  Musculoskeletal:     Cervical back: Normal range of motion and neck supple.  Skin:    General: Skin is warm and dry.     Capillary Refill: Capillary refill takes less than 2 seconds.  Neurological:     General: No focal deficit present.     Mental Status: He is alert and oriented to person, place, and time.  Psychiatric:        Mood and Affect: Mood normal.        Behavior: Behavior normal.        Thought Content: Thought content normal.        Judgment: Judgment normal.  Results for orders placed or performed in visit on 04/25/22  Bayer DCA Hb A1c Waived  Result Value Ref Range   HB A1C (BAYER DCA - WAIVED) 4.9 4.8 - 5.6 %  CBC with Differential/Platelet  Result Value Ref Range   WBC 7.2 3.4 - 10.8 x10E3/uL   RBC 4.16 4.14 - 5.80 x10E6/uL   Hemoglobin 13.4 13.0 - 17.7 g/dL   Hematocrit 41.4 37.5 - 51.0 %   MCV 100 (H) 79 - 97 fL   MCH 32.2 26.6 - 33.0 pg   MCHC 32.4 31.5 - 35.7 g/dL   RDW 12.2 11.6 - 15.4 %   Platelets 360 150 - 450 x10E3/uL   Neutrophils 57 Not Estab. %   Lymphs 26 Not Estab. %   Monocytes 14 Not Estab. %   Eos 2 Not Estab. %   Basos 1 Not Estab. %   Neutrophils Absolute 4.1 1.4 - 7.0 x10E3/uL   Lymphocytes Absolute 1.9 0.7 - 3.1 x10E3/uL   Monocytes Absolute 1.0 (H) 0.1 - 0.9 x10E3/uL   EOS (ABSOLUTE) 0.1 0.0 - 0.4 x10E3/uL   Basophils Absolute 0.1 0.0 - 0.2 x10E3/uL   Immature Granulocytes 0 Not Estab. %   Immature Grans (Abs) 0.0 0.0 - 0.1 x10E3/uL  Comprehensive metabolic panel  Result Value Ref Range   Glucose 113 (H) 70 - 99 mg/dL   BUN 20 8 - 27 mg/dL   Creatinine, Ser 1.14 0.76 - 1.27 mg/dL   eGFR  73 >59 mL/min/1.73   BUN/Creatinine Ratio 18 10 - 24   Sodium 133 (L) 134 - 144 mmol/L   Potassium 4.7 3.5 - 5.2 mmol/L   Chloride 94 (L) 96 - 106 mmol/L   CO2 22 20 - 29 mmol/L   Calcium 9.8 8.6 - 10.2 mg/dL   Total Protein 7.1 6.0 - 8.5 g/dL   Albumin 4.6 3.9 - 4.9 g/dL   Globulin, Total 2.5 1.5 - 4.5 g/dL   Albumin/Globulin Ratio 1.8 1.2 - 2.2   Bilirubin Total 0.4 0.0 - 1.2 mg/dL   Alkaline Phosphatase 115 44 - 121 IU/L   AST 30 0 - 40 IU/L   ALT 28 0 - 44 IU/L  Lipid Panel w/o Chol/HDL Ratio  Result Value Ref Range   Cholesterol, Total 162 100 - 199 mg/dL   Triglycerides 90 0 - 149 mg/dL   HDL 54 >39 mg/dL   VLDL Cholesterol Cal 17 5 - 40 mg/dL   LDL Chol Calc (NIH) 91 0 - 99 mg/dL  TSH  Result Value Ref Range   TSH 2.540 0.450 - 4.500 uIU/mL  VITAMIN D 25 Hydroxy (Vit-D Deficiency, Fractures)  Result Value Ref Range   Vit D, 25-Hydroxy 83.5 30.0 - 100.0 ng/mL  PSA  Result Value Ref Range   Prostate Specific Ag, Serum 1.7 0.0 - 4.0 ng/mL      Assessment & Plan:   Problem List Items Addressed This Visit       Cardiovascular and Mediastinum   Hypertension associated with diabetes ([place]) - Primary    Chronic. Well controlled.  Patient states he has been on lasix and never stopped taking it.  He was taking 40mg  but didn't feel like it was helping him urinate properly.  Will increase dose back to 80mg  since he has been on this in the past.  Follow up in 2 months with PCP.      Relevant Medications   furosemide (LASIX) 80 MG tablet     Follow up plan: Return  if symptoms worsen or fail to improve.

## 2022-08-15 NOTE — Telephone Encounter (Signed)
Medication no longer on current medication list Requested Prescriptions  Pending Prescriptions Disp Refills   metFORMIN (GLUCOPHAGE) 500 MG tablet [Pharmacy Med Name: METFORMIN HCL 500 MG TABLET] 180 tablet 4    Sig: TAKE 1 TABLET BY MOUTH 2 TIMES DAILY WITH A MEAL.     Endocrinology:  Diabetes - Biguanides Failed - 08/14/2022  9:15 PM      Failed - B12 Level in normal range and within 720 days    Vitamin B-12  Date Value Ref Range Status  12/03/2020 >2000 (H) 232 - 1245 pg/mL Final         Passed - Cr in normal range and within 360 days    Creatinine  Date Value Ref Range Status  06/09/2012 1.02 0.60 - 1.30 mg/dL Final   Creatinine, Ser  Date Value Ref Range Status  04/25/2022 1.14 0.76 - 1.27 mg/dL Final         Passed - HBA1C is between 0 and 7.9 and within 180 days    Hemoglobin A1C  Date Value Ref Range Status  01/10/2016 7.3  Final   HB A1C (BAYER DCA - WAIVED)  Date Value Ref Range Status  04/25/2022 4.9 4.8 - 5.6 % Final    Comment:             Prediabetes: 5.7 - 6.4          Diabetes: >6.4          Glycemic control for adults with diabetes: <7.0          Passed - eGFR in normal range and within 360 days    EGFR (African American)  Date Value Ref Range Status  06/09/2012 >60  Final   GFR calc Af Amer  Date Value Ref Range Status  06/29/2020 90 >59 mL/min/1.73 Final    Comment:    **In accordance with recommendations from the NKF-ASN Task force,**   [facility] is in the process of updating its eGFR calculation to the   2021 CKD-EPI creatinine equation that estimates kidney function   without a race variable.    EGFR (Non-African Amer.)  Date Value Ref Range Status  06/09/2012 >60  Final    Comment:    eGFR values <63mL/min/1.73 m2 may be an indication of chronic kidney disease (CKD). Calculated eGFR is useful in patients with stable renal function. The eGFR calculation will not be reliable in acutely ill patients when serum creatinine is changing  rapidly. It is not useful in  patients on dialysis. The eGFR calculation may not be applicable to patients at the low and high extremes of body sizes, pregnant women, and vegetarians.    GFR calc non Af Amer  Date Value Ref Range Status  06/29/2020 78 >59 mL/min/1.73 Final   eGFR  Date Value Ref Range Status  04/25/2022 73 >59 mL/min/1.73 Final         Passed - Valid encounter within last 6 months    Recent Outpatient Visits           Yesterday Hypertension associated with diabetes [facility])   [place], Karen, NP   3 months ago DM type 2 with diabetic peripheral neuropathy ([place])   [place], Jolene T, NP   11 months ago DM type 2 with diabetic peripheral neuropathy ([place])   [place], Haugan T, NP   1 year ago DM type 2 with diabetic peripheral neuropathy ([place])   [facility] [place], Henrine Screws T, NP   1 year ago Abrasion of finger of right hand, initial encounter   [place], NP       Future Appointments             In 2 months [place], Barbaraann Faster, NP [place]            Passed - CBC within normal limits and completed in the last 12 months    WBC  Date Value Ref Range Status  04/25/2022 7.2 3.4 - 10.8 x10E3/uL Final  06/09/2012 6.7 3.8 - 10.6 x10 3/mm 3 Final   RBC  Date Value Ref Range Status  04/25/2022 4.16 4.14 - 5.80 x10E6/uL Final  06/09/2012 4.38 (L) 4.40 - 5.90 x10 6/mm 3 Final   Hemoglobin  Date Value Ref Range Status  04/25/2022 13.4 13.0 - 17.7 g/dL Final   Hematocrit  Date Value Ref Range Status  04/25/2022 41.4 37.5 - 51.0 % Final   MCHC  Date Value Ref Range Status  04/25/2022 32.4 31.5 - 35.7 g/dL Final  06/09/2012 32.6 32.0 - 36.0 g/dL Final   [facility]  Date Value Ref Range Status  04/25/2022 32.2 26.6 - 33.0 pg Final  06/09/2012 30.4 26.0  - 34.0 pg Final   MCV  Date Value Ref Range Status  04/25/2022 100 (H) 79 - 97 fL Final  06/09/2012 93 80 - 100 fL Final   No results found for: "PLTCOUNTKUC", "LABPLAT", "POCPLA" RDW  Date Value Ref Range Status  04/25/2022 12.2 11.6 - 15.4 % Final  06/09/2012 13.1 11.5 - 14.5 % Final

## 2022-09-19 ENCOUNTER — Other Ambulatory Visit: Payer: Self-pay | Admitting: Nurse Practitioner

## 2022-09-19 ENCOUNTER — Telehealth: Payer: Self-pay | Admitting: Nurse Practitioner

## 2022-09-19 MED ORDER — EMPAGLIFLOZIN 25 MG PO TABS: 25.0000 mg | ORAL_TABLET | Freq: Every day | ORAL | 4 refills | Status: DC

## 2022-09-19 NOTE — Telephone Encounter (Signed)
Pt is here to see if we could send his Rx empagliflozin (JARDIANCE) 25 MG can be sent to a different pharmacy and not to [facility] in [place], [place].  New Pharmacy needs to go [facility] and it needs to be faxed to [phone] and [phone] and/or [phone].  Pt stated that he has been getting it from this pharmacy for awhile.  Pt would like to be called once it has been called in.

## 2022-10-22 NOTE — Patient Instructions (Signed)
Be Involved in Your Health Care:  Taking Medications When medications are taken as directed, they can greatly improve your health. But if they are not taken as instructed, they may not work. In some cases, not taking them correctly can be harmful. To help ensure your treatment remains effective and safe, understand your medications and how to take them.  Your lab results, notes and after visit summary will be available on My Chart. We strongly encourage you to use this feature. If lab results are abnormal the clinic will contact you with the appropriate steps. If the clinic does not contact you assume the results are satisfactory. You can always see your results on My Chart. If you have questions regarding your condition, please contact the clinic during office hours. You can also ask questions on My Chart.  We at [facility] are grateful that you chose [place] to provide care. We strive to provide excellent and compassionate care and are always looking for feedback. If you get a survey from the clinic please complete this.   Diabetes Mellitus Basics  Diabetes mellitus, or diabetes, is a long-term (chronic) disease. It occurs when the body does not properly use sugar (glucose) that is released from food after you eat. Diabetes mellitus may be caused by one or both of these problems: Your pancreas does not make enough of a hormone called insulin. Your body does not react in a normal way to the insulin that it makes. Insulin lets glucose enter cells in your body. This gives you energy. If you have diabetes, glucose cannot get into cells. This causes high blood glucose (hyperglycemia). How to treat and manage diabetes You may need to take insulin or other diabetes medicines daily to keep your glucose in balance. If you are prescribed insulin, you will learn how to give yourself insulin by injection. You may need to adjust the amount of insulin you take based on the foods that you eat. You will  need to check your blood glucose levels using a glucose monitor as told by your health care provider. The readings can help determine if you have low or high blood glucose. Generally, you should have these blood glucose levels: Before meals (preprandial): 80-130 mg/dL (8.2-9.5 mmol/L). After meals (postprandial): below 180 mg/dL (10 mmol/L). Hemoglobin A1c (HbA1c) level: less than 7%. Your health care provider will set treatment goals for you. Keep all follow-up visits. This is important. Follow these instructions at home: Diabetes medicines Take your diabetes medicines every day as told by your health care provider. List your diabetes medicines here: Name of medicine: ______________________________ Amount (dose): _______________ Time (a.m./p.m.): _______________ Notes: ___________________________________ Name of medicine: ______________________________ Amount (dose): _______________ Time (a.m./p.m.): _______________ Notes: ___________________________________ Name of medicine: ______________________________ Amount (dose): _______________ Time (a.m./p.m.): _______________ Notes: ___________________________________ Insulin If you use insulin, list the types of insulin you use here: Insulin type: ______________________________ Amount (dose): _______________ Time (a.m./p.m.): _______________Notes: ___________________________________ Insulin type: ______________________________ Amount (dose): _______________ Time (a.m./p.m.): _______________ Notes: ___________________________________ Insulin type: ______________________________ Amount (dose): _______________ Time (a.m./p.m.): _______________ Notes: ___________________________________ Insulin type: ______________________________ Amount (dose): _______________ Time (a.m./p.m.): _______________ Notes: ___________________________________ Insulin type: ______________________________ Amount (dose): _______________ Time (a.m./p.m.): _______________  Notes: ___________________________________ Managing blood glucose  Check your blood glucose levels using a glucose monitor as told by your health care provider. Write down the times that you check your glucose levels here: Time: _______________ Notes: ___________________________________ Time: _______________ Notes: ___________________________________ Time: _______________ Notes: ___________________________________ Time: _______________ Notes: ___________________________________ Time: _______________ Notes: ___________________________________ Time: _______________ Notes: ___________________________________  Low blood glucose Low blood glucose (hypoglycemia) is when glucose is at or below 70 mg/dL (3.9 mmol/L). Symptoms may include: Feeling: Hungry. Sweaty and clammy. Irritable or easily upset. Dizzy. Sleepy. Having: A fast heartbeat. A headache. A change in your vision. Numbness around the mouth, lips, or tongue. Having trouble with: Moving (coordination). Sleeping. Treating low blood glucose To treat low blood glucose, eat or drink something containing sugar right away. If you can think clearly and swallow safely, follow the 15:15 rule: Take 15 grams of a fast-acting carb (carbohydrate), as told by your health care provider. Some fast-acting carbs are: Glucose tablets: take 3-4 tablets. Hard candy: eat 3-5 pieces. Fruit juice: drink 4 oz (120 mL). Regular (not diet) soda: drink 4-6 oz (120-180 mL). Honey or sugar: eat 1 Tbsp (15 mL). Check your blood glucose levels 15 minutes after you take the carb. If your glucose is still at or below 70 mg/dL (3.9 mmol/L), take 15 grams of a carb again. If your glucose does not go above 70 mg/dL (3.9 mmol/L) after 3 tries, get help right away. After your glucose goes back to normal, eat a meal or a snack within 1 hour. Treating very low blood glucose If your glucose is at or below 54 mg/dL (3 mmol/L), you have very low blood glucose  (severe hypoglycemia). This is an emergency. Do not wait to see if the symptoms will go away. Get medical help right away. Call your local emergency services (911 in the [place] Do not drive yourself to the hospital. Questions to ask your health care provider Should I talk with a diabetes educator? What equipment will I need to care for myself at home? What diabetes medicines do I need? When should I take them? How often do I need to check my blood glucose levels? What number can I call if I have questions? When is my follow-up visit? Where can I find a support group for people with diabetes? Where to find more information American Diabetes Association: www.diabetes.org Association of Diabetes Care and Education Specialists: www.diabeteseducator.org Contact a health care provider if: Your blood glucose is at or above 240 mg/dL (16.1 mmol/L) for 2 days in a row. You have been sick or have had a fever for 2 days or more, and you are not getting better. You have any of these problems for more than 6 hours: You cannot eat or drink. You feel nauseous. You vomit. You have diarrhea. Get help right away if: Your blood glucose is lower than 54 mg/dL (3 mmol/L). You get confused. You have trouble thinking clearly. You have trouble breathing. These symptoms may represent a serious problem that is an emergency. Do not wait to see if the symptoms will go away. Get medical help right away. Call your local emergency services (911 in the [place] Do not drive yourself to the hospital. Summary Diabetes mellitus is a chronic disease that occurs when the body does not properly use sugar (glucose) that is released from food after you eat. Take insulin and diabetes medicines as told. Check your blood glucose every day, as often as told. Keep all follow-up visits. This is important. This information is not intended to replace advice given to you by your health care provider. Make sure you discuss any  questions you have with your health care provider. Document Revised: 09/16/2019 Document Reviewed: 09/16/2019 Elsevier Patient Education  2024 [employer].

## 2022-10-25 ENCOUNTER — Ambulatory Visit (INDEPENDENT_AMBULATORY_CARE_PROVIDER_SITE_OTHER): Payer: BLUE CROSS/BLUE SHIELD | Admitting: Nurse Practitioner

## 2022-10-25 ENCOUNTER — Encounter: Payer: Self-pay | Admitting: Nurse Practitioner

## 2022-10-25 VITALS — BP 105/69 | HR 72 | Temp 97.9°F | Ht 70.0 in | Wt 181.8 lb

## 2022-10-25 DIAGNOSIS — Z1211 Encounter for screening for malignant neoplasm of colon: Secondary | ICD-10-CM

## 2022-10-25 DIAGNOSIS — E1142 Type 2 diabetes mellitus with diabetic polyneuropathy: Secondary | ICD-10-CM | POA: Diagnosis not present

## 2022-10-25 DIAGNOSIS — E785 Hyperlipidemia, unspecified: Secondary | ICD-10-CM

## 2022-10-25 DIAGNOSIS — F3181 Bipolar II disorder: Secondary | ICD-10-CM

## 2022-10-25 DIAGNOSIS — I152 Hypertension secondary to endocrine disorders: Secondary | ICD-10-CM

## 2022-10-25 DIAGNOSIS — E1159 Type 2 diabetes mellitus with other circulatory complications: Secondary | ICD-10-CM | POA: Diagnosis not present

## 2022-10-25 DIAGNOSIS — Z7984 Long term (current) use of oral hypoglycemic drugs: Secondary | ICD-10-CM

## 2022-10-25 DIAGNOSIS — E1169 Type 2 diabetes mellitus with other specified complication: Secondary | ICD-10-CM | POA: Diagnosis not present

## 2022-10-25 DIAGNOSIS — R42 Dizziness and giddiness: Secondary | ICD-10-CM

## 2022-10-25 DIAGNOSIS — G959 Disease of spinal cord, unspecified: Secondary | ICD-10-CM

## 2022-10-25 LAB — MICROALBUMIN, URINE WAIVED
Creatinine, Urine Waived: 10 mg/dL (ref 10–300)
Microalb, Ur Waived: 10 mg/L (ref 0–19)

## 2022-10-25 LAB — BAYER DCA HB A1C WAIVED: HB A1C (BAYER DCA - WAIVED): 3.2 % — ABNORMAL LOW (ref 4.8–5.6)

## 2022-10-25 MED ORDER — GABAPENTIN 600 MG PO TABS: 600.0000 mg | ORAL_TABLET | Freq: Two times a day (BID) | ORAL | 4 refills | Status: DC

## 2022-10-25 MED ORDER — ATORVASTATIN CALCIUM 10 MG PO TABS: 10.0000 mg | ORAL_TABLET | Freq: Every day | ORAL | 4 refills | Status: DC

## 2022-10-25 MED ORDER — FUROSEMIDE 80 MG PO TABS: 80.0000 mg | ORAL_TABLET | Freq: Every day | ORAL | 4 refills | Status: DC

## 2022-10-25 MED ORDER — POTASSIUM CHLORIDE ER 10 MEQ PO TBCR: 10.0000 meq | EXTENDED_RELEASE_TABLET | Freq: Every day | ORAL | 4 refills | Status: DC

## 2022-10-25 NOTE — Assessment & Plan Note (Signed)
Chronic, stable.  BP well below goal today.  Continue current medications and adjust as needed.  Recommend he monitor BP at least a few mornings a week at home and document for provider visits.  DASH diet at home. Labs: CMP and urine ALB.  Urine ALB 06 Oct 2022.  Continue collaboration with [facility] cardiology.  Return in 6 months.

## 2022-10-25 NOTE — Assessment & Plan Note (Signed)
Chronic, stable with A1c well below goal with weight loss today at 3.2% -- PRAISED FOR THIS, and urine ALB 06 Oct 2022.  Recommend he completely STOP Metformin at this time and continue Jardiance only, if elevations in sugars at home he is to alert provider.  Continue Gabapentin for neuropathy pain and dose renally as needed.  At this time recommend he monitor BS daily and document for visits + focus on diabetic diet. Return in 6 months. - Recommend he schedule eye exam - Foot exam up to date - Vaccinations, except Shingrix, up to date. - Not on ACE due to side effects with this.  Is on Statin.

## 2022-10-25 NOTE — Assessment & Plan Note (Signed)
Chronic, ongoing.  At this time continue collaboration with psychiatry and current medication regimen as prescribed by them. 

## 2022-10-25 NOTE — Assessment & Plan Note (Signed)
Stable.  Continue collaboration with neurosurgery as needed, fusion in June 2022.

## 2022-10-25 NOTE — Assessment & Plan Note (Signed)
Refer to cervical myelopathy plan of care.  Overall is stable at this time. 

## 2022-10-25 NOTE — Progress Notes (Signed)
BP 105/69   Pulse 72   Temp 97.9 F (36.6 C) (Oral)   Ht 5\' 10"  (1.778 m)   Wt 181 lb 12.8 oz (82.5 kg)   BMI 26.09 kg/m    Subjective:    Patient ID: Derek Garza, male    DOB: 1960-09-25, 62 y.o.   MRN: [id number]  HPI: Derek Garza is a 62 y.o. male  Chief Complaint  Patient presents with   Diabetes   Hypertension   Hyperlipidemia   Mood   DIABETES A1c 4.9% in November.  Continues as Metformin 500 MG daily and Jardiance 25 MG daily -- he tried to go without Metformin and reports sugars went "out of whack".    Takes Gabapentin 600 MG BID for neuropathy discomfort.  Has lost 29 pounds since March 2023, has been working on weight loss for overall health. Hypoglycemic episodes:no Polydipsia/polyuria: no Visual disturbance: no Chest pain: no Paresthesias: no Glucose Monitoring: yes             Accucheck frequency: daily             Fasting glucose: 99 to 110             Post prandial:             Evening:             Before meals: Taking Insulin?: no             Long acting insulin:             Short acting insulin: Blood Pressure Monitoring: not checking Retinal Examination: Not Up to Date -- needs to schedule, [facility] Exam: Up to Date Pneumovax: Up to Date Influenza: Up to Date Aspirin: no    HYPERTENSION / HYPERLIPIDEMIA Taking Lasix 80 MG daily, K+, and Atorvastatin 10 MG daily.  Saw cardiology at [facility] on 02/09/22 last and they stopped Benazepril on 12/29/21 -- this appeared to help some of his chronic dizziness.  Last visit he has started back on it, but reports coming back off not long after which has overall helped dizziness.  Follows with neurology as needed for dizziness, last seen 05/04/22.  Fused C 3, 4, 5 on June 20th, 2022 -- for cervical myelopathy.   Satisfied with current treatment? yes Duration of hypertension: chronic BP monitoring frequency: occasional BP range: BP medication side effects: no Duration of hyperlipidemia:  chronic Cholesterol medication side effects: no Cholesterol supplements: none Medication compliance: good compliance Aspirin: no Recent stressors: no Recurrent headaches: no Visual changes: no Palpitations: no Dyspnea: no Chest pain: no Lower extremity edema: no Dizzy/lightheaded: occasional -- followed by neurology as needed   DEPRESSION Currently taking Seroquel and Tegretol for mood.  Follows with Dr. Sharrie Rothman in [place]. Mood status: stable Satisfied with current treatment?: yes Symptom severity: moderate  Duration of current treatment : chronic Side effects: no Medication compliance: good compliance Previous psychiatric medications: multiple Depressed mood: occasional Anxious mood: no Anhedonia: no Significant weight loss or gain: no Insomnia: none Fatigue: no Feelings of worthlessness or guilt: no Impaired concentration/indecisiveness: no Suicidal ideations: no Hopelessness: no Crying spells: no    10/25/2022    8:41 AM 08/14/2022    4:12 PM 04/25/2022    8:52 AM 08/24/2021   10:36 AM 03/07/2021    8:33 AM  Depression screen PHQ 2/9  Decreased Interest 1 1 0 0 0  Down, Depressed, Hopeless 0 0 0 0 0  PHQ - 2 Score 1  1 0 0 0  Altered sleeping 1 0 0 1 0  Tired, decreased energy 0 0 0 0 0  Change in appetite 0 0 0 0 0  Feeling bad or failure about yourself  0 0 0 0 0  Trouble concentrating 0 0 0 0 0  Moving slowly or fidgety/restless 0 0 0 0 0  Suicidal thoughts 0 0 0 0 0  PHQ-9 Score 2 1 0 1 0  Difficult doing work/chores   Not difficult at all Not difficult at all Not difficult at all       10/25/2022    8:42 AM 08/14/2022    4:13 PM 04/25/2022    8:52 AM 08/24/2021   10:38 AM  GAD 7 : Generalized Anxiety Score  Nervous, Anxious, on Edge 0 0 0 0  Control/stop worrying 0 0 0 0  Worry too much - different things 0 0 0 0  Trouble relaxing 0 0 0 0  Restless 0 0 0 0  Easily annoyed or irritable 0 0 0 0  Afraid - awful might happen 0 0 0 0  Total GAD 7  Score 0 0 0 0  Anxiety Difficulty Not difficult at all  Not difficult at all Not difficult at all   Relevant past medical, surgical, family and social history reviewed and updated as indicated. Interim medical history since our last visit reviewed. Allergies and medications reviewed and updated.  Review of Systems  Constitutional:  Negative for activity change, diaphoresis, fatigue and fever.  Respiratory:  Negative for cough, chest tightness, shortness of breath and wheezing.   Cardiovascular:  Negative for chest pain, palpitations and leg swelling.  Gastrointestinal: Negative.   Endocrine: Negative for cold intolerance, heat intolerance, polydipsia, polyphagia and polyuria.  Neurological:  Positive for dizziness (occasional, but manageable). Negative for syncope, weakness, light-headedness, numbness and headaches.  Psychiatric/Behavioral: Negative.     Per HPI unless specifically indicated above     Objective:    BP 105/69   Pulse 72   Temp 97.9 F (36.6 C) (Oral)   Ht 5\' 10"  (1.778 m)   Wt 181 lb 12.8 oz (82.5 kg)   BMI 26.09 kg/m   Wt Readings from Last 3 Encounters:  10/25/22 181 lb 12.8 oz (82.5 kg)  08/14/22 183 lb 9.6 oz (83.3 kg)  05/04/22 190 lb (86.2 kg)    Physical Exam Vitals and nursing note reviewed.  Constitutional:      General: He is awake. He is not in acute distress.    Appearance: He is well-developed and well-groomed. He is not ill-appearing or toxic-appearing.  HENT:     Head: Normocephalic and atraumatic.     Right Ear: Hearing and external ear normal. No drainage.     Left Ear: Hearing and external ear normal. No drainage.  Eyes:     General: Lids are normal.        Right eye: No discharge.        Left eye: No discharge.     Conjunctiva/sclera: Conjunctivae normal.     Pupils: Pupils are equal, round, and reactive to light.  Neck:     Thyroid: No thyromegaly.     Vascular: No carotid bruit.     Trachea: Trachea normal.  Cardiovascular:      Rate and Rhythm: Normal rate and regular rhythm.     Heart sounds: Normal heart sounds, S1 normal and S2 normal. No murmur heard.    No gallop.  Pulmonary:  Effort: Pulmonary effort is normal. No accessory muscle usage or respiratory distress.     Breath sounds: Normal breath sounds.  Abdominal:     General: Bowel sounds are normal.     Palpations: Abdomen is soft.  Musculoskeletal:        General: Normal range of motion.     Cervical back: Normal range of motion and neck supple.     Right lower leg: No edema.     Left lower leg: No edema.  Lymphadenopathy:     Cervical: No cervical adenopathy.  Skin:    General: Skin is warm and dry.     Capillary Refill: Capillary refill takes less than 2 seconds.     Findings: No rash.  Neurological:     Mental Status: He is alert and oriented to person, place, and time.     Coordination: Coordination is intact.     Gait: Gait is intact.     Deep Tendon Reflexes: Reflexes are normal and symmetric.     Reflex Scores:      Brachioradialis reflexes are 2+ on the right side and 2+ on the left side.      Patellar reflexes are 2+ on the right side and 2+ on the left side. Psychiatric:        Attention and Perception: Attention normal.        Mood and Affect: Mood normal.        Speech: Speech normal.        Behavior: Behavior normal. Behavior is cooperative.        Thought Content: Thought content normal.    Results for orders placed or performed in visit on 04/25/22  Bayer DCA Hb A1c Waived  Result Value Ref Range   HB A1C (BAYER DCA - WAIVED) 4.9 4.8 - 5.6 %  CBC with Differential/Platelet  Result Value Ref Range   WBC 7.2 3.4 - 10.8 x10E3/uL   RBC 4.16 4.14 - 5.80 x10E6/uL   Hemoglobin 13.4 13.0 - 17.7 g/dL   Hematocrit 16.1 09.6 - 51.0 %   MCV 100 (H) 79 - 97 fL   MCH 32.2 26.6 - 33.0 pg   MCHC 32.4 31.5 - 35.7 g/dL   RDW 04.5 40.9 - 81.1 %   Platelets 360 150 - 450 x10E3/uL   Neutrophils 57 Not Estab. %   Lymphs 26 Not Estab.  %   Monocytes 14 Not Estab. %   Eos 2 Not Estab. %   Basos 1 Not Estab. %   Neutrophils Absolute 4.1 1.4 - 7.0 x10E3/uL   Lymphocytes Absolute 1.9 0.7 - 3.1 x10E3/uL   Monocytes Absolute 1.0 (H) 0.1 - 0.9 x10E3/uL   EOS (ABSOLUTE) 0.1 0.0 - 0.4 x10E3/uL   Basophils Absolute 0.1 0.0 - 0.2 x10E3/uL   Immature Granulocytes 0 Not Estab. %   Immature Grans (Abs) 0.0 0.0 - 0.1 x10E3/uL  Comprehensive metabolic panel  Result Value Ref Range   Glucose 113 (H) 70 - 99 mg/dL   BUN 20 8 - 27 mg/dL   Creatinine, Ser 9.14 0.76 - 1.27 mg/dL   eGFR 73 >78 GN/FAO/1.30   BUN/Creatinine Ratio 18 10 - 24   Sodium 133 (L) 134 - 144 mmol/L   Potassium 4.7 3.5 - 5.2 mmol/L   Chloride 94 (L) 96 - 106 mmol/L   CO2 22 20 - 29 mmol/L   Calcium 9.8 8.6 - 10.2 mg/dL   Total Protein 7.1 6.0 - 8.5 g/dL   Albumin 4.6 3.9 -  4.9 g/dL   Globulin, Total 2.5 1.5 - 4.5 g/dL   Albumin/Globulin Ratio 1.8 1.2 - 2.2   Bilirubin Total 0.4 0.0 - 1.2 mg/dL   Alkaline Phosphatase 115 44 - 121 IU/L   AST 30 0 - 40 IU/L   ALT 28 0 - 44 IU/L  Lipid Panel w/o Chol/HDL Ratio  Result Value Ref Range   Cholesterol, Total 162 100 - 199 mg/dL   Triglycerides 90 0 - 149 mg/dL   HDL 54 >16 mg/dL   VLDL Cholesterol Cal 17 5 - 40 mg/dL   LDL Chol Calc (NIH) 91 0 - 99 mg/dL  TSH  Result Value Ref Range   TSH 2.540 0.450 - 4.500 uIU/mL  VITAMIN D 25 Hydroxy (Vit-D Deficiency, Fractures)  Result Value Ref Range   Vit D, 25-Hydroxy 83.5 30.0 - 100.0 ng/mL  PSA  Result Value Ref Range   Prostate Specific Ag, Serum 1.7 0.0 - 4.0 ng/mL      Assessment & Plan:   Problem List Items Addressed This Visit       Cardiovascular and Mediastinum   Hypertension associated with diabetes (HCC)    Chronic, stable.  BP well below goal today.  Continue current medications and adjust as needed.  Recommend he monitor BP at least a few mornings a week at home and document for provider visits.  DASH diet at home. Labs: CMP and urine ALB.   Urine ALB 06 Oct 2022.  Continue collaboration with [facility] cardiology.  Return in 6 months.      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  Recommend he completely STOP Metformin at this time and continue Jardiance only, if elevations in sugars at home he is to alert provider.  Continue Gabapentin for neuropathy pain and dose renally as needed.  At this time recommend he monitor BS daily and document for visits + focus on diabetic diet. Return in 6 months. - Recommend he schedule eye exam - Foot exam up to date - Vaccinations, except Shingrix, up to date. - Not on ACE due to side effects with this.  Is on Statin.      Relevant Medications   atorvastatin (LIPITOR) 10 MG tablet   gabapentin (NEURONTIN) 600 MG tablet   Other Relevant Orders   Bayer DCA Hb A1c Waived   Microalbumin, Urine Waived   Comprehensive metabolic panel   Hyperlipidemia associated with type 2 diabetes mellitus (HCC)    Chronic, ongoing.  Continue current medication regimen and adjust as needed.  Lipid panel today.      Relevant Medications   atorvastatin (LIPITOR) 10 MG tablet   furosemide (LASIX) 80 MG tablet   Other Relevant Orders   Bayer DCA Hb A1c Waived   Comprehensive metabolic panel   Lipid Panel w/o Chol/HDL Ratio     Nervous and Auditory   Cervical myelopathy (HCC)    Stable.  Continue collaboration with neurosurgery as needed, fusion in June 2022.         Other   Bipolar 2 disorder (HCC)    Chronic,  ongoing.  At this time continue collaboration with psychiatry and current medication regimen as prescribed by them.      Dizziness     Refer to cervical myelopathy plan of care.  Overall is stable at this time.      Relevant Orders   Comprehensive metabolic panel   Other Visit Diagnoses     Colon cancer screening       Referral to GI placed.   Relevant Orders   Ambulatory referral to Gastroenterology        Follow up plan: Return in about 6 months (around 04/27/2023) for Annual physical after 04/26/23.

## 2022-10-25 NOTE — Assessment & Plan Note (Signed)
Chronic, ongoing.  Continue current medication regimen and adjust as needed. Lipid panel today. 

## 2022-10-26 LAB — LIPID PANEL W/O CHOL/HDL RATIO
Cholesterol, Total: 158 mg/dL (ref 100–199)
HDL: 63 mg/dL (ref >39)
LDL Chol Calc (NIH): 80 mg/dL (ref 0–99)
Triglycerides: 77 mg/dL (ref 0–149)
VLDL Cholesterol Cal: 15 mg/dL (ref 5–40)

## 2022-10-26 LAB — COMPREHENSIVE METABOLIC PANEL
ALT: 15 IU/L (ref 0–44)
AST: 32 IU/L (ref 0–40)
Albumin/Globulin Ratio: 1.8 (ref 1.2–2.2)
Albumin: 4.6 g/dL (ref 3.9–4.9)
Alkaline Phosphatase: 148 IU/L — ABNORMAL HIGH (ref 44–121)
BUN/Creatinine Ratio: 13 (ref 10–24)
BUN: 12 mg/dL (ref 8–27)
Bilirubin Total: 0.8 mg/dL (ref 0.0–1.2)
CO2: 20 mmol/L (ref 20–29)
Calcium: 9.2 mg/dL (ref 8.6–10.2)
Chloride: 94 mmol/L — ABNORMAL LOW (ref 96–106)
Creatinine, Ser: 0.9 mg/dL (ref 0.76–1.27)
Globulin, Total: 2.6 g/dL (ref 1.5–4.5)
Glucose: 115 mg/dL — ABNORMAL HIGH (ref 70–99)
Potassium: 4.3 mmol/L (ref 3.5–5.2)
Sodium: 136 mmol/L (ref 134–144)
Total Protein: 7.2 g/dL (ref 6.0–8.5)
eGFR: 96 mL/min/{1.73_m2} (ref >59)

## 2022-10-26 NOTE — Progress Notes (Signed)
Contacted via MyChart   Good afternoon Derek Garza, your labs have returned and overall remain stable. - Kidney function, creatinine and eGFR, remains normal, as is liver function, AST and ALT.  - Cholesterol levels stable, we may further increase Atorvastatin to 20 MG next visit to get LDL <70.  Any questions? Keep being amazing!!  Thank you for allowing me to participate in your care.  I appreciate you. Kindest regards, Burgess Sheriff

## 2022-11-09 ENCOUNTER — Other Ambulatory Visit: Payer: Self-pay | Admitting: Nurse Practitioner

## 2022-11-09 NOTE — Telephone Encounter (Signed)
Requested Prescriptions  Pending Prescriptions Disp Refills   KLOR-CON M10 10 MEQ tablet [Pharmacy Med Name: KLOR-CON M10 TABLET] 90 tablet 3    Sig: TAKE ONE TABLET BY MOUTH DAILY     Endocrinology:  Minerals - Potassium Supplementation Passed - 11/09/2022  2:34 AM      Passed - K in normal range and within 360 days    Potassium  Date Value Ref Range Status  10/25/2022 4.3 3.5 - 5.2 mmol/L Final  06/09/2012 3.7 3.5 - 5.1 mmol/L Final         Passed - Cr in normal range and within 360 days    Creatinine  Date Value Ref Range Status  06/09/2012 1.02 0.60 - 1.30 mg/dL Final   Creatinine, Ser  Date Value Ref Range Status  10/25/2022 0.90 0.76 - 1.27 mg/dL Final         Passed - Valid encounter within last 12 months    Recent Outpatient Visits           2 weeks ago DM type 2 with diabetic peripheral neuropathy (HCC)   [place] [facility] [place], Corrie Dandy T, NP   2 months ago Hypertension associated with diabetes [facility])   [place] [facility] Larae Grooms, NP   6 months ago DM type 2 with diabetic peripheral neuropathy (HCC)   [place] [facility] [place], Corrie Dandy T, NP   1 year ago DM type 2 with diabetic peripheral neuropathy (HCC)   [place] [facility] [place], Corrie Dandy T, NP   1 year ago DM type 2 with diabetic peripheral neuropathy (HCC)   [place] [facility] [place], Dorie Rank, NP       Future Appointments             In 6 months Cannady, Dorie Rank, NP [place] [facility], [facility]

## 2022-11-10 ENCOUNTER — Other Ambulatory Visit: Payer: Self-pay | Admitting: Nurse Practitioner

## 2022-11-10 NOTE — Telephone Encounter (Signed)
Requested medication (s) are due for refill today: routing for review  Requested medication (s) are on the active medication list: no  Last refill:  08/15/22  Future visit scheduled: yes  Notes to clinic:  Unable to refill per protocol, Rx expired. Medication is not on current medication list, routing for approval.      Requested Prescriptions  Pending Prescriptions Disp Refills   benazepril (LOTENSIN) 40 MG tablet [Pharmacy Med Name: BENAZEPRIL HCL 40 MG TABLET] 90 tablet 4    Sig: TAKE ONE TABLET BY MOUTH DAILY     Cardiovascular:  ACE Inhibitors Passed - 11/10/2022  2:31 AM      Passed - Cr in normal range and within 180 days    Creatinine  Date Value Ref Range Status  06/09/2012 1.02 0.60 - 1.30 mg/dL Final   Creatinine, Ser  Date Value Ref Range Status  10/25/2022 0.90 0.76 - 1.27 mg/dL Final         Passed - K in normal range and within 180 days    Potassium  Date Value Ref Range Status  10/25/2022 4.3 3.5 - 5.2 mmol/L Final  06/09/2012 3.7 3.5 - 5.1 mmol/L Final         Passed - Patient is not pregnant      Passed - Last BP in normal range    BP Readings from Last 1 Encounters:  10/25/22 105/69         Passed - Valid encounter within last 6 months    Recent Outpatient Visits           2 weeks ago DM type 2 with diabetic peripheral neuropathy (HCC)   [place] [facility] [place], Corrie Dandy T, NP   2 months ago Hypertension associated with diabetes [facility])   [place] [facility] Larae Grooms, NP   6 months ago DM type 2 with diabetic peripheral neuropathy (HCC)   [place] [facility] [place], Corrie Dandy T, NP   1 year ago DM type 2 with diabetic peripheral neuropathy (HCC)   [facility] [place], Corrie Dandy T, NP   1 year ago DM type 2 with diabetic peripheral neuropathy (HCC)   [place] [facility] [place], Dorie Rank, NP       Future Appointments             In 5 months  Cannady, Dorie Rank, NP [place] [facility], [facility]

## 2022-12-30 ENCOUNTER — Other Ambulatory Visit: Payer: Self-pay | Admitting: Nurse Practitioner

## 2023-01-01 NOTE — Telephone Encounter (Signed)
Discontinued Discontinued by provider Marjie Skiff, NP 10/25/22 0848   Requested Prescriptions  Refused Prescriptions Disp Refills   metFORMIN (GLUCOPHAGE) 500 MG tablet [Pharmacy Med Name: METFORMIN HCL 500 MG TABLET] 180 tablet 4    Sig: TAKE 1 TABLET BY MOUTH 2 TIMES DAILY WITH A MEAL.     Endocrinology:  Diabetes - Biguanides Failed - 12/30/2022  7:53 AM      Failed - B12 Level in normal range and within 720 days    Vitamin B-12  Date Value Ref Range Status  12/03/2020 >2000 (H) 232 - 1245 pg/mL Final         Passed - Cr in normal range and within 360 days    Creatinine  Date Value Ref Range Status  06/09/2012 1.02 0.60 - 1.30 mg/dL Final   Creatinine, Ser  Date Value Ref Range Status  10/25/2022 0.90 0.76 - 1.27 mg/dL Final         Passed - HBA1C is between 0 and 7.9 and within 180 days    Hemoglobin A1C  Date Value Ref Range Status  01/10/2016 7.3  Final   HB A1C (BAYER DCA - WAIVED)  Date Value Ref Range Status  10/25/2022 3.2 (L) 4.8 - 5.6 % Final    Comment:             Prediabetes: 5.7 - 6.4          Diabetes: >6.4          Glycemic control for adults with diabetes: <7.0          Passed - eGFR in normal range and within 360 days    EGFR (African American)  Date Value Ref Range Status  06/09/2012 >60  Final   GFR calc Af Amer  Date Value Ref Range Status  06/29/2020 90 >59 mL/min/1.73 Final    Comment:    **In accordance with recommendations from the NKF-ASN Task force,**   [facility] is in the process of updating its eGFR calculation to the   2021 CKD-EPI creatinine equation that estimates kidney function   without a race variable.    EGFR (Non-African Amer.)  Date Value Ref Range Status  06/09/2012 >60  Final    Comment:    eGFR values <55mL/min/1.73 m2 may be an indication of chronic kidney disease (CKD). Calculated eGFR is useful in patients with stable renal function. The eGFR calculation will not be reliable in acutely ill patients when  serum creatinine is changing rapidly. It is not useful in  patients on dialysis. The eGFR calculation may not be applicable to patients at the low and high extremes of body sizes, pregnant women, and vegetarians.    GFR calc non Af Amer  Date Value Ref Range Status  06/29/2020 78 >59 mL/min/1.73 Final   eGFR  Date Value Ref Range Status  10/25/2022 96 >59 mL/min/1.73 Final         Passed - Valid encounter within last 6 months    Recent Outpatient Visits           2 months ago DM type 2 with diabetic peripheral neuropathy (HCC)   [place] [facility] [place], [place] T, NP   4 months ago Hypertension associated with diabetes [facility])   [place] [facility] Larae Grooms, NP   8 months ago DM type 2 with diabetic peripheral neuropathy [facility])   [place] [facility] [place], Corrie Dandy T, NP   1 year ago DM type 2  with diabetic peripheral neuropathy (HCC)   [place] [facility] [place], Corrie Dandy T, NP   1 year ago DM type 2 with diabetic peripheral neuropathy (HCC)   [place] [facility] [place], Corrie Dandy T, NP       Future Appointments             In 4 months Cannady, [place] T, NP [place] [facility]            Passed - CBC within normal limits and completed in the last 12 months    WBC  Date Value Ref Range Status  04/25/2022 7.2 3.4 - 10.8 x10E3/uL Final  06/09/2012 6.7 3.8 - 10.6 x10 3/mm 3 Final   RBC  Date Value Ref Range Status  04/25/2022 4.16 4.14 - 5.80 x10E6/uL Final  06/09/2012 4.38 (L) 4.40 - 5.90 x10 6/mm 3 Final   Hemoglobin  Date Value Ref Range Status  04/25/2022 13.4 13.0 - 17.7 g/dL Final   Hematocrit  Date Value Ref Range Status  04/25/2022 41.4 37.5 - 51.0 % Final   MCHC  Date Value Ref Range Status  04/25/2022 32.4 31.5 - 35.7 g/dL Final  46/96/2952 84.1 32.0 - 36.0 g/dL Final   [facility]  Date Value Ref Range Status  04/25/2022 32.2 26.6 - 33.0  pg Final  06/09/2012 30.4 26.0 - 34.0 pg Final   MCV  Date Value Ref Range Status  04/25/2022 100 (H) 79 - 97 fL Final  06/09/2012 93 80 - 100 fL Final   No results found for: "PLTCOUNTKUC", "LABPLAT", "POCPLA" RDW  Date Value Ref Range Status  04/25/2022 12.2 11.6 - 15.4 % Final  06/09/2012 13.1 11.5 - 14.5 % Final

## 2023-01-03 ENCOUNTER — Other Ambulatory Visit: Payer: Self-pay | Admitting: Nurse Practitioner

## 2023-01-04 ENCOUNTER — Other Ambulatory Visit: Payer: Self-pay | Admitting: Nurse Practitioner

## 2023-01-04 NOTE — Telephone Encounter (Signed)
Requested Prescriptions  Refused Prescriptions Disp Refills   metFORMIN (GLUCOPHAGE) 500 MG tablet [Pharmacy Med Name: METFORMIN HCL 500 MG TABLET] 180 tablet 4    Sig: TAKE 1 TABLET BY MOUTH 2 TIMES DAILY WITH A MEAL.     Endocrinology:  Diabetes - Biguanides Failed - 01/03/2023 11:02 AM      Failed - B12 Level in normal range and within 720 days    Vitamin B-12  Date Value Ref Range Status  12/03/2020 >2000 (H) 232 - 1245 pg/mL Final         Passed - Cr in normal range and within 360 days    Creatinine  Date Value Ref Range Status  06/09/2012 1.02 0.60 - 1.30 mg/dL Final   Creatinine, Ser  Date Value Ref Range Status  10/25/2022 0.90 0.76 - 1.27 mg/dL Final         Passed - HBA1C is between 0 and 7.9 and within 180 days    Hemoglobin A1C  Date Value Ref Range Status  01/10/2016 7.3  Final   HB A1C (BAYER DCA - WAIVED)  Date Value Ref Range Status  10/25/2022 3.2 (L) 4.8 - 5.6 % Final    Comment:             Prediabetes: 5.7 - 6.4          Diabetes: >6.4          Glycemic control for adults with diabetes: <7.0          Passed - eGFR in normal range and within 360 days    EGFR (African American)  Date Value Ref Range Status  06/09/2012 >60  Final   GFR calc Af Amer  Date Value Ref Range Status  06/29/2020 90 >59 mL/min/1.73 Final    Comment:    **In accordance with recommendations from the NKF-ASN Task force,**   [facility] is in the process of updating its eGFR calculation to the   2021 CKD-EPI creatinine equation that estimates kidney function   without a race variable.    EGFR (Non-African Amer.)  Date Value Ref Range Status  06/09/2012 >60  Final    Comment:    eGFR values <87mL/min/1.73 m2 may be an indication of chronic kidney disease (CKD). Calculated eGFR is useful in patients with stable renal function. The eGFR calculation will not be reliable in acutely ill patients when serum creatinine is changing rapidly. It is not useful in  patients on  dialysis. The eGFR calculation may not be applicable to patients at the low and high extremes of body sizes, pregnant women, and vegetarians.    GFR calc non Af Amer  Date Value Ref Range Status  06/29/2020 78 >59 mL/min/1.73 Final   eGFR  Date Value Ref Range Status  10/25/2022 96 >59 mL/min/1.73 Final         Passed - Valid encounter within last 6 months    Recent Outpatient Visits           2 months ago DM type 2 with diabetic peripheral neuropathy (HCC)   [place] [facility] [place], [place] T, NP   4 months ago Hypertension associated with diabetes [facility])   [place] [facility] Larae Grooms, NP   8 months ago DM type 2 with diabetic peripheral neuropathy (HCC)   [place] [facility] [place], Corrie Dandy T, NP   1 year ago DM type 2 with diabetic peripheral neuropathy [facility])   [place] [facility] [place],  Dorie Rank, NP   1 year ago DM type 2 with diabetic peripheral neuropathy (HCC)   [place] [facility] [place], Corrie Dandy T, NP       Future Appointments             In 4 months Cannady, [place] T, NP [place] [facility]            Passed - CBC within normal limits and completed in the last 12 months    WBC  Date Value Ref Range Status  04/25/2022 7.2 3.4 - 10.8 x10E3/uL Final  06/09/2012 6.7 3.8 - 10.6 x10 3/mm 3 Final   RBC  Date Value Ref Range Status  04/25/2022 4.16 4.14 - 5.80 x10E6/uL Final  06/09/2012 4.38 (L) 4.40 - 5.90 x10 6/mm 3 Final   Hemoglobin  Date Value Ref Range Status  04/25/2022 13.4 13.0 - 17.7 g/dL Final   Hematocrit  Date Value Ref Range Status  04/25/2022 41.4 37.5 - 51.0 % Final   MCHC  Date Value Ref Range Status  04/25/2022 32.4 31.5 - 35.7 g/dL Final  33/29/5188 41.6 32.0 - 36.0 g/dL Final   [facility]  Date Value Ref Range Status  04/25/2022 32.2 26.6 - 33.0 pg Final  06/09/2012 30.4 26.0 - 34.0 pg Final   MCV  Date Value Ref  Range Status  04/25/2022 100 (H) 79 - 97 fL Final  06/09/2012 93 80 - 100 fL Final   No results found for: "PLTCOUNTKUC", "LABPLAT", "POCPLA" RDW  Date Value Ref Range Status  04/25/2022 12.2 11.6 - 15.4 % Final  06/09/2012 13.1 11.5 - 14.5 % Final

## 2023-01-05 ENCOUNTER — Telehealth: Payer: Self-pay | Admitting: Nurse Practitioner

## 2023-01-05 ENCOUNTER — Encounter: Payer: Self-pay | Admitting: Nurse Practitioner

## 2023-01-05 NOTE — Telephone Encounter (Unsigned)
Copied from CRM [id number]. Topic: General - Other >> Jan 05, 2023  1:45 PM Dondra Prader E wrote: Reason for CRM: Pt called and has questions about his medication Metformin, wants to speak to the clinic. Please advise  Best contact: [phone]  Says his sugar goes up and down without taking it, waiting for call from clinic

## 2023-01-05 NOTE — Telephone Encounter (Signed)
Requested medications are due for refill today.  Unsure  Requested medications are on the active medications list.  no  Last refill. unsure  Future visit scheduled.   yes  Notes to clinic.  Medication d/c'd. Please review for refill.    Requested Prescriptions  Pending Prescriptions Disp Refills   metFORMIN (GLUCOPHAGE) 500 MG tablet [Pharmacy Med Name: METFORMIN HCL 500 MG TABLET] 180 tablet 4    Sig: TAKE 1 TABLET BY MOUTH 2 TIMES DAILY WITH A MEAL.     Endocrinology:  Diabetes - Biguanides Failed - 01/04/2023  7:46 PM      Failed - B12 Level in normal range and within 720 days    Vitamin B-12  Date Value Ref Range Status  12/03/2020 >2000 (H) 232 - 1245 pg/mL Final         Passed - Cr in normal range and within 360 days    Creatinine  Date Value Ref Range Status  06/09/2012 1.02 0.60 - 1.30 mg/dL Final   Creatinine, Ser  Date Value Ref Range Status  10/25/2022 0.90 0.76 - 1.27 mg/dL Final         Passed - HBA1C is between 0 and 7.9 and within 180 days    Hemoglobin A1C  Date Value Ref Range Status  01/10/2016 7.3  Final   HB A1C (BAYER DCA - WAIVED)  Date Value Ref Range Status  10/25/2022 3.2 (L) 4.8 - 5.6 % Final    Comment:             Prediabetes: 5.7 - 6.4          Diabetes: >6.4          Glycemic control for adults with diabetes: <7.0          Passed - eGFR in normal range and within 360 days    EGFR (African American)  Date Value Ref Range Status  06/09/2012 >60  Final   GFR calc Af Amer  Date Value Ref Range Status  06/29/2020 90 >59 mL/min/1.73 Final    Comment:    **In accordance with recommendations from the NKF-ASN Task force,**   [facility] is in the process of updating its eGFR calculation to the   2021 CKD-EPI creatinine equation that estimates kidney function   without a race variable.    EGFR (Non-African Amer.)  Date Value Ref Range Status  06/09/2012 >60  Final    Comment:    eGFR values <64mL/min/1.73 m2 may be an indication of  chronic kidney disease (CKD). Calculated eGFR is useful in patients with stable renal function. The eGFR calculation will not be reliable in acutely ill patients when serum creatinine is changing rapidly. It is not useful in  patients on dialysis. The eGFR calculation may not be applicable to patients at the low and high extremes of body sizes, pregnant women, and vegetarians.    GFR calc non Af Amer  Date Value Ref Range Status  06/29/2020 78 >59 mL/min/1.73 Final   eGFR  Date Value Ref Range Status  10/25/2022 96 >59 mL/min/1.73 Final         Passed - Valid encounter within last 6 months    Recent Outpatient Visits           2 months ago DM type 2 with diabetic peripheral neuropathy (HCC)   [place] [facility] [place], Corrie Dandy T, NP   4 months ago Hypertension associated with diabetes [facility])   [place] [facility] [place],  NP   8 months ago DM type 2 with diabetic peripheral neuropathy (HCC)   [place] [facility] [place], Corrie Dandy T, NP   1 year ago DM type 2 with diabetic peripheral neuropathy (HCC)   [place] [facility] [place], Corrie Dandy T, NP   1 year ago DM type 2 with diabetic peripheral neuropathy (HCC)   [place] [facility] [place], Corrie Dandy T, NP       Future Appointments             In 4 months Cannady, [place] T, NP [place] [facility]            Passed - CBC within normal limits and completed in the last 12 months    WBC  Date Value Ref Range Status  04/25/2022 7.2 3.4 - 10.8 x10E3/uL Final  06/09/2012 6.7 3.8 - 10.6 x10 3/mm 3 Final   RBC  Date Value Ref Range Status  04/25/2022 4.16 4.14 - 5.80 x10E6/uL Final  06/09/2012 4.38 (L) 4.40 - 5.90 x10 6/mm 3 Final   Hemoglobin  Date Value Ref Range Status  04/25/2022 13.4 13.0 - 17.7 g/dL Final   Hematocrit  Date Value Ref Range Status  04/25/2022 41.4 37.5 - 51.0 % Final   MCHC  Date  Value Ref Range Status  04/25/2022 32.4 31.5 - 35.7 g/dL Final  64/40/3474 25.9 32.0 - 36.0 g/dL Final   [facility]  Date Value Ref Range Status  04/25/2022 32.2 26.6 - 33.0 pg Final  06/09/2012 30.4 26.0 - 34.0 pg Final   MCV  Date Value Ref Range Status  04/25/2022 100 (H) 79 - 97 fL Final  06/09/2012 93 80 - 100 fL Final   No results found for: "PLTCOUNTKUC", "LABPLAT", "POCPLA" RDW  Date Value Ref Range Status  04/25/2022 12.2 11.6 - 15.4 % Final  06/09/2012 13.1 11.5 - 14.5 % Final

## 2023-01-08 NOTE — Telephone Encounter (Signed)
Patient notified of Derek Garza's response and suggestion.

## 2023-01-08 NOTE — Telephone Encounter (Signed)
Called and spoke to patient. Advised him of Jolene's message regarding Metformin (see mychart message that was sent to the patient).  Patient also states he would like Jolene's thoughts on how to overcome sleep deprivation. States he has an 60 month old puppy and has lost a lot of sleep since having him. Patient would like Jolene's thoughts and suggestions on how he can overcome this.

## 2023-02-01 ENCOUNTER — Ambulatory Visit: Payer: Self-pay

## 2023-02-01 NOTE — Telephone Encounter (Signed)
Chief Complaint: Abdominal pain  Symptoms: Right lower Abdominal pain  Frequency: comes and goes  Pertinent Negatives: Patient denies nausea, vomiting, urinary problems Disposition: [] ED /[] Urgent Care (no appt availability in office) / [x] Appointment(In office/virtual)/ []  [facility]/ [] Home Care/ [] Refused Recommended Disposition /[] [facility]/ []  Follow-up with PCP Additional Notes: Patient reports abdominal pain onset yesterday. Patient states he has right lower abdominal pain that feels similar to appendicitis. 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 Patient states he has tried over the counter medications and it has not helped. Care advice was given and patient has been scheduled to see PCP tomorrow at 1540. Advised patient if pain continues to get worse go to Urgent care or ED. Patient verbalized understanding.  Reason for Disposition  [1] MODERATE pain (e.g., interferes with normal activities) AND [2] pain comes and goes (cramps) AND [3] present > 24 hours  (Exception: Pain with Vomiting or Diarrhea - see that Guideline.)  Answer Assessment - Initial Assessment Questions 1. LOCATION: "Where does it hurt?"      Right lower abdominal area 2. RADIATION: "Does the pain shoot anywhere else?" (e.g., chest, back)     No  3. ONSET: "When did the pain begin?" (Minutes, hours or days ago)      Yesterday 4. SUDDEN: "Gradual or sudden onset?"     Gradual 5. PATTERN "Does the pain come and go, or is it constant?"    - If it comes and goes: "How long does it last?" "Do you have pain now?"     (Note: Comes and goes means the pain is intermittent. It goes away completely between bouts.)    - If constant: "Is it getting better, staying the same, or getting worse?"      (Note: Constant means the pain never goes away completely; most serious pain is constant and gets worse.)      Comes and goes  6. SEVERITY: "How bad is the pain?"  (e.g., Scale 1-10; mild, moderate, or  severe)    - MILD (1-3): Doesn't interfere with normal activities, abdomen soft and not tender to touch.     - MODERATE (4-7): Interferes with normal activities or awakens from sleep, abdomen tender to touch.     - SEVERE (8-10): Excruciating pain, doubled over, unable to do any normal activities.       6/10 7. RECURRENT SYMPTOM: "Have you ever had this type of stomach pain before?" If Yes, ask: "When was the last time?" and "What happened that time?"      Yes and I had appendicitis at the time  8. CAUSE: "What do you think is causing the stomach pain?"     I'm not sure  9. RELIEVING/AGGRAVATING FACTORS: "What makes it better or worse?" (e.g., antacids, bending or twisting motion, bowel movement)     Resting makes it feel better, increase in activity makes it worse  10. OTHER SYMPTOMS: "Do you have any other symptoms?" (e.g., back pain, diarrhea, fever, urination pain, vomiting)       No  Protocols used: Abdominal Pain - Male-A-AH

## 2023-02-02 ENCOUNTER — Telehealth: Payer: Self-pay | Admitting: Nurse Practitioner

## 2023-02-02 ENCOUNTER — Encounter: Payer: Self-pay | Admitting: Nurse Practitioner

## 2023-02-02 ENCOUNTER — Ambulatory Visit (INDEPENDENT_AMBULATORY_CARE_PROVIDER_SITE_OTHER): Payer: Medicare HMO | Admitting: Nurse Practitioner

## 2023-02-02 VITALS — BP 108/60 | HR 79 | Temp 97.6°F | Wt 184.6 lb

## 2023-02-02 DIAGNOSIS — R1031 Right lower quadrant pain: Secondary | ICD-10-CM

## 2023-02-02 DIAGNOSIS — Z1211 Encounter for screening for malignant neoplasm of colon: Secondary | ICD-10-CM

## 2023-02-02 NOTE — Assessment & Plan Note (Signed)
Acute for 3 days, but some improvement today.  Noted possible right inguinal hernia on exam, he reports he has been pushing this back in.  ?cause for discomfort.  Overall no red flags on exam and does not have appendix.  Will obtain ultrasound to further assess for hernia, if present will place referral to general surgery to discuss further.  Continue Tylenol as needed for pain and ensure wearing tight underwear for support.  No heavy lifting until imaging results return.

## 2023-02-02 NOTE — Patient Instructions (Signed)
Abdominal Pain, Adult  Many things can cause belly (abdominal) pain. In most cases, belly pain is not a serious problem and can be watched and treated at home. But in some cases, it can be serious. Your doctor will try to find the cause of your belly pain. Follow these instructions at home: Medicines Take over-the-counter and prescription medicines only as told by your doctor. Do not take medicines that help you poop (laxatives) unless told by your doctor. General instructions Watch your belly pain for any changes. Tell your doctor if the pain gets worse. Drink enough fluid to keep your pee (urine) pale yellow. Contact a doctor if: Your belly pain changes or gets worse. You have very bad cramping or bloating in your belly. You vomit. Your pain gets worse with meals, after eating, or with certain foods. You have trouble pooping or have watery poop for more than 2-3 days. You are not hungry, or you lose weight without trying. You have signs of not getting enough fluid or water (dehydration). These may include: Dark pee, very little pee, or no pee. Cracked lips or dry mouth. Feeling sleepy or weak. You have pain when you pee or poop. Your belly pain wakes you up at night. You have blood in your pee. You have a fever. Get help right away if: You cannot stop vomiting. Your pain is only in one part of your belly, like on the right side. You have bloody or black poop, or poop that looks like tar. You have trouble breathing. You have chest pain. These symptoms may be an emergency. Get help right away. Call 911. Do not wait to see if the symptoms will go away. Do not drive yourself to the hospital. This information is not intended to replace advice given to you by your health care provider. Make sure you discuss any questions you have with your health care provider. Document Revised: 03/01/2022 Document Reviewed: 03/01/2022 Elsevier Patient Education  2024 Elsevier Inc.  

## 2023-02-02 NOTE — Progress Notes (Signed)
BP 108/60   Pulse 79   Temp 97.6 F (36.4 C) (Oral)   Wt 184 lb 9.6 oz (83.7 kg)   SpO2 95%   BMI 26.49 kg/m    Subjective:    Patient ID: Derek Garza, male    DOB: 1960-10-11, 62 y.o.   MRN: [id number]  HPI: Derek Garza is a 61 y.o. male  Chief Complaint  Patient presents with   Abdominal Pain    Pt states he started having lower abdominal pain about 3 days ago. States he has had constant pain until today. States he feels like the pain is deep. States he does have a known hernia in that area on his R side.    ABDOMINAL PAIN  Started to have abdominal pain 3 days ago. Felt like a deep pain.  Reports having a known hernia to right groin area that pouches out and he can push back in.  Today he woke-up with no pain. Does not have appendix. Duration:days Onset: sudden Severity: 6/10 Quality: dull, aching, and throbbing Location:  RLQ -- does not have appendix Episode duration: for 3 days was constant Radiation: no Frequency: constant Alleviating factors: rest and Tylenol Aggravating factors: lifting Status: stable Treatments attempted: Tylenol Fever: no Nausea: no Vomiting: no Weight loss: no Decreased appetite: no Diarrhea: no Constipation: no Blood in stool: no Heartburn: no Jaundice: no Rash: no Dysuria/urinary frequency: no Hematuria: no Recurrent NSAID use: no   Relevant past medical, surgical, family and social history reviewed and updated as indicated. Interim medical history since our last visit reviewed. Allergies and medications reviewed and updated.  Review of Systems  Constitutional:  Negative for activity change, diaphoresis, fatigue and fever.  Respiratory:  Negative for cough, chest tightness, shortness of breath and wheezing.   Cardiovascular:  Negative for chest pain, palpitations and leg swelling.  Gastrointestinal:  Positive for abdominal pain. Negative for abdominal distention, blood in stool, constipation, diarrhea, nausea and  vomiting.  Neurological: Negative.   Psychiatric/Behavioral: Negative.     Per HPI unless specifically indicated above     Objective:    BP 108/60   Pulse 79   Temp 97.6 F (36.4 C) (Oral)   Wt 184 lb 9.6 oz (83.7 kg)   SpO2 95%   BMI 26.49 kg/m   Wt Readings from Last 3 Encounters:  02/02/23 184 lb 9.6 oz (83.7 kg)  10/25/22 181 lb 12.8 oz (82.5 kg)  08/14/22 183 lb 9.6 oz (83.3 kg)    Physical Exam Vitals and nursing note reviewed.  Constitutional:      General: He is awake. He is not in acute distress.    Appearance: He is well-developed and well-groomed. He is not ill-appearing or toxic-appearing.  HENT:     Head: Normocephalic.     Right Ear: Hearing and external ear normal.     Left Ear: Hearing and external ear normal.  Eyes:     General: Lids are normal.     Extraocular Movements: Extraocular movements intact.     Conjunctiva/sclera: Conjunctivae normal.  Neck:     Thyroid: No thyromegaly.     Vascular: No carotid bruit.  Cardiovascular:     Rate and Rhythm: Normal rate and regular rhythm.     Heart sounds: Normal heart sounds. No murmur heard.    No gallop.  Pulmonary:     Effort: No accessory muscle usage or respiratory distress.     Breath sounds: Normal breath sounds.  Abdominal:  General: Bowel sounds are normal. There is no distension.     Palpations: Abdomen is soft.     Tenderness: There is generalized abdominal tenderness. There is no right CVA tenderness, left CVA tenderness, guarding or rebound. Negative signs include Murphy's sign.     Hernia: A hernia is present. Hernia is present in the right inguinal area. There is no hernia in the left inguinal area.  Musculoskeletal:     Cervical back: Full passive range of motion without pain.     Right lower leg: No edema.     Left lower leg: No edema.  Lymphadenopathy:     Cervical: No cervical adenopathy.  Skin:    General: Skin is warm.     Capillary Refill: Capillary refill takes less than 2  seconds.  Neurological:     Mental Status: He is alert and oriented to person, place, and time.     Deep Tendon Reflexes: Reflexes are normal and symmetric.     Reflex Scores:      Brachioradialis reflexes are 2+ on the right side and 2+ on the left side.      Patellar reflexes are 2+ on the right side and 2+ on the left side. Psychiatric:        Attention and Perception: Attention normal.        Mood and Affect: Mood normal.        Speech: Speech normal.        Behavior: Behavior normal. Behavior is cooperative.        Thought Content: Thought content normal.     Results for orders placed or performed in visit on 10/25/22  Bayer DCA Hb A1c Waived  Result Value Ref Range   HB A1C (BAYER DCA - WAIVED) 3.2 (L) 4.8 - 5.6 %  Microalbumin, Urine Waived  Result Value Ref Range   Microalb, Ur Waived 10 0 - 19 mg/L   Creatinine, Urine Waived 10 10 - 300 mg/dL   Microalb/Creat Ratio 30-300 (H) <30 mg/g  Comprehensive metabolic panel  Result Value Ref Range   Glucose 115 (H) 70 - 99 mg/dL   BUN 12 8 - 27 mg/dL   Creatinine, Ser 3.08 0.76 - 1.27 mg/dL   eGFR 96 >65 HQ/ION/6.29   BUN/Creatinine Ratio 13 10 - 24   Sodium 136 134 - 144 mmol/L   Potassium 4.3 3.5 - 5.2 mmol/L   Chloride 94 (L) 96 - 106 mmol/L   CO2 20 20 - 29 mmol/L   Calcium 9.2 8.6 - 10.2 mg/dL   Total Protein 7.2 6.0 - 8.5 g/dL   Albumin 4.6 3.9 - 4.9 g/dL   Globulin, Total 2.6 1.5 - 4.5 g/dL   Albumin/Globulin Ratio 1.8 1.2 - 2.2   Bilirubin Total 0.8 0.0 - 1.2 mg/dL   Alkaline Phosphatase 148 (H) 44 - 121 IU/L   AST 32 0 - 40 IU/L   ALT 15 0 - 44 IU/L  Lipid Panel w/o Chol/HDL Ratio  Result Value Ref Range   Cholesterol, Total 158 100 - 199 mg/dL   Triglycerides 77 0 - 149 mg/dL   HDL 63 >52 mg/dL   VLDL Cholesterol Cal 15 5 - 40 mg/dL   LDL Chol Calc (NIH) 80 0 - 99 mg/dL      Assessment & Plan:   Problem List Items Addressed This Visit       Other   Right lower quadrant abdominal pain - Primary     Acute for 3 days,  but some improvement today.  Noted possible right inguinal hernia on exam, he reports he has been pushing this back in.  ?cause for discomfort.  Overall no red flags on exam and does not have appendix.  Will obtain ultrasound to further assess for hernia, if present will place referral to general surgery to discuss further.  Continue Tylenol as needed for pain and ensure wearing tight underwear for support.  No heavy lifting until imaging results return.      Relevant Orders   US SCROTUM W/DOPPLER   Other Visit Diagnoses     Colon cancer screening       Referral to GI placed.   Relevant Orders   Ambulatory referral to Gastroenterology        Follow up plan: Return for as scheduled December 10th.

## 2023-02-02 NOTE — Telephone Encounter (Signed)
Patient left form at front desk for Disability Parking Placard to be completed by Aura Dials, NP. Patient requesting Permanent Disability Placard. I am placing form in providers folder. Patient request call when complete. [phone]

## 2023-02-05 NOTE — Telephone Encounter (Signed)
Form placed in providers folder for signature

## 2023-02-06 NOTE — Telephone Encounter (Signed)
Form completed. Copy placed in scan bin and original placed up front for patient to pick up.   Called and notified patient that form was ready to be picked up.

## 2023-02-07 ENCOUNTER — Other Ambulatory Visit: Payer: Self-pay | Admitting: Nurse Practitioner

## 2023-02-07 DIAGNOSIS — K4090 Unilateral inguinal hernia, without obstruction or gangrene, not specified as recurrent: Secondary | ICD-10-CM

## 2023-02-07 DIAGNOSIS — Z1211 Encounter for screening for malignant neoplasm of colon: Secondary | ICD-10-CM

## 2023-02-07 DIAGNOSIS — R1031 Right lower quadrant pain: Secondary | ICD-10-CM

## 2023-02-08 ENCOUNTER — Ambulatory Visit
Admission: RE | Admit: 2023-02-08 | Discharge: 2023-02-08 | Disposition: A | Discharge status: Home / Self Care | Payer: Medicare HMO | Source: Ambulatory Visit | Attending: Nurse Practitioner | Admitting: Nurse Practitioner

## 2023-02-08 ENCOUNTER — Telehealth: Payer: Self-pay

## 2023-02-08 DIAGNOSIS — K4090 Unilateral inguinal hernia, without obstruction or gangrene, not specified as recurrent: Secondary | ICD-10-CM | POA: Diagnosis not present

## 2023-02-08 DIAGNOSIS — R1031 Right lower quadrant pain: Secondary | ICD-10-CM | POA: Diagnosis not present

## 2023-02-08 DIAGNOSIS — R102 Pelvic and perineal pain: Secondary | ICD-10-CM | POA: Diagnosis not present

## 2023-02-08 NOTE — Telephone Encounter (Signed)
Pt returning call to schedule colonoscopy.

## 2023-02-15 ENCOUNTER — Other Ambulatory Visit: Payer: Self-pay | Admitting: *Deleted

## 2023-02-15 ENCOUNTER — Telehealth: Payer: Self-pay | Admitting: *Deleted

## 2023-02-15 DIAGNOSIS — Z1211 Encounter for screening for malignant neoplasm of colon: Secondary | ICD-10-CM

## 2023-02-15 MED ORDER — PEG 3350-KCL-NABCB-NACL-NASULF 236 G PO SOLR: 4000.0000 mL | Freq: Once | ORAL | 0 refills | Status: AC

## 2023-02-15 NOTE — Telephone Encounter (Signed)
Gastroenterology Pre-Procedure Review  Request Date: 03/28/2023 Requesting Physician: Dr. Servando Snare  PATIENT REVIEW QUESTIONS: The patient responded to the following health history questions as indicated:    1. Are you having any GI issues? no 2. Do you have a personal history of Polyps? no 3. Do you have a family history of Colon Cancer or Polyps? no 4. Diabetes Mellitus?  Not anymore but still taking Jardiance for other issue 5. Joint replacements in the past 12 months?no 6. Major health problems in the past 3 months?no 7. Any artificial heart valves, MVP, or defibrillator?no    MEDICATIONS & ALLERGIES:    Patient reports the following regarding taking any anticoagulation/antiplatelet therapy:   Plavix, Coumadin, Eliquis, Xarelto, Lovenox, Pradaxa, Brilinta, or Effient? no Aspirin? no  Patient confirms/reports the following medications:  Current Outpatient Medications  Medication Sig Dispense Refill   atorvastatin (LIPITOR) 10 MG tablet Take 1 tablet (10 mg total) by mouth daily. 90 tablet 4   carbamazepine (TEGRETOL XR) 200 MG 12 hr tablet 200 mg 2 (two) times daily.     cyclobenzaprine (FLEXERIL) 10 MG tablet take 1 tablet by oral route 3 times a day as needed for mscle spasm     empagliflozin (JARDIANCE) 25 MG TABS tablet Take 1 tablet (25 mg total) by mouth daily. 90 tablet 4   furosemide (LASIX) 80 MG tablet Take 1 tablet (80 mg total) by mouth daily. 90 tablet 4   gabapentin (NEURONTIN) 600 MG tablet Take 1 tablet (600 mg total) by mouth 2 (two) times daily. 180 tablet 4   potassium chloride (KLOR-CON M10) 10 MEQ tablet TAKE ONE TABLET BY MOUTH DAILY 90 tablet 3   potassium chloride (KLOR-CON) 10 MEQ tablet Take 1 tablet (10 mEq total) by mouth daily. 90 tablet 4   QUEtiapine (SEROQUEL) 400 MG tablet Take 400 mg by mouth daily.      tadalafil (CIALIS) 20 MG tablet Take 1 tablet (20 mg total) by mouth daily as needed for erectile dysfunction. 100 tablet 0   No current  facility-administered medications for this visit.    Patient confirms/reports the following allergies:  Allergies  Allergen Reactions   Acetaminophen    Oxycodone Hcl    Percocet [Oxycodone-Acetaminophen] Nausea Only    No orders of the defined types were placed in this encounter.   AUTHORIZATION INFORMATION Primary Insurance: 1D#: Group #:  Secondary Insurance: 1D#: Group #:  SCHEDULE INFORMATION: Date: 03/28/2023 Time: Location:  [facility]

## 2023-02-19 ENCOUNTER — Telehealth: Payer: Self-pay | Admitting: Nurse Practitioner

## 2023-02-19 NOTE — Telephone Encounter (Unsigned)
Copied from CRM [id number]. Topic: General - Other >> Feb 19, 2023  9:47 AM Everette C wrote: Reason for CRM: The patient would like to be contacted by a member of staff when possible to review their ultrasound from 02/08/23 The patient has stressed the urgency of their request  Please contact the patient further when possible

## 2023-02-20 NOTE — Telephone Encounter (Signed)
According to chart, report has not been read. Called over to [facility] to check on results as imaging was done 02/08/23. Ultrasound department stated that they would call back to radiology and ask for this to be pushed to the front of the results as it has been 12 days.   Called and notified patient of the above. Advised patient we would let him know as soon as we know something on his imaging. Patient verbalized understanding.

## 2023-02-22 NOTE — Progress Notes (Signed)
Contacted via MyChart - but please call, he is not consistent with checking  Good evening Mondarius, your imaging did return noting a bowel containing right inguinal hernia.  If this is bother you I would be glad to get you into general surgery to discuss.  Would you like to see them?  Let me know.:) Keep being awesome!!  Thank you for allowing me to participate in your care.  I appreciate you. Kindest regards, Levern Pitter

## 2023-02-23 ENCOUNTER — Other Ambulatory Visit: Payer: Self-pay | Admitting: Nurse Practitioner

## 2023-02-23 DIAGNOSIS — Z8719 Personal history of other diseases of the digestive system: Secondary | ICD-10-CM | POA: Insufficient documentation

## 2023-02-23 DIAGNOSIS — K4090 Unilateral inguinal hernia, without obstruction or gangrene, not specified as recurrent: Secondary | ICD-10-CM | POA: Insufficient documentation

## 2023-02-23 NOTE — Progress Notes (Signed)
Right inguinal hernia with pain, patient wishes referral to surgery.

## 2023-03-01 ENCOUNTER — Ambulatory Visit: Payer: BLUE CROSS/BLUE SHIELD | Admitting: General Surgery

## 2023-03-01 DIAGNOSIS — Z79899 Other long term (current) drug therapy: Secondary | ICD-10-CM | POA: Diagnosis not present

## 2023-03-01 DIAGNOSIS — F3175 Bipolar disorder, in partial remission, most recent episode depressed: Secondary | ICD-10-CM | POA: Diagnosis not present

## 2023-03-01 DIAGNOSIS — F411 Generalized anxiety disorder: Secondary | ICD-10-CM | POA: Diagnosis not present

## 2023-03-06 ENCOUNTER — Encounter: Payer: Self-pay | Admitting: General Surgery

## 2023-03-06 ENCOUNTER — Ambulatory Visit: Payer: Managed Care, Other (non HMO) | Admitting: General Surgery

## 2023-03-06 ENCOUNTER — Ambulatory Visit: Payer: Self-pay | Admitting: General Surgery

## 2023-03-06 VITALS — BP 105/70 | HR 82 | Ht 72.0 in | Wt 180.4 lb

## 2023-03-06 DIAGNOSIS — K4090 Unilateral inguinal hernia, without obstruction or gangrene, not specified as recurrent: Secondary | ICD-10-CM | POA: Diagnosis not present

## 2023-03-06 DIAGNOSIS — K4091 Unilateral inguinal hernia, without obstruction or gangrene, recurrent: Secondary | ICD-10-CM

## 2023-03-06 NOTE — Progress Notes (Signed)
Patient ID: Derek Garza, male   DOB: August 17, 1960, 62 y.o.   MRN: [id number]  History of Present Illness Derek Garza is a 62 y.o. male is in evaluation for right inguinal hernia.  The patient reports that over the last 2 months he has noticed pain in his right groin that radiates to his intermedial thigh.  This was also associated with a large bulge in his right groin.  He denies any evidence of of bulge or pain in the left groin.  He says that he is always able to reduce the bulge without problems.  He denies any obstructive symptoms including no nausea, vomiting or obstipation.  He also denies any overlying skin changes.  Past Medical History Past Medical History:  Diagnosis Date   Diabetes mellitus without complication (HCC)    Hypertension       Past Surgical History:  Procedure Laterality Date   ANKLE SURGERY  05/29/1977   APPENDECTOMY  05/29/2009   CERVICAL SPINE SURGERY     HERNIA REPAIR  4010,2725   SPINE SURGERY  10/2018   VASECTOMY    Ventral hernia repair Roux-en-Y gastric bypass  Allergies  Allergen Reactions   Acetaminophen    Oxycodone Hcl    Percocet [Oxycodone-Acetaminophen] Nausea Only    Current Outpatient Medications  Medication Sig Dispense Refill   atorvastatin (LIPITOR) 10 MG tablet Take 1 tablet (10 mg total) by mouth daily. 90 tablet 4   carbamazepine (TEGRETOL XR) 200 MG 12 hr tablet 200 mg 2 (two) times daily.     cyclobenzaprine (FLEXERIL) 10 MG tablet take 1 tablet by oral route 3 times a day as needed for mscle spasm     empagliflozin (JARDIANCE) 25 MG TABS tablet Take 1 tablet (25 mg total) by mouth daily. 90 tablet 4   furosemide (LASIX) 80 MG tablet Take 1 tablet (80 mg total) by mouth daily. 90 tablet 4   gabapentin (NEURONTIN) 600 MG tablet Take 1 tablet (600 mg total) by mouth 2 (two) times daily. 180 tablet 4   potassium chloride (KLOR-CON M10) 10 MEQ tablet TAKE ONE TABLET BY MOUTH DAILY 90 tablet 3   potassium chloride (KLOR-CON) 10 MEQ  tablet Take 1 tablet (10 mEq total) by mouth daily. 90 tablet 4   QUEtiapine (SEROQUEL) 400 MG tablet Take 400 mg by mouth daily.      tadalafil (CIALIS) 20 MG tablet Take 1 tablet (20 mg total) by mouth daily as needed for erectile dysfunction. 100 tablet 0   No current facility-administered medications for this visit.    Family History Family History  Problem Relation Age of Onset   Breast cancer Sister        Social History Social History   Tobacco Use   Smoking status: Never   Smokeless tobacco: Never  Vaping Use   Vaping status: Never Used  Substance Use Topics   Alcohol use: Yes    Comment: on occasion   Drug use: No    Says he does smoke occasionally a few cigarettes per week   ROS Full ROS of systems performed and is otherwise negative there than what is stated in the HPI  Physical Exam Blood pressure 105/70, pulse 82, height 6' (1.829 m), weight 180 lb 6.4 oz (81.8 kg), SpO2 (!) 86%.  Acute distress, PERRLA, normal work of breathing on room air, abdomen is soft there is well-healed incisions particularly infraumbilically.  His abdomen is nontender. On groin exam being up he has an obvious bulge in his  right groin.  This is easily reducible.  He has no evidence of hernia on the left side.  When laying down the hernia contents reduced spontaneously.  There is no overlying skin changes Data Reviewed I have reviewed his ultrasound and it shows evidence of a right inguinal hernia.  His last A1c was 0.2 back in May.  I have personally reviewed the patient's imaging and medical records.    Assessment [name] is a 62 year old male with a reducible right inguinal hernia with bowel contents in the hernia sac.  Plan The patient is unsure where his ventral hernia mesh is and has extensive abdominal surgical history so we will plan for an open right inguinal hernia repair.  I discussed with him the risk of infection, bleeding, damage to the vas deferens as well as  chronic pain and recurrence of the hernia.  He understands these risks and wishes to proceed   Kandis Cocking 03/06/2023, 10:09 AM

## 2023-03-06 NOTE — Patient Instructions (Signed)
You have chose to have your hernia repaired. This will be done by Dr. Maurine Minister at [facility].  Please see your (blue) Pre-care information that you have been given today. Our surgery scheduler will call you to verify surgery date and to go over information.   You will need to arrange to be out of work for approximately 1-2 weeks and then you may return with a lifting restriction for 4 more weeks. If you have FMLA or Disability paperwork that needs to be filled out, please have your company fax your paperwork to [phone] or you may drop this by either office. This paperwork will be filled out within 3 days after your surgery has been completed.  You may have a bruise in your groin and also swelling and brusing in your testicle area. You may use ice 4-5 times daily for 15-20 minutes each time. Make sure that you place a barrier between you and the ice pack. To decrease the swelling, you may roll up a bath towel and place it vertically in between your thighs with your testicles resting on the towel. You will want to keep this area elevated as much as possible for several days following surgery.    Inguinal Hernia, Adult Muscles help keep everything in the body in its proper place. But if a weak spot in the muscles develops, something can poke through. That is called a hernia. When this happens in the lower part of the belly (abdomen), it is called an inguinal hernia. (It takes its name from a part of the body in this region called the inguinal canal.) A weak spot in the wall of muscles lets some fat or part of the small intestine bulge through. An inguinal hernia can develop at any age. Men get them more often than women. CAUSES  In adults, an inguinal hernia develops over time. It can be triggered by: Suddenly straining the muscles of the lower abdomen. Lifting heavy objects. Straining to have a bowel movement. Difficult bowel movements (constipation) can lead to this. Constant coughing. This may be  caused by smoking or lung disease. Being overweight. Being pregnant. Working at a job that requires long periods of standing or heavy lifting. Having had an inguinal hernia before. One type can be an emergency situation. It is called a strangulated inguinal hernia. It develops if part of the small intestine slips through the weak spot and cannot get back into the abdomen. The blood supply can be cut off. If that happens, part of the intestine may die. This situation requires emergency surgery. SYMPTOMS  Often, a small inguinal hernia has no symptoms. It is found when a healthcare provider does a physical exam. Larger hernias usually have symptoms.  In adults, symptoms may include: A lump in the groin. This is easier to see when the person is standing. It might disappear when lying down. In men, a lump in the scrotum. Pain or burning in the groin. This occurs especially when lifting, straining or coughing. A dull ache or feeling of pressure in the groin. Signs of a strangulated hernia can include: A bulge in the groin that becomes very painful and tender to the touch. A bulge that turns red or purple. Fever, nausea and vomiting. Inability to have a bowel movement or to pass gas. DIAGNOSIS  To decide if you have an inguinal hernia, a healthcare provider will probably do a physical examination. This will include asking questions about any symptoms you have noticed. The healthcare provider might feel  the groin area and ask you to cough. If an inguinal hernia is felt, the healthcare provider may try to slide it back into the abdomen. Usually no other tests are needed. TREATMENT  Treatments can vary. The size of the hernia makes a difference. Options include: Watchful waiting. This is often suggested if the hernia is small and you have had no symptoms. No medical procedure will be done unless symptoms develop. You will need to watch closely for symptoms. If any occur, contact your healthcare  provider right away. Surgery. This is used if the hernia is larger or you have symptoms. Open surgery. This is usually an outpatient procedure (you will not stay overnight in a hospital). An cut (incision) is made through the skin in the groin. The hernia is put back inside the abdomen. The weak area in the muscles is then repaired by herniorrhaphy or hernioplasty. Herniorrhaphy: in this type of surgery, the weak muscles are sewn back together. Hernioplasty: a patch or mesh is used to close the weak area in the abdominal wall. Laparoscopy. In this procedure, a surgeon makes small incisions. A thin tube with a tiny video camera (called a laparoscope) is put into the abdomen. The surgeon repairs the hernia with mesh by looking with the video camera and using two long instruments. HOME CARE INSTRUCTIONS  After surgery to repair an inguinal hernia: You will need to take pain medicine prescribed by your healthcare provider. Follow all directions carefully. You will need to take care of the wound from the incision. Your activity will be restricted for awhile. This will probably include no heavy lifting for several weeks. You also should not do anything too active for a few weeks. When you can return to work will depend on the type of job that you have. During "watchful waiting" periods, you should: Maintain a healthy weight. Eat a diet high in fiber (fruits, vegetables and whole grains). Drink plenty of fluids to avoid constipation. This means drinking enough water and other liquids to keep your urine clear or pale yellow. Do not lift heavy objects. Do not stand for long periods of time. Quit smoking. This should keep you from developing a frequent cough. SEEK MEDICAL CARE IF:  A bulge develops in your groin area. You feel pain, a burning sensation or pressure in the groin. This might be worse if you are lifting or straining. You develop a fever of more than 100.5 F (38.1 C). SEEK IMMEDIATE MEDICAL  CARE IF:  Pain in the groin increases suddenly. A bulge in the groin gets bigger suddenly and does not go down. For men, there is sudden pain in the scrotum. Or, the size of the scrotum increases. A bulge in the groin area becomes red or purple and is painful to touch. You have nausea or vomiting that does not go away. You feel your heart beating much faster than normal. You cannot have a bowel movement or pass gas. You develop a fever of more than 102.0 F (38.9 C).   This information is not intended to replace advice given to you by your health care provider. Make sure you discuss any questions you have with your health care provider.   Document Released: 10/01/2008 Document Revised: 08/07/2011 Document Reviewed: 11/16/2014 Elsevier Interactive Patient Education [employer]

## 2023-03-08 ENCOUNTER — Encounter
Admission: RE | Admit: 2023-03-08 | Discharge: 2023-03-08 | Disposition: A | Discharge status: Home / Self Care | Payer: Medicare HMO | Source: Ambulatory Visit | Attending: General Surgery | Admitting: General Surgery

## 2023-03-08 ENCOUNTER — Other Ambulatory Visit: Payer: Self-pay

## 2023-03-08 DIAGNOSIS — K4090 Unilateral inguinal hernia, without obstruction or gangrene, not specified as recurrent: Secondary | ICD-10-CM

## 2023-03-08 DIAGNOSIS — E1142 Type 2 diabetes mellitus with diabetic polyneuropathy: Secondary | ICD-10-CM

## 2023-03-08 DIAGNOSIS — I152 Hypertension secondary to endocrine disorders: Secondary | ICD-10-CM

## 2023-03-08 HISTORY — DX: Unilateral inguinal hernia, without obstruction or gangrene, not specified as recurrent: K40.90

## 2023-03-08 HISTORY — DX: Male erectile dysfunction, unspecified: N52.9

## 2023-03-08 HISTORY — DX: Paresthesia of skin: R20.2

## 2023-03-08 HISTORY — DX: Unspecified abnormalities of gait and mobility: R26.9

## 2023-03-08 HISTORY — DX: Myoneural disorder, unspecified: G70.9

## 2023-03-08 HISTORY — DX: Hyperlipidemia, unspecified: E78.5

## 2023-03-08 HISTORY — DX: Bipolar disorder, unspecified: F31.9

## 2023-03-08 HISTORY — DX: Polyneuropathy, unspecified: G62.9

## 2023-03-08 HISTORY — DX: Disease of spinal cord, unspecified: G95.9

## 2023-03-08 NOTE — Pre-Procedure Instructions (Signed)
Patient pre-anesthesia interview completed. Patient was made aware that responsible person needs to drive him home and stay with him 24 hours post surgery. Patient verbalized understanding.

## 2023-03-08 NOTE — Patient Instructions (Addendum)
Your procedure is scheduled on: 03/16/2023 Friday Report to the Registration Desk on the 1st floor of the [facility]. To find out your arrival time, please call [phone] between 1PM - 3PM on: If your arrival time is 6:00 am, do not arrive before that time as the [facility] entrance doors do not open until 6:00 am.  REMEMBER: Instructions that are not followed completely may result in serious medical risk, up to and including death; or upon the discretion of your surgeon and anesthesiologist your surgery may need to be rescheduled.  Do not eat food after midnight the night before surgery.  No gum chewing or hard candies.    Stop Anti-inflammatories (NSAIDS) such as Advil, Aleve, Ibuprofen, Motrin, Naproxen, Naprosyn and Aspirin based products such as Excedrin, Goody's Powder, BC Powder. Stop ANY OVER THE COUNTER supplements until after surgery.  You may however, continue to take Tylenol if needed for pain up until the day of surgery.   Continue taking all of your other prescription medications up until the day of surgery except the following : -     Jardiance  -hold 3 days prior to surgery.   Last dose should be October 14      Cialis-  hold two days before surgery-     Last dose is October 15  ON THE DAY OF SURGERY ONLY TAKE THESE MEDICATIONS WITH SIPS OF WATER:  atorvastatin (LIPITOR)  gabapentin (NEURONTIN) potassium chloride (KLOR-CON M10) QUEtiapine (SEROQUEL)      No Alcohol for 24 hours before or after surgery.  No Smoking including e-cigarettes for 24 hours before surgery.  No chewable tobacco products for at least 6 hours before surgery.  No nicotine patches on the day of surgery.  Do not use any "recreational" drugs for at least a week (preferably 2 weeks) before your surgery.  Please be advised that the combination of cocaine and anesthesia may have negative outcomes, up to and including death. If you test positive for cocaine, your surgery will be  cancelled.  On the morning of surgery brush your teeth with toothpaste and water, you may rinse your mouth with mouthwash if you wish. Do not swallow any toothpaste or mouthwash.  Use CHG Soap or wipes as directed on instruction sheet.-provided for you   Do not wear jewelry, make-up, hairpins, clips or nail polish.  For welded (permanent) jewelry: bracelets, anklets, waist bands, etc.  Please have this removed prior to surgery.  If it is not removed, there is a chance that hospital personnel will need to cut it off on the day of surgery.  Do not wear lotions, powders, or perfumes.   Do not shave body hair from the neck down 48 hours before surgery.  Contact lenses, hearing aids and dentures may not be worn into surgery.  Do not bring valuables to the hospital. [facility] is not responsible for any missing/lost belongings or valuables.    Notify your doctor if there is any change in your medical condition (cold, fever, infection).  Wear comfortable clothing (specific to your surgery type) to the hospital.  After surgery, you can help prevent lung complications by doing breathing exercises.  Take deep breaths and cough every 1-2 hours. Your doctor may order a device called an Incentive Spirometer to help you take deep breaths. If you are being admitted to the hospital overnight, leave your suitcase in the car. After surgery it may be brought to your room.   If you are being discharged the  day of surgery, you will not be allowed to drive home. You will need a responsible individual to drive you home and stay with you for 24 hours after surgery.    Please call the Pre-admissions Testing Dept. at [phone] if you have any questions about these instructions.  Surgery Visitation Policy:  Patients having surgery or a procedure may have two visitors.  Children under the age of 22 must have an adult with them who is not the patient.  Inpatient Visitation:    Visiting hours are 7  a.m. to 8 p.m. Up to four visitors are allowed at one time in a patient room. The visitors may rotate out with other people during the day.  One visitor age 31 or older may stay with the patient overnight and must be in the room by 8 p.m.     Preparing for Surgery with CHLORHEXIDINE GLUCONATE (CHG) Soap  Chlorhexidine Gluconate (CHG) Soap  o An antiseptic cleaner that kills germs and bonds with the skin to continue killing germs even after washing  o Used for showering the night before surgery and morning of surgery  Before surgery, you can play an important role by reducing the number of germs on your skin.  CHG (Chlorhexidine gluconate) soap is an antiseptic cleanser which kills germs and bonds with the skin to continue killing germs even after washing.  Please do not use if you have an allergy to CHG or antibacterial soaps. If your skin becomes reddened/irritated stop using the CHG.  1. Shower the NIGHT BEFORE SURGERY and the MORNING OF SURGERY with CHG soap.  2. If you choose to wash your hair, wash your hair first as usual with your normal shampoo.  3. After shampooing, rinse your hair and body thoroughly to remove the shampoo.  4. Use CHG as you would any other liquid soap. You can apply CHG directly to the skin and wash gently with a scrungie or a clean washcloth.  5. Apply the CHG soap to your body only from the neck down. Do not use on open wounds or open sores. Avoid contact with your eyes, ears, mouth, and genitals (private parts). Wash face and genitals (private parts) with your normal soap.  6. Wash thoroughly, paying special attention to the area where your surgery will be performed.  7. Thoroughly rinse your body with warm water.  8. Do not shower/wash with your normal soap after using and rinsing off the CHG soap.  9. Pat yourself dry with a clean towel.  10. Wear clean pajamas to bed the night before surgery.  12. Place clean sheets on your bed the night of  your first shower and do not sleep with pets.  13. Shower again with the CHG soap on the day of surgery prior to arriving at the hospital.  14. Do not apply any deodorants/lotions/powders.  15. Please wear clean clothes to the hospital.

## 2023-03-13 ENCOUNTER — Encounter
Admission: RE | Admit: 2023-03-13 | Discharge: 2023-03-13 | Disposition: A | Discharge status: Home / Self Care | Payer: Medicare HMO | Source: Ambulatory Visit | Attending: General Surgery | Admitting: General Surgery

## 2023-03-13 ENCOUNTER — Encounter: Payer: Self-pay | Admitting: Urgent Care

## 2023-03-13 DIAGNOSIS — Z01818 Encounter for other preprocedural examination: Secondary | ICD-10-CM | POA: Diagnosis not present

## 2023-03-13 DIAGNOSIS — E1159 Type 2 diabetes mellitus with other circulatory complications: Secondary | ICD-10-CM | POA: Insufficient documentation

## 2023-03-13 DIAGNOSIS — I152 Hypertension secondary to endocrine disorders: Secondary | ICD-10-CM

## 2023-03-13 DIAGNOSIS — Z01810 Encounter for preprocedural cardiovascular examination: Secondary | ICD-10-CM | POA: Diagnosis not present

## 2023-03-13 DIAGNOSIS — K4090 Unilateral inguinal hernia, without obstruction or gangrene, not specified as recurrent: Secondary | ICD-10-CM

## 2023-03-13 DIAGNOSIS — E1142 Type 2 diabetes mellitus with diabetic polyneuropathy: Secondary | ICD-10-CM

## 2023-03-13 LAB — BASIC METABOLIC PANEL
Anion gap: 10 (ref 5–15)
BUN: 19 mg/dL (ref 8–23)
CO2: 25 mmol/L (ref 22–32)
Calcium: 8.8 mg/dL — ABNORMAL LOW (ref 8.9–10.3)
Chloride: 95 mmol/L — ABNORMAL LOW (ref 98–111)
Creatinine, Ser: 0.95 mg/dL (ref 0.61–1.24)
GFR, Estimated: >60 mL/min (ref >60)
Glucose, Bld: 108 mg/dL — ABNORMAL HIGH (ref 70–99)
Potassium: 3.4 mmol/L — ABNORMAL LOW (ref 3.5–5.1)
Sodium: 130 mmol/L — ABNORMAL LOW (ref 135–145)

## 2023-03-13 LAB — CBC
HCT: 35.6 % — ABNORMAL LOW (ref 39.0–52.0)
Hemoglobin: 11.6 g/dL — ABNORMAL LOW (ref 13.0–17.0)
MCH: 33.2 pg (ref 26.0–34.0)
MCHC: 32.6 g/dL (ref 30.0–36.0)
MCV: 102 fL — ABNORMAL HIGH (ref 80.0–100.0)
Platelets: 306 10*3/uL (ref 150–400)
RBC: 3.49 MIL/uL — ABNORMAL LOW (ref 4.22–5.81)
RDW: 19.2 % — ABNORMAL HIGH (ref 11.5–15.5)
WBC: 10 10*3/uL (ref 4.0–10.5)
nRBC: 0 % (ref 0.0–0.2)

## 2023-03-16 ENCOUNTER — Other Ambulatory Visit: Payer: Self-pay

## 2023-03-16 ENCOUNTER — Ambulatory Visit: Payer: Medicare HMO | Admitting: Urgent Care

## 2023-03-16 ENCOUNTER — Ambulatory Visit: Payer: Medicare HMO | Admitting: Anesthesiology

## 2023-03-16 ENCOUNTER — Encounter
Admission: RE | Disposition: A | Discharge status: Home / Self Care | Payer: Self-pay | Source: Home / Self Care | Attending: General Surgery

## 2023-03-16 ENCOUNTER — Other Ambulatory Visit: Payer: Self-pay | Admitting: Nurse Practitioner

## 2023-03-16 ENCOUNTER — Encounter: Payer: Self-pay | Admitting: General Surgery

## 2023-03-16 ENCOUNTER — Telehealth: Payer: Self-pay | Admitting: Nurse Practitioner

## 2023-03-16 ENCOUNTER — Ambulatory Visit
Admission: RE | Admit: 2023-03-16 | Discharge: 2023-03-16 | Disposition: A | Discharge status: Home / Self Care | Payer: Medicare HMO | Attending: General Surgery | Admitting: General Surgery

## 2023-03-16 DIAGNOSIS — I10 Essential (primary) hypertension: Secondary | ICD-10-CM | POA: Diagnosis not present

## 2023-03-16 DIAGNOSIS — Z7984 Long term (current) use of oral hypoglycemic drugs: Secondary | ICD-10-CM | POA: Diagnosis not present

## 2023-03-16 DIAGNOSIS — E119 Type 2 diabetes mellitus without complications: Secondary | ICD-10-CM | POA: Diagnosis not present

## 2023-03-16 DIAGNOSIS — K4091 Unilateral inguinal hernia, without obstruction or gangrene, recurrent: Secondary | ICD-10-CM

## 2023-03-16 DIAGNOSIS — K4090 Unilateral inguinal hernia, without obstruction or gangrene, not specified as recurrent: Secondary | ICD-10-CM | POA: Insufficient documentation

## 2023-03-16 DIAGNOSIS — D176 Benign lipomatous neoplasm of spermatic cord: Secondary | ICD-10-CM | POA: Diagnosis not present

## 2023-03-16 DIAGNOSIS — Z79899 Other long term (current) drug therapy: Secondary | ICD-10-CM | POA: Diagnosis not present

## 2023-03-16 DIAGNOSIS — R7981 Abnormal blood-gas level: Secondary | ICD-10-CM

## 2023-03-16 DIAGNOSIS — F319 Bipolar disorder, unspecified: Secondary | ICD-10-CM | POA: Diagnosis not present

## 2023-03-16 DIAGNOSIS — G709 Myoneural disorder, unspecified: Secondary | ICD-10-CM | POA: Diagnosis not present

## 2023-03-16 DIAGNOSIS — E1142 Type 2 diabetes mellitus with diabetic polyneuropathy: Secondary | ICD-10-CM

## 2023-03-16 HISTORY — PX: INSERTION OF MESH: SHX5868

## 2023-03-16 HISTORY — PX: INGUINAL HERNIA REPAIR: SHX194

## 2023-03-16 LAB — GLUCOSE, CAPILLARY
Glucose-Capillary: 111 mg/dL — ABNORMAL HIGH (ref 70–99)
Glucose-Capillary: 121 mg/dL — ABNORMAL HIGH (ref 70–99)

## 2023-03-16 SURGERY — REPAIR, HERNIA, INGUINAL, ADULT
Anesthesia: General | Laterality: Right

## 2023-03-16 MED ORDER — LIDOCAINE HCL (PF) 2 % IJ SOLN
INTRAMUSCULAR | Status: AC
  Filled 2023-03-16: qty 5

## 2023-03-16 MED ORDER — ALBUTEROL SULFATE HFA 108 (90 BASE) MCG/ACT IN AERS
INHALATION_SPRAY | RESPIRATORY_TRACT | Status: DC | PRN
Start: 2023-03-16 — End: 2023-03-16
  Administered 2023-03-16 (×2): 6 via RESPIRATORY_TRACT

## 2023-03-16 MED ORDER — KETOROLAC TROMETHAMINE 30 MG/ML IJ SOLN
INTRAMUSCULAR | Status: DC | PRN
  Administered 2023-03-16: 30 mg via INTRAVENOUS

## 2023-03-16 MED ORDER — SUGAMMADEX SODIUM 200 MG/2ML IV SOLN
INTRAVENOUS | Status: DC | PRN
  Administered 2023-03-16: 200 mg via INTRAVENOUS

## 2023-03-16 MED ORDER — BUPIVACAINE-EPINEPHRINE (PF) 0.5% -1:200000 IJ SOLN
INTRAMUSCULAR | Status: AC
  Filled 2023-03-16: qty 10

## 2023-03-16 MED ORDER — ONDANSETRON HCL 4 MG/2ML IJ SOLN
INTRAMUSCULAR | Status: AC
  Filled 2023-03-16: qty 2

## 2023-03-16 MED ORDER — FAMOTIDINE 20 MG PO TABS
20.0000 mg | ORAL_TABLET | Freq: Once | ORAL | Status: AC
  Administered 2023-03-16: 20 mg via ORAL

## 2023-03-16 MED ORDER — PROPOFOL 10 MG/ML IV BOLUS
INTRAVENOUS | Status: DC | PRN
  Administered 2023-03-16: 50 mg via INTRAVENOUS
  Administered 2023-03-16: 120 mg via INTRAVENOUS

## 2023-03-16 MED ORDER — CHLORHEXIDINE GLUCONATE CLOTH 2 % EX PADS
6.0000 | MEDICATED_PAD | Freq: Once | CUTANEOUS | Status: AC
  Administered 2023-03-16: 6 via TOPICAL

## 2023-03-16 MED ORDER — SODIUM CHLORIDE 0.9 % IV SOLN: INTRAVENOUS | Status: DC

## 2023-03-16 MED ORDER — ROCURONIUM BROMIDE 100 MG/10ML IV SOLN
INTRAVENOUS | Status: DC | PRN
  Administered 2023-03-16: 20 mg via INTRAVENOUS
  Administered 2023-03-16: 50 mg via INTRAVENOUS

## 2023-03-16 MED ORDER — ONDANSETRON HCL 4 MG/2ML IJ SOLN: 4.0000 mg | Freq: Once | INTRAMUSCULAR | Status: DC | PRN

## 2023-03-16 MED ORDER — OXYCODONE HCL 5 MG PO TABS: 5.0000 mg | ORAL_TABLET | Freq: Four times a day (QID) | ORAL | 0 refills | Status: DC | PRN

## 2023-03-16 MED ORDER — DEXAMETHASONE SODIUM PHOSPHATE 10 MG/ML IJ SOLN
INTRAMUSCULAR | Status: DC | PRN
  Administered 2023-03-16: 5 mg via INTRAVENOUS

## 2023-03-16 MED ORDER — BUPIVACAINE LIPOSOME 1.3 % IJ SUSP
INTRAMUSCULAR | Status: AC
  Filled 2023-03-16: qty 10

## 2023-03-16 MED ORDER — PROPOFOL 10 MG/ML IV BOLUS
INTRAVENOUS | Status: AC
  Filled 2023-03-16: qty 40

## 2023-03-16 MED ORDER — FAMOTIDINE 20 MG PO TABS
ORAL_TABLET | ORAL | Status: AC
  Filled 2023-03-16: qty 1

## 2023-03-16 MED ORDER — BUPIVACAINE LIPOSOME 1.3 % IJ SUSP
INTRAMUSCULAR | Status: DC | PRN
  Administered 2023-03-16: 10 mL

## 2023-03-16 MED ORDER — CEFAZOLIN SODIUM-DEXTROSE 2-4 GM/100ML-% IV SOLN
INTRAVENOUS | Status: AC
  Filled 2023-03-16: qty 100

## 2023-03-16 MED ORDER — MIDAZOLAM HCL 2 MG/2ML IJ SOLN
INTRAMUSCULAR | Status: DC | PRN
  Administered 2023-03-16: 2 mg via INTRAVENOUS

## 2023-03-16 MED ORDER — CEFAZOLIN SODIUM-DEXTROSE 2-4 GM/100ML-% IV SOLN
2.0000 g | INTRAVENOUS | Status: AC
  Administered 2023-03-16: 2 g via INTRAVENOUS

## 2023-03-16 MED ORDER — TRAMADOL HCL 50 MG PO TABS
50.0000 mg | ORAL_TABLET | Freq: Four times a day (QID) | ORAL | 0 refills | Status: DC | PRN
Start: 2023-03-16 — End: 2023-03-16

## 2023-03-16 MED ORDER — ONDANSETRON HCL 4 MG/2ML IJ SOLN
INTRAMUSCULAR | Status: DC | PRN
  Administered 2023-03-16: 4 mg via INTRAVENOUS

## 2023-03-16 MED ORDER — MIDAZOLAM HCL 2 MG/2ML IJ SOLN
INTRAMUSCULAR | Status: AC
  Filled 2023-03-16: qty 2

## 2023-03-16 MED ORDER — FENTANYL CITRATE (PF) 100 MCG/2ML IJ SOLN
INTRAMUSCULAR | Status: AC
  Filled 2023-03-16: qty 2

## 2023-03-16 MED ORDER — BUPIVACAINE-EPINEPHRINE (PF) 0.5% -1:200000 IJ SOLN
INTRAMUSCULAR | Status: DC | PRN
  Administered 2023-03-16: 10 mL

## 2023-03-16 MED ORDER — DEXAMETHASONE SODIUM PHOSPHATE 10 MG/ML IJ SOLN
INTRAMUSCULAR | Status: AC
  Filled 2023-03-16: qty 1

## 2023-03-16 MED ORDER — LIDOCAINE HCL (CARDIAC) PF 100 MG/5ML IV SOSY
PREFILLED_SYRINGE | INTRAVENOUS | Status: DC | PRN
  Administered 2023-03-16: 100 mg via INTRAVENOUS

## 2023-03-16 MED ORDER — FENTANYL CITRATE (PF) 100 MCG/2ML IJ SOLN
INTRAMUSCULAR | Status: DC | PRN
  Administered 2023-03-16 (×2): 50 ug via INTRAVENOUS

## 2023-03-16 MED ORDER — FENTANYL CITRATE (PF) 100 MCG/2ML IJ SOLN
25.0000 ug | INTRAMUSCULAR | Status: DC | PRN
  Administered 2023-03-16 (×4): 25 ug via INTRAVENOUS

## 2023-03-16 MED ORDER — ORAL CARE MOUTH RINSE: 15.0000 mL | Freq: Once | OROMUCOSAL | Status: AC

## 2023-03-16 MED ORDER — ROCURONIUM BROMIDE 10 MG/ML (PF) SYRINGE
PREFILLED_SYRINGE | INTRAVENOUS | Status: AC
  Filled 2023-03-16: qty 10

## 2023-03-16 MED ORDER — ALBUTEROL SULFATE HFA 108 (90 BASE) MCG/ACT IN AERS
INHALATION_SPRAY | RESPIRATORY_TRACT | Status: AC
  Filled 2023-03-16: qty 6.7

## 2023-03-16 MED ORDER — KETOROLAC TROMETHAMINE 30 MG/ML IJ SOLN
INTRAMUSCULAR | Status: AC
  Filled 2023-03-16: qty 1

## 2023-03-16 MED ORDER — CHLORHEXIDINE GLUCONATE 0.12 % MT SOLN
OROMUCOSAL | Status: AC
  Filled 2023-03-16: qty 15

## 2023-03-16 MED ORDER — DEXMEDETOMIDINE HCL IN NACL 80 MCG/20ML IV SOLN
INTRAVENOUS | Status: DC | PRN
  Administered 2023-03-16 (×3): 4 ug via INTRAVENOUS

## 2023-03-16 MED ORDER — CHLORHEXIDINE GLUCONATE 0.12 % MT SOLN
15.0000 mL | Freq: Once | OROMUCOSAL | Status: AC
  Administered 2023-03-16: 15 mL via OROMUCOSAL

## 2023-03-16 SURGICAL SUPPLY — 37 items
ADH SKN CLS APL DERMABOND .7 (GAUZE/BANDAGES/DRESSINGS) ×2
APL PRP STRL LF DISP 70% ISPRP (MISCELLANEOUS) ×2
BLADE CLIPPER SURG (BLADE) ×2 IMPLANT
BLADE SURG 15 STRL LF DISP TIS (BLADE) ×2 IMPLANT
BLADE SURG 15 STRL SS (BLADE)
BRUSH SCRUB EZ 4% CHG (MISCELLANEOUS) ×2 IMPLANT
CHLORAPREP W/TINT 26 (MISCELLANEOUS) ×2 IMPLANT
DERMABOND ADVANCED .7 DNX12 (GAUZE/BANDAGES/DRESSINGS) ×2 IMPLANT
DRAIN PENROSE 12X.25 LTX STRL (MISCELLANEOUS) ×2 IMPLANT
DRAPE LAPAROTOMY 100X77 ABD (DRAPES) ×2 IMPLANT
ELECT REM PT RETURN 9FT ADLT (ELECTROSURGICAL) ×2
ELECTRODE REM PT RTRN 9FT ADLT (ELECTROSURGICAL) ×2 IMPLANT
GAUZE 4X4 16PLY ~~LOC~~+RFID DBL (SPONGE) IMPLANT
GLOVE BIOGEL PI IND STRL 7.5 (GLOVE) ×2 IMPLANT
GLOVE SURG SYN 7.0 (GLOVE) ×2 IMPLANT
GLOVE SURG SYN 7.0 PF PI (GLOVE) ×2 IMPLANT
GOWN STRL REUS W/ TWL LRG LVL3 (GOWN DISPOSABLE) ×4 IMPLANT
GOWN STRL REUS W/TWL LRG LVL3 (GOWN DISPOSABLE) ×4
LABEL OR SOLS (LABEL) ×2 IMPLANT
MANIFOLD NEPTUNE II (INSTRUMENTS) ×2 IMPLANT
MESH HERNIA 6X13 (Mesh General) IMPLANT
MESH SYNTHETIC 1.8X4 KEYHOLE S (Mesh General) IMPLANT
NDL HYPO 22X1.5 SAFETY MO (MISCELLANEOUS) ×2 IMPLANT
NEEDLE HYPO 22X1.5 SAFETY MO (MISCELLANEOUS) ×2 IMPLANT
NS IRRIG 500ML POUR BTL (IV SOLUTION) ×2 IMPLANT
PACK BASIN MINOR ARMC (MISCELLANEOUS) ×2 IMPLANT
SUT MNCRL 4-0 (SUTURE) ×2
SUT MNCRL 4-0 27XMFL (SUTURE) ×2
SUT PROLENE 2 0 SH DA (SUTURE) ×6 IMPLANT
SUT SILK 3 0 (SUTURE) ×2
SUT SILK 3-0 18XBRD TIE 12 (SUTURE) ×2 IMPLANT
SUT VIC AB 3-0 SH 27 (SUTURE) ×4
SUT VIC AB 3-0 SH 27X BRD (SUTURE) ×4 IMPLANT
SUTURE MNCRL 4-0 27XMF (SUTURE) ×2 IMPLANT
SYR 10ML LL (SYRINGE) ×2 IMPLANT
TRAP FLUID SMOKE EVACUATOR (MISCELLANEOUS) ×2 IMPLANT
WATER STERILE IRR 500ML POUR (IV SOLUTION) ×2 IMPLANT

## 2023-03-16 NOTE — H&P (Signed)
No changes to below H and P, proceed with RIGHT inguinal hernia repair with mesh.    Patient ID: Derek Garza, male   DOB: [birth year], 62 y.o.   MRN: [id number]   History of Present Illness Derek Garza is a 62 y.o. male is in evaluation for right inguinal hernia.  The patient reports that over the last 2 months he has noticed pain in his right groin that radiates to his intermedial thigh.  This was also associated with a large bulge in his right groin.  He denies any evidence of of bulge or pain in the left groin.  He says that he is always able to reduce the bulge without problems.  He denies any obstructive symptoms including no nausea, vomiting or obstipation.  He also denies any overlying skin changes.   Past Medical History     Past Medical History:  Diagnosis Date   Diabetes mellitus without complication (HCC)     Hypertension                   Past Surgical History:  Procedure Laterality Date   ANKLE SURGERY   05/29/1977   APPENDECTOMY   05/29/2009   CERVICAL SPINE SURGERY       HERNIA REPAIR   [id number]   SPINE SURGERY   10/2018   VASECTOMY          Ventral hernia repair Roux-en-Y gastric bypass   Allergies      Allergies  Allergen Reactions   Acetaminophen     Oxycodone Hcl     Percocet [Oxycodone-Acetaminophen] Nausea Only              Current Outpatient Medications  Medication Sig Dispense Refill   atorvastatin (LIPITOR) 10 MG tablet Take 1 tablet (10 mg total) by mouth daily. 90 tablet 4   carbamazepine (TEGRETOL XR) 200 MG 12 hr tablet 200 mg 2 (two) times daily.       cyclobenzaprine (FLEXERIL) 10 MG tablet take 1 tablet by oral route 3 times a day as needed for mscle spasm       empagliflozin (JARDIANCE) 25 MG TABS tablet Take 1 tablet (25 mg total) by mouth daily. 90 tablet 4   furosemide (LASIX) 80 MG tablet Take 1 tablet (80 mg total) by mouth daily. 90 tablet 4   gabapentin (NEURONTIN) 600 MG tablet Take 1 tablet (600 mg total) by mouth 2 (two)  times daily. 180 tablet 4   potassium chloride (KLOR-CON M10) 10 MEQ tablet TAKE ONE TABLET BY MOUTH DAILY 90 tablet 3   potassium chloride (KLOR-CON) 10 MEQ tablet Take 1 tablet (10 mEq total) by mouth daily. 90 tablet 4   QUEtiapine (SEROQUEL) 400 MG tablet Take 400 mg by mouth daily.        tadalafil (CIALIS) 20 MG tablet Take 1 tablet (20 mg total) by mouth daily as needed for erectile dysfunction. 100 tablet 0      No current facility-administered medications for this visit.        Family History      Family History  Problem Relation Age of Onset   Breast cancer Sister              Social History Social History  Social History         Tobacco Use   Smoking status: Never   Smokeless tobacco: Never  Vaping Use   Vaping status: Never Used  Substance Use Topics   Alcohol use: Yes  Comment: on occasion   Drug use: No      Says he does smoke occasionally a few cigarettes per week     ROS Full ROS of systems performed and is otherwise negative there than what is stated in the HPI   Physical Exam Blood pressure 105/70, pulse 82, height 6' (1.829 m), weight 180 lb 6.4 oz (81.8 kg), SpO2 (!) 86%.   Acute distress, PERRLA, normal work of breathing on room air, abdomen is soft there is well-healed incisions particularly infraumbilically.  His abdomen is nontender. On groin exam being up he has an obvious bulge in his right groin.  This is easily reducible.  He has no evidence of hernia on the left side.  When laying down the hernia contents reduced spontaneously.  There is no overlying skin changes Data Reviewed I have reviewed his ultrasound and it shows evidence of a right inguinal hernia.  His last A1c was 0.2 back in May.   I have personally reviewed the patient's imaging and medical records.     Assessment Derek Garza is a 63 year old male with a reducible right inguinal hernia with bowel contents in the hernia sac.   Plan The patient is unsure where his  ventral hernia mesh is and has extensive abdominal surgical history so we will plan for an open right inguinal hernia repair.  I discussed with him the risk of infection, bleeding, damage to the vas deferens as well as chronic pain and recurrence of the hernia.  He understands these risks and wishes to proceed     Kandis Cocking 03/06/2023, 10:09 AM

## 2023-03-16 NOTE — Anesthesia Procedure Notes (Addendum)
Procedure Name: Intubation Date/Time: 03/16/2023 7:36 AM  Performed by: Elisabeth Pigeon, CRNAPre-anesthesia Checklist: Patient identified, Patient being monitored, Timeout performed, Emergency Drugs available and Suction available Patient Re-evaluated:Patient Re-evaluated prior to induction Oxygen Delivery Method: Circle system utilized Preoxygenation: Pre-oxygenation with 100% oxygen Induction Type: IV induction Ventilation: Mask ventilation without difficulty Laryngoscope Size: Mac, McGraph and 4 Grade View: Grade I Tube type: Oral Tube size: 7.0 mm Number of attempts: 1 Airway Equipment and Method: Stylet Placement Confirmation: ETT inserted through vocal cords under direct vision, positive ETCO2 and breath sounds checked- equal and bilateral Secured at: 22 cm Tube secured with: Tape Dental Injury: Teeth and Oropharynx as per pre-operative assessment

## 2023-03-16 NOTE — Progress Notes (Signed)
Patient  with sats 85-91 percent, Dr Primitivo Gauze at bedside multiple times in the pacu , states patients sats were low preop and in pat, Patient able to maintain 88-91 sat even with pain meds

## 2023-03-16 NOTE — Progress Notes (Signed)
[facility] notified to cancel Tramadol 50 mg sent via escribe per Dr. Maurine Minister. Spoke with Aniya at [facility]. Cancellation verified.

## 2023-03-16 NOTE — Progress Notes (Signed)
Denies any history of lung disease. Preop O2 sats 90% on RA. Post anesthesia Sats ranged between low to mid 80's. States he does not use O2 at home. Denies any shortness of breath or trouble breathing. Dr Zenaida Niece Staveran/Anesthesiologist aware and spoke in detail with Derek Garza about this. He is to follow up with his Primary Dr to get a referral with a pulmonologist. Verbal order per Dr Darleene Cleaver can be discharged home with O2sat of 85%,

## 2023-03-16 NOTE — Telephone Encounter (Signed)
Copied from CRM [id number]. Topic: General - Inquiry >> Mar 16, 2023 11:52 AM De Blanch wrote: Reason for CRM:Pt stated the surgeon told him to reach out to [facility] to ask that she refer him to a pulmonologist. Oxygen saturation level was in the upper 80's at pre-op, and today was worse less than that.  Has colonoscopy October 30th. Is asking if we can find someone to see him before this. Please advise.

## 2023-03-16 NOTE — Discharge Instructions (Signed)

## 2023-03-16 NOTE — Brief Op Note (Signed)
03/16/2023  9:15 AM  PATIENT:  Derek Garza  62 y.o. male  PRE-OPERATIVE DIAGNOSIS:  right inguinal hernia  POST-OPERATIVE DIAGNOSIS:  right inguinal hernia  PROCEDURE:  Procedure(s): HERNIA REPAIR INGUINAL ADULT, open (Right) INSERTION OF MESH  SURGEON:  Surgeons and Role:    * Kandis Cocking, MD - Primary  PHYSICIAN ASSISTANT:   ASSISTANTS: none   ANESTHESIA:   regional  EBL:  5 mL   BLOOD ADMINISTERED:none  DRAINS: none   LOCAL MEDICATIONS USED:  MARCAINE    and BUPIVICAINE   SPECIMEN:  No Specimen  DISPOSITION OF SPECIMEN:  N/A  COUNTS:  YES  TOURNIQUET:  * No tourniquets in log *  DICTATION: .Dragon Dictation  PLAN OF CARE: Discharge to home after PACU  PATIENT DISPOSITION:  PACU - hemodynamically stable.   Delay start of Pharmacological VTE agent (>24hrs) due to surgical blood loss or risk of bleeding: no

## 2023-03-16 NOTE — Anesthesia Preprocedure Evaluation (Signed)
Anesthesia Evaluation  Patient identified by MRN, date of birth, ID band Patient awake    Reviewed: Allergy & Precautions, NPO status , Patient's Chart, lab work & pertinent test results  Airway Mallampati: II  TM Distance: >3 FB Neck ROM: full    Dental  (+) Missing, Partial Upper, Partial Lower, Dental Advisory Given   Pulmonary neg pulmonary ROS, Patient abstained from smoking.   Pulmonary exam normal breath sounds clear to auscultation       Cardiovascular Exercise Tolerance: Good hypertension, Pt. on medications negative cardio ROS Normal cardiovascular exam Rhythm:Regular Rate:Normal     Neuro/Psych  PSYCHIATRIC DISORDERS   Bipolar Disorder    Neuromuscular disease negative neurological ROS  negative psych ROS   GI/Hepatic negative GI ROS, Neg liver ROS,,,  Endo/Other  negative endocrine ROSdiabetes, Type 2, Oral Hypoglycemic Agents    Renal/GU   negative genitourinary   Musculoskeletal   Abdominal Normal abdominal exam  (+)   Peds negative pediatric ROS (+)  Hematology negative hematology ROS (+)   Anesthesia Other Findings Past Medical History: No date: Bipolar disorder (HCC) No date: Cervical myelopathy (HCC) No date: Diabetes mellitus without complication (HCC) No date: Erectile dysfunction No date: Gait abnormality No date: Hyperlipidemia No date: Hypertension No date: Neuromuscular disorder (HCC) No date: Paresthesia No date: Peripheral neuropathy No date: Right inguinal hernia  Past Surgical History: 05/29/1977: ANKLE SURGERY 05/29/2009: APPENDECTOMY No date: CERVICAL SPINE SURGERY [phone]: HERNIA REPAIR 10/2018: SPINE SURGERY No date: VASECTOMY  BMI    Body Mass Index: 24.47 kg/m      Reproductive/Obstetrics negative OB ROS                             Anesthesia Physical Anesthesia Plan  ASA: 3  Anesthesia Plan: General   Post-op Pain Management:     Induction: Intravenous  PONV Risk Score and Plan: Ondansetron, Dexamethasone, Midazolam and Treatment may vary due to age or medical condition  Airway Management Planned: Oral ETT  Additional Equipment:   Intra-op Plan:   Post-operative Plan: Extubation in OR  Informed Consent: I have reviewed the patients History and Physical, chart, labs and discussed the procedure including the risks, benefits and alternatives for the proposed anesthesia with the patient or authorized representative who has indicated his/her understanding and acceptance.     Dental Advisory Given  Plan Discussed with: CRNA and Surgeon  Anesthesia Plan Comments:        Anesthesia Quick Evaluation

## 2023-03-16 NOTE — Transfer of Care (Signed)
Immediate Anesthesia Transfer of Care Note  Patient: Derek Garza  Procedure(s) Performed: HERNIA REPAIR INGUINAL ADULT, open (Right) INSERTION OF MESH  Patient Location: PACU  Anesthesia Type:General  Level of Consciousness: awake  Airway & Oxygen Therapy: Patient Spontanous Breathing and Patient connected to face mask oxygen  Post-op Assessment: Report given to RN and Post -op Vital signs reviewed and stable  Post vital signs: Reviewed and stable  Last Vitals:  Vitals Value Taken Time  BP 116/57 03/16/23 0917  Temp 36.2 C 03/16/23 0915  Pulse 82 03/16/23 0926  Resp 14 03/16/23 0926  SpO2 92 % 03/16/23 0926  Vitals shown include unfiled device data.  Last Pain:  Vitals:   03/16/23 0915  TempSrc:   PainSc: 0-No pain         Complications: No notable events documented.

## 2023-03-16 NOTE — Anesthesia Postprocedure Evaluation (Signed)
Anesthesia Post Note  Patient: Derek Garza  Procedure(s) Performed: HERNIA REPAIR INGUINAL ADULT, open (Right) INSERTION OF MESH  Patient location during evaluation: PACU Anesthesia Type: General Level of consciousness: awake and alert Pain management: pain level controlled Vital Signs Assessment: post-procedure vital signs reviewed and stable Respiratory status: spontaneous breathing and patient connected to face mask oxygen (Postop patient's sat again was in the upper 88-90%.) Cardiovascular status: stable Anesthetic complications: no   No notable events documented.   Last Vitals:  Vitals:   03/16/23 1023 03/16/23 1035  BP:  131/69  Pulse: 76 75  Resp: 11 17  Temp: 36.6 C 36.7 C  SpO2: (!) 88% (!) 85%    Last Pain:  Vitals:   03/16/23 1035  TempSrc: Temporal  PainSc: 4                  VAN STAVEREN,Areen Trautner

## 2023-03-17 ENCOUNTER — Emergency Department: Payer: Medicare HMO

## 2023-03-17 ENCOUNTER — Other Ambulatory Visit: Payer: Self-pay

## 2023-03-17 ENCOUNTER — Emergency Department
Admission: EM | Admit: 2023-03-17 | Discharge: 2023-03-17 | Disposition: A | Discharge status: Home / Self Care | Payer: Medicare HMO | Attending: Emergency Medicine | Admitting: Emergency Medicine

## 2023-03-17 DIAGNOSIS — N281 Cyst of kidney, acquired: Secondary | ICD-10-CM | POA: Diagnosis not present

## 2023-03-17 DIAGNOSIS — I10 Essential (primary) hypertension: Secondary | ICD-10-CM | POA: Diagnosis not present

## 2023-03-17 DIAGNOSIS — R1031 Right lower quadrant pain: Secondary | ICD-10-CM | POA: Diagnosis not present

## 2023-03-17 DIAGNOSIS — N433 Hydrocele, unspecified: Secondary | ICD-10-CM | POA: Diagnosis not present

## 2023-03-17 DIAGNOSIS — G8918 Other acute postprocedural pain: Secondary | ICD-10-CM | POA: Diagnosis not present

## 2023-03-17 DIAGNOSIS — E119 Type 2 diabetes mellitus without complications: Secondary | ICD-10-CM | POA: Diagnosis not present

## 2023-03-17 DIAGNOSIS — K4020 Bilateral inguinal hernia, without obstruction or gangrene, not specified as recurrent: Secondary | ICD-10-CM | POA: Diagnosis not present

## 2023-03-17 DIAGNOSIS — R1909 Other intra-abdominal and pelvic swelling, mass and lump: Secondary | ICD-10-CM | POA: Diagnosis not present

## 2023-03-17 LAB — COMPREHENSIVE METABOLIC PANEL
ALT: 12 U/L (ref 0–44)
AST: 18 U/L (ref 15–41)
Albumin: 3.6 g/dL (ref 3.5–5.0)
Alkaline Phosphatase: 82 U/L (ref 38–126)
Anion gap: 8 (ref 5–15)
BUN: 16 mg/dL (ref 8–23)
CO2: 24 mmol/L (ref 22–32)
Calcium: 8.5 mg/dL — ABNORMAL LOW (ref 8.9–10.3)
Chloride: 100 mmol/L (ref 98–111)
Creatinine, Ser: 0.78 mg/dL (ref 0.61–1.24)
GFR, Estimated: >60 mL/min (ref >60)
Glucose, Bld: 107 mg/dL — ABNORMAL HIGH (ref 70–99)
Potassium: 3.7 mmol/L (ref 3.5–5.1)
Sodium: 132 mmol/L — ABNORMAL LOW (ref 135–145)
Total Bilirubin: 1.7 mg/dL — ABNORMAL HIGH (ref 0.3–1.2)
Total Protein: 6.8 g/dL (ref 6.5–8.1)

## 2023-03-17 LAB — URINALYSIS, ROUTINE W REFLEX MICROSCOPIC
Bacteria, UA: NONE SEEN
Bilirubin Urine: NEGATIVE
Glucose, UA: >=500 mg/dL — AB
Ketones, ur: 20 mg/dL — AB
Leukocytes,Ua: NEGATIVE
Nitrite: NEGATIVE
Protein, ur: NEGATIVE mg/dL
Specific Gravity, Urine: 1.041 — ABNORMAL HIGH (ref 1.005–1.030)
Squamous Epithelial / HPF: 0 /[HPF] (ref 0–5)
pH: 5 (ref 5.0–8.0)

## 2023-03-17 LAB — CBC WITH DIFFERENTIAL/PLATELET
Abs Immature Granulocytes: 0.05 10*3/uL (ref 0.00–0.07)
Basophils Absolute: 0 10*3/uL (ref 0.0–0.1)
Basophils Relative: 0 %
Eosinophils Absolute: 0.1 10*3/uL (ref 0.0–0.5)
Eosinophils Relative: 1 %
HCT: 26.7 % — ABNORMAL LOW (ref 39.0–52.0)
Hemoglobin: 8.8 g/dL — ABNORMAL LOW (ref 13.0–17.0)
Immature Granulocytes: 1 %
Lymphocytes Relative: 17 %
Lymphs Abs: 1.7 10*3/uL (ref 0.7–4.0)
MCH: 34.6 pg — ABNORMAL HIGH (ref 26.0–34.0)
MCHC: 33 g/dL (ref 30.0–36.0)
MCV: 105.1 fL — ABNORMAL HIGH (ref 80.0–100.0)
Monocytes Absolute: 1.4 10*3/uL — ABNORMAL HIGH (ref 0.1–1.0)
Monocytes Relative: 14 %
Neutro Abs: 6.8 10*3/uL (ref 1.7–7.7)
Neutrophils Relative %: 67 %
Platelets: 234 10*3/uL (ref 150–400)
RBC: 2.54 MIL/uL — ABNORMAL LOW (ref 4.22–5.81)
RDW: 17.5 % — ABNORMAL HIGH (ref 11.5–15.5)
WBC: 10 10*3/uL (ref 4.0–10.5)
nRBC: 0 % (ref 0.0–0.2)

## 2023-03-17 LAB — LACTIC ACID, PLASMA: Lactic Acid, Venous: 0.9 mmol/L (ref 0.5–1.9)

## 2023-03-17 MED ORDER — ACETAMINOPHEN 500 MG PO TABS
1000.0000 mg | ORAL_TABLET | Freq: Once | ORAL | Status: AC
  Administered 2023-03-17: 1000 mg via ORAL
  Filled 2023-03-17: qty 2

## 2023-03-17 MED ORDER — MORPHINE SULFATE (PF) 4 MG/ML IV SOLN
4.0000 mg | Freq: Once | INTRAVENOUS | Status: AC
  Administered 2023-03-17: 4 mg via INTRAVENOUS
  Filled 2023-03-17: qty 1

## 2023-03-17 MED ORDER — IOHEXOL 300 MG/ML  SOLN
100.0000 mL | Freq: Once | INTRAMUSCULAR | Status: AC | PRN
  Administered 2023-03-17: 100 mL via INTRAVENOUS

## 2023-03-17 NOTE — ED Provider Notes (Signed)
----------------------------------------- 9:06 PM on 03/17/2023 -----------------------------------------  Blood pressure 125/76, pulse 90, temperature 97.9 F (36.6 C), resp. rate 18, height 6' (1.829 m), weight 83.9 kg, SpO2 92%.  Assuming care from Dr. Rosalia Hammers.  In short, Derek Garza is a 62 y.o. male with a chief complaint of Post-op Problem .  Refer to the original H&P for additional details.  The current plan of care is to wait for CT read and discussed results with surgery.  ____________________________________________    ED Results / Procedures / Treatments   Labs (all labs ordered are listed, but only abnormal results are displayed) Labs Reviewed  CBC WITH DIFFERENTIAL/PLATELET - Abnormal; Notable for the following components:      Result Value   RBC 2.54 (*)    Hemoglobin 8.8 (*)    HCT 26.7 (*)    MCV 105.1 (*)    MCH 34.6 (*)    RDW 17.5 (*)    Monocytes Absolute 1.4 (*)    All other components within normal limits  COMPREHENSIVE METABOLIC PANEL - Abnormal; Notable for the following components:   Sodium 132 (*)    Glucose, Bld 107 (*)    Calcium 8.5 (*)    Total Bilirubin 1.7 (*)    All other components within normal limits  URINALYSIS, ROUTINE W REFLEX MICROSCOPIC - Abnormal; Notable for the following components:   Color, Urine YELLOW (*)    APPearance CLEAR (*)    Specific Gravity, Urine 1.041 (*)    Glucose, UA >=500 (*)    Hgb urine dipstick SMALL (*)    Ketones, ur 20 (*)    All other components within normal limits  LACTIC ACID, PLASMA    RADIOLOGY  I personally viewed and evaluated these images as part of my medical decision making, as well as reviewing the written report by the radiologist.  ED Provider Interpretation: Asymmetry along patient's right groin with possible edema or inflammation.  CT ABDOMEN PELVIS W CONTRAST  Result Date: 03/17/2023 CLINICAL DATA:  Right groin swelling following recent hernia surgery, initial encounter EXAM: CT  ABDOMEN AND PELVIS WITH CONTRAST TECHNIQUE: Multidetector CT imaging of the abdomen and pelvis was performed using the standard protocol following bolus administration of intravenous contrast. RADIATION DOSE REDUCTION: This exam was performed according to the departmental dose-optimization program which includes automated exposure control, adjustment of the mA and/or kV according to patient size and/or use of iterative reconstruction technique. CONTRAST:  OMNIPAQUE IOHEXOL 300 MG/ML  SOLN COMPARISON:  06/22/2009 FINDINGS: Lower chest: No acute abnormality. Hepatobiliary: No focal liver abnormality is seen. Status post cholecystectomy. No biliary dilatation. Pancreas: Unremarkable. No pancreatic ductal dilatation or surrounding inflammatory changes. Spleen: Normal in size without focal abnormality. Adrenals/Urinary Tract: Adrenal glands are within normal limits. Kidneys demonstrate a normal enhancement pattern bilaterally. No calculi are noted. Small left renal cyst is seen. No follow-up is recommended. No obstructive changes are noted. The bladder is within normal limits. Stomach/Bowel: No obstructive or inflammatory changes of colon are noted. The appendix has been surgically removed. Small bowel and stomach are unremarkable. Vascular/Lymphatic: Aortic atherosclerosis. No enlarged abdominal or pelvic lymph nodes. Reproductive: Prostate is unremarkable. Other: There are changes in the right inguinal region consistent with the given clinical history of hernia repair. There remains however a significant amount of herniated fat in the right inguinal canal extending into the scrotum on the right. Small fat containing left inguinal hernia is noted as well. The swelling is partially related to herniated fat as well as  inflammatory change from the recent surgery. Musculoskeletal: No acute or significant osseous findings. IMPRESSION: Postsurgical edema is noted in the right inguinal region. There is however persistent  fat in the right inguinal canal extending into the scrotum on the right. No other focal abnormality is noted. Electronically Signed   By: Alcide Clever M.D.   On: 03/17/2023 20:53   US SCROTUM W/DOPPLER  Result Date: 03/17/2023 CLINICAL DATA:  Swelling and pain in the right hemiscrotum and groin. Right inguinal hernia repair yesterday. EXAM: SCROTAL ULTRASOUND DOPPLER ULTRASOUND OF THE TESTICLES TECHNIQUE: Complete ultrasound examination of the testicles, epididymis, and other scrotal structures was performed. Color and spectral Doppler ultrasound were also utilized to evaluate blood flow to the testicles. COMPARISON:  None Available. FINDINGS: Right testicle Measurements: 5.4 x 2.8 x 3.4 cm. 2 benign cysts are noted along the posterior and lateral aspects of the right hemiscrotum measuring 10 x 6 x 7 mm and 7 x 5 x 5 mm respectively. No mass or microlithiasis is present within the testicle. Left testicle Measurements: 3.7 x 0.6 x 1.9 cm. Testicle is somewhat heterogeneous. Right epididymis:  Normal in size and appearance. Left epididymis: Not well visualized. Epididymal cysts measure 5 and 6 mm respectively. Hydrocele:  Small right hydrocele is present. Varicocele:  Bilateral varicoceles are present. No significant inguinal hernia is imaged. Pulsed Doppler interrogation of both testes demonstrates normal low resistance arterial and venous waveforms bilaterally. IMPRESSION: 1. No evidence of testicular torsion. 2. Asymmetric smaller and heterogeneous left testicle. Question prior injury. 3. Bilateral varicoceles. 4. Small right hydrocele. 5. Benign cysts are noted along the posterior and lateral aspects of the right hemiscrotum. Electronically Signed   By: Marin Roberts M.D.   On: 03/17/2023 16:14     PROCEDURES:  Critical Care performed: No  Procedures   MEDICATIONS ORDERED IN ED: Medications  morphine (PF) 4 MG/ML injection 4 mg (4 mg Intravenous Given 03/17/23 1654)  iohexol (OMNIPAQUE)  300 MG/ML solution 100 mL (100 mLs Intravenous Contrast Given 03/17/23 1803)  acetaminophen (TYLENOL) tablet 1,000 mg (1,000 mg Oral Given 03/17/23 1926)     IMPRESSION / MDM / ASSESSMENT AND PLAN / ED COURSE  I reviewed the triage vital signs and the nursing notes.                              Differential diagnosis includes, but is not limited to, postoperative swelling, postoperative pain, herniation.  Patient's presentation is most consistent with acute complicated illness / injury requiring diagnostic workup.  I spoke with the on-call general surgeon, Dr. Maia Plan, who personally reviewed the patient's CT results.  He does not see anything overly concerning but notes he does have lots of edema.  Dr. Maia Plan advised on pain management.  Patient will need to continue taking his oxycodone, he can take this every 4 hours.  I also advised him to take ibuprofen and Tylenol in conjunction with this.  You should also ice which will improve swelling and pain.  He is to reach out to his general surgeons office Monday morning if he still having pain.  Patient's diagnosis is consistent with post operative pain.  Patient is to follow up with general surgery as needed or otherwise directed. Patient is given ED precautions to return to the ED for any worsening or new symptoms.     FINAL CLINICAL IMPRESSION(S) / ED DIAGNOSES   Final diagnoses:  Post-operative pain  Right inguinal pain  Rx / DC Orders   ED Discharge Orders     None        Note:  This document was prepared using Dragon voice recognition software and may include unintentional dictation errors.    Cameron Ali, PA-C 03/17/23 2130    Chesley Noon, MD 03/17/23 [phone]

## 2023-03-17 NOTE — ED Notes (Signed)
IV site wrapped with cobain per patient's request due to allergy to tegaderm.

## 2023-03-17 NOTE — ED Notes (Signed)
Patient ambulated with a steady gait to hallway bathroom. Patient declined any socks.  Friend at bedside.

## 2023-03-17 NOTE — ED Triage Notes (Signed)
Pt to ED for swelling to right groin after having hernia surgery on Friday. Noticed swelling today.

## 2023-03-17 NOTE — ED Notes (Signed)
Dr. Rosalia Hammers aware of patient's pulse ox of 92% on room air.

## 2023-03-17 NOTE — ED Provider Notes (Signed)
[facility] Provider Note    Event Date/Time   First MD Initiated Contact with Patient 03/17/23 1502     (approximate)   History   Post-op Problem   HPI  Karsen Reasons is a 62 year old male with history of T2DM, HTN presenting to the emergency department for evaluation of groin pain.  Patient underwent right inguinal hernia repair surgery yesterday.  He reports that he had pain ever since he was discharged, but this morning noticed that he had recurrent swelling in his right groin as well as ongoing abdominal pain.  Was discharged with oxycodone which she reports he has taken with limited improvement in his pain.  Denies nausea or vomiting.     Physical Exam   Triage Vital Signs: ED Triage Vitals  Encounter Vitals Group     BP 03/17/23 1339 134/75     Systolic BP Percentile --      Diastolic BP Percentile --      Pulse Rate 03/17/23 1339 95     Resp 03/17/23 1339 18     Temp 03/17/23 1338 98.1 F (36.7 C)     Temp src --      SpO2 03/17/23 1339 93 %     Weight 03/17/23 1339 185 lb (83.9 kg)     Height 03/17/23 1339 6' (1.829 m)     Head Circumference --      Peak Flow --      Pain Score 03/17/23 1339 9     Pain Loc --      Pain Education --      Exclude from Growth Chart --     Most recent vital signs: Vitals:   03/17/23 1644 03/17/23 2020  BP: (!) 148/76 125/76  Pulse: 95 90  Resp: 16 18  Temp:  97.9 F (36.6 C)  SpO2: 92% 92%     General: Awake, interactive  CV:  Regular rate, good peripheral perfusion.  Resp:  Lungs clear, unlabored respirations.  Abd:  Soft, nondistended, tender to palpation diffusely most notably over the right side of the abdomen.  Prominent bulge in the right inguinal area not readily reducible, but limited attempts due to patient pain and recent surgery Neuro:  Symmetric facial movement, fluid speech   ED Results / Procedures / Treatments   Labs (all labs ordered are listed, but only abnormal results  are displayed) Labs Reviewed  CBC WITH DIFFERENTIAL/PLATELET - Abnormal; Notable for the following components:      Result Value   RBC 2.54 (*)    Hemoglobin 8.8 (*)    HCT 26.7 (*)    MCV 105.1 (*)    MCH 34.6 (*)    RDW 17.5 (*)    Monocytes Absolute 1.4 (*)    All other components within normal limits  COMPREHENSIVE METABOLIC PANEL - Abnormal; Notable for the following components:   Sodium 132 (*)    Glucose, Bld 107 (*)    Calcium 8.5 (*)    Total Bilirubin 1.7 (*)    All other components within normal limits  URINALYSIS, ROUTINE W REFLEX MICROSCOPIC - Abnormal; Notable for the following components:   Color, Urine YELLOW (*)    APPearance CLEAR (*)    Specific Gravity, Urine 1.041 (*)    Glucose, UA >=500 (*)    Hgb urine dipstick SMALL (*)    Ketones, ur 20 (*)    All other components within normal limits  LACTIC ACID, PLASMA     EKG  EKG independently reviewed interpreted by myself (ER attending) demonstrates:    RADIOLOGY Imaging independently reviewed and interpreted by myself demonstrates:  Scrotal ultrasound ordered from triage without evidence of torsion CT abdomen pelvis demonstrates asymmetry alongside the patient's hernia repair, formal radiology read pending  PROCEDURES:  Critical Care performed: No  Procedures   MEDICATIONS ORDERED IN ED: Medications  morphine (PF) 4 MG/ML injection 4 mg (4 mg Intravenous Given 03/17/23 1654)  iohexol (OMNIPAQUE) 300 MG/ML solution 100 mL (100 mLs Intravenous Contrast Given 03/17/23 1803)  acetaminophen (TYLENOL) tablet 1,000 mg (1,000 mg Oral Given 03/17/23 1926)     IMPRESSION / MDM / ASSESSMENT AND PLAN / ED COURSE  I reviewed the triage vital signs and the nursing notes.  Differential diagnosis includes, but is not limited to, recurrent hernia, postoperative complication, postoperative hematoma, other acute intra-abdominal process  Patient's presentation is most consistent with acute presentation with  potential threat to life or bodily function.  62 year old male presenting with right groin pain after surgery yesterday.  Significant swelling and tenderness on exam.  Ordered for morphine for pain control, labs, CT abdomen pelvis ordered.    Labs with normal white blood cell count, but does have acute hemoglobin drop with hemoglobin of 8.8, was 11.6 several days preoperatively.  Fortunately normal lactate.  Urine without evidence of infection.  CT read pending.  Given the send surgery.  Anticipate will likely need discussion with surgery service following radiology read.  Signed out to oncoming provider pending CT, discussion with consultants as appropriate and disposition.   FINAL CLINICAL IMPRESSION(S) / ED DIAGNOSES   Final diagnoses:  Post-operative pain  Right inguinal pain     Rx / DC Orders   ED Discharge Orders     None        Note:  This document was prepared using Dragon voice recognition software and may include unintentional dictation errors.   Trinna Post, MD 03/17/23 2039

## 2023-03-17 NOTE — ED Notes (Signed)
Patient was given an urinal for specimen collection. Patient states he would prefer that over the specimen container.

## 2023-03-17 NOTE — Discharge Instructions (Signed)
You can take the oxycodone every 4-6 hours as needed for severe pain. You can take 650 mg of Tylenol and 600 mg of ibuprofen every 6 hours as needed for mild to moderate pain.  Please follow-up with your general surgeon Monday morning if you are still having pain.

## 2023-03-18 NOTE — Plan of Care (Signed)
[facility] Tonsillectomy/Adenoidectomy, Postoperative PEDS care plan entered in error.

## 2023-03-19 ENCOUNTER — Telehealth: Payer: Self-pay | Admitting: General Surgery

## 2023-03-19 NOTE — Telephone Encounter (Signed)
Patient seen in the ED over the weekend.  He reports swelling in his right groin with increased pain.  He had a CT scan that showed some edema as well as fat within the inguinal canal.  He also had an ultrasound did not show any evidence of testicular ischemia and no obvious hernia recurrence.  I called him today and he reports doing better.  He has increased his oxycodone to every 4 hours as needed and is taking ibuprofen and Tylenol around-the-clock.  He says that this pain is better and the swelling is still there.  He reports swelling in his testicle.  I discussed with him that he had a cord lipoma which is what seen on the CT scan.  I also discussed that edema is to be expected after surgery.  He should elevate his testicle.  I will also have him come see me in the office this Thursday to check him.

## 2023-03-19 NOTE — Op Note (Signed)
  03/16/2023   9:15 AM   PATIENT:  Derek Garza  62 y.o. male   PRE-OPERATIVE DIAGNOSIS:  right inguinal hernia   POST-OPERATIVE DIAGNOSIS:  right inguinal hernia   PROCEDURE:  Procedure(s): HERNIA REPAIR INGUINAL ADULT, open (Right) INSERTION OF MESH   SURGEON:  Surgeons and Role:    * Kandis Cocking, MD - Primary   PHYSICIAN ASSISTANT:    ASSISTANTS: none    ANESTHESIA:   regional   EBL:  5 mL    BLOOD ADMINISTERED:none   DRAINS: none    LOCAL MEDICATIONS USED:  MARCAINE    and BUPIVICAINE    SPECIMEN:  No Specimen   DISPOSITION OF SPECIMEN:  N/A   COUNTS:  YES   TOURNIQUET:  * No tourniquets in log *   DICTATION: .Dragon Dictation   PLAN OF CARE: Discharge to home after PACU   PATIENT DISPOSITION:  PACU - hemodynamically stable.    After informed consent was obtained the patient was brought to the operative room placed supine on the operating table.  General endotracheal anesthesia was induced and his right groin was then prepped and draped in the usual sterile fashion.  A standard groin incision was made and this was carried through the subcutaneous tissue with electrocautery.  The external oblique was identified and cleared off of overlying tissue.  This was opened with a scalpel along the length of its fibers.  This was extended towards the superficial ring.  The cord and hernia sac were then dissected out and circled with a Penrose drain.  Cremaster fibers were taken down and the spermatic cord contents were separated from the hernia sac up to the level of the deep ring.  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  I did resect a portion of the cord lipoma.  The patient had a indirect inguinal hernia.  Once the hernia sac was completely cleared off it was placed back into the peritoneum.  The pubic tubercle was cleared of overlying tissue and the shelving edge was cleared off.  A keyhole mesh was selected for repair.  This was sewn to the pubic tubercle  medially and ran along the inguinal ligament with a 2-0 Prolene up to the level of the deep ring.  The superior edge of the mesh was also secured to the pubic tubercle and to the conjoined tendon using a series of interrupted 2-0 Prolene sutures.  The tails of the mesh were encircled around spermatic cord and sewn together with a 2-0 Prolene ensuring that the spermatic cord was not constricted.  The tails were then laid flat underneath the external oblique to recreate the deep ring.  Hemostasis was ensured and the operative field was irrigated with saline solution.. The external oblique was then closed with a 3-0 Vicryl.  The subcutaneous tissue was infiltrated with Marcaine and liposomal bupivacaine solution.  Subcutaneous tissue was closed with 3-0 Vicryl using deep dermal suture.  And the skin was closed with 4-0 Monocryl and dressed with glue.  Prior to termination of the procedure all sponge and instrument counts were correct x 2.

## 2023-03-22 ENCOUNTER — Encounter: Payer: Medicare HMO | Admitting: General Surgery

## 2023-03-22 ENCOUNTER — Emergency Department
Admission: EM | Admit: 2023-03-22 | Discharge: 2023-03-22 | Disposition: A | Discharge status: Home / Self Care | Payer: Medicare HMO | Attending: Emergency Medicine | Admitting: Emergency Medicine

## 2023-03-22 ENCOUNTER — Emergency Department: Payer: Medicare HMO

## 2023-03-22 ENCOUNTER — Encounter: Payer: Self-pay | Admitting: Urgent Care

## 2023-03-22 ENCOUNTER — Other Ambulatory Visit: Payer: Self-pay

## 2023-03-22 DIAGNOSIS — S0181XA Laceration without foreign body of other part of head, initial encounter: Secondary | ICD-10-CM | POA: Insufficient documentation

## 2023-03-22 DIAGNOSIS — W1839XA Other fall on same level, initial encounter: Secondary | ICD-10-CM | POA: Insufficient documentation

## 2023-03-22 DIAGNOSIS — M4802 Spinal stenosis, cervical region: Secondary | ICD-10-CM | POA: Diagnosis not present

## 2023-03-22 DIAGNOSIS — S0510XA Contusion of eyeball and orbital tissues, unspecified eye, initial encounter: Secondary | ICD-10-CM | POA: Diagnosis not present

## 2023-03-22 DIAGNOSIS — S199XXA Unspecified injury of neck, initial encounter: Secondary | ICD-10-CM | POA: Diagnosis not present

## 2023-03-22 DIAGNOSIS — Z981 Arthrodesis status: Secondary | ICD-10-CM | POA: Diagnosis not present

## 2023-03-22 DIAGNOSIS — M50323 Other cervical disc degeneration at C6-C7 level: Secondary | ICD-10-CM | POA: Diagnosis not present

## 2023-03-22 DIAGNOSIS — S161XXA Strain of muscle, fascia and tendon at neck level, initial encounter: Secondary | ICD-10-CM | POA: Diagnosis not present

## 2023-03-22 DIAGNOSIS — S0993XA Unspecified injury of face, initial encounter: Secondary | ICD-10-CM | POA: Diagnosis present

## 2023-03-22 DIAGNOSIS — S0121XA Laceration without foreign body of nose, initial encounter: Secondary | ICD-10-CM | POA: Diagnosis not present

## 2023-03-22 DIAGNOSIS — S022XXA Fracture of nasal bones, initial encounter for closed fracture: Secondary | ICD-10-CM | POA: Insufficient documentation

## 2023-03-22 DIAGNOSIS — W19XXXA Unspecified fall, initial encounter: Secondary | ICD-10-CM

## 2023-03-22 MED ORDER — ONDANSETRON 4 MG PO TBDP
4.0000 mg | ORAL_TABLET | Freq: Once | ORAL | Status: AC
  Administered 2023-03-22: 4 mg via ORAL
  Filled 2023-03-22: qty 1

## 2023-03-22 MED ORDER — OXYCODONE HCL 5 MG PO TABS
5.0000 mg | ORAL_TABLET | Freq: Three times a day (TID) | ORAL | 0 refills | Status: AC | PRN
Start: 2023-03-22 — End: 2023-03-25

## 2023-03-22 MED ORDER — OXYCODONE-ACETAMINOPHEN 5-325 MG PO TABS
1.0000 | ORAL_TABLET | Freq: Once | ORAL | Status: AC
  Administered 2023-03-22: 1 via ORAL
  Filled 2023-03-22: qty 1

## 2023-03-22 NOTE — Progress Notes (Addendum)
  Perioperative Services Pre-Admission/Anesthesia Testing    Date: 03/22/23  Name: Derek Garza MRN:   [id number]  Re: Mechanical fall  Patient POD-6 following unilateral inguinal hernia repair performed on 03/16/2023.  Patient presenting to campus for postoperative follow-up with Dr. Baker Pierini, MD.  [facility] Dr. Kirkland Hun office for follow-up; query mistake vs. confusion.  Upon entering the [facility], patient sustained a medical fall whereby he fell to the ground striking his face/head on the pavement.  Rapid response team was called for assistance.  Upon my arrival, patient seated in wheelchair next to fairly large pleural blood on the pavement.  Patient conscious and alert; answering questions and following commands appropriately; A&O x 4. No LOC. Patient with facial trauma noted to his forehead and bridge of his nose; open lacerations noted.  Patient with significant bleeding from his nose.  Patient is not on oral anticoagulation or antiplatelet therapies.  Patient able to tell me that he was being seen today by Dr. Maurine Minister for follow-up after recent hernia repair.  Bystanders report that patient struck his head "very hard" on the ground.  Given distracting injuries, patient being taken to the ED for further evaluation and potential imaging as deemed necessary by emergency care providers. Call placed to surgeon's office to make them aware of event and plans for further evaluation in the ED.  Quentin Mulling, MSN, APRN, FNP-C, CEN [facility]  Perioperative Services Nurse Practitioner Phone: [phone] Fax: [phone] 03/22/23 11:14 AM  NOTE: This note has been prepared using Dragon dictation software. Despite my best ability to proofread, there is always the potential that unintentional transcriptional errors may still occur from this process.

## 2023-03-22 NOTE — ED Triage Notes (Signed)
Pt presents to ED with c/o of [place]. Pt states he did not LOC and states "his knees buckled". NAD noted. Pt denies blood thinners.

## 2023-03-22 NOTE — Discharge Instructions (Addendum)
You are seen in the emergency department following a fall with head injury.  Your CT scan of your head did not show any signs of internal bleeding.  Your CT scan of your face showed a broken bone in your nose.  Your CT scan of your neck did not show any new emergency changes that would require surgery at this time, consistent with your old injuries and surgeries.  You are given information to follow-up as an outpatient with the ear nose and throat specialist.  No nose blowing.  You can apply ice for swelling.  You were given a prescription for narcotic pain medications.  Take only if in severe pain.  These are very addictive medications.  These medications can make you constipated.  If you need to take more than 1-2 doses, start a stool softner.  If you become constipated, take 1 capfull of MiraLAX, can repeat untill having regular bowel movements.  Keep this medication out of reach of any children.  Thank you for choosing [place] for your health care, it was my pleasure to care for you today!  Corena Herter, MD

## 2023-03-22 NOTE — ED Notes (Signed)
See triage note  Presents s/p fal;l  States his knees buckled and he fell

## 2023-03-22 NOTE — ED Provider Notes (Signed)
CT C-spine reviewed no emergency findings, plan will be for discharge   Pilar Jarvis, MD 03/22/23 1620

## 2023-03-22 NOTE — ED Provider Notes (Addendum)
[facility] Provider Note    Event Date/Time   First MD Initiated Contact with Patient 03/22/23 1341     (approximate)   History   Fall   HPI  Derek Garza is a 62 y.o. male presents to the emergency department following a fall.  Patient had a history of cervical spine repair.  Also has a recent hernia repair with general surgery and states that he was walking into have his follow-up appointment.  Had a fall and felt like his knees buckled and gave out from underneath him.  Endorse head injury but no loss of consciousness.  No chest pain or shortness of breath.  Complaining of pain to his face.  No change in vision.  Denies any trouble swallowing.  Complaining of pain to his neck.  Denies any extremity numbness or weakness.  No new abdominal pain.  Not on anticoagulation.     Physical Exam   Triage Vital Signs: ED Triage Vitals  Encounter Vitals Group     BP 03/22/23 1210 126/72     Systolic BP Percentile --      Diastolic BP Percentile --      Pulse Rate 03/22/23 1210 80     Resp 03/22/23 1210 18     Temp 03/22/23 1210 98.8 F (37.1 C)     Temp Source 03/22/23 1210 Oral     SpO2 03/22/23 1210 96 %     Weight 03/22/23 1211 185 lb (83.9 kg)     Height 03/22/23 1211 6' (1.829 m)     Head Circumference --      Peak Flow --      Pain Score 03/22/23 1210 6     Pain Loc --      Pain Education --      Exclude from Growth Chart --     Most recent vital signs: Vitals:   03/22/23 1210  BP: 126/72  Pulse: 80  Resp: 18  Temp: 98.8 F (37.1 C)  SpO2: 96%    Physical Exam Constitutional:      Appearance: He is well-developed.  HENT:     Head:     Comments: Abrasion to the forehead and nasal bridge.  No septal hematoma. Eyes:     Extraocular Movements: Extraocular movements intact.     Conjunctiva/sclera: Conjunctivae normal.     Pupils: Pupils are equal, round, and reactive to light.  Neck:     Comments: Mild paraspinal muscle tenderness  to palpation. Cardiovascular:     Rate and Rhythm: Regular rhythm.  Pulmonary:     Effort: No respiratory distress.  Abdominal:     Tenderness: There is no abdominal tenderness.     Comments: Well-healing incision.  No significant abdominal tenderness to palpation  Musculoskeletal:     Cervical back: Normal range of motion. No tenderness.     Comments: No midline thoracic or lumbar tenderness to palpation  Skin:    General: Skin is warm.     Capillary Refill: Capillary refill takes less than 2 seconds.  Neurological:     Mental Status: He is alert. Mental status is at baseline.  Psychiatric:        Mood and Affect: Mood normal.      IMPRESSION / MDM / ASSESSMENT AND PLAN / ED COURSE  I reviewed the triage vital signs and the nursing notes.  Differential diagnosis including intracranial hemorrhage, concussion, facial fracture, cervical spine fracture, cervical strain   RADIOLOGY I independently reviewed  imaging, my interpretation of imaging: CT scan of the head without signs of intracranial hemorrhage.  CT head read as no acute findings  CT scan of the face mildly depressed nasal bone fracture.  Trace orbital hematoma.  CT scan of the cervical spine pending   Labs (all labs ordered are listed, but only abnormal results are displayed) Labs interpreted as -    Labs Reviewed - No data to display    Given p.o. Percocet.  No signs of a septal hematoma.  Lacerations were repaired at bedside.  Tetanus is up-to-date.  Will do a short course of pain medication.  Discussed follow-up with ENT for nasal bone fracture.  Discussed no nose blowing and symptomatic treatment.  Discussed return precautions to the emergency department.   PROCEDURES:  Critical Care performed: No  ..Laceration Repair  Date/Time: 03/22/2023 3:23 PM  Performed by: Corena Herter, MD Authorized by: Corena Herter, MD   Consent:    Consent obtained:  Verbal   Consent given by:  Patient   Risks,  benefits, and alternatives were discussed: yes     Risks discussed:  Pain, nerve damage, poor wound healing, poor cosmetic result, vascular damage, tendon damage, infection and need for additional repair   Alternatives discussed:  Delayed treatment and no treatment Universal protocol:    Procedure explained and questions answered to patient or proxy's satisfaction: yes     Relevant documents present and verified: yes     Test results available: yes     Imaging studies available: yes     Required blood products, implants, devices, and special equipment available: yes     Patient identity confirmed:  Verbally with patient Anesthesia:    Anesthesia method:  None Laceration details:    Location:  Face   Face location:  Forehead   Length (cm):  1 Pre-procedure details:    Preparation:  Patient was prepped and draped in usual sterile fashion Treatment:    Amount of cleaning:  Standard Skin repair:    Repair method:  Tissue adhesive Approximation:    Approximation:  Close Repair type:    Repair type:  Simple Post-procedure details:    Dressing:  Sterile dressing   Procedure completion:  Tolerated well, no immediate complications   Patient's presentation is most consistent with acute presentation with potential threat to life or bodily function.   MEDICATIONS ORDERED IN ED: Medications  oxyCODONE-acetaminophen (PERCOCET/ROXICET) 5-325 MG per tablet 1 tablet (1 tablet Oral Given 03/22/23 1400)  ondansetron (ZOFRAN-ODT) disintegrating tablet 4 mg (4 mg Oral Given 03/22/23 1400)    FINAL CLINICAL IMPRESSION(S) / ED DIAGNOSES   Final diagnoses:  Fall, initial encounter  Laceration of forehead, initial encounter  Closed fracture of nasal bone, initial encounter  Strain of neck muscle, initial encounter     Rx / DC Orders   ED Discharge Orders          Ordered    oxyCODONE (ROXICODONE) 5 MG immediate release tablet  Every 8 hours PRN        03/22/23 1356              Note:  This document was prepared using Dragon voice recognition software and may include unintentional dictation errors.   Corena Herter, MD 03/22/23 1524    Corena Herter, MD 03/22/23 1530

## 2023-03-22 NOTE — ED Triage Notes (Signed)
[facility], had mechanical fall getting out of vehicle, lac to center of head. Denies LOC, denies blood thinners. A/O x 4. Pt was going to postop follow up from umbilical hernia surgery.

## 2023-03-26 ENCOUNTER — Telehealth: Payer: Self-pay

## 2023-03-26 NOTE — Telephone Encounter (Signed)
Patient called in to reschedule his colonoscopy.

## 2023-03-26 NOTE — Telephone Encounter (Signed)
Spoken to patient and we are rescheduling his colonoscopy due to work/won't be here on that day.  Requesting to reschedule to 05/02/2023.  New instructions will be sent. Patient already pick up prep solution.

## 2023-03-27 ENCOUNTER — Ambulatory Visit: Payer: Medicare HMO | Admitting: Family Medicine

## 2023-03-28 ENCOUNTER — Encounter: Payer: Managed Care, Other (non HMO) | Admitting: Physician Assistant

## 2023-04-15 NOTE — Progress Notes (Unsigned)
   Synopsis: Referred in by Marjie Skiff, NP   Subjective:   PATIENT ID: Derek Garza GENDER: male DOB: 15-Jun-1960, MRN: [id number]  No chief complaint on file.   HPI Derek Garza is a 62 year old male patient with a past medical history of DM II, HTN and HLD presenting to the pulmonary clinic as a referral from his PCP for the evaluation of hypoxia noted at the preop evaluation for right inguinal hernia repair 03/16/2023.     Family history:  Social history:    ROS All systems were reviewed and are negative except for the above.  Objective:  There were no vitals filed for this visit.   on *** LPM *** RA BMI Readings from Last 3 Encounters:  03/22/23 25.09 kg/m  03/17/23 25.09 kg/m  03/16/23 24.47 kg/m   Wt Readings from Last 3 Encounters:  03/22/23 185 lb (83.9 kg)  03/17/23 185 lb (83.9 kg)  03/16/23 180 lb 6.4 oz (81.8 kg)    Physical Exam GEN: NAD, Healthy Appearing HEENT: Supple Neck, Reactive Pupils, EOMI  CVS: Normal S1, Normal S2, RRR, No murmurs or ES appreciated  Lungs: Clear bilateral air entry.  Abdomen: Soft, non tender, non distended, + BS  Extremities: Warm and well perfused, No edema  Skin: No suspicious lesions appreciated  Psych: Normal Affect  Ancillary Information   CBC    Component Value Date/Time   WBC 10.0 03/17/2023 1643   RBC 2.54 (L) 03/17/2023 1643   HGB 8.8 (L) 03/17/2023 1643   HGB 13.4 04/25/2022 0829   HCT 26.7 (L) 03/17/2023 1643   HCT 41.4 04/25/2022 0829   PLT 234 03/17/2023 1643   PLT 360 04/25/2022 0829   MCV 105.1 (H) 03/17/2023 1643   MCV 100 (H) 04/25/2022 0829   MCV 93 06/09/2012 2124   MCH 34.6 (H) 03/17/2023 1643   MCHC 33.0 03/17/2023 1643   RDW 17.5 (H) 03/17/2023 1643   RDW 12.2 04/25/2022 0829   RDW 13.1 06/09/2012 2124   LYMPHSABS 1.7 03/17/2023 1643   LYMPHSABS 1.9 04/25/2022 0829   LYMPHSABS 1.6 06/09/2012 2124   MONOABS 1.4 (H) 03/17/2023 1643   MONOABS 0.8 06/09/2012 2124   EOSABS 0.1  03/17/2023 1643   EOSABS 0.1 04/25/2022 0829   EOSABS 0.1 06/09/2012 2124   BASOSABS 0.0 03/17/2023 1643   BASOSABS 0.1 04/25/2022 0829   BASOSABS 0.1 06/09/2012 2124   No imaging to review.       No data to display           Assessment & Plan:  Derek Garza is a 62 year old male patient with a past medical history of DM II, HTN and HLD presenting to the pulmonary clinic as a referral from his PCP for the evaluation of hypoxia noted at the preop evaluation for right inguinal hernia repair 03/16/2023.   No follow-ups on file.  I spent *** minutes caring for this patient today, including {EM billing:28027}  Janann Colonel, MD [facility] 04/15/2023 5:28 PM

## 2023-04-16 ENCOUNTER — Ambulatory Visit
Admission: RE | Admit: 2023-04-16 | Discharge: 2023-04-16 | Disposition: A | Discharge status: Home / Self Care | Payer: Medicare HMO | Source: Ambulatory Visit | Attending: Pulmonary Disease | Admitting: Pulmonary Disease

## 2023-04-16 ENCOUNTER — Ambulatory Visit: Payer: Medicare HMO | Admitting: Pulmonary Disease

## 2023-04-16 ENCOUNTER — Encounter: Payer: Self-pay | Admitting: Pulmonary Disease

## 2023-04-16 VITALS — BP 132/78 | HR 87 | Temp 97.6°F | Ht 72.0 in | Wt 189.6 lb

## 2023-04-16 DIAGNOSIS — R0902 Hypoxemia: Secondary | ICD-10-CM | POA: Diagnosis not present

## 2023-04-16 DIAGNOSIS — J9611 Chronic respiratory failure with hypoxia: Secondary | ICD-10-CM | POA: Insufficient documentation

## 2023-04-16 DIAGNOSIS — I700 Atherosclerosis of aorta: Secondary | ICD-10-CM | POA: Diagnosis not present

## 2023-04-16 NOTE — Progress Notes (Signed)
Synopsis: Referred in by Marjie Skiff, NP   Subjective:   PATIENT ID: Derek Garza GENDER: male DOB: [birth year], MRN: [id number]  Chief Complaint  Patient presents with   Consult    Low oxygen levels at ED visit on 03/22/2023. Patient reports his fingers have always been cold and that make the reading low. No cough, shortness of breath or wheezing.     HPI Derek Garza is a 62 year old male patient with a past medical history of DM II, HTN and HLD presenting to the pulmonary clinic as a referral from his PCP for the evaluation of hypoxia noted at the preop evaluation for right inguinal hernia repair 03/16/2023.   He reports feeling well overall. Has no major complaints. Denies any shortness of breath, sleeping issues, wakes up refreshed in the morning and denies any apneic episodes. Reports his oxygen is on the lower side all his life around 93 to 95%.   Family history: Asthma in his mom  Social history: Smokes cigarettes only occasionally, rarely drinks alcohol. Has 1 dog at home.   ROS All systems were reviewed and are negative except for the above.  Objective:   Vitals:   04/16/23 0844  BP: 132/78  Pulse: 87  Temp: 97.6 F (36.4 C)  TempSrc: Temporal  SpO2: 95%  Weight: 189 lb 9.6 oz (86 kg)  Height: 6' (1.829 m)   95% on RA BMI Readings from Last 3 Encounters:  04/16/23 25.71 kg/m  03/22/23 25.09 kg/m  03/17/23 25.09 kg/m   Wt Readings from Last 3 Encounters:  04/16/23 189 lb 9.6 oz (86 kg)  03/22/23 185 lb (83.9 kg)  03/17/23 185 lb (83.9 kg)    Physical Exam GEN: NAD, Healthy Appearing HEENT: Supple Neck, Reactive Pupils, EOMI  CVS: Normal S1, Normal S2, RRR, No murmurs or ES appreciated  Lungs: Clear bilateral air entry.  Abdomen: Soft, non tender, non distended, + BS  Extremities: Warm and well perfused, No edema  Skin: No suspicious lesions appreciated  Psych: Normal Affect  Labs and imaging reviewed.  Ancillary Information   CBC     Component Value Date/Time   WBC 10.0 03/17/2023 1643   RBC 2.54 (L) 03/17/2023 1643   HGB 8.8 (L) 03/17/2023 1643   HGB 13.4 04/25/2022 0829   HCT 26.7 (L) 03/17/2023 1643   HCT 41.4 04/25/2022 0829   PLT 234 03/17/2023 1643   PLT 360 04/25/2022 0829   MCV 105.1 (H) 03/17/2023 1643   MCV 100 (H) 04/25/2022 0829   MCV 93 06/09/2012 2124   MCH 34.6 (H) 03/17/2023 1643   MCHC 33.0 03/17/2023 1643   RDW 17.5 (H) 03/17/2023 1643   RDW 12.2 04/25/2022 0829   RDW 13.1 06/09/2012 2124   LYMPHSABS 1.7 03/17/2023 1643   LYMPHSABS 1.9 04/25/2022 0829   LYMPHSABS 1.6 06/09/2012 2124   MONOABS 1.4 (H) 03/17/2023 1643   MONOABS 0.8 06/09/2012 2124   EOSABS 0.1 03/17/2023 1643   EOSABS 0.1 04/25/2022 0829   EOSABS 0.1 06/09/2012 2124   BASOSABS 0.0 03/17/2023 1643   BASOSABS 0.1 04/25/2022 0829   BASOSABS 0.1 06/09/2012 2124   No imaging to review.       No data to display           Assessment & Plan:  Derek Garza is a 62 year old male patient with a past medical history of DM II, HTN and HLD presenting to the pulmonary clinic as a referral from his PCP for  the evaluation of hypoxia noted at the preop evaluation for right inguinal hernia repair 03/16/2023.   He reports his oxygenation has been around 92 to 95% most of his life. Denies ever seeing numbers in the 80s. He denies any symptoms. No signs of sleep apnea. No clubbing and no thrombocytosis. Ddx [facility] - PH - COPD (less likely with no smoking history).   []  CXR  []  PFTs    Return in about 3 months (around 07/17/2023).  I spent 60 minutes caring for this patient today, including preparing to see the patient, obtaining a medical history , reviewing a separately obtained history, performing a medically appropriate examination and/or evaluation, counseling and educating the patient/family/caregiver, ordering medications, tests, or procedures, documenting clinical information in the electronic health record, and independently  interpreting results (not separately reported/billed) and communicating results to the patient/family/caregiver  Janann Colonel, MD [facility] 04/16/2023 9:06 AM

## 2023-05-02 ENCOUNTER — Ambulatory Visit: Admission: RE | Admit: 2023-05-02 | Payer: Medicare HMO | Source: Home / Self Care | Admitting: Gastroenterology

## 2023-05-02 SURGERY — COLONOSCOPY WITH PROPOFOL
Anesthesia: General

## 2023-05-03 ENCOUNTER — Ambulatory Visit: Payer: BLUE CROSS/BLUE SHIELD | Admitting: Neurology

## 2023-05-04 ENCOUNTER — Encounter: Payer: Self-pay | Admitting: Pulmonary Disease

## 2023-05-05 NOTE — Patient Instructions (Incomplete)

## 2023-05-08 ENCOUNTER — Encounter: Payer: Medicare HMO | Admitting: Nurse Practitioner

## 2023-05-08 ENCOUNTER — Encounter: Payer: Self-pay | Admitting: Nurse Practitioner

## 2023-05-08 DIAGNOSIS — E1142 Type 2 diabetes mellitus with diabetic polyneuropathy: Secondary | ICD-10-CM

## 2023-05-08 DIAGNOSIS — F3181 Bipolar II disorder: Secondary | ICD-10-CM

## 2023-05-08 DIAGNOSIS — D649 Anemia, unspecified: Secondary | ICD-10-CM

## 2023-05-08 DIAGNOSIS — I152 Hypertension secondary to endocrine disorders: Secondary | ICD-10-CM

## 2023-05-08 DIAGNOSIS — N400 Enlarged prostate without lower urinary tract symptoms: Secondary | ICD-10-CM

## 2023-05-08 DIAGNOSIS — G959 Disease of spinal cord, unspecified: Secondary | ICD-10-CM

## 2023-05-08 DIAGNOSIS — Z0000 Encounter for general adult medical examination without abnormal findings: Secondary | ICD-10-CM

## 2023-05-08 DIAGNOSIS — E1169 Type 2 diabetes mellitus with other specified complication: Secondary | ICD-10-CM

## 2023-05-08 NOTE — Progress Notes (Unsigned)
Patient: Derek Garza Date of Birth: 09-27-1960  Reason for Visit: Follow up History from: Patient Primary Neurologist: [facility]  ASSESSMENT AND PLAN 62 y.o. year old male   1.  Gait abnormality 2.  Worsening low back pain -EMG/NCV showed no evidence of large fiber peripheral neuropathy, mild chronic bilateral lumbar radiculopathy -MRI lumbar spine July 2023 showed multilevel degenerative changes, no significant canal stenosis, variable degree of foraminal narrowing, most pronounced on the right at L4-5 and left at L2-3. -CT angiogram June 2023 showed no evidence of large vessel disease -Recent A1c was 4.9 -Status post ACDF C3-4-5 with Dr. Danielle Dess June 2022 -Seems  to have stabilized since ACDF, has adjusted, no falls  -Follow-up in 1 year or sooner if needed, will check on his chronic conditions  DIAGNOSTIC DATA (LABS, IMAGING, TESTING) CT angiogram of the neck at [facility] on November 22, 2021 carotid atherosclerosis without significant focal stenosis, vertebral arteries are normal, degenerative and post surgical change of the cervical spine. Anterior fusion C3-6, no evidence of hardware failure, multilevel bony neuroforaminal stenosis, canal stenosis at C 5 6, C6-7   MRI of lumbar spine from December 01, 2021 at [facility], no misalignment, multilevel degenerative changes, no significant canal stenosis, variable degree of foraminal narrowing, most pronounced on the right at L4-5, and the left at L2-3, no evidence of nerve compression.    HISTORY  Brisco Boleyn, XR 62 year old male, seen in request by his primary care nurse practitioner Aura Dials and Dr. Charlotta Newton, [name], for evaluation of tendency to fall, initial evaluation was on September 24, 2020.   I reviewed and summarized the referring note.  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, he continually had recurrent similar episode of sudden overwhelming weakness from head to toes, if he does not steady himself he will fall, eventually went out on short-term disability since June 04, 2020   He uses dizziness to describe his symptoms, on further questioning, he denies vertigo, almost all episodes happen in a standing position, he had sudden onset overwhelming weakness starting from his head traveling to his whole body, if he does not hold onto something, steady himself he would fall to the ground, this episode lasted about couple minutes, he denies loss of consciousness,   At baseline, he denies significant gait change, denies neck pain, denies bowel bladder incontinence,   Reviewed emergency room record, he presented on November 16, 2018 for fall CT cervical spine reviewed with patient, severe multilevel cervical spondylosis, worst at C4 7, disc osteophyte protruding into canal, causing variable degree of canal stenosis   Update March 23, 2021: He had MRI of cervical spine on Oct 07, 2020, personally reviewed the film, which showed evidence of multilevel degenerative changes, C5-6, severe spinal stenosis with severe bilateral foraminal stenosis, myelomalacia within the spinal cord, moderate spinal stenosis C4-5, severe  bilateral foraminal stenosis, C3-4 with severe foraminal stenosis  He underwent anterior ACDF C3-4-5 per patient by Dr. Danielle Dess  in June 2020, recovering well, continue have some stiff neck, but decrease the pain, continue has mild gait abnormality, lower extremity paresthesia, intermittent low back pain, denies bowel and bladder incontinence,  In addition, he complains of frequent dizziness, especially when he get up quickly from sitting position  Blood pressure/heart rate today, sitting down 134/81, 102; getting up 107/67, 116;, standing up from 1 minutes 104/62, 111  He is on long term disability, because his frequent dizziness, unsteady gait,  Reviewed laboratory evaluation in 2022: Normal B12, PSA, TSH, lipid panel, CBC, hemoglobin 14.4, CMP, creatinine of 0.94, A1c 6.8,   EMG nerve conduction study March 23, 2021 showed no large fiber peripheral neuropathy, chronic mild bilateral lumbosacral radiculopathy, no evidence of active process.  Above findings could not rule out possibility of small fiber neuropathy   UPDATE Oct 26 2021: He continued to complaint of unsteady gait, especially with sudden positional turning, bilateral lower extremity paresthesia, limited range of motion of his neck,  He has low back pain, radiating pain to right lower extremity  MRI in 2011 at [facility] reported to multilevel degenerative changes, left paracentral small herniated disc at L2-3, compressing the left ventro lateral aspect of the thecal sac, with mild central canal stenosis, broad-based, right paracentral disc herniation with disc extruded inferiorly from L4-5, mild central canal stenosis, impinge upon the descending right nerve roots at L4-5   He denies significant upper extremity numbness, weakness, has urinary urgency,   He also complains of lightheaded, unsteadiness sensation when getting up from seated position,   Personally reviewed MRI of the brain with and without contrast March 2022, mild small vessel disease, no acute abnormality.  Laboratory in 23, normal CBC, TSH, RPR, ANA, ferritin, C-reactive protein,  copper, no M protein spike, CMP showed normal creatinine 1.07, lipid panel LDL 74  Update May 04, 2022 SS: Low back pain is variable,  radiates down left leg occasionally. No falls, but he is cautious. Is now on social security long term disability. Dizziness is better, he is mindful. Last A1C 4.9 04/25/22.  No longer rides his motorcycle due to poor feeling in his feet from neuropathy.  He is weaker over time, but continues independently with awareness of his limitations.  He has no real complaints today.  History of bipolar disorder is on Tegretol XR and Seroquel.  Update May 09, 2023 SS:   REVIEW OF SYSTEMS: Out of a complete 14 system review of symptoms, the patient complains only of the following symptoms, and all other reviewed systems are negative.  See HPI  ALLERGIES: Allergies  Allergen Reactions   Acetaminophen    Oxycodone Hcl    Percocet [Oxycodone-Acetaminophen] Nausea Only    HOME MEDICATIONS: Outpatient Medications Prior to Visit  Medication Sig Dispense Refill   atorvastatin (LIPITOR) 10 MG tablet Take 1 tablet (10 mg total) by mouth daily. 90 tablet 4   carbamazepine (TEGRETOL XR) 200 MG 12 hr tablet 200 mg 2 (two) times daily.     cyclobenzaprine (FLEXERIL) 10 MG tablet take 1 tablet by oral route 3 times a day as needed for mscle spasm     empagliflozin (JARDIANCE) 25 MG TABS tablet Take 1 tablet (25 mg total) by mouth daily. 90 tablet 4   furosemide (LASIX) 80 MG tablet Take 1 tablet (80 mg total) by mouth daily. 90 tablet 4   gabapentin (NEURONTIN) 600  MG tablet Take 1 tablet (600 mg total) by mouth 2 (two) times daily. 180 tablet 4   potassium chloride (KLOR-CON M10) 10 MEQ tablet TAKE ONE TABLET BY MOUTH DAILY 90 tablet 3   potassium chloride (KLOR-CON) 10 MEQ tablet Take 1 tablet (10 mEq total) by mouth daily. 90 tablet 4   QUEtiapine (SEROQUEL) 400 MG tablet Take 400 mg by mouth daily.      tadalafil (CIALIS) 20 MG tablet Take 1 tablet (20 mg total) by  mouth daily as needed for erectile dysfunction. 100 tablet 0   traMADol (ULTRAM) 50 MG tablet Take 50 mg by mouth every 12 (twelve) hours as needed for moderate pain (pain score 4-6).     No facility-administered medications prior to visit.    PAST MEDICAL HISTORY: Past Medical History:  Diagnosis Date   Bipolar disorder (HCC)    Cervical myelopathy (HCC)    Diabetes mellitus without complication (HCC)    Erectile dysfunction    Gait abnormality    Hyperlipidemia    Hypertension    Neuromuscular disorder (HCC)    Paresthesia    Peripheral neuropathy    Right inguinal hernia     PAST SURGICAL HISTORY: Past Surgical History:  Procedure Laterality Date   ANKLE SURGERY  05/29/1977   APPENDECTOMY  05/29/2009   CERVICAL SPINE SURGERY     HERNIA REPAIR  [id number]   INGUINAL HERNIA REPAIR Right 03/16/2023   Procedure: HERNIA REPAIR INGUINAL ADULT, open;  Surgeon: Kandis Cocking, MD;  Location: [facility] ORS;  Service: General;  Laterality: Right;   INSERTION OF MESH  03/16/2023   Procedure: INSERTION OF MESH;  Surgeon: Kandis Cocking, MD;  Location: [facility] ORS;  Service: General;;   SPINE SURGERY  10/2018   VASECTOMY      FAMILY HISTORY: Family History  Problem Relation Age of Onset   Breast cancer Sister     SOCIAL HISTORY: Social History   Socioeconomic History   Marital status: Divorced    Spouse name: Not on file   Number of children: Not on file   Years of education: Not on file   Highest education level: Not on file  Occupational History   Occupation: on [employer]  Tobacco Use   Smoking status: Never   Smokeless tobacco: Never  Vaping Use   Vaping status: Never Used  Substance and Sexual Activity   Alcohol use: Yes    Comment: on occasion   Drug use: No   Sexual activity: Not Currently  Other Topics Concern   Not on file  Social History Narrative   Lives alone   Right Handed   Drinks 2 liter of soda daily   Social Determinants of Health   Financial  Resource Strain: Not on file  Food Insecurity: Not on file  Transportation Needs: Not on file  Physical Activity: Not on file  Stress: Not on file  Social Connections: Not on file  Intimate Partner Violence: Not on file    PHYSICAL EXAM  There were no vitals filed for this visit.  There is no height or weight on file to calculate BMI.  Generalized: Well developed, in no acute distress  Neurological examination  Mentation: Alert oriented to time, place, history taking. Follows all commands speech and language fluent Cranial nerve II-XII: Pupils were equal round reactive to light. Extraocular movements were full, visual field were full on confrontational test. Facial sensation and strength were normal.. Head turning and shoulder shrug  were normal and symmetric.  Motor: The motor testing reveals 5 over 5 strength of all 4 extremities. Good symmetric motor tone is noted throughout.  No significant weakness was noted. Sensory: Length dependent decreased sensation to light touch to mid shin Coordination: Cerebellar testing reveals good finger-nose-finger and heel-to-shin bilaterally.  Gait and station: Gait is normal. Tandem gait is unsteady. Reflexes: Deep tendon reflexes are symmetric and normal bilaterally.   Lab Results  Component Value Date   WBC 10.0 03/17/2023   HGB 8.8 (L) 03/17/2023   HCT 26.7 (L) 03/17/2023   MCV 105.1 (H) 03/17/2023   PLT 234 03/17/2023      Component Value Date/Time   NA 132 (L) 03/17/2023 1643   NA 136 10/25/2022 0822   NA 142 06/09/2012 2124   K 3.7 03/17/2023 1643   K 3.7 06/09/2012 2124   CL 100 03/17/2023 1643   CL 108 (H) 06/09/2012 2124   CO2 24 03/17/2023 1643   CO2 26 06/09/2012 2124   GLUCOSE 107 (H) 03/17/2023 1643   GLUCOSE 89 06/09/2012 2124   BUN 16 03/17/2023 1643   BUN 12 10/25/2022 0822   BUN 11 06/09/2012 2124   CREATININE 0.78 03/17/2023 1643   CREATININE 1.02 06/09/2012 2124   CALCIUM 8.5 (L) 03/17/2023 1643   CALCIUM 8.3  (L) 06/09/2012 2124   PROT 6.8 03/17/2023 1643   PROT 7.2 10/25/2022 0822   PROT 6.4 06/09/2012 2124   ALBUMIN 3.6 03/17/2023 1643   ALBUMIN 4.6 10/25/2022 0822   ALBUMIN 3.5 06/09/2012 2124   AST 18 03/17/2023 1643   AST 19 05/14/2018 0912   AST 16 06/09/2012 2124   ALT 12 03/17/2023 1643   ALT 22 05/14/2018 0912   ALT 22 06/09/2012 2124   ALKPHOS 82 03/17/2023 1643   ALKPHOS 73 06/09/2012 2124   BILITOT 1.7 (H) 03/17/2023 1643   BILITOT 0.8 10/25/2022 0822   BILITOT 0.5 06/09/2012 2124   GFRNONAA >60 03/17/2023 1643   GFRNONAA >60 06/09/2012 2124   GFRAA 90 06/29/2020 0844   GFRAA >60 06/09/2012 2124   Lab Results  Component Value Date   CHOL 158 10/25/2022   HDL 63 10/25/2022   LDLCALC 80 10/25/2022   TRIG 77 10/25/2022   CHOLHDL 3.0 11/12/2018   Lab Results  Component Value Date   HGBA1C 3.2 (L) 10/25/2022   Lab Results  Component Value Date   VITAMINB12 >2000 (H) 12/03/2020   Lab Results  Component Value Date   TSH 2.540 04/25/2022    Margie Ege, AGNP-C, DNP 05/08/2023, 5:48 AM [facility] [address], [address] [place], [place] [address] [phone]

## 2023-05-09 ENCOUNTER — Ambulatory Visit: Payer: BLUE CROSS/BLUE SHIELD | Admitting: Neurology

## 2023-05-09 ENCOUNTER — Encounter: Payer: Self-pay | Admitting: Neurology

## 2023-06-04 ENCOUNTER — Encounter: Payer: Self-pay | Admitting: *Deleted

## 2023-06-13 ENCOUNTER — Other Ambulatory Visit: Payer: Self-pay | Admitting: Nurse Practitioner

## 2023-06-13 MED ORDER — FUROSEMIDE 80 MG PO TABS: 80.0000 mg | ORAL_TABLET | Freq: Every day | ORAL | 0 refills | Status: DC

## 2023-06-13 MED ORDER — ATORVASTATIN CALCIUM 10 MG PO TABS: 10.0000 mg | ORAL_TABLET | Freq: Every day | ORAL | 0 refills | Status: DC

## 2023-06-13 MED ORDER — POTASSIUM CHLORIDE ER 10 MEQ PO TBCR: 10.0000 meq | EXTENDED_RELEASE_TABLET | Freq: Every day | ORAL | 0 refills | Status: DC

## 2023-06-13 MED ORDER — CARBAMAZEPINE ER 200 MG PO TB12: 200.0000 mg | ORAL_TABLET | Freq: Two times a day (BID) | ORAL | 0 refills | Status: DC

## 2023-06-13 MED ORDER — EMPAGLIFLOZIN 25 MG PO TABS: 25.0000 mg | ORAL_TABLET | Freq: Every day | ORAL | 0 refills | Status: DC

## 2023-06-13 NOTE — Addendum Note (Signed)
 Addended by: Giovonni Poirier T on: 06/13/2023 05:15 PM   Modules accepted: Orders

## 2023-06-13 NOTE — Telephone Encounter (Signed)
Patient now using mail order pharmacy 

## 2023-06-13 NOTE — Telephone Encounter (Signed)
 Medication Refill -  Most Recent Primary Care Visit:  Provider: CANNADY, JOLENE T  Department: [facility]  Visit Type: OFFICE VISIT  Date: 02/02/2023  Medication: empagliflozin  (JARDIANCE ) 25 MG TABS tablet   Has the patient contacted their pharmacy? No  Is this the correct pharmacy for this prescription? Yes If no, delete pharmacy and type the correct one.  This is the patient's preferred pharmacy:  [facility] [address] - [place], [place] - [address] [place] [address] Phone: [phone] Fax: [phone]   Has the prescription been filled recently? Yes  Is the patient out of the medication? Yes  Has the patient been seen for an appointment in the last year OR does the patient have an upcoming appointment? Yes  Can we respond through MyChart? No  Agent: Please be advised that Rx refills may take up to 3 business days. We ask that you follow-up with your pharmacy.  Patient has not taken med for a little over a week

## 2023-07-01 DIAGNOSIS — E119 Type 2 diabetes mellitus without complications: Secondary | ICD-10-CM | POA: Insufficient documentation

## 2023-07-01 NOTE — Patient Instructions (Signed)
 Be Involved in Caring For Your Health:  Taking Medications When medications are taken as directed, they can greatly improve your health. But if they are not taken as prescribed, they may not work. In some cases, not taking them correctly can be harmful. To help ensure your treatment remains effective and safe, understand your medications and how to take them. Bring your medications to each visit for review by your provider.  Your lab results, notes, and after visit summary will be available on My Chart. We strongly encourage you to use this feature. If lab results are abnormal the clinic will contact you with the appropriate steps. If the clinic does not contact you assume the results are satisfactory. You can always view your results on My Chart. If you have questions regarding your health or results, please contact the clinic during office hours. You can also ask questions on My Chart.  We at [facility] are grateful that you chose [place] to provide your care. We strive to provide evidence-based and compassionate care and are always looking for feedback. If you get a survey from the clinic please complete this so we can hear your opinions.  Diabetes Mellitus and Foot Care Diabetes, also called diabetes mellitus, may cause problems with your feet and legs because of poor blood flow (circulation). Poor circulation may make your skin: Become thinner and drier. Break more easily. Heal more slowly. Peel and crack. You may also have nerve damage (neuropathy). This can cause decreased feeling in your legs and feet. This means that you may not notice minor injuries to your feet that could lead to more serious problems. Finding and treating problems early is the best way to prevent future foot problems. How to care for your feet Foot hygiene  Wash your feet daily with warm water and mild soap. Do not use hot water. Then, pat your feet and the areas between your toes until they are fully dry. Do  not soak your feet. This can dry your skin. Trim your toenails straight across. Do not dig under them or around the cuticle. File the edges of your nails with an emery board or nail file. Apply a moisturizing lotion or petroleum jelly to the skin on your feet and to dry, brittle toenails. Use lotion that does not contain alcohol and is unscented. Do not apply lotion between your toes. Shoes and socks Wear clean socks or stockings every day. Make sure they are not too tight. Do not wear knee-high stockings. These may decrease blood flow to your legs. Wear shoes that fit well and have enough cushioning. Always look in your shoes before you put them on to be sure there are no objects inside. To break in new shoes, wear them for just a few hours a day. This prevents injuries on your feet. Wounds, scrapes, corns, and calluses  Check your feet daily for blisters, cuts, bruises, sores, and redness. If you cannot see the bottom of your feet, use a mirror or ask someone for help. Do not cut off corns or calluses or try to remove them with medicine. If you find a minor scrape, cut, or break in the skin on your feet, keep it and the skin around it clean and dry. You may clean these areas with mild soap and water. Do not clean the area with peroxide, alcohol, or iodine. If you have a wound, scrape, corn, or callus on your foot, look at it several times a day to make sure it  is healing and not infected. Check for: Redness, swelling, or pain. Fluid or blood. Warmth. Pus or a bad smell. General tips Do not cross your legs. This may decrease blood flow to your feet. Do not use heating pads or hot water bottles on your feet. They may burn your skin. If you have lost feeling in your feet or legs, you may not know this is happening until it is too late. Protect your feet from hot and cold by wearing shoes, such as at the beach or on hot pavement. Schedule a complete foot exam at least once a year or more often if  you have foot problems. Report any cuts, sores, or bruises to your health care provider right away. Where to find more information American Diabetes Association: diabetes.org Association of Diabetes Care & Education Specialists: diabeteseducator.org Contact a health care provider if: You have a condition that increases your risk of infection, and you have any cuts, sores, or bruises on your feet. You have an injury that is not healing. You have redness on your legs or feet. You feel burning or tingling in your legs or feet. You have pain or cramps in your legs and feet. Your legs or feet are numb. Your feet always feel cold. You have pain around any toenails. Get help right away if: You have a wound, scrape, corn, or callus on your foot and: You have signs of infection. You have a fever. You have a red line going up your leg. This information is not intended to replace advice given to you by your health care provider. Make sure you discuss any questions you have with your health care provider. Document Revised: 11/16/2021 Document Reviewed: 11/16/2021 Elsevier Patient Education  2024 [employer].

## 2023-07-05 ENCOUNTER — Ambulatory Visit: Payer: Medicare PPO | Admitting: Nurse Practitioner

## 2023-07-05 ENCOUNTER — Encounter: Payer: Self-pay | Admitting: Nurse Practitioner

## 2023-07-05 VITALS — BP 104/64 | HR 80 | Temp 97.7°F | Ht 72.0 in | Wt 188.0 lb

## 2023-07-05 DIAGNOSIS — G959 Disease of spinal cord, unspecified: Secondary | ICD-10-CM

## 2023-07-05 DIAGNOSIS — Z0000 Encounter for general adult medical examination without abnormal findings: Secondary | ICD-10-CM | POA: Diagnosis not present

## 2023-07-05 DIAGNOSIS — E1142 Type 2 diabetes mellitus with diabetic polyneuropathy: Secondary | ICD-10-CM | POA: Diagnosis not present

## 2023-07-05 DIAGNOSIS — F3181 Bipolar II disorder: Secondary | ICD-10-CM

## 2023-07-05 DIAGNOSIS — Z23 Encounter for immunization: Secondary | ICD-10-CM

## 2023-07-05 DIAGNOSIS — E1169 Type 2 diabetes mellitus with other specified complication: Secondary | ICD-10-CM

## 2023-07-05 DIAGNOSIS — E119 Type 2 diabetes mellitus without complications: Secondary | ICD-10-CM

## 2023-07-05 DIAGNOSIS — Z7984 Long term (current) use of oral hypoglycemic drugs: Secondary | ICD-10-CM

## 2023-07-05 DIAGNOSIS — N400 Enlarged prostate without lower urinary tract symptoms: Secondary | ICD-10-CM

## 2023-07-05 DIAGNOSIS — E1159 Type 2 diabetes mellitus with other circulatory complications: Secondary | ICD-10-CM | POA: Diagnosis not present

## 2023-07-05 DIAGNOSIS — I152 Hypertension secondary to endocrine disorders: Secondary | ICD-10-CM

## 2023-07-05 DIAGNOSIS — Z1211 Encounter for screening for malignant neoplasm of colon: Secondary | ICD-10-CM | POA: Diagnosis not present

## 2023-07-05 LAB — BAYER DCA HB A1C WAIVED: HB A1C (BAYER DCA - WAIVED): 5.6 % (ref 4.8–5.6)

## 2023-07-05 LAB — MICROALBUMIN, URINE WAIVED
Creatinine, Urine Waived: 50 mg/dL (ref 10–300)
Microalb, Ur Waived: 30 mg/L — ABNORMAL HIGH (ref 0–19)

## 2023-07-05 MED ORDER — FUROSEMIDE 80 MG PO TABS: 80.0000 mg | ORAL_TABLET | Freq: Every day | ORAL | 2 refills | Status: AC

## 2023-07-05 MED ORDER — ATORVASTATIN CALCIUM 10 MG PO TABS: 10.0000 mg | ORAL_TABLET | Freq: Every day | ORAL | 2 refills | Status: DC

## 2023-07-05 MED ORDER — EMPAGLIFLOZIN 25 MG PO TABS: 25.0000 mg | ORAL_TABLET | Freq: Every day | ORAL | 2 refills | Status: AC

## 2023-07-05 MED ORDER — GABAPENTIN 600 MG PO TABS: 600.0000 mg | ORAL_TABLET | Freq: Two times a day (BID) | ORAL | 4 refills | Status: AC

## 2023-07-05 MED ORDER — POTASSIUM CHLORIDE ER 10 MEQ PO TBCR: 10.0000 meq | EXTENDED_RELEASE_TABLET | Freq: Every day | ORAL | 2 refills | Status: AC

## 2023-07-05 NOTE — Assessment & Plan Note (Signed)
 Refer to diabetes with neuropathy plan of care

## 2023-07-05 NOTE — Assessment & Plan Note (Signed)
 Chronic, stable with A1c well below goal at 5.6% -- PRAISED FOR THIS, and urine ALB 30 February 2025.  Continue Gabapentin  for neuropathy pain and dose renally as needed.  At this time recommend he monitor BS daily and document for visits + focus on diabetic diet. Return in 6 months. - Recommend he schedule eye exam - Foot exam up to date - Vaccinations, except Shingrix , up to date. - Not on ACE due to side effects with this.  Is on Statin.

## 2023-07-05 NOTE — Assessment & Plan Note (Signed)
 Chronic, ongoing.  Continue current medication regimen and adjust as needed. Lipid panel today.

## 2023-07-05 NOTE — Assessment & Plan Note (Signed)
 Chronic, stable.  BP well below goal today.  Continue current medications and adjust as needed.  Recommend he monitor BP at least a few mornings a week at home and document for provider visits.  DASH diet at home. Labs: CBC, TSH, CMP and urine ALB.  Urine ALB 30 February 2025.  Continue collaboration with [facility] cardiology.  Return in 6 months.

## 2023-07-05 NOTE — Assessment & Plan Note (Signed)
 Chronic, ongoing.  Denies SI/HI.  At this time continue collaboration with psychiatry and current medication regimen as prescribed by them.

## 2023-07-05 NOTE — Progress Notes (Signed)
 BP 104/64   Pulse 80   Temp 97.7 F (36.5 C) (Oral)   Ht 6' (1.829 m)   Wt 188 lb (85.3 kg)   SpO2 96%   BMI 25.50 kg/m    Subjective:    Patient ID: Derek Garza, male    DOB: July 27, 1960, 63 y.o.   MRN: [id number]  HPI: Derek Garza is a 63 y.o. male presenting on 07/05/2023 for Medicare Wellness and  comprehensive medical examination + follow-up. Current medical complaints include:none  He currently lives with: self Interim Problems from his last visit: no  History of cervical myelopathy, last saw neurology 04/16/23.  DIABETES Last A1c in May 2024 was 3.2%.  Lost to follow-up since then.  Taking Jardiance  and wanted to continue this last visit, even though level well below goal. Hypoglycemic episodes:no Polydipsia/polyuria: no Visual disturbance: no Chest pain: no Paresthesias: no Glucose Monitoring: no  Accucheck frequency: Not Checking  Fasting glucose:  Post prandial:  Evening:  Before meals: Taking Insulin?: no  Long acting insulin:  Short acting insulin: Blood Pressure Monitoring: rarely Retinal Examination: Not up to Date -- [facility] Foot Exam: Up to Date Diabetic Education: Not Completed Pneumovax: Up to Date -- x 1 thus far Influenza: provided today  Aspirin: no   HYPERTENSION / HYPERLIPIDEMIA Continues on Lasix , K+, and Atorvastatin .  Saw pulmonary on 04/16/23 for assessment and overall reassuring. Cardiology last seen 02/09/22. Satisfied with current treatment? yes Duration of hypertension: chronic BP monitoring frequency: rarely BP range:  BP medication side effects: no Duration of hyperlipidemia: chronic Cholesterol medication side effects: no Cholesterol supplements: none Medication compliance: good compliance Aspirin: no Recent stressors: no Recurrent headaches: no Visual changes: no Palpitations: no Dyspnea: no Chest pain: no Lower extremity edema: no Dizzy/lightheaded: no   BIPOLAR DISORDER Continues on Seroquel and Tegretol   XR.  Follows with psychiatry, Dr. Theodoro at [place] in [place]. Mood status: controlled Satisfied with current treatment?: yes Symptom severity: moderate  Duration of current treatment : chronic Side effects: no Medication compliance: good compliance Psychotherapy/counseling: yes in the past Depressed mood: no Anxious mood: no Anhedonia: no Significant weight loss or gain: no Insomnia: none Fatigue: no Feelings of worthlessness or guilt: no Impaired concentration/indecisiveness: no Suicidal ideations: no Hopelessness: no Crying spells: no    07/05/2023    8:12 AM 02/02/2023    4:07 PM 10/25/2022    8:41 AM 08/14/2022    4:12 PM 04/25/2022    8:52 AM  Depression screen PHQ 2/9  Decreased Interest 0 0 1 1 0  Down, Depressed, Hopeless 0 0 0 0 0  PHQ - 2 Score 0 0 1 1 0  Altered sleeping 0 0 1 0 0  Tired, decreased energy 0 0 0 0 0  Change in appetite 0 0 0 0 0  Feeling bad or failure about yourself  0 0 0 0 0  Trouble concentrating 0 0 0 0 0  Moving slowly or fidgety/restless 0 0 0 0 0  Suicidal thoughts 0 0 0 0 0  PHQ-9 Score 0 0 2 1 0  Difficult doing work/chores Not difficult at all Not difficult at all   Not difficult at all       07/05/2023    8:12 AM 10/25/2022    8:42 AM 08/14/2022    4:13 PM 04/25/2022    8:52 AM  GAD 7 : Generalized Anxiety Score  Nervous, Anxious, on Edge 0 0 0 0  Control/stop worrying 0 0 0 0  Worry too much - different things 0 0 0 0  Trouble relaxing 0 0 0 0  Restless 0 0 0 0  Easily annoyed or irritable 0 0 0 0  Afraid - awful might happen 0 0 0 0  Total GAD 7 Score 0 0 0 0  Anxiety Difficulty Not difficult at all Not difficult at all  Not difficult at all   Functional Status Survey: Is the patient deaf or have difficulty hearing?: No Does the patient have difficulty seeing, even when wearing glasses/contacts?: No Does the patient have difficulty concentrating, remembering, or making decisions?: No Does the patient have difficulty walking  or climbing stairs?: No Does the patient have difficulty dressing or bathing?: No Does the patient have difficulty doing errands alone such as visiting a doctor's office or shopping?: No  FALL RISK:    07/05/2023    8:12 AM 02/02/2023    4:06 PM 08/14/2022    4:12 PM 08/24/2021   10:35 AM 10/13/2020    9:16 AM  Fall Risk   Falls in the past year? 1 0 1 1 1   Number falls in past yr: 1 0 1 1 1   Injury with Fall? 1 0 0 1 0  Risk for fall due to : History of fall(s) No Fall Risks History of fall(s) History of fall(s) Impaired balance/gait  Follow up Falls evaluation completed Falls evaluation completed Falls evaluation completed Falls evaluation completed Falls evaluation completed   Advanced Directives Does patient have a HCPOA?    no If yes, name and contact information:  Does patient have a living will or MOST form?  no  Past Medical History:  Past Medical History:  Diagnosis Date   Bipolar disorder (HCC)    Cervical myelopathy (HCC)    Diabetes mellitus without complication (HCC)    Erectile dysfunction    Gait abnormality    Hyperlipidemia    Hypertension    Neuromuscular disorder (HCC)    Paresthesia    Peripheral neuropathy    Right inguinal hernia     Surgical History:  Past Surgical History:  Procedure Laterality Date   ANKLE SURGERY  05/29/1977   APPENDECTOMY  05/29/2009   CERVICAL SPINE SURGERY     HERNIA REPAIR  [id number]   INGUINAL HERNIA REPAIR Right 03/16/2023   Procedure: HERNIA REPAIR INGUINAL ADULT, open;  Surgeon: Marinda Jayson KIDD, MD;  Location: [facility] ORS;  Service: General;  Laterality: Right;   INSERTION OF MESH  03/16/2023   Procedure: INSERTION OF MESH;  Surgeon: Marinda Jayson KIDD, MD;  Location: [facility] ORS;  Service: General;;   SPINE SURGERY  10/2018   VASECTOMY      Medications:  Current Outpatient Medications on File Prior to Visit  Medication Sig   carbamazepine  (TEGRETOL  XR) 200 MG 12 hr tablet Take 1 tablet (200 mg total) by mouth 2 (two)  times daily.   cyclobenzaprine (FLEXERIL) 10 MG tablet take 1 tablet by oral route 3 times a day as needed for mscle spasm   QUEtiapine (SEROQUEL) 400 MG tablet Take 400 mg by mouth daily.    tadalafil  (CIALIS ) 20 MG tablet Take 1 tablet (20 mg total) by mouth daily as needed for erectile dysfunction.   traMADol  (ULTRAM ) 50 MG tablet Take 50 mg by mouth every 12 (twelve) hours as needed for moderate pain (pain score 4-6).   No current facility-administered medications on file prior to visit.    Allergies:  Allergies  Allergen Reactions   Acetaminophen   Oxycodone  Hcl    Percocet [Oxycodone -Acetaminophen ] Nausea Only    Social History:  Social History   Socioeconomic History   Marital status: Divorced    Spouse name: Not on file   Number of children: Not on file   Years of education: Not on file   Highest education level: Not on file  Occupational History   Occupation: on [employer]  Tobacco Use   Smoking status: Never   Smokeless tobacco: Never  Vaping Use   Vaping status: Never Used  Substance and Sexual Activity   Alcohol use: Yes    Comment: on occasion   Drug use: No   Sexual activity: Not Currently  Other Topics Concern   Not on file  Social History Narrative   Lives alone   Right Handed   Drinks 2 liter of soda daily   Social Drivers of Health   Financial Resource Strain: Low Risk  (07/05/2023)   Overall Financial Resource Strain (CARDIA)    Difficulty of Paying Living Expenses: Not hard at all  Food Insecurity: No Food Insecurity (07/05/2023)   Hunger Vital Sign    Worried About Running Out of Food in the Last Year: Never true    Ran Out of Food in the Last Year: Never true  Transportation Needs: No Transportation Needs (07/05/2023)   PRAPARE - Administrator, Civil Service (Medical): No    Lack of Transportation (Non-Medical): No  Physical Activity: Sufficiently Active (07/05/2023)   Exercise Vital Sign    Days of Exercise per Week: 5 days    Minutes  of Exercise per Session: 30 min  Stress: No Stress Concern Present (07/05/2023)   [employer] of Occupational Health - Occupational Stress Questionnaire    Feeling of Stress : Only a little  Social Connections: Socially Isolated (07/05/2023)   Social Connection and Isolation Panel [NHANES]    Frequency of Communication with Friends and Family: Three times a week    Frequency of Social Gatherings with Friends and Family: Three times a week    Attends Religious Services: Never    Active Member of Clubs or Organizations: No    Attends Banker Meetings: Never    Marital Status: Divorced  Catering Manager Violence: Not At Risk (07/05/2023)   Humiliation, Afraid, Rape, and Kick questionnaire    Fear of Current or Ex-Partner: No    Emotionally Abused: No    Physically Abused: No    Sexually Abused: No   Social History   Tobacco Use  Smoking Status Never  Smokeless Tobacco Never   Social History   Substance and Sexual Activity  Alcohol Use Yes   Comment: on occasion    Family History:  Family History  Problem Relation Age of Onset   Breast cancer Sister     Past medical history, surgical history, medications, allergies, family history and social history reviewed with patient today and changes made to appropriate areas of the chart.   ROS All other ROS negative except what is listed above and in the HPI.      Objective:    BP 104/64   Pulse 80   Temp 97.7 F (36.5 C) (Oral)   Ht 6' (1.829 m)   Wt 188 lb (85.3 kg)   SpO2 96%   BMI 25.50 kg/m   Wt Readings from Last 3 Encounters:  07/05/23 188 lb (85.3 kg)  04/16/23 189 lb 9.6 oz (86 kg)  03/22/23 185 lb (83.9 kg)    Physical  Exam Vitals and nursing note reviewed.  Constitutional:      General: He is awake. He is not in acute distress.    Appearance: He is well-developed and well-groomed. He is not ill-appearing or toxic-appearing.  HENT:     Head: Normocephalic and atraumatic.     Right Ear:  Hearing, tympanic membrane, ear canal and external ear normal. No drainage.     Left Ear: Hearing, tympanic membrane, ear canal and external ear normal. No drainage.     Nose: Nose normal.     Mouth/Throat:     Pharynx: Uvula midline.  Eyes:     General: Lids are normal.        Right eye: No discharge.        Left eye: No discharge.     Extraocular Movements: Extraocular movements intact.     Conjunctiva/sclera: Conjunctivae normal.     Pupils: Pupils are equal, round, and reactive to light.     Visual Fields: Right eye visual fields normal and left eye visual fields normal.  Neck:     Thyroid: No thyromegaly.     Vascular: No carotid bruit or JVD.     Trachea: Trachea normal.  Cardiovascular:     Rate and Rhythm: Normal rate and regular rhythm.     Heart sounds: Normal heart sounds, S1 normal and S2 normal. No murmur heard.    No gallop.  Pulmonary:     Effort: Pulmonary effort is normal. No accessory muscle usage or respiratory distress.     Breath sounds: Normal breath sounds.  Abdominal:     General: Bowel sounds are normal.     Palpations: Abdomen is soft. There is no hepatomegaly or splenomegaly.     Tenderness: There is no abdominal tenderness.  Musculoskeletal:        General: Normal range of motion.     Cervical back: Normal range of motion and neck supple.     Right lower leg: No edema.     Left lower leg: No edema.  Lymphadenopathy:     Head:     Right side of head: No submental, submandibular, tonsillar, preauricular or posterior auricular adenopathy.     Left side of head: No submental, submandibular, tonsillar, preauricular or posterior auricular adenopathy.     Cervical: No cervical adenopathy.  Skin:    General: Skin is warm and dry.     Capillary Refill: Capillary refill takes less than 2 seconds.     Findings: No rash.  Neurological:     Mental Status: He is alert and oriented to person, place, and time.     Gait: Gait is intact.     Deep Tendon  Reflexes: Reflexes are normal and symmetric.     Reflex Scores:      Brachioradialis reflexes are 2+ on the right side and 2+ on the left side.      Patellar reflexes are 2+ on the right side and 2+ on the left side. Psychiatric:        Attention and Perception: Attention normal.        Mood and Affect: Mood normal.        Speech: Speech normal.        Behavior: Behavior normal. Behavior is cooperative.        Thought Content: Thought content normal.        Cognition and Memory: Cognition normal.       07/05/2023    8:51 AM  6CIT Screen  What Year? 0 points  What month? 0 points  What time? 0 points  Count back from 20 0 points  Months in reverse 0 points  Repeat phrase 0 points  Total Score 0 points   Diabetic Foot Exam - Simple   Simple Foot Form Visual Inspection No deformities, no ulcerations, no other skin breakdown bilaterally: Yes Sensation Testing Intact to touch and monofilament testing bilaterally: Yes Pulse Check Posterior Tibialis and Dorsalis pulse intact bilaterally: Yes Comments     Results for orders placed or performed during the hospital encounter of 03/17/23  CBC with Differential   Collection Time: 03/17/23  4:43 PM  Result Value Ref Range   WBC 10.0 4.0 - 10.5 K/uL   RBC 2.54 (L) 4.22 - 5.81 MIL/uL   Hemoglobin 8.8 (L) 13.0 - 17.0 g/dL   HCT 73.2 (L) 60.9 - 47.9 %   MCV 105.1 (H) 80.0 - 100.0 fL   MCH 34.6 (H) 26.0 - 34.0 pg   MCHC 33.0 30.0 - 36.0 g/dL   RDW 82.4 (H) 88.4 - 84.4 %   Platelets 234 150 - 400 K/uL   nRBC 0.0 0.0 - 0.2 %   Neutrophils Relative % 67 %   Neutro Abs 6.8 1.7 - 7.7 K/uL   Lymphocytes Relative 17 %   Lymphs Abs 1.7 0.7 - 4.0 K/uL   Monocytes Relative 14 %   Monocytes Absolute 1.4 (H) 0.1 - 1.0 K/uL   Eosinophils Relative 1 %   Eosinophils Absolute 0.1 0.0 - 0.5 K/uL   Basophils Relative 0 %   Basophils Absolute 0.0 0.0 - 0.1 K/uL   Immature Granulocytes 1 %   Abs Immature Granulocytes 0.05 0.00 - 0.07 K/uL   Comprehensive metabolic panel   Collection Time: 03/17/23  4:43 PM  Result Value Ref Range   Sodium 132 (L) 135 - 145 mmol/L   Potassium 3.7 3.5 - 5.1 mmol/L   Chloride 100 98 - 111 mmol/L   CO2 24 22 - 32 mmol/L   Glucose, Bld 107 (H) 70 - 99 mg/dL   BUN 16 8 - 23 mg/dL   Creatinine, Ser 9.21 0.61 - 1.24 mg/dL   Calcium  8.5 (L) 8.9 - 10.3 mg/dL   Total Protein 6.8 6.5 - 8.1 g/dL   Albumin 3.6 3.5 - 5.0 g/dL   AST 18 15 - 41 U/L   ALT 12 0 - 44 U/L   Alkaline Phosphatase 82 38 - 126 U/L   Total Bilirubin 1.7 (H) 0.3 - 1.2 mg/dL   GFR, Estimated >39 >39 mL/min   Anion gap 8 5 - 15  Lactic acid, plasma   Collection Time: 03/17/23  4:43 PM  Result Value Ref Range   Lactic Acid, Venous 0.9 0.5 - 1.9 mmol/L  Urinalysis, Routine w reflex microscopic -Urine, Clean Catch   Collection Time: 03/17/23  8:18 PM  Result Value Ref Range   Color, Urine YELLOW (A) YELLOW   APPearance CLEAR (A) CLEAR   Specific Gravity, Urine 1.041 (H) 1.005 - 1.030   pH 5.0 5.0 - 8.0   Glucose, UA >=500 (A) NEGATIVE mg/dL   Hgb urine dipstick SMALL (A) NEGATIVE   Bilirubin Urine NEGATIVE NEGATIVE   Ketones, ur 20 (A) NEGATIVE mg/dL   Protein, ur NEGATIVE NEGATIVE mg/dL   Nitrite NEGATIVE NEGATIVE   Leukocytes,Ua NEGATIVE NEGATIVE   RBC / HPF 0-5 0 - 5 RBC/hpf   WBC, UA 0-5 0 - 5 WBC/hpf   Bacteria, UA NONE SEEN NONE  SEEN   Squamous Epithelial / HPF 0 0 - 5 /HPF   Mucus PRESENT       Assessment & Plan:   Problem List Items Addressed This Visit       Cardiovascular and Mediastinum   Hypertension associated with diabetes (HCC)   Chronic, stable.  BP well below goal today.  Continue current medications and adjust as needed.  Recommend he monitor BP at least a few mornings a week at home and document for provider visits.  DASH diet at home. Labs: CBC, TSH, CMP and urine ALB.  Urine ALB 30 February 2025.  Continue collaboration with [facility] cardiology.  Return in 6 months.      Relevant Medications    empagliflozin  (JARDIANCE ) 25 MG TABS tablet   furosemide  (LASIX ) 80 MG tablet   atorvastatin  (LIPITOR) 10 MG tablet   Other Relevant Orders   Bayer DCA Hb A1c Waived   Microalbumin, Urine Waived   Comprehensive metabolic panel   CBC with Differential/Platelet   TSH     Endocrine   Diabetes mellitus treated with oral medication (HCC)   Refer to diabetes with neuropathy plan of care.      Relevant Medications   empagliflozin  (JARDIANCE ) 25 MG TABS tablet   atorvastatin  (LIPITOR) 10 MG tablet   Other Relevant Orders   Bayer DCA Hb A1c Waived   Microalbumin, Urine Waived   Comprehensive metabolic panel   DM type 2 with diabetic peripheral neuropathy (HCC)   Chronic, stable with A1c well below goal at 5.6% -- PRAISED FOR THIS, and urine ALB 30 February 2025.  Continue Gabapentin  for neuropathy pain and dose renally as needed.  At this time recommend he monitor BS daily and document for visits + focus on diabetic diet. Return in 6 months. - Recommend he schedule eye exam - Foot exam up to date - Vaccinations, except Shingrix , up to date. - Not on ACE due to side effects with this.  Is on Statin.      Relevant Medications   empagliflozin  (JARDIANCE ) 25 MG TABS tablet   gabapentin  (NEURONTIN ) 600 MG tablet   atorvastatin  (LIPITOR) 10 MG tablet   Other Relevant Orders   Bayer DCA Hb A1c Waived   Microalbumin, Urine Waived   Comprehensive metabolic panel   Hyperlipidemia associated with type 2 diabetes mellitus (HCC)   Chronic, ongoing.  Continue current medication regimen and adjust as needed.  Lipid panel today.      Relevant Medications   empagliflozin  (JARDIANCE ) 25 MG TABS tablet   furosemide  (LASIX ) 80 MG tablet   atorvastatin  (LIPITOR) 10 MG tablet   Other Relevant Orders   Bayer DCA Hb A1c Waived   Comprehensive metabolic panel   Lipid Panel w/o Chol/HDL Ratio     Nervous and Auditory   Cervical myelopathy (HCC)   Stable.  Continue collaboration with neurosurgery as  needed, fusion in June 2022.         Other   Bipolar 2 disorder (HCC)   Chronic, ongoing.  Denies SI/HI.  At this time continue collaboration with psychiatry and current medication regimen as prescribed by them.      Other Visit Diagnoses       Medicare annual wellness visit, initial    -  Primary   Medicare Wellness due and performed with patient today.     Benign prostatic hyperplasia without lower urinary tract symptoms       PSA on labs today   Relevant Orders   PSA  Colon cancer screening       GI referral placed and discussed.   Relevant Orders   Ambulatory referral to Gastroenterology     Flu vaccine need       Flu vaccine today, educated patient.   Relevant Orders   Flu vaccine trivalent PF, 6mos and older(Flulaval,Afluria,Fluarix,Fluzone)     Encounter for annual physical exam       Annual physical today with labs and health maintenance reviewed, discussed with patient.       Discussed aspirin prophylaxis for myocardial infarction prevention and decision was it was not indicated  LABORATORY TESTING:  Health maintenance labs ordered today as discussed above.   The natural history of prostate cancer and ongoing controversy regarding screening and potential treatment outcomes of prostate cancer has been discussed with the patient. The meaning of a false positive PSA and a false negative PSA has been discussed. He indicates understanding of the limitations of this screening test and wishes to proceed with screening PSA testing.   IMMUNIZATIONS:   - Tetanus vaccination status reviewed: last tetanus booster within 10 years. - Influenza: Administered today - Pneumovax: Up to date - Prevnar: Not applicable - Zostavax vaccine: Refused  SCREENING: - Colonoscopy: Ordered today  Discussed with patient purpose of the colonoscopy is to detect colon cancer at curable precancerous or early stages   - AAA Screening: Not applicable  -Hearing Test: Not applicable   -Spirometry: Not applicable   PATIENT COUNSELING:    Sexuality: Discussed sexually transmitted diseases, partner selection, use of condoms, avoidance of unintended pregnancy  and contraceptive alternatives.   Advised to avoid cigarette smoking.  I discussed with the patient that most people either abstain from alcohol or drink within safe limits (<=14/week and <=4 drinks/occasion for males, <=7/weeks and <= 3 drinks/occasion for females) and that the risk for alcohol disorders and other health effects rises proportionally with the number of drinks per week and how often a drinker exceeds daily limits.  Discussed cessation/primary prevention of drug use and availability of treatment for abuse.   Diet: Encouraged to adjust caloric intake to maintain  or achieve ideal body weight, to reduce intake of dietary saturated fat and total fat, to limit sodium intake by avoiding high sodium foods and not adding table salt, and to maintain adequate dietary potassium and calcium  preferably from fresh fruits, vegetables, and low-fat dairy products.    Stressed the importance of regular exercise  Injury prevention: Discussed safety belts, safety helmets, smoke detector, smoking near bedding or upholstery.   Dental health: Discussed importance of regular tooth brushing, flossing, and dental visits.   Follow up plan: NEXT PREVENTATIVE PHYSICAL DUE IN 1 YEAR. Return in about 6 months (around 01/02/2024) for T2DM, HTN/HLD, MOOD.

## 2023-07-05 NOTE — Assessment & Plan Note (Signed)
Stable.  Continue collaboration with neurosurgery as needed, fusion in June 2022.

## 2023-07-06 ENCOUNTER — Encounter: Payer: Self-pay | Admitting: Nurse Practitioner

## 2023-07-06 LAB — COMPREHENSIVE METABOLIC PANEL
ALT: 25 [IU]/L (ref 0–44)
AST: 19 [IU]/L (ref 0–40)
Albumin: 4.5 g/dL (ref 3.9–4.9)
Alkaline Phosphatase: 122 [IU]/L — ABNORMAL HIGH (ref 44–121)
BUN/Creatinine Ratio: 17 (ref 10–24)
BUN: 18 mg/dL (ref 8–27)
Bilirubin Total: 0.3 mg/dL (ref 0.0–1.2)
CO2: 26 mmol/L (ref 20–29)
Calcium: 9.5 mg/dL (ref 8.6–10.2)
Chloride: 90 mmol/L — ABNORMAL LOW (ref 96–106)
Creatinine, Ser: 1.07 mg/dL (ref 0.76–1.27)
Globulin, Total: 2.7 g/dL (ref 1.5–4.5)
Glucose: 163 mg/dL — ABNORMAL HIGH (ref 70–99)
Potassium: 3.6 mmol/L (ref 3.5–5.2)
Sodium: 136 mmol/L (ref 134–144)
Total Protein: 7.2 g/dL (ref 6.0–8.5)
eGFR: 78 mL/min/{1.73_m2} (ref >59)

## 2023-07-06 LAB — CBC WITH DIFFERENTIAL/PLATELET
Basophils Absolute: 0 10*3/uL (ref 0.0–0.2)
Basos: 1 %
EOS (ABSOLUTE): 0.2 10*3/uL (ref 0.0–0.4)
Eos: 3 %
Hematocrit: 42.7 % (ref 37.5–51.0)
Hemoglobin: 14.2 g/dL (ref 13.0–17.7)
Immature Grans (Abs): 0 10*3/uL (ref 0.0–0.1)
Immature Granulocytes: 0 %
Lymphocytes Absolute: 2 10*3/uL (ref 0.7–3.1)
Lymphs: 32 %
MCH: 32 pg (ref 26.6–33.0)
MCHC: 33.3 g/dL (ref 31.5–35.7)
MCV: 96 fL (ref 79–97)
Monocytes Absolute: 0.7 10*3/uL (ref 0.1–0.9)
Monocytes: 12 %
Neutrophils Absolute: 3.2 10*3/uL (ref 1.4–7.0)
Neutrophils: 52 %
Platelets: 302 10*3/uL (ref 150–450)
RBC: 4.44 x10E6/uL (ref 4.14–5.80)
RDW: 11.9 % (ref 11.6–15.4)
WBC: 6.1 10*3/uL (ref 3.4–10.8)

## 2023-07-06 LAB — LIPID PANEL W/O CHOL/HDL RATIO
Cholesterol, Total: 176 mg/dL (ref 100–199)
HDL: 62 mg/dL (ref >39)
LDL Chol Calc (NIH): 97 mg/dL (ref 0–99)
Triglycerides: 92 mg/dL (ref 0–149)
VLDL Cholesterol Cal: 17 mg/dL (ref 5–40)

## 2023-07-06 LAB — PSA: Prostate Specific Ag, Serum: 1.5 ng/mL (ref 0.0–4.0)

## 2023-07-06 LAB — TSH: TSH: 2.81 u[IU]/mL (ref 0.450–4.500)

## 2023-07-06 NOTE — Progress Notes (Signed)
 Contacted via MyChart   Good afternoon Suyash, your labs have returned: - Kidney function, creatinine and eGFR, remains normal, as is liver function, AST and ALT.  - CBC shows no anemia or infection - Remainder of labs overall stable.  Continue all current medications.  Any questions? Keep being amazing!!  Thank you for allowing me to participate in your care.  I appreciate you. Kindest regards, Marella Vanderpol

## 2023-07-16 ENCOUNTER — Encounter: Payer: Self-pay | Admitting: *Deleted

## 2023-07-26 ENCOUNTER — Ambulatory Visit: Payer: Medicare HMO | Admitting: Pulmonary Disease

## 2023-10-09 LAB — HM DIABETES EYE EXAM

## 2023-11-18 ENCOUNTER — Other Ambulatory Visit: Payer: Self-pay | Admitting: Nurse Practitioner

## 2023-11-20 NOTE — Telephone Encounter (Signed)
 Too soon for refill.  Requested Prescriptions  Pending Prescriptions Disp Refills   furosemide  (LASIX ) 80 MG tablet [Pharmacy Med Name: FUROSEMIDE  80 MG TABLET] 90 tablet 4    Sig: TAKE 1 TABLET BY MOUTH EVERY DAY     Cardiovascular:  Diuretics - Loop Failed - 11/20/2023  2:17 PM      Failed - Cl in normal range and within 180 days    Chloride  Date Value Ref Range Status  07/05/2023 90 (L) 96 - 106 mmol/L Final  06/09/2012 108 (H) 98 - 107 mmol/L Final         Failed - Mg Level in normal range and within 180 days    No results found for: MG       Passed - K in normal range and within 180 days    Potassium  Date Value Ref Range Status  07/05/2023 3.6 3.5 - 5.2 mmol/L Final  06/09/2012 3.7 3.5 - 5.1 mmol/L Final         Passed - Ca in normal range and within 180 days    Calcium   Date Value Ref Range Status  07/05/2023 9.5 8.6 - 10.2 mg/dL Final   Calcium , Total  Date Value Ref Range Status  06/09/2012 8.3 (L) 8.5 - 10.1 mg/dL Final         Passed - Na in normal range and within 180 days    Sodium  Date Value Ref Range Status  07/05/2023 136 134 - 144 mmol/L Final  06/09/2012 142 136 - 145 mmol/L Final         Passed - Cr in normal range and within 180 days    Creatinine  Date Value Ref Range Status  06/09/2012 1.02 0.60 - 1.30 mg/dL Final   Creatinine, Ser  Date Value Ref Range Status  07/05/2023 1.07 0.76 - 1.27 mg/dL Final         Passed - Last BP in normal range    BP Readings from Last 1 Encounters:  07/05/23 104/64         Passed - Valid encounter within last 6 months    Recent Outpatient Visits           4 months ago Medicare annual wellness visit, initial   [facility] Cl[place], Melanie [name], NP       Future Appointments             In 1 month Cannady, Melanie [name], NP [place] [employer] [facility]

## 2023-12-20 ENCOUNTER — Other Ambulatory Visit: Payer: Self-pay | Admitting: Nurse Practitioner

## 2023-12-21 NOTE — Telephone Encounter (Signed)
 Requested Prescriptions  Refused Prescriptions Disp Refills   gabapentin  (NEURONTIN ) 600 MG tablet [Pharmacy Med Name: GABAPENTIN  600 MG TABLET] 180 tablet 4    Sig: TAKE 1 TABLET BY MOUTH TWICE A DAY     Neurology: Anticonvulsants - gabapentin  Passed - 12/21/2023 11:45 AM      Passed - Cr in normal range and within 360 days    Creatinine  Date Value Ref Range Status  06/09/2012 1.02 0.60 - 1.30 mg/dL Final   Creatinine, Ser  Date Value Ref Range Status  07/05/2023 1.07 0.76 - 1.27 mg/dL Final         Passed - Completed PHQ-2 or PHQ-9 in the last 360 days      Passed - Valid encounter within last 12 months    Recent Outpatient Visits           5 months ago Medicare annual wellness visit, initial   [place] [facility] [place], [place] T, NP       Future Appointments             In 2 weeks Cannady, Jolene T, NP [place] [facility]             potassium chloride  (KLOR-CON ) 10 MEQ tablet [Pharmacy Med Name: POTASSIUM CL ER 10 MEQ TAB WAX] 90 tablet 4    Sig: TAKE 1 TABLET BY MOUTH EVERY DAY     Endocrinology:  Minerals - Potassium Supplementation Passed - 12/21/2023 11:45 AM      Passed - K in normal range and within 360 days    Potassium  Date Value Ref Range Status  07/05/2023 3.6 3.5 - 5.2 mmol/L Final  06/09/2012 3.7 3.5 - 5.1 mmol/L Final         Passed - Cr in normal range and within 360 days    Creatinine  Date Value Ref Range Status  06/09/2012 1.02 0.60 - 1.30 mg/dL Final   Creatinine, Ser  Date Value Ref Range Status  07/05/2023 1.07 0.76 - 1.27 mg/dL Final         Passed - Valid encounter within last 12 months    Recent Outpatient Visits           5 months ago Medicare annual wellness visit, initial   [place] [facility] [place], Melanie T, NP       Future Appointments             In 2 weeks Cannady, Jolene T, NP [place] [facility], [facility]

## 2024-01-05 NOTE — Patient Instructions (Incomplete)
Be Involved in Caring For Your Health:  Taking Medications When medications are taken as directed, they can greatly improve your health. But if they are not taken as prescribed, they may not work. In some cases, not taking them correctly can be harmful. To help ensure your treatment remains effective and safe, understand your medications and how to take them. Bring your medications to each visit for review by your provider.  Your lab results, notes, and after visit summary will be available on My Chart. We strongly encourage you to use this feature. If lab results are abnormal the clinic will contact you with the appropriate steps. If the clinic does not contact you assume the results are satisfactory. You can always view your results on My Chart. If you have questions regarding your health or results, please contact the clinic during office hours. You can also ask questions on My Chart.  We at [facility] are grateful that you chose [place] to provide your care. We strive to provide evidence-based and compassionate care and are always looking for feedback. If you get a survey from the clinic please complete this so we can hear your opinions.  Diabetes Mellitus and Exercise Regular exercise is important for your health, especially if you have diabetes mellitus. Exercise is not just about losing weight. It can also help you increase muscle strength and bone density and reduce body fat and stress. This can help your level of endurance and make you more fit and flexible. Why should I exercise if I have diabetes? Exercise has many benefits for people with diabetes. It can: Help lower and control your blood sugar (glucose). Help your body respond better and become more sensitive to the hormone insulin. Reduce how much insulin your body needs. Lower your risk for heart disease by: Lowering how much "bad" cholesterol and triglycerides you have in your body. Increasing how much "good" cholesterol  you have in your body. Lowering your blood pressure. Lowering your blood glucose levels. What is my activity plan? Your health care provider or an expert trained in diabetes care (certified diabetes educator) can help you make an activity plan. This plan can help you find the type of exercise that works for you. It may also tell you how often to exercise and for how long. Be sure to: Get at least 150 minutes of medium-intensity or high-intensity exercise each week. This may involve brisk walking, biking, or water aerobics. Do stretching and strengthening exercises at least 2 times a week. This may involve yoga or weight lifting. Spread out your activity over at least 3 days of the week. Get some form of physical activity each day. Do not go more than 2 days in a row without some kind of activity. Avoid being inactive for more than 30 minutes at a time. Take frequent breaks to walk or stretch. Choose activities that you enjoy. Set goals that you know you can accomplish. Start slowly and increase the intensity of your exercise over time. How do I manage my diabetes during exercise?  Monitor your blood glucose Check your blood glucose before and after you exercise. If your blood glucose is 240 mg/dL (40.9 mmol/L) or higher before you exercise, check your urine for ketones. These are chemicals created by the liver. If you have ketones in your urine, do not exercise until your blood glucose returns to normal. If your blood glucose is 100 mg/dL (5.6 mmol/L) or lower, eat a snack that has 15-20 grams of carbohydrate in  it. Check your blood glucose 15 minutes after the snack to make sure that your level is above 100 mg/dL (5.6 mmol/L) before you start to exercise. Your risk for low blood glucose (hypoglycemia) goes up during and after exercise. Know the symptoms of this condition and how to treat it. Follow these instructions at home: Keep a carbohydrate snack on hand for use before, during, and after  exercise. This can help prevent or treat hypoglycemia. Avoid injecting insulin into parts of your body that are going to be used during exercise. This may include: Your arms, when you are going to play tennis. Your legs, when you are about to go jogging. Keep track of your exercise habits. This can help you and your health care provider watch and adjust your activity plan. Write down: What you eat before and after you exercise. Blood glucose levels before and after you exercise. The type and amount of exercise you do. Talk to your health care provider before you start a new activity. They may need to: Make sure that the activity is safe for you. Adjust your insulin, other medicines, and food that you eat. Drink water while you exercise. This can stop you from losing too much water (dehydration). It can also prevent problems caused by having a lot of heat in your body (heat stroke). Where to find more information American Diabetes Association: diabetes.org Association of Diabetes Care & Education Specialists: diabeteseducator.org This information is not intended to replace advice given to you by your health care provider. Make sure you discuss any questions you have with your health care provider. Document Revised: 11/02/2021 Document Reviewed: 11/02/2021 Elsevier Patient Education  2024 [employer].

## 2024-01-09 ENCOUNTER — Ambulatory Visit: Payer: Medicare PPO | Admitting: Nurse Practitioner

## 2024-01-09 DIAGNOSIS — E1159 Type 2 diabetes mellitus with other circulatory complications: Secondary | ICD-10-CM

## 2024-01-09 DIAGNOSIS — G959 Disease of spinal cord, unspecified: Secondary | ICD-10-CM

## 2024-01-09 DIAGNOSIS — F3181 Bipolar II disorder: Secondary | ICD-10-CM

## 2024-01-09 DIAGNOSIS — E1169 Type 2 diabetes mellitus with other specified complication: Secondary | ICD-10-CM

## 2024-01-09 DIAGNOSIS — E1142 Type 2 diabetes mellitus with diabetic polyneuropathy: Secondary | ICD-10-CM

## 2024-01-09 DIAGNOSIS — E119 Type 2 diabetes mellitus without complications: Secondary | ICD-10-CM

## 2024-02-03 NOTE — Patient Instructions (Signed)

## 2024-02-06 ENCOUNTER — Ambulatory Visit (INDEPENDENT_AMBULATORY_CARE_PROVIDER_SITE_OTHER): Admitting: Nurse Practitioner

## 2024-02-06 ENCOUNTER — Encounter: Payer: Self-pay | Admitting: Nurse Practitioner

## 2024-02-06 VITALS — BP 133/70 | HR 71 | Temp 98.7°F | Resp 16 | Ht 72.01 in | Wt 194.8 lb

## 2024-02-06 DIAGNOSIS — E785 Hyperlipidemia, unspecified: Secondary | ICD-10-CM

## 2024-02-06 DIAGNOSIS — E1142 Type 2 diabetes mellitus with diabetic polyneuropathy: Secondary | ICD-10-CM

## 2024-02-06 DIAGNOSIS — Z1211 Encounter for screening for malignant neoplasm of colon: Secondary | ICD-10-CM

## 2024-02-06 DIAGNOSIS — E1169 Type 2 diabetes mellitus with other specified complication: Secondary | ICD-10-CM

## 2024-02-06 DIAGNOSIS — Z23 Encounter for immunization: Secondary | ICD-10-CM | POA: Diagnosis not present

## 2024-02-06 DIAGNOSIS — Z7984 Long term (current) use of oral hypoglycemic drugs: Secondary | ICD-10-CM

## 2024-02-06 DIAGNOSIS — E1159 Type 2 diabetes mellitus with other circulatory complications: Secondary | ICD-10-CM | POA: Diagnosis not present

## 2024-02-06 DIAGNOSIS — I152 Hypertension secondary to endocrine disorders: Secondary | ICD-10-CM

## 2024-02-06 DIAGNOSIS — Z981 Arthrodesis status: Secondary | ICD-10-CM

## 2024-02-06 DIAGNOSIS — G959 Disease of spinal cord, unspecified: Secondary | ICD-10-CM

## 2024-02-06 DIAGNOSIS — F3181 Bipolar II disorder: Secondary | ICD-10-CM

## 2024-02-06 DIAGNOSIS — E119 Type 2 diabetes mellitus without complications: Secondary | ICD-10-CM | POA: Diagnosis not present

## 2024-02-06 LAB — BAYER DCA HB A1C WAIVED: HB A1C (BAYER DCA - WAIVED): 5.3 % (ref 4.8–5.6)

## 2024-02-06 NOTE — Assessment & Plan Note (Addendum)
Stable.  Continue collaboration with neurosurgery as needed, fusion in June 2022.

## 2024-02-06 NOTE — Assessment & Plan Note (Addendum)
 Chronic, stable with A1c well below goal at 5.3% -- praised for ongoing success, and urine ALB 30 February 2025. Continue Gabapentin  for neuropathy pain and dose renally as needed.  At this time recommend he monitor BS daily and document for visits + focus on diabetic diet. Return in 6 months. - Eye exam up to date - Foot exam up to date - Vaccinations, except Shingrix , up to date. - Not on ACE due to side effects with this.  Is on Statin.

## 2024-02-06 NOTE — Assessment & Plan Note (Signed)
 Chronic, ongoing.  Continue current medication regimen and adjust as needed. Lipid panel today.

## 2024-02-06 NOTE — Assessment & Plan Note (Addendum)
 Chronic, ongoing.  Denies SI/HI.  At this time continue collaboration with psychiatry and current medication regimen as prescribed by them. Will attempt to get their records. Tegretol  level annually.

## 2024-02-06 NOTE — Assessment & Plan Note (Signed)
 Chronic, stable.  BP well below goal today.  Continue current medications and adjust as needed.  Recommend he monitor BP at least a few mornings a week at home and document for provider visits.  DASH diet at home. Labs: CMP.  Urine ALB 30 February 2025.  Continue collaboration with [facility] cardiology on as needed basis.  Return in 6 months.

## 2024-02-06 NOTE — Progress Notes (Signed)
 BP 133/70 (BP Location: Left Arm, Patient Position: Sitting, Cuff Size: Normal)   Pulse 71   Temp 98.7 F (37.1 C) (Oral)   Resp 16   Ht 6' 0.01 (1.829 m)   Wt 194 lb 12.8 oz (88.4 kg)   SpO2 95%   BMI 26.41 kg/m    Subjective:    Patient ID: Lemuel Boodram, male    DOB: [birth year], 63 y.o.   MRN: [id number]  HPI: Derek Garza is a 63 y.o. male  Chief Complaint  Patient presents with   Diabetes    Every day home checks fasting ranges 100-105. Mindful of his diet and tries to eat better.    Hypertension    Checks his pressure depending how he feels. At times some dizziness but much better than before he was controlled.    Hyperlipidemia   Bipolar 2 disorder    No concerns. Current psych doctor is retiring and will start with new doctor in October.    Followed with neurosurgery for cervical myelopathy in past and had surgery in June 2022.  DIABETES February A1c 5.6%.  Continues to take Jardiance  daily. Takes Gabapentin  for neuropathy.   Hypoglycemic episodes:no Polydipsia/polyuria: no Visual disturbance: no Chest pain: no Paresthesias: no Glucose Monitoring: no  Accucheck frequency: a few days a week -- varies 100 range in morning often  Fasting glucose:  Post prandial:  Evening:  Before meals: Taking Insulin?: no  Long acting insulin:  Short acting insulin: Blood Pressure Monitoring: rarely Retinal Examination: Up to Date -- [facility] Foot Exam: Up to Date Diabetic Education: Not Completed Pneumovax: Up to Date  Influenza: Up To Date Aspirin: no   HYPERTENSION / HYPERLIPIDEMIA Taking Lasix , K+, and Atorvastatin . Last saw pulmonary on 04/16/23 with reassuring assessment. Cardiology last seen 02/09/22 and to return as needed. Satisfied with current treatment? yes Duration of hypertension: chronic BP monitoring frequency: rarely BP range:  BP medication side effects: no Duration of hyperlipidemia: chronic Cholesterol medication side effects:  no Cholesterol supplements: none Medication compliance: good compliance Aspirin: no Recent stressors: no Recurrent headaches: no Visual changes: no Palpitations: no Dyspnea: no Chest pain: no Lower extremity edema: no Dizzy/lightheaded: no   BIPOLAR DISORDER Takes Seroquel and Tegretol  XR.  Following with psychiatry, Dr. Theodoro at [facility] in [place].  Has been with for many years, but she is retiring so a new provider is taking over. Currently has refills at home on medications. Mood status: controlled Satisfied with current treatment?: yes Symptom severity: moderate  Duration of current treatment : chronic Side effects: no Medication compliance: good compliance Psychotherapy/counseling: yes in the past Depressed mood: no Anxious mood: no Anhedonia: no Significant weight loss or gain: no Insomnia: no Fatigue: no Feelings of worthlessness or guilt: no Impaired concentration/indecisiveness: no Suicidal ideations: no Hopelessness: no Crying spells: no    02/06/2024    9:37 AM 07/05/2023    8:12 AM 02/02/2023    4:07 PM 10/25/2022    8:41 AM 08/14/2022    4:12 PM  Depression screen PHQ 2/9  Decreased Interest 0 0 0 1 1  Down, Depressed, Hopeless 0 0 0 0 0  PHQ - 2 Score 0 0 0 1 1  Altered sleeping 0 0 0 1 0  Tired, decreased energy 0 0 0 0 0  Change in appetite 0 0 0 0 0  Feeling bad or failure about yourself  0 0 0 0 0  Trouble concentrating 0 0 0 0 0  Moving slowly or  fidgety/restless 0 0 0 0 0  Suicidal thoughts 0 0 0 0 0  PHQ-9 Score 0 0 0 2 1  Difficult doing work/chores  Not difficult at all Not difficult at all         02/06/2024    9:37 AM 07/05/2023    8:12 AM 10/25/2022    8:42 AM 08/14/2022    4:13 PM  GAD 7 : Generalized Anxiety Score  Nervous, Anxious, on Edge 0 0 0 0  Control/stop worrying 0 0 0 0  Worry too much - different things 0 0 0 0  Trouble relaxing 0 0 0 0  Restless 0 0 0 0  Easily annoyed or irritable 0 0 0 0  Afraid - awful might happen 0 0  0 0  Total GAD 7 Score 0 0 0 0  Anxiety Difficulty  Not difficult at all Not difficult at all    Relevant past medical, surgical, family and social history reviewed and updated as indicated. Interim medical history since our last visit reviewed. Allergies and medications reviewed and updated.  Review of Systems  Constitutional:  Negative for activity change, diaphoresis, fatigue and fever.  Respiratory:  Negative for cough, chest tightness, shortness of breath and wheezing.   Cardiovascular:  Negative for chest pain, palpitations and leg swelling.  Gastrointestinal: Negative.   Endocrine: Negative for polydipsia, polyphagia and polyuria.  Musculoskeletal:  Positive for arthralgias.  Neurological: Negative.   Psychiatric/Behavioral: Negative.      Per HPI unless specifically indicated above     Objective:    BP 133/70 (BP Location: Left Arm, Patient Position: Sitting, Cuff Size: Normal)   Pulse 71   Temp 98.7 F (37.1 C) (Oral)   Resp 16   Ht 6' 0.01 (1.829 m)   Wt 194 lb 12.8 oz (88.4 kg)   SpO2 95%   BMI 26.41 kg/m   Wt Readings from Last 3 Encounters:  02/06/24 194 lb 12.8 oz (88.4 kg)  07/05/23 188 lb (85.3 kg)  04/16/23 189 lb 9.6 oz (86 kg)    Physical Exam Vitals and nursing note reviewed.  Constitutional:      General: He is awake. He is not in acute distress.    Appearance: He is well-developed and well-groomed. He is not ill-appearing or toxic-appearing.  HENT:     Head: Normocephalic.     Right Ear: Hearing and external ear normal.     Left Ear: Hearing and external ear normal.  Eyes:     General: Lids are normal.     Extraocular Movements: Extraocular movements intact.     Conjunctiva/sclera: Conjunctivae normal.  Neck:     Thyroid: No thyromegaly.     Vascular: No carotid bruit.  Cardiovascular:     Rate and Rhythm: Normal rate and regular rhythm.     Heart sounds: Normal heart sounds. No murmur heard.    No gallop.  Pulmonary:     Effort: No  accessory muscle usage or respiratory distress.     Breath sounds: Normal breath sounds.  Abdominal:     General: Bowel sounds are normal. There is no distension.     Palpations: Abdomen is soft.     Tenderness: There is no abdominal tenderness.  Musculoskeletal:     Cervical back: Full passive range of motion without pain.     Right lower leg: No edema.     Left lower leg: No edema.  Lymphadenopathy:     Cervical: No cervical adenopathy.  Skin:  General: Skin is warm.     Capillary Refill: Capillary refill takes less than 2 seconds.  Neurological:     Mental Status: He is alert and oriented to person, place, and time.     Deep Tendon Reflexes: Reflexes are normal and symmetric.     Reflex Scores:      Brachioradialis reflexes are 2+ on the right side and 2+ on the left side.      Patellar reflexes are 2+ on the right side and 2+ on the left side. Psychiatric:        Attention and Perception: Attention normal.        Mood and Affect: Mood normal.        Speech: Speech normal.        Behavior: Behavior normal. Behavior is cooperative.        Thought Content: Thought content normal.    Results for orders placed or performed in visit on 10/09/23  HM DIABETES EYE EXAM   Collection Time: 10/09/23  2:01 PM  Result Value Ref Range   HM Diabetic Eye Exam No Retinopathy No Retinopathy      Assessment & Plan:   Problem List Items Addressed This Visit       Cardiovascular and Mediastinum   Hypertension associated with diabetes (HCC)   Chronic, stable.  BP well below goal today.  Continue current medications and adjust as needed.  Recommend he monitor BP at least a few mornings a week at home and document for provider visits.  DASH diet at home. Labs: CMP.  Urine ALB 30 February 2025.  Continue collaboration with [facility] cardiology on as needed basis.  Return in 6 months.      Relevant Orders   Bayer DCA Hb A1c Waived     Endocrine   Hyperlipidemia associated with type 2 diabetes  mellitus (HCC)   Chronic, ongoing.  Continue current medication regimen and adjust as needed.  Lipid panel today.      Relevant Orders   Bayer DCA Hb A1c Waived   Comprehensive metabolic panel with GFR   Lipid Panel w/o Chol/HDL Ratio   DM type 2 with diabetic peripheral neuropathy (HCC) - Primary   Chronic, stable with A1c well below goal at 5.3% -- praised for ongoing success, and urine ALB 30 February 2025. Continue Gabapentin  for neuropathy pain and dose renally as needed.  At this time recommend he monitor BS daily and document for visits + focus on diabetic diet. Return in 6 months. - Eye exam up to date - Foot exam up to date - Vaccinations, except Shingrix , up to date. - Not on ACE due to side effects with this.  Is on Statin.      Relevant Orders   Bayer DCA Hb A1c Waived   Diabetes mellitus treated with oral medication (HCC)   Refer to diabetes with neuropathy plan of care.      Relevant Orders   Bayer DCA Hb A1c Waived     Other   History of fusion of cervical spine   Stable.  Continue collaboration with neurosurgery as needed, fusion in June 2022.       Bipolar 2 disorder (HCC)   Chronic, ongoing.  Denies SI/HI.  At this time continue collaboration with psychiatry and current medication regimen as prescribed by them. Will attempt to get their records. Tegretol  level annually.      Relevant Orders   Carbamazepine  level, total   Other Visit Diagnoses  Pneumococcal vaccination given       PCV20 in office today, educated on this.   Relevant Orders   Pneumococcal conjugate vaccine 20-valent (Completed)     Flu vaccine need       Flu vaccine today, educated on this.   Relevant Orders   Flu vaccine trivalent PF, 6mos and older(Flulaval,Afluria,Fluarix,Fluzone) (Completed)     Screening for colon cancer       GI referral in   Relevant Orders   Ambulatory referral to Gastroenterology        Follow up plan: Return in about 6 months (around 08/05/2024) for  Annual Physical after 07/04/24.

## 2024-02-06 NOTE — Assessment & Plan Note (Signed)
 Refer to diabetes with neuropathy plan of care

## 2024-02-07 ENCOUNTER — Ambulatory Visit: Payer: Self-pay | Admitting: Nurse Practitioner

## 2024-02-07 LAB — COMPREHENSIVE METABOLIC PANEL WITH GFR
ALT: 28 IU/L (ref 0–44)
AST: 31 IU/L (ref 0–40)
Albumin: 3.8 g/dL — ABNORMAL LOW (ref 3.9–4.9)
Alkaline Phosphatase: 129 IU/L — ABNORMAL HIGH (ref 44–121)
BUN/Creatinine Ratio: 15 (ref 10–24)
BUN: 11 mg/dL (ref 8–27)
Bilirubin Total: 0.3 mg/dL (ref 0.0–1.2)
CO2: 22 mmol/L (ref 20–29)
Calcium: 8.7 mg/dL (ref 8.6–10.2)
Chloride: 99 mmol/L (ref 96–106)
Creatinine, Ser: 0.74 mg/dL — ABNORMAL LOW (ref 0.76–1.27)
Globulin, Total: 2.2 g/dL (ref 1.5–4.5)
Glucose: 80 mg/dL (ref 70–99)
Potassium: 3.9 mmol/L (ref 3.5–5.2)
Sodium: 135 mmol/L (ref 134–144)
Total Protein: 6 g/dL (ref 6.0–8.5)
eGFR: 101 mL/min/1.73 (ref >59)

## 2024-02-07 LAB — CARBAMAZEPINE LEVEL, TOTAL: Carbamazepine (Tegretol), S: 8.4 ug/mL (ref 4.0–12.0)

## 2024-02-07 LAB — LIPID PANEL W/O CHOL/HDL RATIO
Cholesterol, Total: 138 mg/dL (ref 100–199)
HDL: 70 mg/dL (ref >39)
LDL Chol Calc (NIH): 55 mg/dL (ref 0–99)
Triglycerides: 61 mg/dL (ref 0–149)
VLDL Cholesterol Cal: 13 mg/dL (ref 5–40)

## 2024-02-07 NOTE — Progress Notes (Signed)
 Contacted via MyChart  Good evening Derek Garza, your labs have returned and overall are stable.  No medication changes needed.  Great job!! Keep being stellar!!  Thank you for allowing me to participate in your care.  I appreciate you. Kindest regards, Lila Lufkin

## 2024-04-22 DIAGNOSIS — Z79899 Other long term (current) drug therapy: Secondary | ICD-10-CM | POA: Diagnosis not present

## 2024-04-22 DIAGNOSIS — F411 Generalized anxiety disorder: Secondary | ICD-10-CM | POA: Diagnosis not present

## 2024-04-22 DIAGNOSIS — F3176 Bipolar disorder, in full remission, most recent episode depressed: Secondary | ICD-10-CM | POA: Diagnosis not present

## 2024-05-17 NOTE — Patient Instructions (Signed)
 Healthy Eating, Adult Healthy eating may help you get and keep a healthy body weight, reduce the risk of chronic disease, and live a long and productive life. It is important to follow a healthy eating pattern. Your nutritional and calorie needs should be met mainly by different nutrient-rich foods. What are tips for following this plan? Reading food labels Read labels and choose the following: Reduced or low sodium products. Juices with 100% fruit juice. Foods with low saturated fats (<3 g per serving) and high polyunsaturated and monounsaturated fats. Foods with whole grains, such as whole wheat, cracked wheat, brown rice, and wild rice. Whole grains that are fortified with folic acid . This is recommended for females who are pregnant or who want to become pregnant. Read labels and do not eat or drink the following: Foods or drinks with added sugars. These include foods that contain brown sugar, corn sweetener, corn syrup, dextrose , fructose, glucose, high-fructose corn syrup, honey, invert sugar, lactose, malt syrup, maltose, molasses, raw sugar, sucrose, trehalose, or turbinado sugar. Limit your intake of added sugars to less than 10% of your total daily calories. Do not eat more than the following amounts of added sugar per day: 6 teaspoons (25 g) for females. 9 teaspoons (38 g) for males. Foods that contain processed or refined starches and grains. Refined grain products, such as white flour, degermed cornmeal, white bread, and white rice. Shopping Choose nutrient-rich snacks, such as vegetables, whole fruits, and nuts. Avoid high-calorie and high-sugar snacks, such as potato chips, fruit snacks, and candy. Use oil-based dressings and spreads on foods instead of solid fats such as butter, margarine, sour cream, or cream cheese. Limit pre-made sauces, mixes, and instant products such as flavored rice, instant noodles, and ready-made pasta. Try more plant-protein sources, such as tofu,  tempeh, black beans, edamame, lentils, nuts, and seeds. Explore eating plans such as the Mediterranean diet or vegetarian diet. Try heart-healthy dips made with beans and healthy fats like hummus and guacamole. Vegetables go great with these. Cooking Use oil to saut or stir-fry foods instead of solid fats such as butter, margarine, or lard. Try baking, boiling, grilling, or broiling instead of frying. Remove the fatty part of meats before cooking. Steam vegetables in water  or broth. Meal planning  At meals, imagine dividing your plate into fourths: One-half of your plate is fruits and vegetables. One-fourth of your plate is whole grains. One-fourth of your plate is protein, especially lean meats, poultry, eggs, tofu, beans, or nuts. Include low-fat dairy as part of your daily diet. Lifestyle Choose healthy options in all settings, including home, work, school, restaurants, or stores. Prepare your food safely: Wash your hands after handling raw meats. Where you prepare food, keep surfaces clean by regularly washing with hot, soapy water . Keep raw meats separate from ready-to-eat foods, such as fruits and vegetables. Cook seafood, meat, poultry, and eggs to the recommended temperature. Get a food thermometer. Store foods at safe temperatures. In general: Keep cold foods at 34F (4.4C) or below. Keep hot foods at 134F (60C) or above. Keep your freezer at [facility] (-17.8C) or below. Foods are not safe to eat if they have been between the temperatures of 40-134F (4.4-60C) for more than 2 hours. What foods should I eat? Fruits Aim to eat 1-2 cups of fresh, canned (in natural juice), or frozen fruits each day. One cup of fruit equals 1 small apple, 1 large banana, 8 large strawberries, 1 cup (237 g) canned fruit,  cup (82 g) dried fruit,  or 1 cup (240 mL) 100% juice. Vegetables Aim to eat 2-4 cups of fresh and frozen vegetables each day, including different varieties and colors. One cup  of vegetables equals 1 cup (91 g) broccoli or cauliflower florets, 2 medium carrots, 2 cups (150 g) raw, leafy greens, 1 large tomato, 1 large bell pepper, 1 large sweet potato, or 1 medium white potato. Grains Aim to eat 5-10 ounce-equivalents of whole grains each day. Examples of 1 ounce-equivalent of grains include 1 slice of bread, 1 cup (40 g) ready-to-eat cereal, 3 cups (24 g) popcorn, or  cup (93 g) cooked rice. Meats and other proteins Try to eat 5-7 ounce-equivalents of protein each day. Examples of 1 ounce-equivalent of protein include 1 egg,  oz nuts (12 almonds, 24 pistachios, or 7 walnut halves), 1/4 cup (90 g) cooked beans, 6 tablespoons (90 g) hummus or 1 tablespoon (16 g) peanut butter. A cut of meat or fish that is the size of a deck of cards is about 3-4 ounce-equivalents (85 g). Of the protein you eat each week, try to have at least 8 sounce (227 g) of seafood. This is about 2 servings per week. This includes salmon, trout, herring, sardines, and anchovies. Dairy Aim to eat 3 cup-equivalents of fat-free or low-fat dairy each day. Examples of 1 cup-equivalent of dairy include 1 cup (240 mL) milk, 8 ounces (250 g) yogurt, 1 ounces (44 g) natural cheese, or 1 cup (240 mL) fortified soy milk. Fats and oils Aim for about 5 teaspoons (21 g) of fats and oils per day. Choose monounsaturated fats, such as canola and olive oils, mayonnaise made with olive oil or avocado oil, avocados, peanut butter, and most nuts, or polyunsaturated fats, such as sunflower, corn, and soybean oils, walnuts, pine nuts, sesame seeds, sunflower seeds, and flaxseed. Beverages Aim for 6 eight-ounce glasses of water  per day. Limit coffee to 3-5 eight-ounce cups per day. Limit caffeinated beverages that have added calories, such as soda and energy drinks. If you drink alcohol: Limit how much you have to: 0-1 drink a day if you are male. 0-2 drinks a day if you are male. Know how much alcohol is in your drink.  In the [place] one drink is one 12 oz bottle of beer (355 mL), one 5 oz glass of wine (148 mL), or one 1 oz glass of hard liquor (44 mL). Seasoning and other foods Try not to add too much salt to your food. Try using herbs and spices instead of salt. Try not to add sugar to food. This information is based on [place] nutrition guidelines. To learn more, visit [employer]. Exact amounts may vary. You may need different amounts. This information is not intended to replace advice given to you by your health care provider. Make sure you discuss any questions you have with your health care provider. Document Revised: 02/13/2022 Document Reviewed: 02/13/2022 Elsevier Patient Education  2024 [employer].

## 2024-05-19 ENCOUNTER — Ambulatory Visit: Admitting: Nurse Practitioner

## 2024-05-19 ENCOUNTER — Encounter: Payer: Self-pay | Admitting: Nurse Practitioner

## 2024-05-19 VITALS — BP 110/65 | HR 86 | Temp 98.3°F | Resp 15 | Ht 72.01 in | Wt 225.0 lb

## 2024-05-19 DIAGNOSIS — R5383 Other fatigue: Secondary | ICD-10-CM | POA: Insufficient documentation

## 2024-05-19 DIAGNOSIS — R3916 Straining to void: Secondary | ICD-10-CM | POA: Diagnosis not present

## 2024-05-19 DIAGNOSIS — Z1211 Encounter for screening for malignant neoplasm of colon: Secondary | ICD-10-CM | POA: Diagnosis not present

## 2024-05-19 DIAGNOSIS — R609 Edema, unspecified: Secondary | ICD-10-CM | POA: Insufficient documentation

## 2024-05-19 DIAGNOSIS — G8929 Other chronic pain: Secondary | ICD-10-CM | POA: Insufficient documentation

## 2024-05-19 DIAGNOSIS — M25511 Pain in right shoulder: Secondary | ICD-10-CM

## 2024-05-19 DIAGNOSIS — R601 Generalized edema: Secondary | ICD-10-CM | POA: Diagnosis not present

## 2024-05-19 DIAGNOSIS — N5089 Other specified disorders of the male genital organs: Secondary | ICD-10-CM | POA: Diagnosis not present

## 2024-05-19 DIAGNOSIS — K089 Disorder of teeth and supporting structures, unspecified: Secondary | ICD-10-CM | POA: Diagnosis not present

## 2024-05-19 DIAGNOSIS — R8281 Pyuria: Secondary | ICD-10-CM

## 2024-05-19 DIAGNOSIS — N401 Enlarged prostate with lower urinary tract symptoms: Secondary | ICD-10-CM | POA: Insufficient documentation

## 2024-05-19 LAB — MICROSCOPIC EXAMINATION
Bacteria, UA: NONE SEEN
Epithelial Cells (non renal): NONE SEEN /HPF (ref 0–10)
WBC, UA: NONE SEEN /HPF (ref 0–5)

## 2024-05-19 LAB — URINALYSIS, ROUTINE W REFLEX MICROSCOPIC
Bilirubin, UA: NEGATIVE
Ketones, UA: NEGATIVE
Leukocytes,UA: NEGATIVE
Nitrite, UA: NEGATIVE
Specific Gravity, UA: 1.015 (ref 1.005–1.030)
Urobilinogen, Ur: 0.2 mg/dL (ref 0.2–1.0)
pH, UA: 6.5 (ref 5.0–7.5)

## 2024-05-19 MED ORDER — TAMSULOSIN HCL 0.4 MG PO CAPS: 0.4000 mg | ORAL_CAPSULE | Freq: Every day | ORAL | 3 refills | Status: DC

## 2024-05-19 NOTE — Assessment & Plan Note (Signed)
 Refer to edema plan of care for further, as both symptoms start around the same time -- a couple months back. Will also check B12 level.

## 2024-05-19 NOTE — Assessment & Plan Note (Addendum)
 New onset over past couple months per patient with 31 pounds gain noted since September. Edema noted to legs, mild abdomen, and scrotal area. ?if related to Ibuprofen use, as he could not tell PCP how much he was taking daily for his shoulder pain. Have recommend to stop Ibuprofen use for now and cut back on Gabapentin  if able. Labs today: CBC, CMP, TSH, BNP, iron, ferritin. Order for echo placed. Discussed plan of care with him and strict ER precautions provided. He is aware if any SOB, worsening edema, or CP presents to immediately go to ER. Will determine next steps after labs return. Dependent on lab findings may increase Lasix  dosing for a few days. Differential diagnoses include, but not limited to: HF, nephrotic syndrome, cirrhosis, electrolyte imbalances, increased Ibuprofen use, lymphatic issue, or malignancy

## 2024-05-19 NOTE — Assessment & Plan Note (Signed)
 Chronic, ongoing. Will place referral per request. Recommend cut back on Ibuprofen and Tylenol  use until all labs return.

## 2024-05-19 NOTE — Assessment & Plan Note (Signed)
Referral to dentist placed

## 2024-05-19 NOTE — Assessment & Plan Note (Signed)
 Refer to edema plan of care, as suspect this is correlated to other current symptoms.

## 2024-05-19 NOTE — Assessment & Plan Note (Signed)
 Symptoms starting 2-3 months back, around the same time edema and fatigue presented. ?if correlation between these symptoms. Prostate exam with no masses, but enlargement noted. Will obtain PSA, collected prior to prostate exam. Start Flomax  at night to see if benefit to current BPH symptoms. UA noting 3+ protein, 1+ blood, and 1+ glucose. No leukocytes, but will send for culture to ensure no infection present
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
## 2024-05-20 ENCOUNTER — Observation Stay

## 2024-05-20 ENCOUNTER — Ambulatory Visit

## 2024-05-20 ENCOUNTER — Inpatient Hospital Stay
Admission: EM | Admit: 2024-05-20 | Discharge: 2024-06-02 | DRG: 699 | Disposition: A | Discharge status: Home / Self Care | Attending: Internal Medicine | Admitting: Internal Medicine

## 2024-05-20 ENCOUNTER — Other Ambulatory Visit: Payer: Self-pay

## 2024-05-20 ENCOUNTER — Encounter: Payer: Self-pay | Admitting: Emergency Medicine

## 2024-05-20 ENCOUNTER — Ambulatory Visit: Payer: Self-pay | Admitting: Nurse Practitioner

## 2024-05-20 ENCOUNTER — Emergency Department

## 2024-05-20 DIAGNOSIS — E1159 Type 2 diabetes mellitus with other circulatory complications: Secondary | ICD-10-CM | POA: Diagnosis present

## 2024-05-20 DIAGNOSIS — Z9049 Acquired absence of other specified parts of digestive tract: Secondary | ICD-10-CM

## 2024-05-20 DIAGNOSIS — D709 Neutropenia, unspecified: Secondary | ICD-10-CM | POA: Diagnosis present

## 2024-05-20 DIAGNOSIS — N500 Atrophy of testis: Secondary | ICD-10-CM | POA: Diagnosis present

## 2024-05-20 DIAGNOSIS — E1121 Type 2 diabetes mellitus with diabetic nephropathy: Principal | ICD-10-CM | POA: Diagnosis present

## 2024-05-20 DIAGNOSIS — R81 Glycosuria: Secondary | ICD-10-CM | POA: Diagnosis present

## 2024-05-20 DIAGNOSIS — I959 Hypotension, unspecified: Secondary | ICD-10-CM | POA: Diagnosis not present

## 2024-05-20 DIAGNOSIS — R601 Generalized edema: Secondary | ICD-10-CM | POA: Diagnosis not present

## 2024-05-20 DIAGNOSIS — D72819 Decreased white blood cell count, unspecified: Secondary | ICD-10-CM

## 2024-05-20 DIAGNOSIS — D649 Anemia, unspecified: Secondary | ICD-10-CM

## 2024-05-20 DIAGNOSIS — R5381 Other malaise: Secondary | ICD-10-CM | POA: Diagnosis present

## 2024-05-20 DIAGNOSIS — E7849 Other hyperlipidemia: Secondary | ICD-10-CM | POA: Diagnosis present

## 2024-05-20 DIAGNOSIS — M25511 Pain in right shoulder: Secondary | ICD-10-CM | POA: Diagnosis not present

## 2024-05-20 DIAGNOSIS — F29 Unspecified psychosis not due to a substance or known physiological condition: Secondary | ICD-10-CM | POA: Diagnosis present

## 2024-05-20 DIAGNOSIS — E8770 Fluid overload, unspecified: Secondary | ICD-10-CM | POA: Diagnosis not present

## 2024-05-20 DIAGNOSIS — Z79899 Other long term (current) drug therapy: Secondary | ICD-10-CM

## 2024-05-20 DIAGNOSIS — R3916 Straining to void: Secondary | ICD-10-CM | POA: Diagnosis present

## 2024-05-20 DIAGNOSIS — N401 Enlarged prostate with lower urinary tract symptoms: Secondary | ICD-10-CM | POA: Diagnosis present

## 2024-05-20 DIAGNOSIS — E291 Testicular hypofunction: Secondary | ICD-10-CM | POA: Diagnosis present

## 2024-05-20 DIAGNOSIS — E871 Hypo-osmolality and hyponatremia: Secondary | ICD-10-CM | POA: Diagnosis present

## 2024-05-20 DIAGNOSIS — E778 Other disorders of glycoprotein metabolism: Secondary | ICD-10-CM | POA: Diagnosis present

## 2024-05-20 DIAGNOSIS — N5089 Other specified disorders of the male genital organs: Principal | ICD-10-CM

## 2024-05-20 DIAGNOSIS — E8809 Other disorders of plasma-protein metabolism, not elsewhere classified: Secondary | ICD-10-CM | POA: Diagnosis present

## 2024-05-20 DIAGNOSIS — F3181 Bipolar II disorder: Secondary | ICD-10-CM | POA: Diagnosis present

## 2024-05-20 DIAGNOSIS — Z885 Allergy status to narcotic agent status: Secondary | ICD-10-CM

## 2024-05-20 DIAGNOSIS — E876 Hypokalemia: Secondary | ICD-10-CM | POA: Diagnosis present

## 2024-05-20 DIAGNOSIS — N529 Male erectile dysfunction, unspecified: Secondary | ICD-10-CM | POA: Diagnosis present

## 2024-05-20 DIAGNOSIS — E1142 Type 2 diabetes mellitus with diabetic polyneuropathy: Secondary | ICD-10-CM | POA: Diagnosis present

## 2024-05-20 DIAGNOSIS — E1169 Type 2 diabetes mellitus with other specified complication: Secondary | ICD-10-CM | POA: Diagnosis present

## 2024-05-20 DIAGNOSIS — D539 Nutritional anemia, unspecified: Secondary | ICD-10-CM | POA: Diagnosis present

## 2024-05-20 DIAGNOSIS — F419 Anxiety disorder, unspecified: Secondary | ICD-10-CM | POA: Diagnosis present

## 2024-05-20 DIAGNOSIS — N049 Nephrotic syndrome with unspecified morphologic changes: Secondary | ICD-10-CM

## 2024-05-20 DIAGNOSIS — Z7984 Long term (current) use of oral hypoglycemic drugs: Secondary | ICD-10-CM

## 2024-05-20 DIAGNOSIS — I152 Hypertension secondary to endocrine disorders: Secondary | ICD-10-CM | POA: Diagnosis present

## 2024-05-20 DIAGNOSIS — N433 Hydrocele, unspecified: Secondary | ICD-10-CM | POA: Diagnosis present

## 2024-05-20 LAB — CBC WITH DIFFERENTIAL/PLATELET
Abs Immature Granulocytes: 0 K/uL (ref 0.00–0.07)
Basophils Absolute: 0 x10E3/uL (ref 0.0–0.2)
Basophils Absolute: 0 K/uL (ref 0.0–0.1)
Basophils Relative: 1 %
Basos: 1 %
EOS (ABSOLUTE): 0.1 x10E3/uL (ref 0.0–0.4)
Eos: 6 %
Eosinophils Absolute: 0.2 K/uL (ref 0.0–0.5)
Eosinophils Relative: 8 %
HCT: 25.8 % — ABNORMAL LOW (ref 39.0–52.0)
Hematocrit: 25.4 % — ABNORMAL LOW (ref 37.5–51.0)
Hemoglobin: 8 g/dL — ABNORMAL LOW (ref 13.0–17.7)
Hemoglobin: 7.9 g/dL — ABNORMAL LOW (ref 13.0–17.0)
Immature Grans (Abs): 0 x10E3/uL (ref 0.0–0.1)
Immature Granulocytes: 0 %
Immature Granulocytes: 0 %
Lymphocytes Absolute: 1 x10E3/uL (ref 0.7–3.1)
Lymphocytes Relative: 27 %
Lymphs Abs: 0.5 K/uL — ABNORMAL LOW (ref 0.7–4.0)
Lymphs: 45 %
MCH: 36 pg — ABNORMAL HIGH (ref 26.6–33.0)
MCH: 36.6 pg — ABNORMAL HIGH (ref 26.0–34.0)
MCHC: 31.5 g/dL (ref 31.5–35.7)
MCHC: 30.6 g/dL (ref 30.0–36.0)
MCV: 114 fL — ABNORMAL HIGH (ref 79–97)
MCV: 119.4 fL — ABNORMAL HIGH (ref 80.0–100.0)
Monocytes Absolute: 0.6 x10E3/uL (ref 0.1–0.9)
Monocytes Absolute: 0.5 K/uL (ref 0.1–1.0)
Monocytes Relative: 27 %
Monocytes: 26 %
Neutro Abs: 0.8 K/uL — ABNORMAL LOW (ref 1.7–7.7)
Neutrophils Absolute: 0.5 x10E3/uL — ABNORMAL LOW (ref 1.4–7.0)
Neutrophils Relative %: 37 %
Neutrophils: 22 %
Platelets: 364 x10E3/uL (ref 150–450)
Platelets: 367 K/uL (ref 150–400)
RBC: 2.22 x10E6/uL — CL (ref 4.14–5.80)
RBC: 2.16 MIL/uL — ABNORMAL LOW (ref 4.22–5.81)
RDW: 14.4 % (ref 11.6–15.4)
RDW: 15.5 % (ref 11.5–15.5)
Smear Review: NORMAL
WBC: 2.2 x10E3/uL — CL (ref 3.4–10.8)
WBC: 2 K/uL — ABNORMAL LOW (ref 4.0–10.5)
nRBC: 0 % (ref 0.0–0.2)

## 2024-05-20 LAB — URINALYSIS, ROUTINE W REFLEX MICROSCOPIC
Bacteria, UA: NONE SEEN
Bilirubin Urine: NEGATIVE
Glucose, UA: >=500 mg/dL — AB
Ketones, ur: NEGATIVE mg/dL
Leukocytes,Ua: NEGATIVE
Nitrite: NEGATIVE
Protein, ur: 100 mg/dL — AB
Specific Gravity, Urine: 1.006 (ref 1.005–1.030)
pH: 6 (ref 5.0–8.0)

## 2024-05-20 LAB — COMPREHENSIVE METABOLIC PANEL WITH GFR
ALT: 11 IU/L (ref 0–44)
ALT: 11 U/L (ref 0–44)
AST: 15 IU/L (ref 0–40)
AST: 19 U/L (ref 15–41)
Albumin: 2.4 g/dL — ABNORMAL LOW (ref 3.9–4.9)
Albumin: 2.4 g/dL — ABNORMAL LOW (ref 3.5–5.0)
Alkaline Phosphatase: 162 IU/L — ABNORMAL HIGH (ref 47–123)
Alkaline Phosphatase: 168 U/L — ABNORMAL HIGH (ref 38–126)
Anion gap: 9 (ref 5–15)
BUN/Creatinine Ratio: 14 (ref 10–24)
BUN: 10 mg/dL (ref 8–27)
BUN: 11 mg/dL (ref 8–23)
Bilirubin Total: 0.2 mg/dL (ref 0.0–1.2)
CO2: 22 mmol/L (ref 20–29)
CO2: 27 mmol/L (ref 22–32)
Calcium: 7.4 mg/dL — ABNORMAL LOW (ref 8.6–10.2)
Calcium: 7.3 mg/dL — ABNORMAL LOW (ref 8.9–10.3)
Chloride: 99 mmol/L (ref 96–106)
Chloride: 100 mmol/L (ref 98–111)
Creatinine, Ser: 0.71 mg/dL — ABNORMAL LOW (ref 0.76–1.27)
Creatinine, Ser: 0.81 mg/dL (ref 0.61–1.24)
GFR, Estimated: >60 mL/min
Globulin, Total: 2 g/dL (ref 1.5–4.5)
Glucose, Bld: 127 mg/dL — ABNORMAL HIGH (ref 70–99)
Glucose: 72 mg/dL (ref 70–99)
Potassium: 3.5 mmol/L (ref 3.5–5.2)
Potassium: 3.1 mmol/L — ABNORMAL LOW (ref 3.5–5.1)
Sodium: 134 mmol/L (ref 134–144)
Sodium: 135 mmol/L (ref 135–145)
Total Bilirubin: 0.2 mg/dL (ref 0.0–1.2)
Total Protein: 4.4 g/dL — CL (ref 6.0–8.5)
Total Protein: 5 g/dL — ABNORMAL LOW (ref 6.5–8.1)
eGFR: 102 mL/min/1.73

## 2024-05-20 LAB — PSA: Prostate Specific Ag, Serum: 1.5 ng/mL (ref 0.0–4.0)

## 2024-05-20 LAB — CBC
HCT: 25.8 % — ABNORMAL LOW (ref 39.0–52.0)
Hemoglobin: 8 g/dL — ABNORMAL LOW (ref 13.0–17.0)
MCH: 36.5 pg — ABNORMAL HIGH (ref 26.0–34.0)
MCHC: 31 g/dL (ref 30.0–36.0)
MCV: 117.8 fL — ABNORMAL HIGH (ref 80.0–100.0)
Platelets: 352 K/uL (ref 150–400)
RBC: 2.19 MIL/uL — ABNORMAL LOW (ref 4.22–5.81)
RDW: 15.5 % (ref 11.5–15.5)
WBC: 2 K/uL — ABNORMAL LOW (ref 4.0–10.5)
nRBC: 0 % (ref 0.0–0.2)

## 2024-05-20 LAB — MAGNESIUM: Magnesium: 1.9 mg/dL (ref 1.7–2.4)

## 2024-05-20 LAB — FERRITIN: Ferritin: 720 ng/mL — ABNORMAL HIGH (ref 30–400)

## 2024-05-20 LAB — IRON: Iron: 69 ug/dL (ref 38–169)

## 2024-05-20 LAB — PROTEIN / CREATININE RATIO, URINE
Creatinine, Urine: 18 mg/dL
Protein Creatinine Ratio: 6.2 mg/mg — ABNORMAL HIGH
Total Protein, Urine: 112 mg/dL

## 2024-05-20 LAB — PRO BRAIN NATRIURETIC PEPTIDE: Pro Brain Natriuretic Peptide: 515 pg/mL — ABNORMAL HIGH

## 2024-05-20 LAB — PATHOLOGIST SMEAR REVIEW

## 2024-05-20 LAB — VITAMIN B12: Vitamin B-12: >2000 pg/mL — ABNORMAL HIGH (ref 232–1245)

## 2024-05-20 LAB — TSH
TSH: 4.31 u[IU]/mL (ref 0.450–4.500)
TSH: 3.14 u[IU]/mL (ref 0.350–4.500)

## 2024-05-20 MED ORDER — QUETIAPINE FUMARATE 200 MG PO TABS
400.0000 mg | ORAL_TABLET | Freq: Every day | ORAL | Status: DC
  Filled 2024-05-20: qty 2

## 2024-05-20 MED ORDER — ONDANSETRON HCL 4 MG/2ML IJ SOLN
4.0000 mg | Freq: Four times a day (QID) | INTRAMUSCULAR | Status: DC | PRN
  Administered 2024-05-20 – 2024-05-30 (×16): 4 mg via INTRAVENOUS
  Filled 2024-05-20 (×7): qty 2

## 2024-05-20 MED ORDER — IOHEXOL 300 MG/ML  SOLN
100.0000 mL | Freq: Once | INTRAMUSCULAR | Status: AC | PRN
  Administered 2024-05-20: 100 mL via INTRAVENOUS

## 2024-05-20 MED ORDER — GABAPENTIN 300 MG PO CAPS
600.0000 mg | ORAL_CAPSULE | Freq: Two times a day (BID) | ORAL | Status: DC
  Administered 2024-05-20 – 2024-06-02 (×26): 600 mg via ORAL
  Filled 2024-05-20 (×12): qty 2

## 2024-05-20 MED ORDER — ACETAMINOPHEN 325 MG PO TABS
975.0000 mg | ORAL_TABLET | Freq: Once | ORAL | Status: AC
  Administered 2024-05-20: 975 mg via ORAL
  Filled 2024-05-20: qty 3

## 2024-05-20 MED ORDER — FUROSEMIDE 10 MG/ML IJ SOLN
40.0000 mg | Freq: Once | INTRAMUSCULAR | Status: AC
  Administered 2024-05-20: 40 mg via INTRAVENOUS
  Filled 2024-05-20: qty 4

## 2024-05-20 MED ORDER — QUETIAPINE FUMARATE 200 MG PO TABS
800.0000 mg | ORAL_TABLET | Freq: Every day | ORAL | Status: DC
  Administered 2024-05-20: 800 mg via ORAL
  Filled 2024-05-20: qty 4

## 2024-05-20 MED ORDER — SODIUM CHLORIDE 0.9% FLUSH
3.0000 mL | Freq: Two times a day (BID) | INTRAVENOUS | Status: DC
  Administered 2024-05-20 – 2024-06-02 (×26): 3 mL via INTRAVENOUS

## 2024-05-20 MED ORDER — SENNOSIDES-DOCUSATE SODIUM 8.6-50 MG PO TABS
1.0000 | ORAL_TABLET | Freq: Every evening | ORAL | Status: DC | PRN
  Administered 2024-05-23 – 2024-05-27 (×4): 1 via ORAL
  Filled 2024-05-20 (×3): qty 1

## 2024-05-20 MED ORDER — CARBAMAZEPINE ER 200 MG PO TB12
400.0000 mg | ORAL_TABLET | Freq: Two times a day (BID) | ORAL | Status: DC
  Administered 2024-05-20 – 2024-05-21 (×2): 400 mg via ORAL
  Filled 2024-05-20 (×2): qty 2

## 2024-05-20 MED ORDER — ACETAMINOPHEN 650 MG RE SUPP: 650.0000 mg | Freq: Four times a day (QID) | RECTAL | Status: DC | PRN

## 2024-05-20 MED ORDER — SODIUM CHLORIDE 0.9 % IV BOLUS
1000.0000 mL | Freq: Once | INTRAVENOUS | Status: AC
  Administered 2024-05-20: 1000 mL via INTRAVENOUS

## 2024-05-20 MED ORDER — POTASSIUM CHLORIDE 10 MEQ/100ML IV SOLN
10.0000 meq | Freq: Once | INTRAVENOUS | Status: AC
  Administered 2024-05-20: 10 meq via INTRAVENOUS
  Filled 2024-05-20: qty 100

## 2024-05-20 MED ORDER — ONDANSETRON HCL 4 MG PO TABS
4.0000 mg | ORAL_TABLET | Freq: Four times a day (QID) | ORAL | Status: DC | PRN
  Administered 2024-05-21 – 2024-06-02 (×18): 4 mg via ORAL
  Filled 2024-05-20 (×5): qty 1

## 2024-05-20 MED ORDER — ACETAMINOPHEN 325 MG PO TABS
650.0000 mg | ORAL_TABLET | Freq: Four times a day (QID) | ORAL | Status: DC | PRN
  Administered 2024-05-21 – 2024-05-30 (×4): 650 mg via ORAL
  Filled 2024-05-20 (×2): qty 2

## 2024-05-20 NOTE — ED Provider Notes (Signed)
 "  [facility] Provider Note    Event Date/Time   First MD Initiated Contact with Patient 05/20/24 1244     (approximate)   History   Weakness   HPI  Derek Garza is a 63 y.o. male who presents today with concern of weakness and scrotal swelling.  Apparently about 6 weeks ago started developing some increased right-sided scrotal swelling which has gradually increased in size, has also developed some generalized swelling primarily along his lower abdomen.  He states he is not having pain along his scrotum but primarily just swelling and difficulty urinating because of the degree of the swelling.  He states he feels very weak overall he went to his primary doctor who checked his blood work and instructed him to come into the emergency department for further assessment and evaluation.  He denies any other focal symptoms or complaints.  I reviewed his recent outpatient visit.     Physical Exam   Triage Vital Signs: ED Triage Vitals  Encounter Vitals Group     BP 05/20/24 0931 (!) 153/79     Girls Systolic BP Percentile --      Girls Diastolic BP Percentile --      Boys Systolic BP Percentile --      Boys Diastolic BP Percentile --      Pulse Rate 05/20/24 0931 95     Resp 05/20/24 0931 18     Temp 05/20/24 0931 98.6 F (37 C)     Temp Source 05/20/24 0931 Oral     SpO2 05/20/24 0931 99 %     Weight 05/20/24 0909 225 lb (102.1 kg)     Height 05/20/24 0909 6' (1.829 m)     Head Circumference --      Peak Flow --      Pain Score 05/20/24 0909 0     Pain Loc --      Pain Education --      Exclude from Growth Chart --     Most recent vital signs: Vitals:   05/20/24 1430 05/20/24 1500  BP: (!) 144/73 136/85  Pulse: 86 83  Resp: 12 11  Temp:    SpO2: 97% 100%     General: Awake, no distress.  CV:  Good peripheral perfusion.  Resp:  Normal effort.  Able to speak full sentences, clear to auscultation Abd:  Mild abdominal distention without  significant discomfort to palpation GU:  Chaperone Riggins RN present, Hemoccult negative here with dark brown-colored stool, on scrotal exam, left testicle was able to be appreciated, there is significant scrotal edema primarily along the right side without overlying erythema, I do not appreciate significant tenderness no noted crepitus along the area, penis appears normal without significant drainage foul smell or discharge noted Other:     ED Results / Procedures / Treatments   Labs (all labs ordered are listed, but only abnormal results are displayed) Labs Reviewed  COMPREHENSIVE METABOLIC PANEL WITH GFR - Abnormal; Notable for the following components:      Result Value   Potassium 3.1 (*)    Glucose, Bld 127 (*)    Calcium  7.3 (*)    Total Protein 5.0 (*)    Albumin 2.4 (*)    Alkaline Phosphatase 168 (*)    All other components within normal limits  CBC - Abnormal; Notable for the following components:   WBC 2.0 (*)    RBC 2.19 (*)    Hemoglobin 8.0 (*)  HCT 25.8 (*)    MCV 117.8 (*)    MCH 36.5 (*)    All other components within normal limits  URINALYSIS, ROUTINE W REFLEX MICROSCOPIC - Abnormal; Notable for the following components:   Color, Urine STRAW (*)    APPearance CLEAR (*)    Glucose, UA >=500 (*)    Hgb urine dipstick SMALL (*)    Protein, ur 100 (*)    All other components within normal limits  CBC WITH DIFFERENTIAL/PLATELET - Abnormal; Notable for the following components:   WBC 2.0 (*)    RBC 2.16 (*)    Hemoglobin 7.9 (*)    HCT 25.8 (*)    MCV 119.4 (*)    MCH 36.6 (*)    Neutro Abs 0.8 (*)    Lymphs Abs 0.5 (*)    All other components within normal limits  TSH  PATHOLOGIST SMEAR REVIEW  CBG MONITORING, ED     EKG  On my independent interpretation appears to be sinus rhythm with rate of about 85, axis of 0, intervals appear to be within normal limits, no obvious ischemia that I appreciate on his  EKG   RADIOLOGY   PROCEDURES:  Critical Care performed: No  Procedures   MEDICATIONS ORDERED IN ED: Medications  potassium chloride  10 mEq in 100 mL IVPB (has no administration in time range)  sodium chloride  0.9 % bolus 1,000 mL (1,000 mLs Intravenous New Bag/Given 05/20/24 1503)  acetaminophen  (TYLENOL ) tablet 975 mg (975 mg Oral Given 05/20/24 1456)     IMPRESSION / MDM / ASSESSMENT AND PLAN / ED COURSE  I reviewed the triage vital signs and the nursing notes.                               Patient's presentation is most consistent with acute complicated illness / injury requiring diagnostic workup.  63 year old male who presents today with concern of scrotal edema and swelling over the last few weeks and increased fatigue.  His labs demonstrate significant drop in the hemoglobin as well as a leukopenia, DIF was not obtained initially so we will go ahead and add this on, his urinalysis demonstrates significant protein, and his protein level has also dropped significantly as well as a significant low albumin here.  No clear source or explanation for the symptomatology.  Considering possibly nephrotic syndrome versus other possible cause for anemia, he is Hemoccult negative here.  Will follow-up scrotal ultrasound to further assess and anticipate likely admission.       FINAL CLINICAL IMPRESSION(S) / ED DIAGNOSES   Final diagnoses:  Scrotal swelling  Anemia, unspecified type  Leukopenia, unspecified type     Rx / DC Orders   ED Discharge Orders     None        Note:  This document was prepared using Dragon voice recognition software and may include unintentional dictation errors.   Fernand Rossie HERO, MD 05/20/24 [phone]  "

## 2024-05-20 NOTE — ED Triage Notes (Addendum)
 Pt via POV from home. Pt c/o generalized weakness for the past couple of weeks. pt was seen at his PCP office, pt was sent over for abnormal lab work. Including low hgb and low WBC. Reports that he also has some swelling to the R testicle. Pt is A&Ox4 and NAD

## 2024-05-20 NOTE — Progress Notes (Signed)
 Patient requesting to have his home medication ordered.Noted to be upset , pacing in the room and came out to the nurse's desk to request meds. He states '' he worry about going into withdrawal'' Patient provided home doses of seroquel  800mg   at bedtime, gabapentin  600mg  bid and carbamazepine  400mg  bid. Medication doses also noted in navigator under home meds. On call provider notified and orders for requested medications received.

## 2024-05-20 NOTE — Telephone Encounter (Signed)
 Copied from CRM [id number]. Topic: Clinical - Lab/Test Results >> May 20, 2024  8:19 AM Amy B wrote: Reason for CRM: Patient requests a call back to discuss lab results.  Please call [phone]

## 2024-05-20 NOTE — Telephone Encounter (Signed)
 Spoke to patient on telephone at 0815 and reviewed labs. Significant drop in H/H noted and low WBC + neutrophils + elevation in ferritin with normal iron. Protein level low. Kidney and liver function normal. With newer onset of anasarca to legs up to thigh, abdomen and lower back, fatigue, along with abnormal CBC have advised patient to go to ER today for sooner work-up with imaging to further assess for underlying cause. He agrees with this plan of care and plans to go to [facility]. Nursing staff calling over to alert them.

## 2024-05-20 NOTE — H&P (Signed)
 " History and Physical    Derek Garza FMW:[id number] DOB: March 12, 1961 DOA: 05/20/2024  DOS: the patient was seen and examined on 05/20/2024  PCP: Valerio Melanie [name], NP   Patient coming from: Home  I have personally briefly reviewed patient's old medical records in [facility] and CareEverywhere  HPI:   Derek Garza is a 63 y.o. year old male with medical history of HTN, HLD, DMII, BPH presenting to the ED with worsening scrotal swelling and fatigue.   Patient reports he has been having worsening swelling over the last month.  He states he has been gaining weight steadily over this time.  He does not report decreasing urination.  He states during this time he has been feeling worsening malaise and weakness.  He denies any URI symptoms.  He denies any fevers or chills. He has significant anxiety regarding underlying reason behind this. He states his mother had bone cancer but not sure what age she had this. No hx of testicular cancer in the family.    On arrival to the ED patient was noted to be HDS stable. Lab work and imaging obtained. CBC with leukopenia, and anemia that was not previously present. MCV elevated at 117.  CMP shows hypokalemia at 3.1, decreased calcium  level, decreased protein levels and albumins. ALP elevated at 168. UA with glucosuria, and proteinuria. TSH wnl. Scrotal ultrasound shows large hydrocele with left testicle being atrophic with calcifications with some concern for malignancy. Given pts presentation, [facility] contacted for admission.  Review of Systems: As mentioned in the history of present illness. All other systems reviewed and are negative.   Past Medical History:  Diagnosis Date   Bipolar disorder (HCC)    Cervical myelopathy (HCC)    Diabetes mellitus without complication (HCC)    Erectile dysfunction    Gait abnormality    Hyperlipidemia    Hypertension    Neuromuscular disorder (HCC)    Paresthesia    Peripheral neuropathy    Right inguinal  hernia     Past Surgical History:  Procedure Laterality Date   ANKLE SURGERY  05/29/1977   APPENDECTOMY  05/29/2009   CERVICAL SPINE SURGERY     HERNIA REPAIR  [id number]   INGUINAL HERNIA REPAIR Right 03/16/2023   Procedure: HERNIA REPAIR INGUINAL ADULT, open;  Surgeon: Marinda Jayson KIDD, MD;  Location: [facility] ORS;  Service: General;  Laterality: Right;   INSERTION OF MESH  03/16/2023   Procedure: INSERTION OF MESH;  Surgeon: Marinda Jayson KIDD, MD;  Location: [facility] ORS;  Service: General;;   SPINE SURGERY  10/2018   VASECTOMY       Allergies[1]  Family History  Problem Relation Age of Onset   Breast cancer Sister     Prior to Admission medications  Medication Sig Start Date End Date Taking? Authorizing Provider  atorvastatin  (LIPITOR) 10 MG tablet Take 1 tablet (10 mg total) by mouth daily. 07/05/23   Cannady, Jolene T, NP  carbamazepine  (TEGRETOL  XR) 200 MG 12 hr tablet Take 1 tablet (200 mg total) by mouth 2 (two) times daily. 06/13/23   Cannady, Jolene T, NP  cyclobenzaprine (FLEXERIL) 10 MG tablet take 1 tablet by oral route 3 times a day as needed for mscle spasm 01/24/21   [provider]  empagliflozin  (JARDIANCE ) 25 MG TABS tablet Take 1 tablet (25 mg total) by mouth daily. 07/05/23   Cannady, Jolene T, NP  furosemide  (LASIX ) 80 MG tablet Take 1 tablet (80 mg total) by mouth daily. 07/05/23  Cannady, Jolene T, NP  gabapentin  (NEURONTIN ) 600 MG tablet Take 1 tablet (600 mg total) by mouth 2 (two) times daily. 07/05/23   Cannady, Jolene T, NP  potassium chloride  (KLOR-CON ) 10 MEQ tablet Take 1 tablet (10 mEq total) by mouth daily. 07/05/23   Cannady, Jolene T, NP  QUEtiapine  (SEROQUEL ) 400 MG tablet Take 400 mg by mouth daily.  12/18/12   [provider]  tamsulosin  (FLOMAX ) 0.4 MG CAPS capsule Take 1 capsule (0.4 mg total) by mouth daily after supper. 05/19/24   Cannady, Jolene T, NP  traMADol  (ULTRAM ) 50 MG tablet Take 50 mg by mouth every 12 (twelve) hours as needed for  moderate pain (pain score 4-6). 03/16/23   [provider]    Social History:  reports that he has never smoked. He has never used smokeless tobacco. He reports current alcohol use. He reports that he does not use drugs. Pt lives by himself, does not smoke but drinks occasionally.    Physical Exam: Vitals:   05/20/24 1540 05/20/24 1544 05/20/24 1545 05/20/24 1755  BP: 122/88   (!) 141/71  Pulse: 77  87 83  Resp: 11  16 13   Temp:  98.2 F (36.8 C)  97.9 F (36.6 C)  TempSrc:  Oral  Oral  SpO2: 94%  96% 99%  Weight:      Height:        Gen: NAD HENT: NCAT CV: normal heart sounds, mild lower extremity edema Lung: CTAB Abd: No TTP, normal bowel sounds, slight abdominal distention GU: Bilateral scrotal swelling right greater than left MSK: No asymmetry, good bulk and tone Neuro: alert and oriented   Labs on Admission: I have personally reviewed following labs and imaging studies  CBC: Recent Labs  Lab 05/19/24 1150 05/20/24 0913  WBC 2.2* 2.0*  2.0*  NEUTROABS 0.5* 0.8*  HGB 8.0* 7.9*  8.0*  HCT 25.4* 25.8*  25.8*  MCV 114* 119.4*  117.8*  PLT 364 367  352   Basic Metabolic Panel: Recent Labs  Lab 05/19/24 1150 05/20/24 0913  NA 134 135  K 3.5 3.1*  CL 99 100  CO2 22 27  GLUCOSE 72 127*  BUN 10 11  CREATININE 0.71* 0.81  CALCIUM  7.4* 7.3*   GFR: Estimated Creatinine Clearance: 113.9 mL/min (by C-G formula based on SCr of 0.81 mg/dL). Liver Function Tests: Recent Labs  Lab 05/19/24 1150 05/20/24 0913  AST 15 19  ALT 11 11  ALKPHOS 162* 168*  BILITOT 0.2 0.2  PROT 4.4* 5.0*  ALBUMIN 2.4* 2.4*   No results for input(s): LIPASE, AMYLASE in the last 168 hours. No results for input(s): AMMONIA in the last 168 hours. Coagulation Profile: No results for input(s): INR, PROTIME in the last 168 hours. Cardiac Enzymes: No results for input(s): CKTOTAL, CKMB, CKMBINDEX, TROPONINI, TROPONINIHS in the last 168 hours. BNP  (last 3 results) No results for input(s): BNP in the last 8760 hours. HbA1C: No results for input(s): HGBA1C in the last 72 hours. CBG: No results for input(s): GLUCAP in the last 168 hours. Lipid Profile: No results for input(s): CHOL, HDL, LDLCALC, TRIG, CHOLHDL, LDLDIRECT in the last 72 hours. Thyroid Function Tests: Recent Labs    05/20/24 0913  TSH 3.140   Anemia Panel: Recent Labs    05/19/24 1150  VITAMINB12 >2000*  FERRITIN 720*  IRON 69   Urine analysis:    Component Value Date/Time   COLORURINE STRAW (A) 05/20/2024 0913   APPEARANCEUR CLEAR (A) 05/20/2024  0913   APPEARANCEUR Clear 05/19/2024 1135   LABSPEC 1.006 05/20/2024 0913   LABSPEC 1.009 06/09/2012 2141   PHURINE 6.0 05/20/2024 0913   GLUCOSEU >=500 (A) 05/20/2024 0913   GLUCOSEU Negative 06/09/2012 2141   HGBUR SMALL (A) 05/20/2024 0913   BILIRUBINUR NEGATIVE 05/20/2024 0913   BILIRUBINUR Negative 05/19/2024 1135   BILIRUBINUR Negative 06/09/2012 2141   KETONESUR NEGATIVE 05/20/2024 0913   PROTEINUR 100 (A) 05/20/2024 0913   NITRITE NEGATIVE 05/20/2024 0913   LEUKOCYTESUR NEGATIVE 05/20/2024 0913   LEUKOCYTESUR Negative 06/09/2012 2141    Radiological Exams on Admission: I have personally reviewed images US  SCROTUM W/DOPPLER Result Date: 05/20/2024 EXAM: ULTRASOUND SCROTUM/TESTICLES WITH DOPPLER FLOW EVALUATION 05/20/2024 02:43:54 PM TECHNIQUE: Duplex ultrasound using B-mode/gray scaled imaging, Doppler spectral analysis and color flow Doppler was obtained of the testicles. COMPARISON: US  Scrotum 03/17/2023. CLINICAL HISTORY: Swelling of right testicle. FINDINGS: RIGHT: GREY SCALE: The right testicle measures 4.8 x 3.5 x 3.4 cm. It demonstrates normal homogeneous echotexture without focal lesion. No testicular microlithiasis. DOPPLER EVALUATION: There is normal arterial and venous Doppler flow within the testicle. VARICOCELE: No scrotal varicocele. SCROTAL SAC: Interval development of  a large right hydrocele, containing echogenic debris. EPIDIDYMIS: No acute abnormality. LEFT: GREY SCALE: The left testicle measures 3.3 x 1.7 x 1.9 cm. There is chronic heterogeneity of the atrophic left testicle. On today's exam, there is a more focal hypoechogenic lesion containing punctate calcification, measuring 2 x 1.2 x 1.8 cm. No testicular microlithiasis. DOPPLER EVALUATION: There is normal arterial and venous Doppler flow within the testicle. VARICOCELE: No scrotal varicocele. SCROTAL SAC: No hydrocele. EPIDIDYMIS: No acute abnormality. INGUINAL REGIONS: Fat containing bilateral inguinal hernias, larger on the right than the left. IMPRESSION: 1. Interval development of a large right hydrocele containing echogenic debris. 2. Atrophic left testicle with a more focal hypoechogenic lesion on today's exam containing punctate calcification, measuring 2 x 1.2 x 1.8 cm. While this could represent more focal atrophy in the surrounding testicular parenchyma, a nonemergent pelvic MRI with iv contrast, should be considered to exclude a developing neoplasm. Laboratory correlation also recommended. . Electronically signed by: Rogelia Myers MD 05/20/2024 04:09 PM EST RP Workstation: [id number]    EKG: My personal interpretation of EKG shows: sinus rhythm without any acute ST changes.   Assessment/Plan Principal Problem:   Anasarca Active Problems:   DM type 2 with diabetic peripheral neuropathy (HCC)   Hypertension associated with diabetes (HCC)   Hypogonadism in male   Bipolar 2 disorder (HCC)   Hyperlipidemia associated with type 2 diabetes mellitus (HCC)   Benign prostatic hyperplasia (BPH) with straining on urination   Patient with generalized weakness and worsening swelling that is diffuse with concerns for anasarca secondary to heart failure versus albuminemia secondary to proteinuria.  Lab work including BMP and echocardiogram along with protein creatinine ratio ordered.  Echo in 2023 showed  normal diastolic and systolic function.  Will follow-up with this.  Patient given IV Lasix . He takes  80 mg oral lasix  at home for edema. Patient has scrotal swelling which may be secondary to generalized swelling but given there is some concern for malignancy, will consult urology.  Leukopenia/anemia: Some component of dilution but there could be a primary etiology and oncology has been consulted.  Appreciate their recommendations.  They are considering bone marrow biopsy.  No source of blood loss reported by the patient.  Will get CT abdomen pelvis per oncology recommendations.  Hypertension: Not on any pharmacotherapy for this.  Will continue  to monitor.  Use as needed IV medications.  Type 2 diabetes: On metformin  and Jardiance .  Will use SSI here.  Continue gabapentin .  Bipolar disorder: Will continue home med.  Hyperlipidemia: Continue home statin.  BPH: Continue home tamsulosin .   VTE prophylaxis:  SCDs  Diet: Code Status:  Full Code Telemetry:  Admission status: Observation, Telemetry bed Patient is from: Home Anticipated d/c is to: Home Anticipated d/c is in: 1-2 days   Family Communication: Updated at bedside  Consults called: Oncology, urology   Severity of Illness: The appropriate patient status for this patient is OBSERVATION. Observation status is judged to be reasonable and necessary in order to provide the required intensity of service to ensure the patient's safety. The patient's presenting symptoms, physical exam findings, and initial radiographic and laboratory data in the context of their medical condition is felt to place them at decreased risk for further clinical deterioration. Furthermore, it is anticipated that the patient will be medically stable for discharge from the hospital within 2 midnights of admission.    Morene Bathe, MD [facility] DEL. [facility]     [1]  Allergies Allergen Reactions   Percocet [Oxycodone -Acetaminophen ] Nausea Only    "

## 2024-05-20 NOTE — ED Notes (Signed)
 Resumed care from logan rn.  Pt alert.  Nsr on monitor.  Iv fluids infusing.

## 2024-05-20 NOTE — ED Notes (Signed)
 Lt green, lav, lactic, type and screen, and 1 set of cultures sent to lab.

## 2024-05-20 NOTE — ED Notes (Addendum)
 Pt moved to cpod.  Report off to brandy paramedic

## 2024-05-20 NOTE — ED Notes (Signed)
 Resumed care from logan rn.  Pt alert.  Iv meds infusing.  No acute distress.

## 2024-05-20 NOTE — ED Notes (Signed)
 Called CCMD to iniate cardiac monitoring.

## 2024-05-20 NOTE — ED Notes (Signed)
 Lab called to add on TSH and CBC with diff.

## 2024-05-21 ENCOUNTER — Observation Stay

## 2024-05-21 ENCOUNTER — Observation Stay: Admit: 2024-05-21 | Discharge: 2024-05-21 | Disposition: A | Attending: Internal Medicine

## 2024-05-21 DIAGNOSIS — N5089 Other specified disorders of the male genital organs: Secondary | ICD-10-CM | POA: Diagnosis not present

## 2024-05-21 DIAGNOSIS — N433 Hydrocele, unspecified: Secondary | ICD-10-CM

## 2024-05-21 DIAGNOSIS — R601 Generalized edema: Secondary | ICD-10-CM

## 2024-05-21 LAB — GLUCOSE, CAPILLARY
Glucose-Capillary: 104 mg/dL — ABNORMAL HIGH (ref 70–99)
Glucose-Capillary: 89 mg/dL (ref 70–99)
Glucose-Capillary: 87 mg/dL (ref 70–99)
Glucose-Capillary: 86 mg/dL (ref 70–99)
Glucose-Capillary: 103 mg/dL — ABNORMAL HIGH (ref 70–99)

## 2024-05-21 LAB — CBC
HCT: 20.5 % — ABNORMAL LOW (ref 39.0–52.0)
Hemoglobin: 6.6 g/dL — ABNORMAL LOW (ref 13.0–17.0)
MCH: 36.7 pg — ABNORMAL HIGH (ref 26.0–34.0)
MCHC: 32.2 g/dL (ref 30.0–36.0)
MCV: 113.9 fL — ABNORMAL HIGH (ref 80.0–100.0)
Platelets: 265 K/uL (ref 150–400)
RBC: 1.8 MIL/uL — ABNORMAL LOW (ref 4.22–5.81)
RDW: 15 % (ref 11.5–15.5)
WBC: 1.4 K/uL — CL (ref 4.0–10.5)
nRBC: 0 % (ref 0.0–0.2)

## 2024-05-21 LAB — HEPATITIS B SURFACE ANTIGEN: Hepatitis B Surface Ag: NONREACTIVE

## 2024-05-21 LAB — BASIC METABOLIC PANEL WITH GFR
Anion gap: 5 (ref 5–15)
BUN: 11 mg/dL (ref 8–23)
CO2: 28 mmol/L (ref 22–32)
Calcium: 7.2 mg/dL — ABNORMAL LOW (ref 8.9–10.3)
Chloride: 102 mmol/L (ref 98–111)
Creatinine, Ser: 0.61 mg/dL (ref 0.61–1.24)
GFR, Estimated: >60 mL/min
Glucose, Bld: 85 mg/dL (ref 70–99)
Potassium: 3.2 mmol/L — ABNORMAL LOW (ref 3.5–5.1)
Sodium: 135 mmol/L (ref 135–145)

## 2024-05-21 LAB — HEPATITIS B CORE ANTIBODY, IGM: Hep B C IgM: NONREACTIVE

## 2024-05-21 LAB — PROTIME-INR
INR: 1 (ref 0.8–1.2)
Prothrombin Time: 13.8 s (ref 11.4–15.2)

## 2024-05-21 LAB — ECHOCARDIOGRAM COMPLETE
AR max vel: 3.47 cm2
AV Area VTI: 4.28 cm2
AV Area mean vel: 3.56 cm2
AV Mean grad: 3 mmHg
AV Peak grad: 4.8 mmHg
Ao pk vel: 1.1 m/s
Area-P 1/2: 6.07 cm2
Height: 72 in
S' Lateral: 3.1 cm
Weight: 3516.78 [oz_av]

## 2024-05-21 LAB — RETICULOCYTES
Immature Retic Fract: 29.8 % — ABNORMAL HIGH (ref 2.3–15.9)
RBC.: 2.11 MIL/uL — ABNORMAL LOW (ref 4.22–5.81)
Retic Count, Absolute: 74.7 K/uL (ref 19.0–186.0)
Retic Ct Pct: 3.5 % — ABNORMAL HIGH (ref 0.4–3.1)

## 2024-05-21 LAB — FOLATE: Folate: 10.9 ng/mL

## 2024-05-21 LAB — ABO/RH: ABO/RH(D): O POS

## 2024-05-21 LAB — URINE CULTURE

## 2024-05-21 LAB — PREPARE RBC (CROSSMATCH)

## 2024-05-21 LAB — BRAIN NATRIURETIC PEPTIDE: BNP: 115.6 pg/mL — ABNORMAL HIGH (ref 0.0–100.0)

## 2024-05-21 LAB — HIV ANTIBODY (ROUTINE TESTING W REFLEX): HIV Screen 4th Generation wRfx: NONREACTIVE

## 2024-05-21 LAB — HEMOGLOBIN A1C
Hgb A1c MFr Bld: 4.5 % — ABNORMAL LOW (ref 4.8–5.6)
Mean Plasma Glucose: 82.45 mg/dL

## 2024-05-21 LAB — LACTATE DEHYDROGENASE: LDH: 143 U/L (ref 105–235)

## 2024-05-21 LAB — HEPATITIS C ANTIBODY: HCV Ab: NONREACTIVE

## 2024-05-21 MED ORDER — SODIUM CHLORIDE 0.9% IV SOLUTION: Freq: Once | INTRAVENOUS | Status: AC

## 2024-05-21 MED ORDER — ATORVASTATIN CALCIUM 10 MG PO TABS
10.0000 mg | ORAL_TABLET | Freq: Every day | ORAL | Status: DC
  Administered 2024-05-21 – 2024-06-02 (×13): 10 mg via ORAL
  Filled 2024-05-21 (×6): qty 1

## 2024-05-21 MED ORDER — FUROSEMIDE 10 MG/ML IJ SOLN
20.0000 mg | Freq: Two times a day (BID) | INTRAMUSCULAR | Status: DC
  Administered 2024-05-21 – 2024-05-22 (×2): 20 mg via INTRAVENOUS
  Filled 2024-05-21 (×2): qty 4

## 2024-05-21 MED ORDER — OXYCODONE HCL 5 MG PO TABS
10.0000 mg | ORAL_TABLET | Freq: Four times a day (QID) | ORAL | Status: DC | PRN
  Administered 2024-05-21 – 2024-06-02 (×39): 10 mg via ORAL
  Filled 2024-05-21 (×17): qty 2

## 2024-05-21 MED ORDER — GADOBUTROL 1 MMOL/ML IV SOLN
10.0000 mL | Freq: Once | INTRAVENOUS | Status: AC | PRN
  Administered 2024-05-21: 10 mL via INTRAVENOUS

## 2024-05-21 MED ORDER — INSULIN ASPART 100 UNIT/ML IJ SOLN
0.0000 [IU] | Freq: Three times a day (TID) | INTRAMUSCULAR | Status: DC
  Administered 2024-05-23 – 2024-05-24 (×2): 5 [IU] via SUBCUTANEOUS
  Administered 2024-05-25 – 2024-05-26 (×2): 2 [IU] via SUBCUTANEOUS
  Administered 2024-05-27: 3 [IU] via SUBCUTANEOUS
  Administered 2024-05-28 – 2024-06-02 (×5): 2 [IU] via SUBCUTANEOUS
  Administered 2024-06-02: 3 [IU] via SUBCUTANEOUS
  Filled 2024-05-21 (×2): qty 5
  Filled 2024-05-21: qty 2
  Filled 2024-05-21 (×2): qty 3

## 2024-05-21 MED ORDER — FILGRASTIM-AAFI 480 MCG/0.8ML IJ SOSY
480.0000 ug | PREFILLED_SYRINGE | Freq: Every day | INTRAMUSCULAR | Status: DC
  Administered 2024-05-21: 480 ug via SUBCUTANEOUS
  Filled 2024-05-21: qty 0.8

## 2024-05-21 MED ORDER — QUETIAPINE FUMARATE 200 MG PO TABS
400.0000 mg | ORAL_TABLET | Freq: Every day | ORAL | Status: DC
  Administered 2024-05-21: 400 mg via ORAL
  Filled 2024-05-21 (×2): qty 2

## 2024-05-21 MED ORDER — INSULIN ASPART 100 UNIT/ML IJ SOLN: 0.0000 [IU] | Freq: Every day | INTRAMUSCULAR | Status: DC

## 2024-05-21 NOTE — Consult Note (Signed)
 " [facility]  CONSULT NOTE    Date: 05/21/2024                  Patient Name:  Derek Garza  MRN: [id number]  DOB: 1961/04/22  Age / Sex: 63 y.o., male         PCP: Valerio Melanie [name], NP                 Service Requesting Consult: Dr. Dorinda                 Reason for Consult: Anasarca            History of Present Illness: Derek Garza admitted to [facility] for increasing lower extremity swelling and scrotal edema for the last 6 weeks. History taken from the patient and from chart.   CT on admission with no urinary obstruction.   Patient found to have anemia. Hematology referral said no concern for malignancy. PRBC transfusion ordered.   Patient was taking furosemide  80mg  daily and empagliflozin  25mg  daily with no improvement.   Patient denies any change in diet. He states he has not increased his salt intake. Patient denies any more use of nonsteroidal anti-inflammatory agents than usual. He takes ibuprofen occasionally for headaches.   Patient denies history of alcohol, drugs or tobacco.   Patient states he has been diagnosed with diabetes for a long time. Denies history of insulin . Patient denies history of diabetic retinopathy. Patient does have diabetic neuropathy.   Patient states he does not have a history of hypertension.   Patient denies history of kidney disease. Patient states he has never seen a nephrologist before.   Denies history of lithium use. Has diagnosis of Bipolar disease.    Medications: Outpatient medications: Medications Prior to Admission  Medication Sig Dispense Refill Last Dose/Taking   atorvastatin  (LIPITOR) 10 MG tablet Take 1 tablet (10 mg total) by mouth daily. 90 tablet 2 05/20/2024 Morning   carbamazepine  (TEGRETOL  XR) 200 MG 12 hr tablet Take 1 tablet (200 mg total) by mouth 2 (two) times daily. (Patient taking differently: Take 400 mg by mouth 2 (two) times daily.) 60 tablet 0 05/20/2024 Morning   empagliflozin   (JARDIANCE ) 25 MG TABS tablet Take 1 tablet (25 mg total) by mouth daily. 90 tablet 2 05/20/2024 Morning   furosemide  (LASIX ) 80 MG tablet Take 1 tablet (80 mg total) by mouth daily. 90 tablet 2 05/20/2024 Morning   gabapentin  (NEURONTIN ) 600 MG tablet Take 1 tablet (600 mg total) by mouth 2 (two) times daily. 180 tablet 4 05/20/2024 Morning   potassium chloride  (KLOR-CON ) 10 MEQ tablet Take 1 tablet (10 mEq total) by mouth daily. 90 tablet 2 05/20/2024 Morning   QUEtiapine  (SEROQUEL ) 400 MG tablet Take 800 mg by mouth daily.   05/19/2024 Evening   cyclobenzaprine (FLEXERIL) 10 MG tablet take 1 tablet by oral route 3 times a day as needed for mscle spasm (Patient not taking: Reported on 05/20/2024)   Not Taking   tamsulosin  (FLOMAX ) 0.4 MG CAPS capsule Take 1 capsule (0.4 mg total) by mouth daily after supper. (Patient not taking: Reported on 05/20/2024) 30 capsule 3 Not Taking   traMADol  (ULTRAM ) 50 MG tablet Take 50 mg by mouth every 12 (twelve) hours as needed for moderate pain (pain score 4-6). (Patient not taking: Reported on 05/20/2024)   Not Taking    Current medications: Current Facility-Administered Medications  Medication Dose Route Frequency Provider Last Rate Last Admin   acetaminophen  (  TYLENOL ) tablet 650 mg  650 mg Oral Q6H PRN Khan, Ghalib, MD   650 mg at 05/21/24 9288   Or   acetaminophen  (TYLENOL ) suppository 650 mg  650 mg Rectal Q6H PRN Fernand Prost, MD       atorvastatin  (LIPITOR) tablet 10 mg  10 mg Oral Daily Khan, Ghalib, MD   10 mg at 05/21/24 9047   filgrastim -aafi (NIVESTYM ) injection 480 mcg  480 mcg Subcutaneous Q2000 Brahmanday, Govinda R, MD   480 mcg at 05/21/24 1157   gabapentin  (NEURONTIN ) capsule 600 mg  600 mg Oral BID Shona Laurence N, DO   600 mg at 05/21/24 9047   insulin  aspart (novoLOG ) injection 0-15 Units  0-15 Units Subcutaneous TID WC Fernand Prost, MD       insulin  aspart (novoLOG ) injection 0-5 Units  0-5 Units Subcutaneous QHS Khan, Ghalib, MD        ondansetron  (ZOFRAN ) tablet 4 mg  4 mg Oral Q6H PRN Fernand Prost, MD       Or   ondansetron  (ZOFRAN ) injection 4 mg  4 mg Intravenous Q6H PRN Khan, Ghalib, MD   4 mg at 05/20/24 2353   senna-docusate (Senokot-S) tablet 1 tablet  1 tablet Oral QHS PRN Fernand Prost, MD       sodium chloride  flush (NS) 0.9 % injection 3 mL  3 mL Intravenous Q12H Khan, Ghalib, MD   3 mL at 05/21/24 9045      Allergies: Allergies[1]    Past Medical History: Past Medical History:  Diagnosis Date   Bipolar disorder (HCC)    Cervical myelopathy (HCC)    Diabetes mellitus without complication (HCC)    Erectile dysfunction    Gait abnormality    Hyperlipidemia    Hypertension    Neuromuscular disorder (HCC)    Paresthesia    Peripheral neuropathy    Right inguinal hernia      Past Surgical History: Past Surgical History:  Procedure Laterality Date   ANKLE SURGERY  05/29/1977   APPENDECTOMY  05/29/2009   CERVICAL SPINE SURGERY     HERNIA REPAIR  [id number]   INGUINAL HERNIA REPAIR Right 03/16/2023   Procedure: HERNIA REPAIR INGUINAL ADULT, open;  Surgeon: Marinda Jayson KIDD, MD;  Location: [facility] ORS;  Service: General;  Laterality: Right;   INSERTION OF MESH  03/16/2023   Procedure: INSERTION OF MESH;  Surgeon: Marinda Jayson KIDD, MD;  Location: [facility] ORS;  Service: General;;   SPINE SURGERY  10/2018   VASECTOMY       Family History: Family History  Problem Relation Age of Onset   Breast cancer Sister      Social History: Social History   Socioeconomic History   Marital status: Divorced    Spouse name: Not on file   Number of children: Not on file   Years of education: Not on file   Highest education level: Not on file  Occupational History   Occupation: on [employer]  Tobacco Use   Smoking status: Never   Smokeless tobacco: Never  Vaping Use   Vaping status: Never Used  Substance and Sexual Activity   Alcohol use: Yes    Comment: on occasion   Drug use: No   Sexual activity: Not  Currently  Other Topics Concern   Not on file  Social History Narrative   Lives alone   Right Handed   Drinks 2 liter of soda daily   Social Drivers of Health   Tobacco Use: Low Risk (05/20/2024)  Patient History    Smoking Tobacco Use: Never    Smokeless Tobacco Use: Never    Passive Exposure: Not on file  Financial Resource Strain: Low Risk (07/05/2023)   Overall Financial Resource Strain (CARDIA)    Difficulty of Paying Living Expenses: Not hard at all  Food Insecurity: No Food Insecurity (05/20/2024)   Epic    Worried About Programme Researcher, Broadcasting/film/video in the Last Year: Never true    Ran Out of Food in the Last Year: Never true  Transportation Needs: No Transportation Needs (05/20/2024)   Epic    Lack of Transportation (Medical): No    Lack of Transportation (Non-Medical): No  Physical Activity: Sufficiently Active (07/05/2023)   Exercise Vital Sign    Days of Exercise per Week: 5 days    Minutes of Exercise per Session: 30 min  Stress: No Stress Concern Present (07/05/2023)   [employer] of Occupational Health - Occupational Stress Questionnaire    Feeling of Stress : Only a little  Social Connections: Socially Isolated (07/05/2023)   Social Connection and Isolation Panel    Frequency of Communication with Friends and Family: Three times a week    Frequency of Social Gatherings with Friends and Family: Three times a week    Attends Religious Services: Never    Active Member of Clubs or Organizations: No    Attends Banker Meetings: Never    Marital Status: Divorced  Catering Manager Violence: Not At Risk (05/20/2024)   Epic    Fear of Current or Ex-Partner: No    Emotionally Abused: No    Physically Abused: No    Sexually Abused: No  Depression (PHQ2-9): Medium Risk (05/19/2024)   Depression (PHQ2-9)    PHQ-2 Score: 9  Alcohol Screen: Low Risk (07/05/2023)   Alcohol Screen    Last Alcohol Screening Score (AUDIT): 0  Housing: Low Risk (05/20/2024)   Epic     Unable to Pay for Housing in the Last Year: No    Number of Times Moved in the Last Year: 0    Homeless in the Last Year: No  Utilities: Not At Risk (05/20/2024)   Epic    Threatened with loss of utilities: No  Health Literacy: Adequate Health Literacy (07/05/2023)   B1300 Health Literacy    Frequency of need for help with medical instructions: Never       Vital Signs: Blood pressure 129/64, pulse 77, temperature 98.2 F (36.8 C), temperature source Oral, resp. rate 18, height 6' (1.829 m), weight 99.7 kg, SpO2 99%.  Weight trends: Filed Weights   05/20/24 0909 05/21/24 0500  Weight: 102.1 kg 99.7 kg    Physical Exam: General: NAD, laying in bed  Head: Normocephalic, atraumatic. Moist oral mucosal membranes  Eyes: Anicteric, PERRL  Neck: Supple, trachea midline  Lungs:  Clear to auscultation  Heart: Regular rate and rhythm  Abdomen:  Soft, nontender,   Extremities:  ++ peripheral edema.  Neurologic: Nonfocal, moving all four extremities  Skin: No lesions  Access: none     Lab results: Basic Metabolic Panel: Recent Labs  Lab 05/19/24 1150 05/20/24 0913 05/20/24 1816 05/21/24 0534  NA 134 135  --  135  K 3.5 3.1*  --  3.2*  CL 99 100  --  102  CO2 22 27  --  28  GLUCOSE 72 127*  --  85  BUN 10 11  --  11  CREATININE 0.71* 0.81  --  0.61  CALCIUM  7.4*  7.3*  --  7.2*  MG  --   --  1.9  --     Liver Function Tests: Recent Labs  Lab 05/19/24 1150 05/20/24 0913  AST 15 19  ALT 11 11  ALKPHOS 162* 168*  BILITOT 0.2 0.2  PROT 4.4* 5.0*  ALBUMIN  2.4* 2.4*   No results for input(s): LIPASE, AMYLASE in the last 168 hours. No results for input(s): AMMONIA in the last 168 hours.  CBC: Recent Labs  Lab 05/19/24 1150 05/20/24 0913 05/21/24 0534  WBC 2.2* 2.0*  2.0* 1.4*  NEUTROABS 0.5* 0.8*  --   HGB 8.0* 7.9*  8.0* 6.6*  HCT 25.4* 25.8*  25.8* 20.5*  MCV 114* 119.4*  117.8* 113.9*  PLT 364 367  352 265    Cardiac Enzymes: No results for  input(s): CKTOTAL, CKMB, CKMBINDEX, TROPONINI in the last 168 hours.  BNP: Invalid input(s): POCBNP  CBG: Recent Labs  Lab 05/21/24 0136 05/21/24 0735 05/21/24 1146  GLUCAP 104* 89 87    Microbiology: Results for orders placed or performed in visit on 05/19/24  Microscopic Examination     Status: None   Collection Time: 05/19/24 11:35 AM   Urine  Result Value Ref Range Status   WBC, UA None seen 0 - 5 /hpf Final   RBC, Urine 0-2 0 - 2 /hpf Final   Epithelial Cells (non renal) None seen 0 - 10 /hpf Final   Bacteria, UA None seen None seen/Few Final  Urine Culture     Status: None   Collection Time: 05/19/24  1:19 PM   Specimen: Urine   UR  Result Value Ref Range Status   Urine Culture, Routine Final report  Final   Organism ID, Bacteria Comment  Final    Comment: Mixed urogenital flora Less than 10,000 colonies/mL     Coagulation Studies: Recent Labs    05/21/24 1047  LABPROT 13.8  INR 1.0    Urinalysis: Recent Labs    05/19/24 1135 05/20/24 0913  COLORURINE  --  STRAW*  LABSPEC  --  1.006  PHURINE  --  6.0  GLUCOSEU 1+* >=500*  HGBUR  --  SMALL*  BILIRUBINUR Negative NEGATIVE  KETONESUR  --  NEGATIVE  PROTEINUR 3+* 100*  NITRITE Negative NEGATIVE  LEUKOCYTESUR Negative NEGATIVE      Imaging: CT CHEST ABDOMEN PELVIS W CONTRAST Result Date: 05/20/2024 CLINICAL DATA:  Metastatic disease evaluation. EXAM: CT CHEST, ABDOMEN, AND PELVIS WITH CONTRAST TECHNIQUE: Multidetector CT imaging of the chest, abdomen and pelvis was performed following the standard protocol during bolus administration of intravenous contrast. RADIATION DOSE REDUCTION: This exam was performed according to the departmental dose-optimization program which includes automated exposure control, adjustment of the mA and/or kV according to patient size and/or use of iterative reconstruction technique. CONTRAST:  OMNIPAQUE  IOHEXOL  300 MG/ML  SOLN COMPARISON:  CT abdomen pelvis  dated 03/17/2023. FINDINGS: CT CHEST FINDINGS Cardiovascular: There is no cardiomegaly. Small pericardial effusion. Three vessel coronary vascular calcification. Mild atherosclerotic calcification of the thoracic aorta. No aneurysmal dilatation or dissection. The origins of the great vessels of the aortic arch and the central pulmonary arteries are patent. Mediastinum/Nodes: No hilar or mediastinal adenopathy. The esophagus is grossly unremarkable no mediastinal fluid collection. Lungs/Pleura: Small left and trace right pleural effusions. There is partial compressive atelectasis of the left lower lobe. Pneumonia is not excluded. No pneumothorax. The central airways are patent. Musculoskeletal: Degenerative changes of the spine. No acute osseous pathology. 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 CT ABDOMEN PELVIS FINDINGS No intra-abdominal free air.  Small ascites. Hepatobiliary: The liver is unremarkable. No biliary dilatation. Cholecystectomy. Pancreas: Moderately atrophic pancreas. No active inflammatory changes. Spleen: Normal in size without focal abnormality. Adrenals/Urinary Tract: The adrenal glands unremarkable. Punctate nonobstructing left renal pole calculus. There is no hydronephrosis on either side. There is symmetric enhancement and excretion of contrast by both kidneys. 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 There is diffuse colonic thickening which may be related to underdistention but concerning for colitis. Clinical correlation recommended. There is no bowel obstruction. Appendectomy. Vascular/Lymphatic: Moderate aortoiliac atherosclerotic disease. The IVC is unremarkable. No portal venous gas. There is no adenopathy. Reproductive: The prostate gland is grossly unremarkable. Other: Small fat containing bilateral inguinal hernias. Partially visualized bilateral hydroceles, right greater than left. The left  testicle appears atrophic. See report for testicular ultrasound. Musculoskeletal: Osteopenia with degenerative changes of the spine. No acute osseous pathology. There is diffuse subcutaneous edema and anasarca. No fluid collection. IMPRESSION: 1. No evidence of metastatic disease in the chest, abdomen, or pelvis. 2. Small left and trace right pleural effusions with partial compressive atelectasis of the left lower lobe. Pneumonia is not excluded. 3. Small ascites and anasarca. 4. Possible colitis.  No bowel obstruction. 5. Punctate nonobstructing left renal calculus. No hydronephrosis. 6.  Aortic Atherosclerosis (ICD10-I70.0). Electronically Signed   By: Vanetta Chou M.D.   On: 05/20/2024 21:05   US  SCROTUM W/DOPPLER Result Date: 05/20/2024 EXAM: ULTRASOUND SCROTUM/TESTICLES WITH DOPPLER FLOW EVALUATION 05/20/2024 02:43:54 PM TECHNIQUE: Duplex ultrasound using B-mode/gray scaled imaging, Doppler spectral analysis and color flow Doppler was obtained of the testicles. COMPARISON: US  Scrotum 03/17/2023. CLINICAL HISTORY: Swelling of right testicle. FINDINGS: RIGHT: GREY SCALE: The right testicle measures 4.8 x 3.5 x 3.4 cm. It demonstrates normal homogeneous echotexture without focal lesion. No testicular microlithiasis. DOPPLER EVALUATION: There is normal arterial and venous Doppler flow within the testicle. VARICOCELE: No scrotal varicocele. SCROTAL SAC: Interval development of a large right hydrocele, containing echogenic debris. EPIDIDYMIS: No acute abnormality. LEFT: GREY SCALE: The left testicle measures 3.3 x 1.7 x 1.9 cm. There is chronic heterogeneity of the atrophic left testicle. On today's exam, there is a more focal hypoechogenic lesion containing punctate calcification, measuring 2 x 1.2 x 1.8 cm. No testicular microlithiasis. DOPPLER EVALUATION: There is normal arterial and venous Doppler flow within the testicle. VARICOCELE: No scrotal varicocele. SCROTAL SAC: No hydrocele. EPIDIDYMIS: No acute  abnormality. INGUINAL REGIONS: Fat containing bilateral inguinal hernias, larger on the right than the left. IMPRESSION: 1. Interval development of a large right hydrocele containing echogenic debris. 2. Atrophic left testicle with a more focal hypoechogenic lesion on today's exam containing punctate calcification, measuring 2 x 1.2 x 1.8 cm. While this could represent more focal atrophy in the surrounding testicular parenchyma, a nonemergent pelvic MRI with iv contrast, should be considered to exclude a developing neoplasm. Laboratory correlation also recommended. . Electronically signed by: Rogelia Myers MD 05/20/2024 04:09 PM EST RP Workstation: [id number]     Assessment & Plan: Derek Garza is a 63 y.o.  male with hypertension, hyperlipidemia, diabetes mellitus type II, and BPH, who was admitted to [facility] on 05/20/2024 for Anasarca [R60.1] Scrotal swelling [N50.89] Anemia, unspecified type [D64.9] Leukopenia, unspecified type [D72.819]  Anasarca with nephrotic range proteinuria Hypertension Diabetes mellitus type II with renal manifestations Hypokalemia  Differential includes acute glomerulopathy, myeloplastic disease, progressive diabetic nephropathy, hepatic cirrhosis or  congestive heart failure.  - serologic work up. - start furosemide  20mg  IV bid - restart empagliflozin  - patient may need renal biopsy.  - discussed role of mineralocorticoid receptor antagonist - appreciate hematology input.     LOS: 0 Jordi Kamm 12/24/20252:01 PM     [1]  Allergies Allergen Reactions   Percocet [Oxycodone -Acetaminophen ] Nausea Only   "

## 2024-05-21 NOTE — Consult Note (Signed)
 "     Chief Complaint: Patient was seen in consultation today for neutropenia/anemia.  Referring Physician(s): Brahmanday, Govinda, MD  Supervising Physician: Johann Sieving  Patient Status: [facility] - In-pt  History of Present Illness: Derek Garza is a 63 y.o. male with a past medical history significant for bipolar disorder, neuromuscular disorder, BPH, HTN, HLD, DM who presented to the ED yesterday with complaints of scrotal swelling, weight gain and fatigue for about a month. He had been seen by primary care on 12/22 for similar complaints and had lab work done which showed new neutropenia and anemia and it was recommend he present to the ED for further evaluation.   In the ED repeat labs again noted neutropenia and anemia as well as hypoalbuminemia, hypoproteinemia and hypokalemia. UA showed glycosuria and proteinuria. Scrotal ultrasound showed large right hydrocele and atrophic left testicle with calcifications, pelvic MRI was recommended and planned for today. He was admitted by the hospitalist service and urology as well as heme/onc were consulted. IR has been consulted for bone marrow aspiration/biopsy to rule out possible malignancy.  Patient seen on the floor, blood transfusion recently started. He is aware of the request for bone marrow biopsy and is very agreeable as he is anxious to find out what is going on. He reports multiple teeth that are broken at the gum line and wants to make sure the care team is aware of this in case there is concern for dental abscess, these teeth have been broken for > 6 months and he does not currently have pain, swelling or purulent drainage. He is followed by a dentist. Reviewed need for NPO prior to procedure, he understands but is concerned about being a diabetic and not being allowed to eat for multiple hours - discussed that we would monitor his blood sugar and provide IV supplementation if needed which he is agreeable to.  Past Medical History:   Diagnosis Date   Bipolar disorder (HCC)    Cervical myelopathy (HCC)    Diabetes mellitus without complication (HCC)    Erectile dysfunction    Gait abnormality    Hyperlipidemia    Hypertension    Neuromuscular disorder (HCC)    Paresthesia    Peripheral neuropathy    Right inguinal hernia     Past Surgical History:  Procedure Laterality Date   ANKLE SURGERY  05/29/1977   APPENDECTOMY  05/29/2009   CERVICAL SPINE SURGERY     HERNIA REPAIR  [id number]   INGUINAL HERNIA REPAIR Right 03/16/2023   Procedure: HERNIA REPAIR INGUINAL ADULT, open;  Surgeon: Marinda Jayson KIDD, MD;  Location: [facility] ORS;  Service: General;  Laterality: Right;   INSERTION OF MESH  03/16/2023   Procedure: INSERTION OF MESH;  Surgeon: Marinda Jayson KIDD, MD;  Location: [facility] ORS;  Service: General;;   SPINE SURGERY  10/2018   VASECTOMY      Allergies: Percocet [oxycodone -acetaminophen ]  Medications: Prior to Admission medications  Medication Sig Start Date End Date Taking? Authorizing Provider  atorvastatin  (LIPITOR) 10 MG tablet Take 1 tablet (10 mg total) by mouth daily. 07/05/23  Yes Cannady, Jolene T, NP  carbamazepine  (TEGRETOL  XR) 200 MG 12 hr tablet Take 1 tablet (200 mg total) by mouth 2 (two) times daily. Patient taking differently: Take 400 mg by mouth 2 (two) times daily. 06/13/23  Yes Cannady, Jolene T, NP  empagliflozin  (JARDIANCE ) 25 MG TABS tablet Take 1 tablet (25 mg total) by mouth daily. 07/05/23  Yes Cannady, Jolene T, NP  furosemide  (  LASIX ) 80 MG tablet Take 1 tablet (80 mg total) by mouth daily. 07/05/23  Yes Cannady, Jolene T, NP  gabapentin  (NEURONTIN ) 600 MG tablet Take 1 tablet (600 mg total) by mouth 2 (two) times daily. 07/05/23  Yes Cannady, Jolene T, NP  potassium chloride  (KLOR-CON ) 10 MEQ tablet Take 1 tablet (10 mEq total) by mouth daily. 07/05/23  Yes Cannady, Jolene T, NP  QUEtiapine  (SEROQUEL ) 400 MG tablet Take 800 mg by mouth daily. 12/18/12  Yes [provider]   cyclobenzaprine (FLEXERIL) 10 MG tablet take 1 tablet by oral route 3 times a day as needed for mscle spasm Patient not taking: Reported on 05/20/2024 01/24/21   [provider]  tamsulosin  (FLOMAX ) 0.4 MG CAPS capsule Take 1 capsule (0.4 mg total) by mouth daily after supper. Patient not taking: Reported on 05/20/2024 05/19/24   Cannady, Jolene T, NP  traMADol  (ULTRAM ) 50 MG tablet Take 50 mg by mouth every 12 (twelve) hours as needed for moderate pain (pain score 4-6). Patient not taking: Reported on 05/20/2024 03/16/23   [provider]     Family History  Problem Relation Age of Onset   Breast cancer Sister     Social History   Socioeconomic History   Marital status: Divorced    Spouse name: Not on file   Number of children: Not on file   Years of education: Not on file   Highest education level: Not on file  Occupational History   Occupation: on [employer]  Tobacco Use   Smoking status: Never   Smokeless tobacco: Never  Vaping Use   Vaping status: Never Used  Substance and Sexual Activity   Alcohol use: Yes    Comment: on occasion   Drug use: No   Sexual activity: Not Currently  Other Topics Concern   Not on file  Social History Narrative   Lives alone   Right Handed   Drinks 2 liter of soda daily   Social Drivers of Health   Tobacco Use: Low Risk (05/20/2024)   Patient History    Smoking Tobacco Use: Never    Smokeless Tobacco Use: Never    Passive Exposure: Not on file  Financial Resource Strain: Low Risk (07/05/2023)   Overall Financial Resource Strain (CARDIA)    Difficulty of Paying Living Expenses: Not hard at all  Food Insecurity: No Food Insecurity (05/20/2024)   Epic    Worried About Radiation Protection Practitioner of Food in the Last Year: Never true    Ran Out of Food in the Last Year: Never true  Transportation Needs: No Transportation Needs (05/20/2024)   Epic    Lack of Transportation (Medical): No    Lack of Transportation (Non-Medical): No   Physical Activity: Sufficiently Active (07/05/2023)   Exercise Vital Sign    Days of Exercise per Week: 5 days    Minutes of Exercise per Session: 30 min  Stress: No Stress Concern Present (07/05/2023)   [employer] of Occupational Health - Occupational Stress Questionnaire    Feeling of Stress : Only a little  Social Connections: Socially Isolated (07/05/2023)   Social Connection and Isolation Panel    Frequency of Communication with Friends and Family: Three times a week    Frequency of Social Gatherings with Friends and Family: Three times a week    Attends Religious Services: Never    Active Member of Clubs or Organizations: No    Attends Banker Meetings: Never    Marital Status: Divorced  Depression (PHQ2-9): Medium Risk (05/19/2024)   Depression (PHQ2-9)    PHQ-2 Score: 9  Alcohol Screen: Low Risk (07/05/2023)   Alcohol Screen    Last Alcohol Screening Score (AUDIT): 0  Housing: Low Risk (05/20/2024)   Epic    Unable to Pay for Housing in the Last Year: No    Number of Times Moved in the Last Year: 0    Homeless in the Last Year: No  Utilities: Not At Risk (05/20/2024)   Epic    Threatened with loss of utilities: No  Health Literacy: Adequate Health Literacy (07/05/2023)   B1300 Health Literacy    Frequency of need for help with medical instructions: Never     Review of Systems: A 12 point ROS discussed and pertinent positives are indicated in the HPI above.  All other systems are negative.  Review of Systems  Constitutional:  Positive for fatigue. Negative for chills and fever.  HENT:         (+) multiple broken teeth  Respiratory:  Negative for cough and shortness of breath.   Cardiovascular:  Negative for chest pain.  Gastrointestinal:  Negative for abdominal pain.  Musculoskeletal:        (+) right shoulder pain - torn rotator cuff per patient  Neurological:  Negative for dizziness and headaches.    Vital Signs: BP 131/72 (BP Location: Left Arm)    Pulse 94   Temp 98 F (36.7 C)   Resp 17   Ht 6' (1.829 m)   Wt 219 lb 12.8 oz (99.7 kg)   SpO2 98%   BMI 29.81 kg/m   Physical Exam Vitals and nursing note reviewed.  Constitutional:      General: He is not in acute distress. HENT:     Head: Normocephalic.     Mouth/Throat:     Mouth: Mucous membranes are moist.     Pharynx: Oropharynx is clear. No oropharyngeal exudate or posterior oropharyngeal erythema.  Cardiovascular:     Rate and Rhythm: Normal rate and regular rhythm.  Pulmonary:     Effort: Pulmonary effort is normal.     Breath sounds: Normal breath sounds.  Abdominal:     Palpations: Abdomen is soft.  Skin:    General: Skin is warm and dry.  Neurological:     Mental Status: He is alert and oriented to person, place, and time.  Psychiatric:        Mood and Affect: Mood normal.        Behavior: Behavior normal.        Thought Content: Thought content normal.        Judgment: Judgment normal.      MD Evaluation Airway: WNL Heart: WNL Abdomen: WNL Chest/ Lungs: WNL ASA  Classification: 3 Mallampati/Airway Score: Two   Imaging: CT CHEST ABDOMEN PELVIS W CONTRAST Result Date: 05/20/2024 CLINICAL DATA:  Metastatic disease evaluation. EXAM: CT CHEST, ABDOMEN, AND PELVIS WITH CONTRAST TECHNIQUE: Multidetector CT imaging of the chest, abdomen and pelvis was performed following the standard protocol during bolus administration of intravenous contrast. RADIATION DOSE REDUCTION: This exam was performed according to the departmental dose-optimization program which includes automated exposure control, adjustment of the mA and/or kV according to patient size and/or use of iterative reconstruction technique. CONTRAST:  OMNIPAQUE  IOHEXOL  300 MG/ML  SOLN COMPARISON:  CT abdomen pelvis dated 03/17/2023. FINDINGS: CT CHEST FINDINGS Cardiovascular: There is no cardiomegaly. Small pericardial effusion. Three vessel coronary vascular calcification. Mild atherosclerotic  calcification of the thoracic  aorta. No aneurysmal dilatation or dissection. The origins of the great vessels of the aortic arch and the central pulmonary arteries are patent. Mediastinum/Nodes: No hilar or mediastinal adenopathy. The esophagus is grossly unremarkable no mediastinal fluid collection. Lungs/Pleura: Small left and trace right pleural effusions. There is partial compressive atelectasis of the left lower lobe. Pneumonia is not excluded. No pneumothorax. The central airways are patent. Musculoskeletal: Degenerative changes of the spine. No acute osseous pathology. 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 CT ABDOMEN PELVIS FINDINGS No intra-abdominal free air.  Small ascites. Hepatobiliary: The liver is unremarkable. No biliary dilatation. Cholecystectomy. Pancreas: Moderately atrophic pancreas. No active inflammatory changes. Spleen: Normal in size without focal abnormality. Adrenals/Urinary Tract: The adrenal glands unremarkable. Punctate nonobstructing left renal pole calculus. There is no hydronephrosis on either side. There is symmetric enhancement and excretion of contrast by both kidneys. 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 There is diffuse colonic thickening which may be related to underdistention but concerning for colitis. Clinical correlation recommended. There is no bowel obstruction. Appendectomy. Vascular/Lymphatic: Moderate aortoiliac atherosclerotic disease. The IVC is unremarkable. No portal venous gas. There is no adenopathy. Reproductive: The prostate gland is grossly unremarkable. Other: Small fat containing bilateral inguinal hernias. Partially visualized bilateral hydroceles, right greater than left. The left testicle appears atrophic. See report for testicular ultrasound. Musculoskeletal: Osteopenia with degenerative changes of the spine. No acute osseous pathology. There is diffuse  subcutaneous edema and anasarca. No fluid collection. IMPRESSION: 1. No evidence of metastatic disease in the chest, abdomen, or pelvis. 2. Small left and trace right pleural effusions with partial compressive atelectasis of the left lower lobe. Pneumonia is not excluded. 3. Small ascites and anasarca. 4. Possible colitis.  No bowel obstruction. 5. Punctate nonobstructing left renal calculus. No hydronephrosis. 6.  Aortic Atherosclerosis (ICD10-I70.0). Electronically Signed   By: Vanetta Chou M.D.   On: 05/20/2024 21:05   US  SCROTUM W/DOPPLER Result Date: 05/20/2024 EXAM: ULTRASOUND SCROTUM/TESTICLES WITH DOPPLER FLOW EVALUATION 05/20/2024 02:43:54 PM TECHNIQUE: Duplex ultrasound using B-mode/gray scaled imaging, Doppler spectral analysis and color flow Doppler was obtained of the testicles. COMPARISON: US  Scrotum 03/17/2023. CLINICAL HISTORY: Swelling of right testicle. FINDINGS: RIGHT: GREY SCALE: The right testicle measures 4.8 x 3.5 x 3.4 cm. It demonstrates normal homogeneous echotexture without focal lesion. No testicular microlithiasis. DOPPLER EVALUATION: There is normal arterial and venous Doppler flow within the testicle. VARICOCELE: No scrotal varicocele. SCROTAL SAC: Interval development of a large right hydrocele, containing echogenic debris. EPIDIDYMIS: No acute abnormality. LEFT: GREY SCALE: The left testicle measures 3.3 x 1.7 x 1.9 cm. There is chronic heterogeneity of the atrophic left testicle. On today's exam, there is a more focal hypoechogenic lesion containing punctate calcification, measuring 2 x 1.2 x 1.8 cm. No testicular microlithiasis. DOPPLER EVALUATION: There is normal arterial and venous Doppler flow within the testicle. VARICOCELE: No scrotal varicocele. SCROTAL SAC: No hydrocele. EPIDIDYMIS: No acute abnormality. INGUINAL REGIONS: Fat containing bilateral inguinal hernias, larger on the right than the left. IMPRESSION: 1. Interval development of a large right hydrocele  containing echogenic debris. 2. Atrophic left testicle with a more focal hypoechogenic lesion on today's exam containing punctate calcification, measuring 2 x 1.2 x 1.8 cm. While this could represent more focal atrophy in the surrounding testicular parenchyma, a nonemergent pelvic MRI with iv contrast, should be considered to exclude a developing neoplasm. Laboratory correlation also recommended. . Electronically signed by: Rogelia Myers MD 05/20/2024  04:09 PM EST RP Workstation: [id number]    Labs:  CBC: Recent Labs    07/05/23 0820 05/19/24 1150 05/20/24 0913 05/21/24 0534  WBC 6.1 2.2* 2.0*  2.0* 1.4*  HGB 14.2 8.0* 7.9*  8.0* 6.6*  HCT 42.7 25.4* 25.8*  25.8* 20.5*  PLT 302 364 367  352 265    COAGS: No results for input(s): INR, APTT in the last 8760 hours.  BMP: Recent Labs    02/06/24 1039 05/19/24 1150 05/20/24 0913 05/21/24 0534  NA 135 134 135 135  K 3.9 3.5 3.1* 3.2*  CL 99 99 100 102  CO2 22 22 27 28   GLUCOSE 80 72 127* 85  BUN 11 10 11 11   CALCIUM  8.7 7.4* 7.3* 7.2*  CREATININE 0.74* 0.71* 0.81 0.61  GFRNONAA  --   --  >60 >60    LIVER FUNCTION TESTS: Recent Labs    07/05/23 0820 02/06/24 1039 05/19/24 1150 05/20/24 0913  BILITOT 0.3 0.3 0.2 0.2  AST 19 31 15 19   ALT 25 28 11 11   ALKPHOS 122* 129* 162* 168*  PROT 7.2 6.0 4.4* 5.0*  ALBUMIN 4.5 3.8* 2.4* 2.4*    TUMOR MARKERS: No results for input(s): AFPTM, CEA, CA199, CHROMGRNA in the last 8760 hours.  Assessment and Plan:  63 y/o M admitted with severe neutropenia/anemia, scrotal swelling and fatigue. IR has been consulted for bone marrow aspiration/biopsy to rule out possible malignancy as cause of presenting symptoms.  Plan: - Biopsy tentatively planned for Friday 12/26 pending cytology availability. Will not know if cytology can accommodate this procedure until AM of 12/26. If we are unable to accommodate then will plan for Monday 12/29. Discussed with Dr. Rennie,  patient is not appropriate for outpatient biopsy given degree of neutropenia/anemia. Discussed above plan with patient who states understanding. - NPO at midnight on 12/26, sips with meds. - Pre procedure CBC w/diff AM of 12/26.  Risks and benefits of bone marrow aspiration/biopsy was discussed with the patient and/or patient's family including, but not limited to bleeding, infection, damage to adjacent structures or low yield requiring additional tests.  All of the questions were answered and there is agreement to proceed.  Consent signed and in chart.  Thank you for this interesting consult.  I greatly enjoyed meeting Brody Bonneau and look forward to participating in their care.  A copy of this report was sent to the requesting provider on this date.  Electronically Signed: Clotilda DELENA Hesselbach, PA-C 05/21/2024, 9:53 AM   I spent a total of 40 Minutes  in face to face in clinical consultation, greater than 50% of which was counseling/coordinating care for neutropenia/anemia.  "

## 2024-05-21 NOTE — Progress Notes (Signed)
 PT Cancellation Note  Patient Details Name: Derek Garza MRN: [id number] DOB: 04-21-1961   Cancelled Treatment:    Reason Eval/Treat Not Completed: PT screened, no needs identified, will sign off (Confirmed with nurse that patient is independent with mobility. No anticipated PT needs at this time.)  Randine Essex, PT, MPT  Randine LULLA Essex 05/21/2024, 10:14 AM

## 2024-05-21 NOTE — Consult Note (Signed)
 "    Urology Consult   I have been asked to see the patient by Dr. Fernand, for evaluation and management of scrotal swelling, atrophic testicle.  HPI:  Derek Garza is a 63 y.o. year old   Admitted via ED overnight (05/21/24)   - chronic Right scrotal swelling, weight gain, +malaise   - Scrotal US  = large right hydrocele with echogenic debris. Atrophic Left testicle with focal hypoechogenic lesion w/ calcification measuring 2 cm.  - CT CAP = NED or relevant GU pathology  On my exam, patient was conversational although a bit irritated with diet/food options this AM Describes slow progressive Right scrotal swelling x 3-6 mo- he is unsure Also, with similar progressive BLE edema, abdominal swelling, +weight gain  Hx of prior vasectomy  Hx of prior Right inguinal hernia repair with mesh (2024)  Hx of EtOH abuse     PMH: Past Medical History:  Diagnosis Date   Bipolar disorder (HCC)    Cervical myelopathy (HCC)    Diabetes mellitus without complication (HCC)    Erectile dysfunction    Gait abnormality    Hyperlipidemia    Hypertension    Neuromuscular disorder (HCC)    Paresthesia    Peripheral neuropathy    Right inguinal hernia     Surgical History: Past Surgical History:  Procedure Laterality Date   ANKLE SURGERY  05/29/1977   APPENDECTOMY  05/29/2009   CERVICAL SPINE SURGERY     HERNIA REPAIR  [id number]   INGUINAL HERNIA REPAIR Right 03/16/2023   Procedure: HERNIA REPAIR INGUINAL ADULT, open;  Surgeon: Marinda Jayson KIDD, MD;  Location: [facility] ORS;  Service: General;  Laterality: Right;   INSERTION OF MESH  03/16/2023   Procedure: INSERTION OF MESH;  Surgeon: Marinda Jayson KIDD, MD;  Location: [facility] ORS;  Service: General;;   SPINE SURGERY  10/2018   VASECTOMY      Allergies: Allergies[1]  Family History: Family History  Problem Relation Age of Onset   Breast cancer Sister     Social History:  reports that he has never smoked. He has never used smokeless  tobacco. He reports current alcohol use. He reports that he does not use drugs.  ROS: Negative aside from those stated in the HPI.  Physical Exam: BP 131/72 (BP Location: Left Arm)   Pulse 94   Temp 98 F (36.7 C)   Resp 17   Ht 6' (1.829 m)   Wt 99.7 kg   SpO2 98%   BMI 29.81 kg/m    Constitutional:  Alert and oriented, No acute distress. Cardiovascular: No clubbing, cyanosis, or edema. Respiratory: Normal respiratory effort, no increased work of breathing. GI: Abdomen is soft, nontender, nondistended, no abdominal masses GU: Baseball sized Right hemiscrotum with simple hydrocele by palpation + overlying scrotal wall edema. Left testicle is slightly atrophic although sized at ~12cc likely. No firm left testicular mass. Right inguinal scar noted.   Lymph: No cervical or inguinal lymphadenopathy. Skin: No rashes, bruises or suspicious lesions. Neurologic: Grossly intact, no focal deficits, moving all 4 extremities. Psychiatric: Normal mood and affect.  Laboratory Data: N/A  Pertinent Imaging: I have personally reviewed the scrotal US  (05/20/24) - simple appearing large Right hydrocele with morphologically normal Right testis. Atrophic Left testis (~3cm length) with intratesticular l hypoechogenic lesion w/ weak doppler signal measuring ~2cm.  Compared to prior US  from Oct 2024 - he did not have a Right hydrocele at that time. Left intratesticular lesion is visible but much more ill  defined and less discrete. Also with ~20% continued left atrophic testis volume loss in the interval.   IMPRESSION: 1. Interval development of a large right hydrocele containing echogenic debris. 2. Atrophic left testicle with a more focal hypoechogenic lesion on today's exam containing punctate calcification, measuring 2 x 1.2 x 1.8 cm. While this could represent more focal atrophy in the surrounding testicular parenchyma, a nonemergent pelvic MRI with iv contrast, should be considered to exclude  a developing neoplasm. Laboratory correlation also recommended. ..     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 He appears to have a new simple Right hydrocele, likely secondary to his Right inguinal hernia surgery and disruption of lymphatic drainage +/- overlying scrotal edema related to systemic anasarca. Hydrocele was not present prior to hernia surgery last year. This appears to be benign.  Left atrophic testicle does appear to have a more discrete intratesticular lesion compared to last year. Uncertain etiology, but lymphoma vs spermatocytic seminoma may occur in this age group. Benign causes such as infarct, fibrosis also possible.  I think systemic disease such as lyphoma or aggressive testicular seminoma less likely with negative CT CAP, as well as noted early lesion within testicle last year.   - Order tumor markers: bHCG, LDH, AFP  - If negative, favor interval imaging with contrasted pelvic MRI - can obtain while inpatient if he will be admitted for a couple of days, otherwise non-urgent as outpatient in the next ~2-3 weeks - Also, discussed definitive radical orchiectomy of his left atrophic testicle +/- concomitant Right hydrocele repair. This would allow definitive diagnosis and alleviate his benign contralateral hydrocele. Would defer until medical workup complete, +optimization of his fluid balance. We may continue discussion as outpatient  Penne JONELLE Skye, MD  [facility] [address] [place], [place] [address] [phone]       [1]  Allergies Allergen Reactions   Percocet [Oxycodone -Acetaminophen ] Nausea Only   "

## 2024-05-21 NOTE — Plan of Care (Signed)
" °  Problem: Clinical Measurements: Goal: Will remain free from infection Outcome: Progressing Goal: Cardiovascular complication will be avoided Outcome: Progressing   Problem: Activity: Goal: Risk for activity intolerance will decrease Outcome: Progressing   Problem: Nutrition: Goal: Adequate nutrition will be maintained Outcome: Progressing   Problem: Elimination: Goal: Will not experience complications related to bowel motility Outcome: Progressing Goal: Will not experience complications related to urinary retention Outcome: Progressing   Problem: Pain Managment: Goal: General experience of comfort will improve and/or be controlled Outcome: Progressing   Problem: Safety: Goal: Ability to remain free from injury will improve Outcome: Progressing   Problem: Skin Integrity: Goal: Risk for impaired skin integrity will decrease Outcome: Progressing   Problem: Education: Goal: Ability to describe self-care measures that may prevent or decrease complications (Diabetes Survival Skills Education) will improve Outcome: Progressing Goal: Individualized Educational Video(s) Outcome: Progressing   Problem: Coping: Goal: Ability to adjust to condition or change in health will improve Outcome: Progressing   Problem: Fluid Volume: Goal: Ability to maintain a balanced intake and output will improve Outcome: Progressing   Problem: Nutritional: Goal: Maintenance of adequate nutrition will improve Outcome: Progressing Goal: Progress toward achieving an optimal weight will improve Outcome: Progressing   Problem: Tissue Perfusion: Goal: Adequacy of tissue perfusion will improve Outcome: Progressing   "

## 2024-05-21 NOTE — Progress Notes (Deleted)
" °  Progress Note   Patient: Derek Garza FMW:[id number] DOB: Mar 03, 1961 DOA: 05/20/2024     0 DOS: the patient was seen and examined on 05/21/2024   CT scan of the abdomen just got reported with findings showing patient  having large right sided retroperitoneal bleeding extending from the upper abdomen into the right iliacus muscle. He remains hemodynamically stable at this time. I discussed with general surgeon on-call Dr. Marinda who recommends to transfuse as needed and if patient becomes hemodynamically unstable to obtain CT angio and if positive to consult interventional radiologist.  CT angio of the abdomen already requested.  Patient received a unit of blood transfusion today. Repeat H&H will be requested.  Patient signed out to overnight physician Dr. Lawence. We will continue to monitor closely  Vitals:   05/21/24 1149 05/21/24 1214 05/21/24 1239 05/21/24 1522  BP: (!) 121/56 129/66 129/64 137/77  Pulse: 83 83 77 85  Resp: 18 18 18 18   Temp: 98 F (36.7 C) 98.2 F (36.8 C) 98.2 F (36.8 C) 98.5 F (36.9 C)  TempSrc: Oral Oral Oral Oral  SpO2: 100% 99% 99% 97%  Weight:      Height:          Author: Drue ONEIDA Potter, MD 05/21/2024 7:26 PM  For on call review [email or web address].  "

## 2024-05-21 NOTE — Consult Note (Signed)
 [facility] CONSULT NOTE  Patient Care Team: Valerio Melanie [name], NP as PCP - General (Nurse Practitioner)  CHIEF COMPLAINTS/PURPOSE OF CONSULTATION: sever neutropenia/anemia-   Oncology History   No problem history exists.    HISTORY OF PRESENTING ILLNESS: Patient in sitting in the chair.  Alone  Derek Garza 63 y.o.  male pleasant patient with a history of bipolar disorder-diabetes hyperlipidemia BPH is currently admitted to hospital worsening generalized edema/leg swelling.  Patient noted to have worsening generalized swelling over the last 2 to 3 months progressively worse.  Progressive shortness of breath on exertion.  Denies any fevers or chills.  Decreased mobility because of generalized swelling weight gain.  Of note patient is on Tegretol  and Seroquel ; gabapentin -for history of bipolar disorder for the last 8 to 10 years.  Of note patient had a recent blood work with his PCP-that showed a severe neutropenia and anemia which led to evaluation in the emergency room.  Patient had scrotal ultrasound-that showed large right-sided hydrocele; left testicle-atrophic with questionable concern for malignancy.  A CT scan chest abdomen pelvis negative for any hepatosplenomegaly-lymphadenopathy.  Lab work showed severe neutropenia anemia-with normal platelet count.  Patient has been admitted to hospital for further evaluation.  Patient's previous blood counts January 2025 were within normal limits.  Patient denies any history of cirrhosis.  Denies any history of any kidney disease.    Review of Systems  Constitutional:  Positive for malaise/fatigue. Negative for chills, diaphoresis, fever and weight loss.  HENT:  Negative for nosebleeds and sore throat.   Eyes:  Negative for double vision.  Respiratory:  Positive for shortness of breath. Negative for cough, hemoptysis, sputum production and wheezing.   Cardiovascular:  Positive for leg swelling. Negative for chest pain,  palpitations and orthopnea.  Gastrointestinal:  Negative for abdominal pain, blood in stool, constipation, diarrhea, heartburn, melena, nausea and vomiting.  Genitourinary:  Negative for dysuria, frequency and urgency.  Musculoskeletal:  Negative for back pain and joint pain.  Skin: Negative.  Negative for itching and rash.  Neurological:  Negative for dizziness, tingling, focal weakness, weakness and headaches.  Endo/Heme/Allergies:  Does not bruise/bleed easily.  Psychiatric/Behavioral:  Positive for depression. The patient is nervous/anxious. The patient does not have insomnia.     MEDICAL HISTORY:  Past Medical History:  Diagnosis Date   Bipolar disorder (HCC)    Cervical myelopathy (HCC)    Diabetes mellitus without complication (HCC)    Erectile dysfunction    Gait abnormality    Hyperlipidemia    Hypertension    Neuromuscular disorder (HCC)    Paresthesia    Peripheral neuropathy    Right inguinal hernia     SURGICAL HISTORY: Past Surgical History:  Procedure Laterality Date   ANKLE SURGERY  05/29/1977   APPENDECTOMY  05/29/2009   CERVICAL SPINE SURGERY     HERNIA REPAIR  [id number]   INGUINAL HERNIA REPAIR Right 03/16/2023   Procedure: HERNIA REPAIR INGUINAL ADULT, open;  Surgeon: Marinda Jayson KIDD, MD;  Location: [facility] ORS;  Service: General;  Laterality: Right;   INSERTION OF MESH  03/16/2023   Procedure: INSERTION OF MESH;  Surgeon: Marinda Jayson KIDD, MD;  Location: [facility] ORS;  Service: General;;   SPINE SURGERY  10/2018   VASECTOMY      SOCIAL HISTORY: Social History   Socioeconomic History   Marital status: Divorced    Spouse name: Not on file   Number of children: Not on file   Years of education: Not  on file   Highest education level: Not on file  Occupational History   Occupation: on [employer]  Tobacco Use   Smoking status: Never   Smokeless tobacco: Never  Vaping Use   Vaping status: Never Used  Substance and Sexual Activity   Alcohol use: Yes     Comment: on occasion   Drug use: No   Sexual activity: Not Currently  Other Topics Concern   Not on file  Social History Narrative   Lives alone   Right Handed   Drinks 2 liter of soda daily   Social Drivers of Health   Tobacco Use: Low Risk (05/20/2024)   Patient History    Smoking Tobacco Use: Never    Smokeless Tobacco Use: Never    Passive Exposure: Not on file  Financial Resource Strain: Low Risk (07/05/2023)   Overall Financial Resource Strain (CARDIA)    Difficulty of Paying Living Expenses: Not hard at all  Food Insecurity: No Food Insecurity (05/20/2024)   Epic    Worried About Radiation Protection Practitioner of Food in the Last Year: Never true    Ran Out of Food in the Last Year: Never true  Transportation Needs: No Transportation Needs (05/20/2024)   Epic    Lack of Transportation (Medical): No    Lack of Transportation (Non-Medical): No  Physical Activity: Sufficiently Active (07/05/2023)   Exercise Vital Sign    Days of Exercise per Week: 5 days    Minutes of Exercise per Session: 30 min  Stress: No Stress Concern Present (07/05/2023)   [employer] of Occupational Health - Occupational Stress Questionnaire    Feeling of Stress : Only a little  Social Connections: Socially Isolated (07/05/2023)   Social Connection and Isolation Panel    Frequency of Communication with Friends and Family: Three times a week    Frequency of Social Gatherings with Friends and Family: Three times a week    Attends Religious Services: Never    Active Member of Clubs or Organizations: No    Attends Banker Meetings: Never    Marital Status: Divorced  Catering Manager Violence: Not At Risk (05/20/2024)   Epic    Fear of Current or Ex-Partner: No    Emotionally Abused: No    Physically Abused: No    Sexually Abused: No  Depression (PHQ2-9): Medium Risk (05/19/2024)   Depression (PHQ2-9)    PHQ-2 Score: 9  Alcohol Screen: Low Risk (07/05/2023)   Alcohol Screen    Last Alcohol Screening  Score (AUDIT): 0  Housing: Low Risk (05/20/2024)   Epic    Unable to Pay for Housing in the Last Year: No    Number of Times Moved in the Last Year: 0    Homeless in the Last Year: No  Utilities: Not At Risk (05/20/2024)   Epic    Threatened with loss of utilities: No  Health Literacy: Adequate Health Literacy (07/05/2023)   B1300 Health Literacy    Frequency of need for help with medical instructions: Never    FAMILY HISTORY: Family History  Problem Relation Age of Onset   Breast cancer Sister     ALLERGIES:  is allergic to percocet [oxycodone -acetaminophen ].  MEDICATIONS:  Current Facility-Administered Medications  Medication Dose Route Frequency Provider Last Rate Last Admin   acetaminophen  (TYLENOL ) tablet 650 mg  650 mg Oral Q6H PRN Khan, Ghalib, MD   650 mg at 05/21/24 9288   Or   acetaminophen  (TYLENOL ) suppository 650 mg  650 mg Rectal Q6H  PRN Khan, Ghalib, MD       atorvastatin  (LIPITOR) tablet 10 mg  10 mg Oral Daily Khan, Ghalib, MD       carbamazepine  (TEGRETOL  XR) 12 hr tablet 400 mg  400 mg Oral BID Hall, Carole N, DO   400 mg at 05/20/24 2355   gabapentin  (NEURONTIN ) capsule 600 mg  600 mg Oral BID Shona Terry SAILOR, DO   600 mg at 05/20/24 2353   insulin  aspart (novoLOG ) injection 0-15 Units  0-15 Units Subcutaneous TID WC Fernand Prost, MD       insulin  aspart (novoLOG ) injection 0-5 Units  0-5 Units Subcutaneous QHS Fernand Prost, MD       ondansetron  (ZOFRAN ) tablet 4 mg  4 mg Oral Q6H PRN Fernand Prost, MD       Or   ondansetron  (ZOFRAN ) injection 4 mg  4 mg Intravenous Q6H PRN Khan, Ghalib, MD   4 mg at 05/20/24 2353   QUEtiapine  (SEROQUEL ) tablet 800 mg  800 mg Oral QHS Shona Terry N, DO   800 mg at 05/20/24 2354   senna-docusate (Senokot-S) tablet 1 tablet  1 tablet Oral QHS PRN Fernand Prost, MD       sodium chloride  flush (NS) 0.9 % injection 3 mL  3 mL Intravenous Q12H Fernand Prost, MD   3 mL at 05/20/24 2105    PHYSICAL EXAMINATION:   Vitals:   05/21/24  0438 05/21/24 0723  BP: (!) 111/54 131/72  Pulse: 91 94  Resp: 16 17  Temp: 98.1 F (36.7 C) 98 F (36.7 C)  SpO2: 96% 98%   Filed Weights   05/20/24 0909 05/21/24 0500  Weight: 225 lb (102.1 kg) 219 lb 12.8 oz (99.7 kg)    Physical Exam Vitals and nursing note reviewed.  HENT:     Head: Normocephalic and atraumatic.     Mouth/Throat:     Pharynx: Oropharynx is clear.  Eyes:     Extraocular Movements: Extraocular movements intact.     Pupils: Pupils are equal, round, and reactive to light.  Cardiovascular:     Rate and Rhythm: Normal rate and regular rhythm.  Pulmonary:     Comments: Decreased breath sounds bilaterally.  Abdominal:     Palpations: Abdomen is soft.  Musculoskeletal:        General: Normal range of motion.     Cervical back: Normal range of motion.     Right lower leg: Edema present.     Left lower leg: Edema present.  Skin:    General: Skin is warm.  Neurological:     General: No focal deficit present.     Mental Status: He is alert and oriented to person, place, and time.  Psychiatric:        Behavior: Behavior normal.        Judgment: Judgment normal.     LABORATORY DATA:  I have reviewed the data as listed Lab Results  Component Value Date   WBC 1.4 (LL) 05/21/2024   HGB 6.6 (L) 05/21/2024   HCT 20.5 (L) 05/21/2024   MCV 113.9 (H) 05/21/2024   PLT 265 05/21/2024   Recent Labs    02/06/24 1039 05/19/24 1150 05/20/24 0913 05/21/24 0534  NA 135 134 135 135  K 3.9 3.5 3.1* 3.2*  CL 99 99 100 102  CO2 22 22 27 28   GLUCOSE 80 72 127* 85  BUN 11 10 11 11   CREATININE 0.74* 0.71* 0.81 0.61  CALCIUM  8.7 7.4* 7.3* 7.2*  GFRNONAA  --   --  >60 >60  PROT 6.0 4.4* 5.0*  --   ALBUMIN  3.8* 2.4* 2.4*  --   AST 31 15 19   --   ALT 28 11 11   --   ALKPHOS 129* 162* 168*  --   BILITOT 0.3 0.2 0.2  --     RADIOGRAPHIC STUDIES: I have personally reviewed the radiological images as listed and agreed with the findings in the report. CT CHEST  ABDOMEN PELVIS W CONTRAST Result Date: 05/20/2024 CLINICAL DATA:  Metastatic disease evaluation. EXAM: CT CHEST, ABDOMEN, AND PELVIS WITH CONTRAST TECHNIQUE: Multidetector CT imaging of the chest, abdomen and pelvis was performed following the standard protocol during bolus administration of intravenous contrast. RADIATION DOSE REDUCTION: This exam was performed according to the departmental dose-optimization program which includes automated exposure control, adjustment of the mA and/or kV according to patient size and/or use of iterative reconstruction technique. CONTRAST:  OMNIPAQUE  IOHEXOL  300 MG/ML  SOLN COMPARISON:  CT abdomen pelvis dated 03/17/2023. FINDINGS: CT CHEST FINDINGS Cardiovascular: There is no cardiomegaly. Small pericardial effusion. Three vessel coronary vascular calcification. Mild atherosclerotic calcification of the thoracic aorta. No aneurysmal dilatation or dissection. The origins of the great vessels of the aortic arch and the central pulmonary arteries are patent. Mediastinum/Nodes: No hilar or mediastinal adenopathy. The esophagus is grossly unremarkable no mediastinal fluid collection. Lungs/Pleura: Small left and trace right pleural effusions. There is partial compressive atelectasis of the left lower lobe. Pneumonia is not excluded. No pneumothorax. The central airways are patent. Musculoskeletal: Degenerative changes of the spine. No acute osseous pathology. 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 CT ABDOMEN PELVIS FINDINGS No intra-abdominal free air.  Small ascites. Hepatobiliary: The liver is unremarkable. No biliary dilatation. Cholecystectomy. Pancreas: Moderately atrophic pancreas. No active inflammatory changes. Spleen: Normal in size without focal abnormality. Adrenals/Urinary Tract: The adrenal glands unremarkable. Punctate nonobstructing left renal pole calculus. There is no hydronephrosis on either side. There is symmetric enhancement and excretion of  contrast by both kidneys. 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 There is diffuse colonic thickening which may be related to underdistention but concerning for colitis. Clinical correlation recommended. There is no bowel obstruction. Appendectomy. Vascular/Lymphatic: Moderate aortoiliac atherosclerotic disease. The IVC is unremarkable. No portal venous gas. There is no adenopathy. Reproductive: The prostate gland is grossly unremarkable. Other: Small fat containing bilateral inguinal hernias. Partially visualized bilateral hydroceles, right greater than left. The left testicle appears atrophic. See report for testicular ultrasound. Musculoskeletal: Osteopenia with degenerative changes of the spine. No acute osseous pathology. There is diffuse subcutaneous edema and anasarca. No fluid collection. IMPRESSION: 1. No evidence of metastatic disease in the chest, abdomen, or pelvis. 2. Small left and trace right pleural effusions with partial compressive atelectasis of the left lower lobe. Pneumonia is not excluded. 3. Small ascites and anasarca. 4. Possible colitis.  No bowel obstruction. 5. Punctate nonobstructing left renal calculus. No hydronephrosis. 6.  Aortic Atherosclerosis (ICD10-I70.0). Electronically Signed   By: Vanetta Chou M.D.   On: 05/20/2024 21:05   US  SCROTUM W/DOPPLER Result Date: 05/20/2024 EXAM: ULTRASOUND SCROTUM/TESTICLES WITH DOPPLER FLOW EVALUATION 05/20/2024 02:43:54 PM TECHNIQUE: Duplex ultrasound using B-mode/gray scaled imaging, Doppler spectral analysis and color flow Doppler was obtained of the testicles. COMPARISON: US  Scrotum 03/17/2023. CLINICAL HISTORY: Swelling of right testicle. FINDINGS: RIGHT: GREY SCALE: The right testicle measures 4.8 x 3.5 x 3.4 cm. It demonstrates normal homogeneous echotexture  without focal lesion. No testicular microlithiasis. DOPPLER EVALUATION: There is normal arterial  and venous Doppler flow within the testicle. VARICOCELE: No scrotal varicocele. SCROTAL SAC: Interval development of a large right hydrocele, containing echogenic debris. EPIDIDYMIS: No acute abnormality. LEFT: GREY SCALE: The left testicle measures 3.3 x 1.7 x 1.9 cm. There is chronic heterogeneity of the atrophic left testicle. On today's exam, there is a more focal hypoechogenic lesion containing punctate calcification, measuring 2 x 1.2 x 1.8 cm. No testicular microlithiasis. DOPPLER EVALUATION: There is normal arterial and venous Doppler flow within the testicle. VARICOCELE: No scrotal varicocele. SCROTAL SAC: No hydrocele. EPIDIDYMIS: No acute abnormality. INGUINAL REGIONS: Fat containing bilateral inguinal hernias, larger on the right than the left. IMPRESSION: 1. Interval development of a large right hydrocele containing echogenic debris. 2. Atrophic left testicle with a more focal hypoechogenic lesion on today's exam containing punctate calcification, measuring 2 x 1.2 x 1.8 cm. While this could represent more focal atrophy in the surrounding testicular parenchyma, a nonemergent pelvic MRI with iv contrast, should be considered to exclude a developing neoplasm. Laboratory correlation also recommended. . Electronically signed by: Rogelia Myers MD 05/20/2024 04:09 PM EST RP Workstation: [id number]     No problem-specific Assessment & Plan notes found for this encounter.  # 63 year old male patient with history of bipolar disorder psychiatric medication-is currently admitted to the hospital for generalized swelling including leg swelling-with severe neutropenia anemia  # Severe neutropenia/anemia- ?  Unclear etiology.  Differential diagnosis includes-toxicity from Tegretol /Seroquel .  However since patient has been stable on this medication for many years-suspect nephrotic range proteinuria/hypoalbuminemia worsening drug toxicity.  Low clinical suspicion for malignancy-given normal LDH; and also review  of smear negative for any malignancy.  However-would recommend bone marrow biopsy for further evaluation-to rule out malignancy.  Also discussed with pharmacy  # Nephrotic range proteinuria-normal renal function. ?  Question etiology.  # Bipolar disorder-patient on Tegretol /Seroquel -question causing hematologic toxicity  # Scrotal mass-low clinical suspicion of any malignancy.  # Recommendation/plan;  # Recommend Granix -injection daily given the low ANC.   # Recommend 1 unit of PRBC transfusion.  # Recommend a bone marrow biopsy-further evaluation.  Discussed with the patient pros and cons.  Will coordinate with IR  # Recommend nephrology evaluation-for further workup of significant proteinuria.  # Recommend evaluation with psychiatry-for consideration of alternative medication for his bipolar disorder.  Recommend holding Tegretol /Seroquel .  Check levels  # Thank you Dr.Djan MD for allowing me to participate in the care of your pleasant patient. Please do not hesitate to contact me with questions or concerns in the interim.   Above plan of care was discussed with patient in detail.  Also discussed with hospitalist service; nursing; pharmacy and also IR.      Cindy JONELLE Joe, MD 05/21/2024 9:52 AM

## 2024-05-21 NOTE — Progress Notes (Signed)
*  PRELIMINARY RESULTS* Echocardiogram 2D Echocardiogram has been performed.  Derek Garza 05/21/2024, 10:05 AM

## 2024-05-21 NOTE — Care Management Obs Status (Signed)
 MEDICARE OBSERVATION STATUS NOTIFICATION   Patient Details  Name: Derek Garza MRN: [id number] Date of Birth: 1960-10-24   Medicare Observation Status Notification Given:  Yes    Rojelio [name] 05/21/2024, 12:15 PM

## 2024-05-21 NOTE — Progress Notes (Signed)
 " Progress Note   Patient: Derek Garza FMW:[id number] DOB: [birth year] DOA: 05/20/2024     0 DOS: the patient was seen and examined on 05/21/2024   Brief hospital course: Derek Garza is a 63 y.o. year old male with medical history of HTN, HLD, DMII, BPH presenting to the ED with worsening scrotal swelling and fatigue.    On arrival to the ED patient was noted to be HDS stable. Lab work and imaging obtained. CBC with leukopenia, and anemia that was not previously present. MCV elevated at 117.  CMP shows hypokalemia at 3.1, decreased calcium  level, decreased protein levels and albumins. ALP elevated at 168. UA with glucosuria, and proteinuria. TSH wnl. Scrotal ultrasound shows large hydrocele with left testicle being atrophic with calcifications with some concern for malignancy. Given pts presentation, [facility] contacted for admission.  Assessment and Plan:  Anasarca Scrotal swelling rule out malignancy Patient presents with generalized weakness as well as worsening swelling in the setting of albuminemia secondary to proteinuria.   Echo in 2023 showed normal diastolic and systolic function. Monitor input and output Monitor daily weight Urologist on board Nephrology following Continue Lasix  Continue fluid restriction Follow-up on testicular tumor markers beta-hCG, LDH and AFP   Leukopenia/anemia:  Pathologist on board and considering bone marrow biopsy Monitor CBC Patient being transfused 1 unit of packed RBC today   Hypertension:  Currently relatively hypotensive We will continue to monitor off antihypertensives unless indicated   Type 2 diabetes: With neuropathy Continue insulin  and gabapentin  Monitor glucose level closely   Bipolar disorder:  Will continue home med.   Hyperlipidemia:  Continue home statin.   BPH:  Continue home tamsulosin .     VTE prophylaxis:  SCDs   Code Status:  Full Code   Family Communication: Updated at bedside   Consults called: Oncology,  urology  Subjective:  Seen and examined at bedside this morning Denies nausea vomiting chest pain Noted to have significant leukopenia As well as proteinuria Nephrology and hematology on board  Physical Exam: Gen: Middle-age male laying in bed in no acute distress CV: normal heart sounds, mild lower extremity edema Lung: CTAB Abd: No TTP, normal bowel sounds, slight abdominal distention GU: Bilateral scrotal swelling right greater than left MSK: No asymmetry, good bulk and tone Neuro: alert and oriented  Vitals:   05/21/24 1149 05/21/24 1214 05/21/24 1239 05/21/24 1522  BP: (!) 121/56 129/66 129/64 137/77  Pulse: 83 83 77 85  Resp: 18 18 18 18   Temp: 98 F (36.7 C) 98.2 F (36.8 C) 98.2 F (36.8 C) 98.5 F (36.9 C)  TempSrc: Oral Oral Oral Oral  SpO2: 100% 99% 99% 97%  Weight:      Height:        Data Reviewed: No acute abnormality found on CT abdomen    Latest Ref Rng & Units 05/21/2024    5:34 AM 05/20/2024    9:13 AM 05/19/2024   11:50 AM  CBC  WBC 4.0 - 10.5 K/uL 1.4  2.0    2.0  2.2   Hemoglobin 13.0 - 17.0 g/dL 6.6  8.0    7.9  8.0   Hematocrit 39.0 - 52.0 % 20.5  25.8    25.8  25.4   Platelets 150 - 400 K/uL 265  352    367  364        Latest Ref Rng & Units 05/21/2024    5:34 AM 05/20/2024    9:13 AM 05/19/2024   11:50 AM  BMP  Glucose 70 - 99 mg/dL 85  872  72   BUN 8 - 23 mg/dL 11  11  10    Creatinine 0.61 - 1.24 mg/dL 9.38  9.18  9.28   BUN/Creat Ratio 10 - 24   14   Sodium 135 - 145 mmol/L 135  135  134   Potassium 3.5 - 5.1 mmol/L 3.2  3.1  3.5   Chloride 98 - 111 mmol/L 102  100  99   CO2 22 - 32 mmol/L 28  27  22    Calcium  8.9 - 10.3 mg/dL 7.2  7.3  7.4       Author: Drue ONEIDA Potter, MD 05/21/2024 4:27 PM  For on call review [email or web address].  "

## 2024-05-22 DIAGNOSIS — D709 Neutropenia, unspecified: Secondary | ICD-10-CM | POA: Diagnosis present

## 2024-05-22 DIAGNOSIS — Z9049 Acquired absence of other specified parts of digestive tract: Secondary | ICD-10-CM | POA: Diagnosis not present

## 2024-05-22 DIAGNOSIS — F29 Unspecified psychosis not due to a substance or known physiological condition: Secondary | ICD-10-CM | POA: Diagnosis present

## 2024-05-22 DIAGNOSIS — N401 Enlarged prostate with lower urinary tract symptoms: Secondary | ICD-10-CM | POA: Diagnosis present

## 2024-05-22 DIAGNOSIS — E7849 Other hyperlipidemia: Secondary | ICD-10-CM | POA: Diagnosis present

## 2024-05-22 DIAGNOSIS — D539 Nutritional anemia, unspecified: Secondary | ICD-10-CM | POA: Diagnosis present

## 2024-05-22 DIAGNOSIS — N500 Atrophy of testis: Secondary | ICD-10-CM | POA: Diagnosis present

## 2024-05-22 DIAGNOSIS — E1142 Type 2 diabetes mellitus with diabetic polyneuropathy: Secondary | ICD-10-CM | POA: Diagnosis present

## 2024-05-22 DIAGNOSIS — E778 Other disorders of glycoprotein metabolism: Secondary | ICD-10-CM | POA: Diagnosis present

## 2024-05-22 DIAGNOSIS — E1121 Type 2 diabetes mellitus with diabetic nephropathy: Secondary | ICD-10-CM | POA: Diagnosis present

## 2024-05-22 DIAGNOSIS — E876 Hypokalemia: Secondary | ICD-10-CM | POA: Diagnosis present

## 2024-05-22 DIAGNOSIS — N433 Hydrocele, unspecified: Secondary | ICD-10-CM | POA: Diagnosis present

## 2024-05-22 DIAGNOSIS — R601 Generalized edema: Secondary | ICD-10-CM | POA: Diagnosis not present

## 2024-05-22 DIAGNOSIS — E8770 Fluid overload, unspecified: Secondary | ICD-10-CM | POA: Diagnosis not present

## 2024-05-22 DIAGNOSIS — Z885 Allergy status to narcotic agent status: Secondary | ICD-10-CM | POA: Diagnosis not present

## 2024-05-22 DIAGNOSIS — Z79899 Other long term (current) drug therapy: Secondary | ICD-10-CM | POA: Diagnosis not present

## 2024-05-22 DIAGNOSIS — I152 Hypertension secondary to endocrine disorders: Secondary | ICD-10-CM | POA: Diagnosis present

## 2024-05-22 DIAGNOSIS — Z7984 Long term (current) use of oral hypoglycemic drugs: Secondary | ICD-10-CM | POA: Diagnosis not present

## 2024-05-22 DIAGNOSIS — E1159 Type 2 diabetes mellitus with other circulatory complications: Secondary | ICD-10-CM | POA: Diagnosis present

## 2024-05-22 DIAGNOSIS — E1169 Type 2 diabetes mellitus with other specified complication: Secondary | ICD-10-CM | POA: Diagnosis present

## 2024-05-22 DIAGNOSIS — F3181 Bipolar II disorder: Secondary | ICD-10-CM | POA: Diagnosis present

## 2024-05-22 DIAGNOSIS — N5089 Other specified disorders of the male genital organs: Secondary | ICD-10-CM | POA: Diagnosis present

## 2024-05-22 DIAGNOSIS — E871 Hypo-osmolality and hyponatremia: Secondary | ICD-10-CM | POA: Diagnosis present

## 2024-05-22 DIAGNOSIS — F419 Anxiety disorder, unspecified: Secondary | ICD-10-CM | POA: Diagnosis present

## 2024-05-22 DIAGNOSIS — E8809 Other disorders of plasma-protein metabolism, not elsewhere classified: Secondary | ICD-10-CM | POA: Diagnosis present

## 2024-05-22 DIAGNOSIS — R3916 Straining to void: Secondary | ICD-10-CM | POA: Diagnosis present

## 2024-05-22 DIAGNOSIS — N049 Nephrotic syndrome with unspecified morphologic changes: Secondary | ICD-10-CM | POA: Diagnosis not present

## 2024-05-22 LAB — CBC WITH DIFFERENTIAL/PLATELET
Abs Immature Granulocytes: 0.09 K/uL — ABNORMAL HIGH (ref 0.00–0.07)
Basophils Absolute: 0 K/uL (ref 0.0–0.1)
Basophils Relative: 1 %
Eosinophils Absolute: 0.1 K/uL (ref 0.0–0.5)
Eosinophils Relative: 2 %
HCT: 30 % — ABNORMAL LOW (ref 39.0–52.0)
Hemoglobin: 9.5 g/dL — ABNORMAL LOW (ref 13.0–17.0)
Immature Granulocytes: 1 %
Lymphocytes Relative: 16 %
Lymphs Abs: 1.2 K/uL (ref 0.7–4.0)
MCH: 36.4 pg — ABNORMAL HIGH (ref 26.0–34.0)
MCHC: 31.7 g/dL (ref 30.0–36.0)
MCV: 114.9 fL — ABNORMAL HIGH (ref 80.0–100.0)
Monocytes Absolute: 1.1 K/uL — ABNORMAL HIGH (ref 0.1–1.0)
Monocytes Relative: 14 %
Neutro Abs: 5.2 K/uL (ref 1.7–7.7)
Neutrophils Relative %: 66 %
Platelets: 312 K/uL (ref 150–400)
RBC: 2.61 MIL/uL — ABNORMAL LOW (ref 4.22–5.81)
RDW: 19.1 % — ABNORMAL HIGH (ref 11.5–15.5)
Smear Review: NORMAL
WBC: 7.7 K/uL (ref 4.0–10.5)
nRBC: 0 % (ref 0.0–0.2)

## 2024-05-22 LAB — HAPTOGLOBIN: Haptoglobin: 90 mg/dL (ref 32–363)

## 2024-05-22 LAB — TYPE AND SCREEN
ABO/RH(D): O POS
Antibody Screen: NEGATIVE
Unit division: 0

## 2024-05-22 LAB — BASIC METABOLIC PANEL WITH GFR
Anion gap: 10 (ref 5–15)
BUN: 11 mg/dL (ref 8–23)
CO2: 25 mmol/L (ref 22–32)
Calcium: 7.7 mg/dL — ABNORMAL LOW (ref 8.9–10.3)
Chloride: 99 mmol/L (ref 98–111)
Creatinine, Ser: 0.69 mg/dL (ref 0.61–1.24)
GFR, Estimated: >60 mL/min
Glucose, Bld: 77 mg/dL (ref 70–99)
Potassium: 4.1 mmol/L (ref 3.5–5.1)
Sodium: 134 mmol/L — ABNORMAL LOW (ref 135–145)

## 2024-05-22 LAB — GLUCOSE, CAPILLARY
Glucose-Capillary: 71 mg/dL (ref 70–99)
Glucose-Capillary: 81 mg/dL (ref 70–99)
Glucose-Capillary: 109 mg/dL — ABNORMAL HIGH (ref 70–99)
Glucose-Capillary: 123 mg/dL — ABNORMAL HIGH (ref 70–99)

## 2024-05-22 LAB — C4 COMPLEMENT: Complement C4, Body Fluid: 23 mg/dL (ref 12–38)

## 2024-05-22 LAB — BETA HCG QUANT (REF LAB): hCG Quant: 3 m[IU]/mL (ref 0–3)

## 2024-05-22 LAB — ANA W/REFLEX: Anti Nuclear Antibody (ANA): NEGATIVE

## 2024-05-22 LAB — BPAM RBC
Blood Product Expiration Date: 202601242359
ISSUE DATE / TIME: 202512241218
Unit Type and Rh: 5100

## 2024-05-22 LAB — C3 COMPLEMENT: C3 Complement: 105 mg/dL (ref 82–167)

## 2024-05-22 LAB — AFP TUMOR MARKER: AFP, Serum, Tumor Marker: <1.8 ng/mL (ref 0.0–8.4)

## 2024-05-22 MED ORDER — SPIRONOLACTONE 25 MG PO TABS
25.0000 mg | ORAL_TABLET | Freq: Every day | ORAL | Status: DC
  Administered 2024-05-22 – 2024-06-02 (×12): 25 mg via ORAL
  Filled 2024-05-22 (×5): qty 1

## 2024-05-22 MED ORDER — QUETIAPINE FUMARATE 300 MG PO TABS
800.0000 mg | ORAL_TABLET | Freq: Every day | ORAL | Status: DC
  Administered 2024-05-22 – 2024-05-23 (×2): 800 mg via ORAL
  Filled 2024-05-22 (×2): qty 1

## 2024-05-22 MED ORDER — FUROSEMIDE 10 MG/ML IJ SOLN
80.0000 mg | Freq: Two times a day (BID) | INTRAMUSCULAR | Status: DC
  Administered 2024-05-22 – 2024-05-27 (×10): 80 mg via INTRAVENOUS
  Filled 2024-05-22 (×7): qty 8

## 2024-05-22 MED ORDER — CARBAMAZEPINE ER 200 MG PO TB12
400.0000 mg | ORAL_TABLET | Freq: Two times a day (BID) | ORAL | Status: DC
  Administered 2024-05-22 – 2024-05-24 (×4): 400 mg via ORAL
  Filled 2024-05-22 (×4): qty 2

## 2024-05-22 NOTE — Plan of Care (Signed)
" °  Problem: Pain Managment: Goal: General experience of comfort will improve and/or be controlled Outcome: Progressing   Problem: Safety: Goal: Ability to remain free from injury will improve Outcome: Progressing   Problem: Skin Integrity: Goal: Risk for impaired skin integrity will decrease Outcome: Progressing   Problem: Health Behavior/Discharge Planning: Goal: Ability to identify and utilize available resources and services will improve Outcome: Progressing   Problem: Nutritional: Goal: Maintenance of adequate nutrition will improve Outcome: Progressing   "

## 2024-05-22 NOTE — Progress Notes (Signed)
 " Progress Note   Patient: Derek Garza FMW:[id number] DOB: [birth year] DOA: 05/20/2024     0 DOS: the patient was seen and examined on 05/22/2024     Brief hospital course: Greene Diodato is a 63 y.o. year old male with medical history of HTN, HLD, DMII, BPH presenting to the ED with worsening scrotal swelling and fatigue.    On arrival to the ED patient was noted to be HDS stable. Lab work and imaging obtained. CBC with leukopenia, and anemia that was not previously present. MCV elevated at 117.  CMP shows hypokalemia at 3.1, decreased calcium  level, decreased protein levels and albumins. ALP elevated at 168. UA with glucosuria, and proteinuria. TSH wnl. Scrotal ultrasound shows large hydrocele with left testicle being atrophic with calcifications with some concern for malignancy. Given pts presentation, [facility] contacted for admission.   Assessment and Plan:    Scrotal swelling rule out malignancy Patient presents with generalized weakness as well as worsening swelling in the setting of albuminemia secondary to proteinuria.   Echo in 2023 showed normal diastolic and systolic function. Monitor input and output Monitor daily weight Urologist on board Nephrology following Continue IV Lasix  Continue fluid restriction Follow-up on testicular tumor markers beta-hCG, LDH and AFP   Leukopenia/anemia:  Pathologist on board and considering bone marrow biopsy Monitor CBC Patient is status post 1 unit of blood transfusion WBC have improved    Hypertension:  Currently relatively hypotensive We will continue to monitor off antihypertensives unless indicated   Type 2 diabetes: With neuropathy Continue insulin  and gabapentin  Monitor glucose level closely   Bipolar disorder:  Will continue home med.   Hyperlipidemia:  Continue home statin.   BPH:  Continue home tamsulosin .     VTE prophylaxis:  SCDs    Code Status:  Full Code   Family Communication: Updated at bedside    Consults called: Oncology, urology   Subjective:  Seen and examined at bedside this morning WBC have improved Denies chest pain nausea or vomiting   Physical Exam: Gen: Middle-age male laying in bed in no acute distress CV: normal heart sounds, mild lower extremity edema Lung: CTAB Abd: No TTP, normal bowel sounds, slight abdominal distention GU: Bilateral scrotal swelling right greater than left MSK: No asymmetry, good bulk and tone Neuro: alert and oriented    Data Reviewed:     Latest Ref Rng & Units 05/22/2024    4:57 AM 05/21/2024    5:34 AM 05/20/2024    9:13 AM  CBC  WBC 4.0 - 10.5 K/uL 7.7  1.4  2.0    2.0   Hemoglobin 13.0 - 17.0 g/dL 9.5  6.6  8.0    7.9   Hematocrit 39.0 - 52.0 % 30.0  20.5  25.8    25.8   Platelets 150 - 400 K/uL 312  265  352    367        Latest Ref Rng & Units 05/22/2024    4:57 AM 05/21/2024    5:34 AM 05/20/2024    9:13 AM  BMP  Glucose 70 - 99 mg/dL 77  85  872   BUN 8 - 23 mg/dL 11  11  11    Creatinine 0.61 - 1.24 mg/dL 9.30  9.38  9.18   Sodium 135 - 145 mmol/L 134  135  135   Potassium 3.5 - 5.1 mmol/L 4.1  3.2  3.1   Chloride 98 - 111 mmol/L 99  102  100   CO2 22 -  32 mmol/L 25  28  27    Calcium  8.9 - 10.3 mg/dL 7.7  7.2  7.3     Vitals:   05/22/24 0348 05/22/24 0500 05/22/24 0802 05/22/24 1443  BP: (!) 125/50  (!) 122/50 121/73  Pulse: 90  84 85  Resp: 15  16 16   Temp: 98.9 F (37.2 C)  98.9 F (37.2 C) 98.7 F (37.1 C)  TempSrc: Oral  Oral Oral  SpO2: 97%  95% 97%  Weight:  99.5 kg    Height:         Author: Drue ONEIDA Potter, MD 05/22/2024 4:46 PM  For on call review [email or web address].  "

## 2024-05-22 NOTE — Progress Notes (Signed)
 " [facility]  ROUNDING NOTE   Subjective:   Sitting in chair. Eating breakfast when examined. No improvement in scrotal or abdominal wall edema.   Patient states it is hard to urinate.     Objective:  Vital signs in last 24 hours:  Temp:  [98 F (36.7 C)-98.9 F (37.2 C)] 98.9 F (37.2 C) (12/25 0802) Pulse Rate:  [77-90] 84 (12/25 0802) Resp:  [15-18] 16 (12/25 0802) BP: (121-137)/(50-77) 122/50 (12/25 0802) SpO2:  [95 %-100 %] 95 % (12/25 0802) Weight:  [99.5 kg] 99.5 kg (12/25 0500)  Weight change: -2.559 kg Filed Weights   05/20/24 0909 05/21/24 0500 05/22/24 0500  Weight: 102.1 kg 99.7 kg 99.5 kg    Intake/Output: I/O last 3 completed shifts: In: 1515 [P.O.:1080; Blood:435] Out: -    Intake/Output this shift:  No intake/output data recorded.  Physical Exam: General: NAD, sitting in chair  Head: Normocephalic, atraumatic. Moist oral mucosal membranes  Eyes: Anicteric, PERRL  Neck: Supple, trachea midline  Lungs:  Clear to auscultation  Heart: Regular rate and rhythm  Abdomen:  Soft, nontender,   Extremities:  ++ peripheral edema.  Neurologic: Nonfocal, moving all four extremities  Skin: No lesions  GU +scrotal edema  Access: none    Basic Metabolic Panel: Recent Labs  Lab 05/19/24 1150 05/20/24 0913 05/20/24 1816 05/21/24 0534 05/22/24 0457  NA 134 135  --  135 134*  K 3.5 3.1*  --  3.2* 4.1  CL 99 100  --  102 99  CO2 22 27  --  28 25  GLUCOSE 72 127*  --  85 77  BUN 10 11  --  11 11  CREATININE 0.71* 0.81  --  0.61 0.69  CALCIUM  7.4* 7.3*  --  7.2* 7.7*  MG  --   --  1.9  --   --     Liver Function Tests: Recent Labs  Lab 05/19/24 1150 05/20/24 0913  AST 15 19  ALT 11 11  ALKPHOS 162* 168*  BILITOT 0.2 0.2  PROT 4.4* 5.0*  ALBUMIN 2.4* 2.4*   No results for input(s): LIPASE, AMYLASE in the last 168 hours. No results for input(s): AMMONIA in the last 168 hours.  CBC: Recent Labs  Lab 05/19/24 1150  05/20/24 0913 05/21/24 0534 05/22/24 0457  WBC 2.2* 2.0*  2.0* 1.4* 7.7  NEUTROABS 0.5* 0.8*  --  5.2  HGB 8.0* 7.9*  8.0* 6.6* 9.5*  HCT 25.4* 25.8*  25.8* 20.5* 30.0*  MCV 114* 119.4*  117.8* 113.9* 114.9*  PLT 364 367  352 265 312    Cardiac Enzymes: No results for input(s): CKTOTAL, CKMB, CKMBINDEX, TROPONINI in the last 168 hours.  BNP: Invalid input(s): POCBNP  CBG: Recent Labs  Lab 05/21/24 0735 05/21/24 1146 05/21/24 1639 05/21/24 2134 05/22/24 0803  GLUCAP 89 87 86 103* 71    Microbiology: Results for orders placed or performed in visit on 05/19/24  Microscopic Examination     Status: None   Collection Time: 05/19/24 11:35 AM   Urine  Result Value Ref Range Status   WBC, UA None seen 0 - 5 /hpf Final   RBC, Urine 0-2 0 - 2 /hpf Final   Epithelial Cells (non renal) None seen 0 - 10 /hpf Final   Bacteria, UA None seen None seen/Few Final  Urine Culture     Status: None   Collection Time: 05/19/24  1:19 PM   Specimen: Urine   UR  Result  Value Ref Range Status   Urine Culture, Routine Final report  Final   Organism ID, Bacteria Comment  Final    Comment: Mixed urogenital flora Less than 10,000 colonies/mL     Coagulation Studies: Recent Labs    05/21/24 1047  LABPROT 13.8  INR 1.0    Urinalysis: Recent Labs    05/19/24 1135 05/20/24 0913  COLORURINE  --  STRAW*  LABSPEC  --  1.006  PHURINE  --  6.0  GLUCOSEU 1+* >=500*  HGBUR  --  SMALL*  BILIRUBINUR Negative NEGATIVE  KETONESUR  --  NEGATIVE  PROTEINUR 3+* 100*  NITRITE Negative NEGATIVE  LEUKOCYTESUR Negative NEGATIVE      Imaging: MR PELVIS W WO CONTRAST Result Date: 05/22/2024 CLINICAL DATA:  Atrophic left testicle with intravesicular lesions seen on recent ultrasound. EXAM: MRI PELVIS WITHOUT AND WITH CONTRAST TECHNIQUE: Multiplanar multisequence MR imaging of the pelvis was performed both before and after administration of intravenous contrast. CONTRAST:  10mL  GADAVIST  GADOBUTROL  1 MMOL/ML IV SOLN COMPARISON:  Ultrasound exam 05/20/2024. FINDINGS: Urinary Tract:  No abnormality visualized. Bowel:  Unremarkable visualized pelvic bowel loops. Vascular/Lymphatic: No pathologically enlarged lymph nodes. No significant vascular abnormality seen. Reproductive: Prostate gland is heterogeneous but incompletely evaluated on this study. Right testicle measures 3.6 x 4.7 x 3.4 cm and is unremarkable in appearance. Right epididymal tissues are unremarkable. Moderate to large right hydrocele evident. Left testicle measures 2.7 x 2.8 x 1.8 cm and is heterogeneous in signal intensity. Circumferential capsular thickening evident. Postcontrast imaging shows irregular capsular enhancement with no overtly suspicious enhancement characteristics. No discrete intratesticular mass lesion evident. No hydrocele. Other:  Right groin hernia contains only fat. Musculoskeletal: No suspicious marrow signal abnormality. Diffuse body wall edema evident in the lower pelvis and upper thighs. IMPRESSION: 1. Left testicle is small and heterogeneous in signal intensity with circumferential capsular thickening and irregular capsular enhancement. No discrete suspicious intratesticular mass lesion evident by MRI. Imaging features are nonspecific but could be related to prior trauma or infection. Neoplasm considered less likely. Given the finding of concern is evident on sonography, follow-up scrotal ultrasound in 3 months recommended to ensure stability. 2. Moderate to large right hydrocele. 3. Right groin hernia contains only fat. 4. Diffuse body wall edema in the lower pelvis and upper thighs. Electronically Signed   By: Camellia Candle M.D.   On: 05/22/2024 07:02   ECHOCARDIOGRAM COMPLETE Result Date: 05/21/2024    ECHOCARDIOGRAM REPORT   Patient Name:   Derek Garza Date of Exam: 05/21/2024 Medical Rec #:  [id number]      Height:       72.0 in Accession #:    [id number]     Weight:       219.8 lb Date of  Birth:  May 25, 1961      BSA:          2.218 m Patient Age:    63 years       BP:           131/72 mmHg Patient Gender: M              HR:           106 bpm. Exam Location:  [facility] Procedure: 2D Echo, Cardiac Doppler, Color Doppler and Strain Analysis (Both            Spectral and Color Flow Doppler were utilized during procedure). Indications:     Anasarca  History:  Patient has no prior history of Echocardiogram examinations.                  Risk Factors:Diabetes, Hypertension and Dyslipidemia.  Sonographer:     Christopher Furnace Referring Phys:  [id number] [facility] Diagnosing Phys: Caron Poser  Sonographer Comments: Global longitudinal strain was attempted. IMPRESSIONS  1. Very technically difficult study.  2. Left ventricular ejection fraction, by estimation, is 55 to 60%. The left ventricle has normal function. Left ventricular endocardial border not optimally defined to evaluate regional wall motion. Left ventricular diastolic parameters were normal.  3. Right ventricular systolic function is normal. The right ventricular size is normal.  4. The mitral valve is normal in structure. Trivial mitral valve regurgitation. No evidence of mitral stenosis.  5. The aortic valve is tricuspid. Aortic valve regurgitation is not visualized. No aortic stenosis is present.  6. The inferior vena cava is normal in size with greater than 50% respiratory variability, suggesting right atrial pressure of 3 mmHg. Comparison(s): No prior Echocardiogram. Conclusion(s)/Recommendation(s): Normal biventricular function without evidence of hemodynamically significant valvular heart disease. FINDINGS  Left Ventricle: Left ventricular ejection fraction, by estimation, is 55 to 60%. The left ventricle has normal function. Left ventricular endocardial border not optimally defined to evaluate regional wall motion. Global longitudinal strain performed but  not reported based on interpreter judgement due to suboptimal tracking. The left  ventricular internal cavity size was normal in size. There is no left ventricular hypertrophy. Left ventricular diastolic parameters were normal. Right Ventricle: The right ventricular size is normal. Right vetricular wall thickness was not well visualized. Right ventricular systolic function is normal. Left Atrium: Left atrial size was normal in size. Right Atrium: Right atrial size was normal in size. Pericardium: Trivial pericardial effusion is present. Mitral Valve: The mitral valve is normal in structure. Trivial mitral valve regurgitation. No evidence of mitral valve stenosis. Tricuspid Valve: The tricuspid valve is normal in structure. Tricuspid valve regurgitation is trivial. No evidence of tricuspid stenosis. Aortic Valve: The aortic valve is tricuspid. Aortic valve regurgitation is not visualized. No aortic stenosis is present. Aortic valve mean gradient measures 3.0 mmHg. Aortic valve peak gradient measures 4.8 mmHg. Aortic valve area, by VTI measures 4.28 cm. Pulmonic Valve: The pulmonic valve was not well visualized. Pulmonic valve regurgitation is trivial. No evidence of pulmonic stenosis. Aorta: The aortic root is normal in size and structure. Venous: The inferior vena cava is normal in size with greater than 50% respiratory variability, suggesting right atrial pressure of 3 mmHg. IAS/Shunts: The interatrial septum was not well visualized.  LEFT VENTRICLE PLAX 2D LVIDd:         4.90 cm   Diastology LVIDs:         3.10 cm   LV e' medial:    7.51 cm/s LV PW:         1.20 cm   LV E/e' medial:  14.6 LV IVS:        0.70 cm   LV e' lateral:   10.80 cm/s LVOT diam:     2.30 cm   LV E/e' lateral: 10.2 LV SV:         90 LV SV Index:   41 LVOT Area:     4.15 cm LV IVRT:       87 msec  RIGHT VENTRICLE RV Basal diam:  3.00 cm     PULMONARY VEINS RV Mid diam:    2.90 cm     Diastolic Velocity: 49.30  cm/s RV S prime:     13.60 cm/s  S/D Velocity:       1.50 TAPSE (M-mode): 2.3 cm      Systolic Velocity:  73.40  cm/s LEFT ATRIUM             Index        RIGHT ATRIUM           Index LA diam:        4.20 cm 1.89 cm/m   RA Area:     14.80 cm LA Vol (A2C):   50.3 ml 22.68 ml/m  RA Volume:   36.30 ml  16.37 ml/m LA Vol (A4C):   50.1 ml 22.59 ml/m LA Biplane Vol: 52.7 ml 23.76 ml/m  AORTIC VALVE AV Area (Vmax):    3.47 cm AV Area (Vmean):   3.56 cm AV Area (VTI):     4.28 cm AV Vmax:           110.00 cm/s AV Vmean:          78.050 cm/s AV VTI:            0.211 m AV Peak Grad:      4.8 mmHg AV Mean Grad:      3.0 mmHg LVOT Vmax:         91.80 cm/s LVOT Vmean:        66.800 cm/s LVOT VTI:          0.217 m LVOT/AV VTI ratio: 1.03  AORTA Ao Root diam: 3.40 cm MITRAL VALVE                TRICUSPID VALVE MV Area (PHT): 6.07 cm     TR Peak grad:   12.4 mmHg MV Decel Time: 125 msec     TR Vmax:        176.00 cm/s MV E velocity: 110.00 cm/s MV A velocity: 95.40 cm/s   SHUNTS MV E/A ratio:  1.15         Systemic VTI:  0.22 m                             Systemic Diam: 2.30 cm Caron Poser Electronically signed by Caron Poser Signature Date/Time: 05/21/2024/3:16:02 PM    Final    CT CHEST ABDOMEN PELVIS W CONTRAST Result Date: 05/20/2024 CLINICAL DATA:  Metastatic disease evaluation. EXAM: CT CHEST, ABDOMEN, AND PELVIS WITH CONTRAST TECHNIQUE: Multidetector CT imaging of the chest, abdomen and pelvis was performed following the standard protocol during bolus administration of intravenous contrast. RADIATION DOSE REDUCTION: This exam was performed according to the departmental dose-optimization program which includes automated exposure control, adjustment of the mA and/or kV according to patient size and/or use of iterative reconstruction technique. CONTRAST:  OMNIPAQUE  IOHEXOL  300 MG/ML  SOLN COMPARISON:  CT abdomen pelvis dated 03/17/2023. FINDINGS: CT CHEST FINDINGS Cardiovascular: There is no cardiomegaly. Small pericardial effusion. Three vessel coronary vascular calcification. Mild atherosclerotic calcification  of the thoracic aorta. No aneurysmal dilatation or dissection. The origins of the great vessels of the aortic arch and the central pulmonary arteries are patent. Mediastinum/Nodes: No hilar or mediastinal adenopathy. The esophagus is grossly unremarkable no mediastinal fluid collection. Lungs/Pleura: Small left and trace right pleural effusions. There is partial compressive atelectasis of the left lower lobe. Pneumonia is not excluded. No pneumothorax. The central airways are patent. Musculoskeletal: Degenerative changes of the spine. No acute osseous pathology. 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 CT ABDOMEN PELVIS FINDINGS No intra-abdominal free air.  Small ascites. Hepatobiliary: The liver is unremarkable. No biliary dilatation. Cholecystectomy. Pancreas: Moderately atrophic pancreas. No active inflammatory changes. Spleen: Normal in size without focal abnormality. Adrenals/Urinary Tract: The adrenal glands unremarkable. Punctate nonobstructing left renal pole calculus. There is no hydronephrosis on either side. There is symmetric enhancement and excretion of contrast by both kidneys. 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 There is diffuse colonic thickening which may be related to underdistention but concerning for colitis. Clinical correlation recommended. There is no bowel obstruction. Appendectomy. Vascular/Lymphatic: Moderate aortoiliac atherosclerotic disease. The IVC is unremarkable. No portal venous gas. There is no adenopathy. Reproductive: The prostate gland is grossly unremarkable. Other: Small fat containing bilateral inguinal hernias. Partially visualized bilateral hydroceles, right greater than left. The left testicle appears atrophic. See report for testicular ultrasound. Musculoskeletal: Osteopenia with degenerative changes of the spine. No acute osseous pathology. There is diffuse subcutaneous edema and  anasarca. No fluid collection. IMPRESSION: 1. No evidence of metastatic disease in the chest, abdomen, or pelvis. 2. Small left and trace right pleural effusions with partial compressive atelectasis of the left lower lobe. Pneumonia is not excluded. 3. Small ascites and anasarca. 4. Possible colitis.  No bowel obstruction. 5. Punctate nonobstructing left renal calculus. No hydronephrosis. 6.  Aortic Atherosclerosis (ICD10-I70.0). Electronically Signed   By: Vanetta Chou M.D.   On: 05/20/2024 21:05   US  SCROTUM W/DOPPLER Result Date: 05/20/2024 EXAM: ULTRASOUND SCROTUM/TESTICLES WITH DOPPLER FLOW EVALUATION 05/20/2024 02:43:54 PM TECHNIQUE: Duplex ultrasound using B-mode/gray scaled imaging, Doppler spectral analysis and color flow Doppler was obtained of the testicles. COMPARISON: US  Scrotum 03/17/2023. CLINICAL HISTORY: Swelling of right testicle. FINDINGS: RIGHT: GREY SCALE: The right testicle measures 4.8 x 3.5 x 3.4 cm. It demonstrates normal homogeneous echotexture without focal lesion. No testicular microlithiasis. DOPPLER EVALUATION: There is normal arterial and venous Doppler flow within the testicle. VARICOCELE: No scrotal varicocele. SCROTAL SAC: Interval development of a large right hydrocele, containing echogenic debris. EPIDIDYMIS: No acute abnormality. LEFT: GREY SCALE: The left testicle measures 3.3 x 1.7 x 1.9 cm. There is chronic heterogeneity of the atrophic left testicle. On today's exam, there is a more focal hypoechogenic lesion containing punctate calcification, measuring 2 x 1.2 x 1.8 cm. No testicular microlithiasis. DOPPLER EVALUATION: There is normal arterial and venous Doppler flow within the testicle. VARICOCELE: No scrotal varicocele. SCROTAL SAC: No hydrocele. EPIDIDYMIS: No acute abnormality. INGUINAL REGIONS: Fat containing bilateral inguinal hernias, larger on the right than the left. IMPRESSION: 1. Interval development of a large right hydrocele containing echogenic debris.  2. Atrophic left testicle with a more focal hypoechogenic lesion on today's exam containing punctate calcification, measuring 2 x 1.2 x 1.8 cm. While this could represent more focal atrophy in the surrounding testicular parenchyma, a nonemergent pelvic MRI with iv contrast, should be considered to exclude a developing neoplasm. Laboratory correlation also recommended. . Electronically signed by: Rogelia Myers MD 05/20/2024 04:09 PM EST RP Workstation: [id number]     Medications:     atorvastatin   10 mg Oral Daily   filgrastim  (NIVESTYM ) SQ  480 mcg Subcutaneous Q2000   furosemide   80 mg Intravenous BID   gabapentin   600 mg Oral BID   insulin  aspart  0-15 Units Subcutaneous TID WC   insulin  aspart  0-5 Units Subcutaneous QHS   QUEtiapine   400 mg Oral QHS   sodium chloride  flush  3  mL Intravenous Q12H   spironolactone   25 mg Oral Daily   acetaminophen  **OR** acetaminophen , ondansetron  **OR** ondansetron  (ZOFRAN ) IV, oxyCODONE , senna-docusate  Assessment/ Plan:  Derek Garza is a 63 y.o.  male with hypertension, hyperlipidemia, diabetes mellitus type II, and BPH, who was admitted to [facility] on 05/20/2024 for Anasarca [R60.1] Scrotal swelling [N50.89] Anemia, unspecified type [D64.9] Leukopenia, unspecified type [D72.819]   Anasarca with nephrotic range proteinuria Hypertension Diabetes mellitus type II with renal manifestations Hypokalemia   Differential includes acute glomerulopathy, myeloplastic disease, progressive diabetic nephropathy, hepatic cirrhosis or congestive heart failure.  - serologic work up pending. Serum complements normal. ANA negative.  - increase furosemide  to 80 mg IV bid - start spironolactone  25mg  daily - holding empagliflozin  - patient may need renal biopsy.  - appreciate hematology input.    LOS: 0 Samrat Hayward 12/25/202511:08 AM   "

## 2024-05-23 ENCOUNTER — Inpatient Hospital Stay: Admitting: Radiology

## 2024-05-23 DIAGNOSIS — R601 Generalized edema: Secondary | ICD-10-CM | POA: Diagnosis not present

## 2024-05-23 HISTORY — PX: IR BONE MARROW BIOPSY & ASPIRATION: IMG5727

## 2024-05-23 LAB — CBC WITH DIFFERENTIAL/PLATELET
Abs Immature Granulocytes: 0.04 K/uL (ref 0.00–0.07)
Abs Immature Granulocytes: 0.02 K/uL (ref 0.00–0.07)
Basophils Absolute: 0 K/uL (ref 0.0–0.1)
Basophils Absolute: 0 K/uL (ref 0.0–0.1)
Basophils Relative: 1 %
Basophils Relative: 1 %
Eosinophils Absolute: 0.2 K/uL (ref 0.0–0.5)
Eosinophils Absolute: 0.3 K/uL (ref 0.0–0.5)
Eosinophils Relative: 3 %
Eosinophils Relative: 4 %
HCT: 27.7 % — ABNORMAL LOW (ref 39.0–52.0)
HCT: 30.2 % — ABNORMAL LOW (ref 39.0–52.0)
Hemoglobin: 8.8 g/dL — ABNORMAL LOW (ref 13.0–17.0)
Hemoglobin: 9.7 g/dL — ABNORMAL LOW (ref 13.0–17.0)
Immature Granulocytes: 1 %
Immature Granulocytes: 0 %
Lymphocytes Relative: 16 %
Lymphocytes Relative: 21 %
Lymphs Abs: 1.2 K/uL (ref 0.7–4.0)
Lymphs Abs: 1.5 K/uL (ref 0.7–4.0)
MCH: 35.6 pg — ABNORMAL HIGH (ref 26.0–34.0)
MCH: 35.5 pg — ABNORMAL HIGH (ref 26.0–34.0)
MCHC: 31.8 g/dL (ref 30.0–36.0)
MCHC: 32.1 g/dL (ref 30.0–36.0)
MCV: 112.1 fL — ABNORMAL HIGH (ref 80.0–100.0)
MCV: 110.6 fL — ABNORMAL HIGH (ref 80.0–100.0)
Monocytes Absolute: 1.1 K/uL — ABNORMAL HIGH (ref 0.1–1.0)
Monocytes Absolute: 1.1 K/uL — ABNORMAL HIGH (ref 0.1–1.0)
Monocytes Relative: 15 %
Monocytes Relative: 15 %
Neutro Abs: 4.8 K/uL (ref 1.7–7.7)
Neutro Abs: 4.4 K/uL (ref 1.7–7.7)
Neutrophils Relative %: 64 %
Neutrophils Relative %: 59 %
Platelets: 330 K/uL (ref 150–400)
Platelets: 374 K/uL (ref 150–400)
RBC: 2.47 MIL/uL — ABNORMAL LOW (ref 4.22–5.81)
RBC: 2.73 MIL/uL — ABNORMAL LOW (ref 4.22–5.81)
RDW: 18.5 % — ABNORMAL HIGH (ref 11.5–15.5)
RDW: 18.6 % — ABNORMAL HIGH (ref 11.5–15.5)
Smear Review: NORMAL
Smear Review: NORMAL
WBC: 7.3 K/uL (ref 4.0–10.5)
WBC: 7.4 K/uL (ref 4.0–10.5)
nRBC: 0 % (ref 0.0–0.2)
nRBC: 0 % (ref 0.0–0.2)

## 2024-05-23 LAB — GLUCOSE, CAPILLARY
Glucose-Capillary: 90 mg/dL (ref 70–99)
Glucose-Capillary: 246 mg/dL — ABNORMAL HIGH (ref 70–99)
Glucose-Capillary: 117 mg/dL — ABNORMAL HIGH (ref 70–99)
Glucose-Capillary: 93 mg/dL (ref 70–99)

## 2024-05-23 LAB — HEPATITIS B CORE ANTIBODY, TOTAL: HEP B CORE AB: NEGATIVE

## 2024-05-23 LAB — ANCA PROFILE
Anti-MPO Antibodies: <0.2 U (ref 0.0–0.9)
Anti-PR3 Antibodies: <0.2 U (ref 0.0–0.9)
Atypical P-ANCA titer: <1:20 {titer}
C-ANCA: <1:20 {titer}
P-ANCA: <1:20 {titer}

## 2024-05-23 LAB — KAPPA/LAMBDA LIGHT CHAINS
Kappa free light chain: 45.1 mg/L — ABNORMAL HIGH (ref 3.3–19.4)
Kappa, lambda light chain ratio: 1.02 (ref 0.26–1.65)
Lambda free light chains: 44.4 mg/L — ABNORMAL HIGH (ref 5.7–26.3)

## 2024-05-23 LAB — GLOMERULAR BASEMENT MEMBRANE ANTIBODIES: GBM Ab: <0.2 U (ref 0.0–0.9)

## 2024-05-23 MED ORDER — MIDAZOLAM HCL 2 MG/2ML IJ SOLN
INTRAMUSCULAR | Status: AC
  Filled 2024-05-23: qty 2

## 2024-05-23 MED ORDER — FENTANYL CITRATE (PF) 100 MCG/2ML IJ SOLN
INTRAMUSCULAR | Status: AC | PRN
  Administered 2024-05-23: 50 ug via INTRAVENOUS

## 2024-05-23 MED ORDER — HEPARIN SOD (PORK) LOCK FLUSH 100 UNIT/ML IV SOLN
INTRAVENOUS | Status: AC
  Filled 2024-05-23: qty 5

## 2024-05-23 MED ORDER — LIDOCAINE 1 % OPTIME INJ - NO CHARGE
10.0000 mL | Freq: Once | INTRAMUSCULAR | Status: AC
  Administered 2024-05-23: 10 mL via INTRADERMAL
  Filled 2024-05-23: qty 10

## 2024-05-23 MED ORDER — FENTANYL CITRATE (PF) 100 MCG/2ML IJ SOLN
INTRAMUSCULAR | Status: AC
  Filled 2024-05-23: qty 2

## 2024-05-23 MED ORDER — MIDAZOLAM HCL (PF) 2 MG/2ML IJ SOLN
INTRAMUSCULAR | Status: AC | PRN
  Administered 2024-05-23: 1 mg via INTRAVENOUS

## 2024-05-23 NOTE — Procedures (Signed)
" °  Procedure:  FLuoro guided L iliac bone marrow biopsy   Preprocedure diagnosis: The primary encounter diagnosis was Scrotal swelling. Diagnoses of Anemia, unspecified type, Leukopenia, unspecified type, and Severe neutropenia were also pertinent to this visit. Postprocedure diagnosis: same EBL:    minimal Complications:   none immediate  See full dictation in [employer].  CHARM Toribio Faes MD Main # [phone] Pager  [phone] Mobile [phone]    "

## 2024-05-23 NOTE — Progress Notes (Signed)
 Patient clinically stable post IR BMB per Dr Hassell,tolerated well. Vitals stable post procedure. Received Versed  2 mg along with Fentanyl  50 mcg IV for procedure. Report given to Carlyon Louder RN post procedure./specials/18

## 2024-05-23 NOTE — TOC CM/SW Note (Signed)
 Transition of Care [facility]) CM/SW Note    Transition of Care [facility]) - Inpatient Brief Assessment   Patient Details  Name: Derek Garza MRN: [id number] Date of Birth: 05/21/1961  Transition of Care [facility]) CM/SW Contact:    Alfonso Rummer, LCSW Phone Number: 05/23/2024, 1:53 PM   Clinical Narrative:  Completed toc chart review no toc needs identified please contact toc should needs arise.   Transition of Care Asessment: Insurance and Status: Insurance coverage has been reviewed Patient has primary care physician: Yes (CANNADY, JOLENE T) Home environment has been reviewed: single family home Prior level of function:: independent Prior/Current Home Services: No current home services   Readmission risk has been reviewed: No Transition of care needs: no transition of care needs at this time

## 2024-05-23 NOTE — Progress Notes (Signed)
 " Progress Note   Patient: Derek Garza FMW:[id number] DOB: 01-Feb-1961 DOA: 05/20/2024     1 DOS: the patient was seen and examined on 05/23/2024     Brief hospital course: Derek Garza is a 63 y.o. year old male with medical history of HTN, HLD, DMII, BPH presenting to the ED with worsening scrotal swelling and fatigue.    On arrival to the ED patient was noted to be HDS stable. Lab work and imaging obtained. CBC with leukopenia, and anemia that was not previously present. MCV elevated at 117.  CMP shows hypokalemia at 3.1, decreased calcium  level, decreased protein levels and albumins. ALP elevated at 168. UA with glucosuria, and proteinuria. TSH wnl. Scrotal ultrasound shows large hydrocele with left testicle being atrophic with calcifications with some concern for malignancy. Given pts presentation, [facility] contacted for admission.   Assessment and Plan:     Scrotal swelling rule out malignancy Anasarca with nephrotic range proteinuria Patient presents with generalized weakness as well as worsening swelling in the setting of albuminemia secondary to proteinuria.   Echo in 2023 showed normal diastolic and systolic function. Monitor input and output Monitor daily weight Urologist on board Nephrology following Nephrologist recommending high-dose IV Lasix  Continue fluid restriction Follow-up on testicular tumor markers beta-hCG, LDH and AFP   Leukopenia/anemia:  Pathologist on board and considering bone marrow biopsy Monitor CBC Patient is status post 1 unit of blood transfusion WBC have improved     Hypertension:  Currently relatively hypotensive We will continue to monitor off antihypertensives unless indicated   Type 2 diabetes: With neuropathy Continue insulin  and gabapentin  Monitor glucose level closely   Bipolar disorder:  Will continue home med.   Hyperlipidemia:  Continue home statin.   BPH:  Continue home tamsulosin .     VTE prophylaxis:  SCDs    Code  Status:  Full Code   Family Communication: Updated at bedside   Consults called: Oncology, urology   Subjective:  Seen and examined at bedside this morning Underwent bone marrow biopsy today WBC improved   Physical Exam: Gen: Middle-age male laying in bed in no acute distress CV: normal heart sounds, mild lower extremity edema Lung: CTAB Abd: No TTP, normal bowel sounds, slight abdominal distention GU: Bilateral scrotal swelling right greater than left MSK: No asymmetry, good bulk and tone Neuro: alert and oriented     Data Reviewed:      Latest Ref Rng & Units 05/23/2024   11:17 AM 05/23/2024    5:46 AM 05/22/2024    4:57 AM  CBC  WBC 4.0 - 10.5 K/uL 7.4  7.3  7.7   Hemoglobin 13.0 - 17.0 g/dL 9.7  8.8  9.5   Hematocrit 39.0 - 52.0 % 30.2  27.7  30.0   Platelets 150 - 400 K/uL 374  330  312        Latest Ref Rng & Units 05/22/2024    4:57 AM 05/21/2024    5:34 AM 05/20/2024    9:13 AM  BMP  Glucose 70 - 99 mg/dL 77  85  872   BUN 8 - 23 mg/dL 11  11  11    Creatinine 0.61 - 1.24 mg/dL 9.30  9.38  9.18   Sodium 135 - 145 mmol/L 134  135  135   Potassium 3.5 - 5.1 mmol/L 4.1  3.2  3.1   Chloride 98 - 111 mmol/L 99  102  100   CO2 22 - 32 mmol/L 25  28  27  Calcium  8.9 - 10.3 mg/dL 7.7  7.2  7.3      Vitals:   05/23/24 1015 05/23/24 1026 05/23/24 1045 05/23/24 1549  BP: (!) 131/56 (!) 129/54 (!) 143/80 122/63  Pulse: 84 81 84 79  Resp: 17 18 18 16   Temp:  (!) 97.4 F (36.3 C) (!) 97.4 F (36.3 C) 98.4 F (36.9 C)  TempSrc:  Temporal Oral   SpO2: 100% 97% 96% 99%  Weight:      Height:         Author: Drue ONEIDA Potter, MD 05/23/2024 5:47 PM  For on call review [email or web address].  "

## 2024-05-23 NOTE — Plan of Care (Signed)

## 2024-05-23 NOTE — Progress Notes (Signed)
 Derek Garza   DOB:09-02-1960   FM#:[id number]    Subjective: Patient status post bone marrow biopsy this morning.  States he has been urinating a lot since being on diuretics.  Noticed to have some improvement of his leg swelling and abdominal distention and also scrotal swelling.  Denies any fevers or chills.  No nausea no vomiting.  No bleeding.  Objective:  Vitals:   05/23/24 1549 05/23/24 2113  BP: 122/63 137/80  Pulse: 79 80  Resp: 16 18  Temp: 98.4 F (36.9 C) 97.8 F (36.6 C)  SpO2: 99% 98%     Intake/Output Summary (Last 24 hours) at 05/23/2024 2241 Last data filed at 05/23/2024 1909 Gross per 24 hour  Intake 240 ml  Output --  Net 240 ml    Physical Exam Vitals and nursing note reviewed.  HENT:     Head: Normocephalic and atraumatic.     Mouth/Throat:     Pharynx: Oropharynx is clear.  Eyes:     Extraocular Movements: Extraocular movements intact.     Pupils: Pupils are equal, round, and reactive to light.  Cardiovascular:     Rate and Rhythm: Normal rate and regular rhythm.  Pulmonary:     Comments: Decreased breath sounds bilaterally.  Abdominal:     Palpations: Abdomen is soft.  Musculoskeletal:        General: Normal range of motion.     Cervical back: Normal range of motion.  Skin:    General: Skin is warm.  Neurological:     General: No focal deficit present.     Mental Status: He is alert and oriented to person, place, and time.  Psychiatric:        Behavior: Behavior normal.        Judgment: Judgment normal.      Labs:  Lab Results  Component Value Date   WBC 7.4 05/23/2024   HGB 9.7 (L) 05/23/2024   HCT 30.2 (L) 05/23/2024   MCV 110.6 (H) 05/23/2024   PLT 374 05/23/2024   NEUTROABS 4.4 05/23/2024    Lab Results  Component Value Date   NA 134 (L) 05/22/2024   K 4.1 05/22/2024   CL 99 05/22/2024   CO2 25 05/22/2024    Studies:  IR BONE MARROW BIOPSY & ASPIRATION Result Date: 05/23/2024 EXAM: Fluoroscopic guided bone marrow  biopsy COMPARISON: None. CLINICAL HISTORY: Indication / Diagnosis: Anemia, unspecified type, Leukopenia, unspecified type, and Severe neutropenia. Additional clinical history: Anemia, unspecified type, Leukopenia, unspecified type, and Severe neutropenia. Previous biopsy of same target: No. Complications: No immediate complications. Conscious Sedation: 1 mg Versed  IV, 50 mcg fentanyl  IV. Sedation Technique: Under physician supervision, Versed  and fentanyl  were administered IV for moderate sedation. Pulse oximetry, heart rate, and blood pressure were continuously monitored with an independent trained observer present. Physician face-to-face sedation time: 10 minutes. Discussion: Time out performed including verification of patient, date of birth, procedure, and target as appropriate. Informed written consent was obtained. The site was prepared and draped using maximal sterile barrier technique including cutaneous antisepsis. The patient was positioned prone. Initial imaging was performed. Biopsy target: Organ or target location: Left iliac wing. Laterality: Left. Local anesthesia was administered. Under image guidance, the biopsy needle was advanced to the target and biopsy was performed. Biopsy details: Guidance modality: Fluoroscopy. Biopsy type: Core / Aspiration. Coaxial needle: Not specified. Core needle size: Not specified. Number of core specimens: Not specified. Aspiration performed: Not specified. On-site assessment of biopsy adequacy: Not specified. Preliminary assessment of  sample adequacy: Not specified. The biopsy needle was removed and a sterile dressing was applied. Tract closure: None. Post-procedure imaging: Immediate post-biopsy imaging: Not performed. Estimated blood loss: Less than 10 mL. Specimens sent for evaluation. IMPRESSION: 1. Technically successful fluoroscopy-guided bone marrow biopsy of the left iliac wing. 2. No immediate complications. Electronically signed by: Katheleen Faes MD  05/23/2024 11:27 AM EST RP Workstation: [id number]    No problem-specific Assessment & Plan notes found for this encounter.  # 63 year old male patient with history of bipolar disorder psychiatric medication-is currently admitted to the hospital for generalized swelling including leg swelling-with severe neutropenia anemia   # Severe neutropenia/anemia- ?  Unclear etiology.  Differential diagnosis includes-toxicity from Tegretol /Seroquel -enhanced toxicity from hypoalbuminemia.  Currently status post bone marrow biopsy- pending results.  Again discussed with the patient that clinical suspicion for malignancy is low.  Patient status post Granix  1 dose-improved and ANC count improved hemoglobin status post 1 unit of PRBC transfusion.    # Nephrotic range proteinuria-normal renal function.  Unclear etiology.  Status post evaluation with nephrology currently on diuretics   # Bipolar disorder-patient on Tegretol /Seroquel -question causing hematologic toxicity-defer to primary service/psychiatry for further management.   Cindy JONELLE Joe, MD 05/23/2024  10:41 PM

## 2024-05-24 DIAGNOSIS — R601 Generalized edema: Secondary | ICD-10-CM | POA: Diagnosis not present

## 2024-05-24 LAB — BASIC METABOLIC PANEL WITH GFR
Anion gap: 6 (ref 5–15)
BUN: 13 mg/dL (ref 8–23)
CO2: 28 mmol/L (ref 22–32)
Calcium: 7.7 mg/dL — ABNORMAL LOW (ref 8.9–10.3)
Chloride: 98 mmol/L (ref 98–111)
Creatinine, Ser: 0.78 mg/dL (ref 0.61–1.24)
GFR, Estimated: >60 mL/min
Glucose, Bld: 177 mg/dL — ABNORMAL HIGH (ref 70–99)
Potassium: 3.6 mmol/L (ref 3.5–5.1)
Sodium: 133 mmol/L — ABNORMAL LOW (ref 135–145)

## 2024-05-24 LAB — CBC WITH DIFFERENTIAL/PLATELET
Abs Immature Granulocytes: 0.03 K/uL (ref 0.00–0.07)
Basophils Absolute: 0 K/uL (ref 0.0–0.1)
Basophils Relative: 1 %
Eosinophils Absolute: 0.3 K/uL (ref 0.0–0.5)
Eosinophils Relative: 5 %
HCT: 28.7 % — ABNORMAL LOW (ref 39.0–52.0)
Hemoglobin: 9 g/dL — ABNORMAL LOW (ref 13.0–17.0)
Immature Granulocytes: 1 %
Lymphocytes Relative: 27 %
Lymphs Abs: 1.4 K/uL (ref 0.7–4.0)
MCH: 35.7 pg — ABNORMAL HIGH (ref 26.0–34.0)
MCHC: 31.4 g/dL (ref 30.0–36.0)
MCV: 113.9 fL — ABNORMAL HIGH (ref 80.0–100.0)
Monocytes Absolute: 0.8 K/uL (ref 0.1–1.0)
Monocytes Relative: 17 %
Neutro Abs: 2.5 K/uL (ref 1.7–7.7)
Neutrophils Relative %: 49 %
Platelets: 325 K/uL (ref 150–400)
RBC: 2.52 MIL/uL — ABNORMAL LOW (ref 4.22–5.81)
RDW: 18.2 % — ABNORMAL HIGH (ref 11.5–15.5)
WBC: 5.1 K/uL (ref 4.0–10.5)
nRBC: 0 % (ref 0.0–0.2)

## 2024-05-24 LAB — GLUCOSE, CAPILLARY
Glucose-Capillary: 210 mg/dL — ABNORMAL HIGH (ref 70–99)
Glucose-Capillary: 81 mg/dL (ref 70–99)
Glucose-Capillary: 145 mg/dL — ABNORMAL HIGH (ref 70–99)
Glucose-Capillary: 77 mg/dL (ref 70–99)
Glucose-Capillary: 124 mg/dL — ABNORMAL HIGH (ref 70–99)

## 2024-05-24 LAB — ALBUMIN: Albumin: 2.3 g/dL — ABNORMAL LOW (ref 3.5–5.0)

## 2024-05-24 MED ORDER — CARBAMAZEPINE 200 MG PO TABS
200.0000 mg | ORAL_TABLET | Freq: Once | ORAL | Status: DC
  Filled 2024-05-24: qty 1

## 2024-05-24 MED ORDER — ALBUMIN HUMAN 25 % IV SOLN
25.0000 g | Freq: Once | INTRAVENOUS | Status: AC
  Administered 2024-05-24: 25 g via INTRAVENOUS
  Filled 2024-05-24: qty 100

## 2024-05-24 MED ORDER — CARBAMAZEPINE 200 MG PO TABS
400.0000 mg | ORAL_TABLET | Freq: Once | ORAL | Status: AC
  Administered 2024-05-24: 400 mg via ORAL
  Filled 2024-05-24: qty 2

## 2024-05-24 MED ORDER — QUETIAPINE FUMARATE 300 MG PO TABS
800.0000 mg | ORAL_TABLET | Freq: Once | ORAL | Status: AC
  Administered 2024-05-24: 800 mg via ORAL
  Filled 2024-05-24: qty 1

## 2024-05-24 NOTE — Plan of Care (Signed)

## 2024-05-24 NOTE — Progress Notes (Signed)
 " [facility]  ROUNDING NOTE   Subjective:   Patient presents in chair. No acute, distress noted on room air. Endorses improved edema overnight. Reports good appetite. No complaints.   Objective:  Vital signs in last 24 hours:  Temp:  [97.4 F (36.3 C)-98.4 F (36.9 C)] 98.1 F (36.7 C) (12/27 1016) Pulse Rate:  [78-104] 78 (12/27 1016) Resp:  [16-19] 16 (12/27 1016) BP: (107-143)/(54-90) 128/90 (12/27 1016) SpO2:  [96 %-99 %] 96 % (12/27 1016) Weight:  [98.1 kg] 98.1 kg (12/27 0500)  Weight change:  Filed Weights   05/21/24 0500 05/22/24 0500 05/24/24 0500  Weight: 99.7 kg 99.5 kg 98.1 kg    Intake/Output: I/O last 3 completed shifts: In: 840 [P.O.:840] Out: -    Intake/Output this shift:  No intake/output data recorded.  Physical Exam: General: NAD, sitting in chair  Head: Normocephalic  Eyes: Anicteric  Neck: Supple  Lungs:  Clear to auscultation, on room air  Heart: Regular rate  Abdomen:  Soft, nontender,   Extremities:  ++ peripheral edema.  Neurologic: Alert, conversant  Skin: No lesions  GU scrotal edema  Access: none    Basic Metabolic Panel: Recent Labs  Lab 05/19/24 1150 05/20/24 0913 05/20/24 1816 05/21/24 0534 05/22/24 0457 05/24/24 0513  NA 134 135  --  135 134* 133*  K 3.5 3.1*  --  3.2* 4.1 3.6  CL 99 100  --  102 99 98  CO2 22 27  --  28 25 28   GLUCOSE 72 127*  --  85 77 177*  BUN 10 11  --  11 11 13   CREATININE 0.71* 0.81  --  0.61 0.69 0.78  CALCIUM  7.4* 7.3*  --  7.2* 7.7* 7.7*  MG  --   --  1.9  --   --   --     Liver Function Tests: Recent Labs  Lab 05/19/24 1150 05/20/24 0913 05/24/24 0859  AST 15 19  --   ALT 11 11  --   ALKPHOS 162* 168*  --   BILITOT 0.2 0.2  --   PROT 4.4* 5.0*  --   ALBUMIN  2.4* 2.4* 2.3*   No results for input(s): LIPASE, AMYLASE in the last 168 hours. No results for input(s): AMMONIA in the last 168 hours.  CBC: Recent Labs  Lab 05/20/24 0913 05/21/24 0534  05/22/24 0457 05/23/24 0546 05/23/24 1117 05/24/24 0513  WBC 2.0*  2.0* 1.4* 7.7 7.3 7.4 5.1  NEUTROABS 0.8*  --  5.2 4.8 4.4 2.5  HGB 7.9*  8.0* 6.6* 9.5* 8.8* 9.7* 9.0*  HCT 25.8*  25.8* 20.5* 30.0* 27.7* 30.2* 28.7*  MCV 119.4*  117.8* 113.9* 114.9* 112.1* 110.6* 113.9*  PLT 367  352 265 312 330 374 325    Cardiac Enzymes: No results for input(s): CKTOTAL, CKMB, CKMBINDEX, TROPONINI in the last 168 hours.  BNP: Invalid input(s): POCBNP  CBG: Recent Labs  Lab 05/23/24 0809 05/23/24 1151 05/23/24 1550 05/23/24 2117 05/24/24 1018  GLUCAP 90 246* 117* 93 210*    Microbiology: Results for orders placed or performed in visit on 05/19/24  Microscopic Examination     Status: None   Collection Time: 05/19/24 11:35 AM   Urine  Result Value Ref Range Status   WBC, UA None seen 0 - 5 /hpf Final   RBC, Urine 0-2 0 - 2 /hpf Final   Epithelial Cells (non renal) None seen 0 - 10 /hpf Final   Bacteria, UA None seen  None seen/Few Final  Urine Culture     Status: None   Collection Time: 05/19/24  1:19 PM   Specimen: Urine   UR  Result Value Ref Range Status   Urine Culture, Routine Final report  Final   Organism ID, Bacteria Comment  Final    Comment: Mixed urogenital flora Less than 10,000 colonies/mL     Coagulation Studies: Recent Labs    05/21/24 1047  LABPROT 13.8  INR 1.0    Urinalysis: No results for input(s): COLORURINE, LABSPEC, PHURINE, GLUCOSEU, HGBUR, BILIRUBINUR, KETONESUR, PROTEINUR, UROBILINOGEN, NITRITE, LEUKOCYTESUR in the last 72 hours.  Invalid input(s): APPERANCEUR     Imaging: IR BONE MARROW BIOPSY & ASPIRATION Result Date: 05/23/2024 EXAM: Fluoroscopic guided bone marrow biopsy COMPARISON: None. CLINICAL HISTORY: Indication / Diagnosis: Anemia, unspecified type, Leukopenia, unspecified type, and Severe neutropenia. Additional clinical history: Anemia, unspecified type, Leukopenia, unspecified type, and  Severe neutropenia. Previous biopsy of same target: No. Complications: No immediate complications. Conscious Sedation: 1 mg Versed  IV, 50 mcg fentanyl  IV. Sedation Technique: Under physician supervision, Versed  and fentanyl  were administered IV for moderate sedation. Pulse oximetry, heart rate, and blood pressure were continuously monitored with an independent trained observer present. Physician face-to-face sedation time: 10 minutes. Discussion: Time out performed including verification of patient, date of birth, procedure, and target as appropriate. Informed written consent was obtained. The site was prepared and draped using maximal sterile barrier technique including cutaneous antisepsis. The patient was positioned prone. Initial imaging was performed. Biopsy target: Organ or target location: Left iliac wing. Laterality: Left. Local anesthesia was administered. Under image guidance, the biopsy needle was advanced to the target and biopsy was performed. Biopsy details: Guidance modality: Fluoroscopy. Biopsy type: Core / Aspiration. Coaxial needle: Not specified. Core needle size: Not specified. Number of core specimens: Not specified. Aspiration performed: Not specified. On-site assessment of biopsy adequacy: Not specified. Preliminary assessment of sample adequacy: Not specified. The biopsy needle was removed and a sterile dressing was applied. Tract closure: None. Post-procedure imaging: Immediate post-biopsy imaging: Not performed. Estimated blood loss: Less than 10 mL. Specimens sent for evaluation. IMPRESSION: 1. Technically successful fluoroscopy-guided bone marrow biopsy of the left iliac wing. 2. No immediate complications. Electronically signed by: Derek Hassell MD 05/23/2024 11:27 AM EST RP Workstation: [id number]     Medications:     atorvastatin   10 mg Oral Daily   carbamazepine   400 mg Oral BID   furosemide   80 mg Intravenous BID   gabapentin   600 mg Oral BID   insulin  aspart  0-15 Units  Subcutaneous TID WC   insulin  aspart  0-5 Units Subcutaneous QHS   QUEtiapine   800 mg Oral QHS   sodium chloride  flush  3 mL Intravenous Q12H   spironolactone   25 mg Oral Daily   acetaminophen  **OR** acetaminophen , ondansetron  **OR** ondansetron  (ZOFRAN ) IV, oxyCODONE , senna-docusate  Assessment/ Plan:  [name] is a 63 y.o.  male with hypertension, hyperlipidemia, diabetes mellitus type II, and BPH, who was admitted to [facility] on 05/20/2024 for Anasarca [R60.1] Scrotal swelling [N50.89] Anemia, unspecified type [D64.9] Leukopenia, unspecified type [D72.819]   Anasarca with nephrotic range proteinuria  Continuing to diurese with lasix  and spironolactone . Albumin  ordered.  Hypertension  BP 128/90 today   Diabetes mellitus type II with renal manifestations  On sliding scale insulin   Hypokalemia  Potassium 3.6 today. Replace as needed  Differential includes acute glomerulopathy, myeloplastic disease, progressive diabetic nephropathy, hepatic cirrhosis or congestive heart failure.  - serologic work up pending.  Serum complements normal. ANA negative.  - patient may need renal biopsy next week. - appreciate hematology input. Bone marrow aspiration yesterday.    LOS: 2 Derek Garza 12/27/202510:22 AM   "

## 2024-05-24 NOTE — Progress Notes (Addendum)
 " Progress Note   Patient: Derek Garza FMW:[id number] DOB: 17-Mar-1961 DOA: 05/20/2024     2 DOS: the patient was seen and examined on 05/24/2024    Brief hospital course: Derek Garza is a 63 y.o. year old male with medical history of HTN, HLD, DMII, BPH presenting to the ED with worsening scrotal swelling and fatigue.    On arrival to the ED patient was noted to be HDS stable. Lab work and imaging obtained. CBC with leukopenia, and anemia that was not previously present. MCV elevated at 117.  CMP shows hypokalemia at 3.1, decreased calcium  level, decreased protein levels and albumins. ALP elevated at 168. UA with glucosuria, and proteinuria. TSH wnl. Scrotal ultrasound shows large hydrocele with left testicle being atrophic with calcifications with some concern for malignancy. Given pts presentation, [facility] contacted for admission.   Assessment and Plan:     Scrotal swelling rule out malignancy Anasarca with nephrotic range proteinuria Patient presents with generalized weakness as well as worsening swelling in the setting of albuminemia secondary to proteinuria.   Echo in 2023 showed normal diastolic and systolic function. Monitor input and output Monitor daily weight Urologist on board Nephrology following Nephrologist recommending high-dose IV Lasix  Continue fluid restriction Follow-up on testicular tumor markers beta-hCG, LDH and AFP   Leukopenia/anemia:  Leukopenia resolved Patient underwent bone marrow biopsy on 05/23/2024 Monitor CBC Patient is status post 1 unit of blood transfusion WBC have improved     Hypertension:  Currently relatively hypotensive We will continue to monitor off antihypertensives unless indicated   Type 2 diabetes: With neuropathy Continue insulin  and gabapentin  Monitor glucose level closely   Bipolar disorder:  Oncologist suspects patient's leukopenia is due to patient's psych medications (Tegretal and seroquel ) in the setting of his  proteinuria and so recommended to hold medication however patient wants to continue. I consulted Dr. Millie Manners psychiatry to help us  with medication adjustment   Hyperlipidemia:  Continue home statin.   BPH:  Continue home tamsulosin .     VTE prophylaxis:  SCDs    Code Status:  Full Code   Family Communication: Updated at bedside   Consults called: Oncology, urology   Subjective:  Seen and examined at bedside this morning Denies nausea vomiting abdominal pain chest pain WBC improved   Physical Exam: Gen: Middle-age male laying in bed in no acute distress CV: normal heart sounds, mild lower extremity edema Lung: CTAB Abd: No TTP, normal bowel sounds, slight abdominal distention GU: Bilateral scrotal swelling right greater than left-improving MSK: No asymmetry, good bulk and tone Neuro: alert and oriented     Data Reviewed:  Vitals:   05/23/24 2113 05/24/24 0500 05/24/24 1016 05/24/24 1421  BP: 137/80 107/63 (!) 128/90 116/72  Pulse: 80 (!) 104 78 87  Resp: 18 19 16 16   Temp: 97.8 F (36.6 C) 97.8 F (36.6 C) 98.1 F (36.7 C) 98.2 F (36.8 C)  TempSrc:  Oral Oral   SpO2: 98% 99% 96% 98%  Weight:  98.1 kg    Height:          Latest Ref Rng & Units 05/24/2024    5:13 AM 05/23/2024   11:17 AM 05/23/2024    5:46 AM  CBC  WBC 4.0 - 10.5 K/uL 5.1  7.4  7.3   Hemoglobin 13.0 - 17.0 g/dL 9.0  9.7  8.8   Hematocrit 39.0 - 52.0 % 28.7  30.2  27.7   Platelets 150 - 400 K/uL 325  374  330  Latest Ref Rng & Units 05/24/2024    5:13 AM 05/22/2024    4:57 AM 05/21/2024    5:34 AM  BMP  Glucose 70 - 99 mg/dL 822  77  85   BUN 8 - 23 mg/dL 13  11  11    Creatinine 0.61 - 1.24 mg/dL 9.21  9.30  9.38   Sodium 135 - 145 mmol/L 133  134  135   Potassium 3.5 - 5.1 mmol/L 3.6  4.1  3.2   Chloride 98 - 111 mmol/L 98  99  102   CO2 22 - 32 mmol/L 28  25  28    Calcium  8.9 - 10.3 mg/dL 7.7  7.7  7.2      Author: Drue ONEIDA Potter, MD 05/24/2024 5:05 PM  For  on call review [email or web address].  "

## 2024-05-24 NOTE — Plan of Care (Signed)
" °  Problem: Education: Goal: Knowledge of General Education information will improve Description: Including pain rating scale, medication(s)/side effects and non-pharmacologic comfort measures Outcome: Progressing   Problem: Clinical Measurements: Goal: Will remain free from infection Outcome: Progressing Goal: Respiratory complications will improve Outcome: Progressing Goal: Cardiovascular complication will be avoided Outcome: Progressing   Problem: Activity: Goal: Risk for activity intolerance will decrease Outcome: Progressing   Problem: Nutrition: Goal: Adequate nutrition will be maintained Outcome: Progressing   Problem: Coping: Goal: Level of anxiety will decrease Outcome: Progressing   Problem: Elimination: Goal: Will not experience complications related to urinary retention Outcome: Progressing   Problem: Pain Managment: Goal: General experience of comfort will improve and/or be controlled Outcome: Progressing   Problem: Safety: Goal: Ability to remain free from injury will improve Outcome: Progressing   Problem: Skin Integrity: Goal: Risk for impaired skin integrity will decrease Outcome: Progressing   Problem: Fluid Volume: Goal: Ability to maintain a balanced intake and output will improve Outcome: Progressing   Problem: Metabolic: Goal: Ability to maintain appropriate glucose levels will improve Outcome: Progressing   Problem: Nutritional: Goal: Maintenance of adequate nutrition will improve Outcome: Progressing Goal: Progress toward achieving an optimal weight will improve Outcome: Progressing   Problem: Skin Integrity: Goal: Risk for impaired skin integrity will decrease Outcome: Progressing   Problem: Tissue Perfusion: Goal: Adequacy of tissue perfusion will improve Outcome: Progressing   "

## 2024-05-24 NOTE — Progress Notes (Signed)
"  ° °      Overnight   NAME: Derek Garza MRN: [id number] DOB : 1960-10-19    Date of Service   05/24/2024   HPI/Events of Note   HPI:     Overnight:       Interventions/ Plan   X X X    Updates     Laneta Gardener- Aram BSN RN CCRN AGACNP-BC Acute Care Nurse Practitioner [facility] [place]  "

## 2024-05-25 DIAGNOSIS — F3181 Bipolar II disorder: Secondary | ICD-10-CM | POA: Diagnosis not present

## 2024-05-25 DIAGNOSIS — R601 Generalized edema: Secondary | ICD-10-CM | POA: Diagnosis not present

## 2024-05-25 LAB — GLUCOSE, CAPILLARY
Glucose-Capillary: 100 mg/dL — ABNORMAL HIGH (ref 70–99)
Glucose-Capillary: 96 mg/dL (ref 70–99)
Glucose-Capillary: 131 mg/dL — ABNORMAL HIGH (ref 70–99)
Glucose-Capillary: 124 mg/dL — ABNORMAL HIGH (ref 70–99)

## 2024-05-25 MED ORDER — ALBUMIN HUMAN 25 % IV SOLN
25.0000 g | Freq: Two times a day (BID) | INTRAVENOUS | Status: AC
  Administered 2024-05-25 – 2024-05-26 (×4): 25 g via INTRAVENOUS
  Filled 2024-05-25 (×2): qty 100

## 2024-05-25 MED ORDER — QUETIAPINE FUMARATE 300 MG PO TABS: 800.0000 mg | ORAL_TABLET | Freq: Every day | ORAL | Status: DC

## 2024-05-25 MED ORDER — QUETIAPINE FUMARATE 300 MG PO TABS
800.0000 mg | ORAL_TABLET | Freq: Every day | ORAL | Status: DC
  Administered 2024-05-25 – 2024-06-01 (×8): 800 mg via ORAL
  Filled 2024-05-25 (×2): qty 1

## 2024-05-25 MED ORDER — CARBAMAZEPINE ER 100 MG PO TB12
400.0000 mg | ORAL_TABLET | Freq: Two times a day (BID) | ORAL | Status: DC
  Administered 2024-05-25 – 2024-06-02 (×17): 400 mg via ORAL
  Filled 2024-05-25 (×2): qty 4
  Filled 2024-05-25: qty 2
  Filled 2024-05-25: qty 4
  Filled 2024-05-25: qty 2

## 2024-05-25 NOTE — Progress Notes (Signed)
 " PROGRESS NOTE    Derek Garza  FMW:[id number] DOB: August 26, 1960 DOA: 05/20/2024 PCP: Valerio Melanie [name], NP    Assessment & Plan:   Principal Problem:   Anasarca Active Problems:   DM type 2 with diabetic peripheral neuropathy (HCC)   Hypertension associated with diabetes (HCC)   Hypogonadism in male   Bipolar 2 disorder (HCC)   Hyperlipidemia associated with type 2 diabetes mellitus (HCC)   Benign prostatic hyperplasia (BPH) with straining on urination  Assessment and Plan: Scrotal swelling: etiology unclear. Tumor markers are all neg but light chains are abnormal. Multiple myeloma panel is pending.  Cr is WNL currently.   Anasarca: with nephrotic range proteinuria. Etiology unclear. Bone marrow biopsy results are pending. Multiple myeloma panel is pending   Leukopenia: resolved. Etiology unclear. Bone marrow biopsy results are pending   Macrocytic anemia: folate is WNL and B12 >2000. No need for a transfusion currently. S/p 1 unit of pRBCs transfused so far    HTN: will restart home anti-HTN meds if BP continues to rise   DM2: well controlled, HbA1c 4.5.   Peripheral neuropathy: continue on home dose of gabapentin     Bipolar disorder: will restart home dose of tegretal & seroquel . Pt has been on these meds for 8 years and the only thing that works as per pt    HLD: continue on statin    BPH: no longer take tamsulosin  as per med rec     DVT prophylaxis: SCDs Code Status: full  Family Communication:  Disposition Plan: likely d/c back home .  Level of care: Telemetry Consultants:  Psych Onco  Nephro   Procedures:   Antimicrobials:   Subjective: Pt c/o wanting to restart home psych meds back   Objective: Vitals:   05/24/24 2035 05/25/24 0443 05/25/24 0500 05/25/24 0840  BP: (!) 115/93 (!) 102/56  (!) 145/76  Pulse: 80 90  91  Resp: 18 20  17   Temp: 98.3 F (36.8 C) 97.8 F (36.6 C)  98.1 F (36.7 C)  TempSrc:      SpO2: 100% 100%  96%  Weight:    96 kg   Height:       No intake or output data in the 24 hours ending 05/25/24 1027 Filed Weights   05/22/24 0500 05/24/24 0500 05/25/24 0500  Weight: 99.5 kg 98.1 kg 96 kg    Examination:  General exam: Appears calm and comfortable  Respiratory system: Clear to auscultation. Respiratory effort normal. Cardiovascular system: S1 & S2+. No  rubs, gallops or clicks. Gastrointestinal system: Abdomen is nondistended, soft and nontender. Normal bowel sounds heard. Central nervous system: Alert and oriented. Moves all extremities Psychiatry: Judgement and insight appear normal. Mood & affect appropriate.     Data Reviewed: I have personally reviewed following labs and imaging studies  CBC: Recent Labs  Lab 05/20/24 0913 05/21/24 0534 05/22/24 0457 05/23/24 0546 05/23/24 1117 05/24/24 0513  WBC 2.0*  2.0* 1.4* 7.7 7.3 7.4 5.1  NEUTROABS 0.8*  --  5.2 4.8 4.4 2.5  HGB 7.9*  8.0* 6.6* 9.5* 8.8* 9.7* 9.0*  HCT 25.8*  25.8* 20.5* 30.0* 27.7* 30.2* 28.7*  MCV 119.4*  117.8* 113.9* 114.9* 112.1* 110.6* 113.9*  PLT 367  352 265 312 330 374 325   Basic Metabolic Panel: Recent Labs  Lab 05/19/24 1150 05/20/24 0913 05/20/24 1816 05/21/24 0534 05/22/24 0457 05/24/24 0513  NA 134 135  --  135 134* 133*  K 3.5 3.1*  --  3.2* 4.1  3.6  CL 99 100  --  102 99 98  CO2 22 27  --  28 25 28   GLUCOSE 72 127*  --  85 77 177*  BUN 10 11  --  11 11 13   CREATININE 0.71* 0.81  --  0.61 0.69 0.78  CALCIUM  7.4* 7.3*  --  7.2* 7.7* 7.7*  MG  --   --  1.9  --   --   --    GFR: Estimated Creatinine Clearance: 112.2 mL/min (by C-G formula based on SCr of 0.78 mg/dL). Liver Function Tests: Recent Labs  Lab 05/19/24 1150 05/20/24 0913 05/24/24 0859  AST 15 19  --   ALT 11 11  --   ALKPHOS 162* 168*  --   BILITOT 0.2 0.2  --   PROT 4.4* 5.0*  --   ALBUMIN  2.4* 2.4* 2.3*   No results for input(s): LIPASE, AMYLASE in the last 168 hours. No results for input(s): AMMONIA in the  last 168 hours. Coagulation Profile: Recent Labs  Lab 05/21/24 1047  INR 1.0   Cardiac Enzymes: No results for input(s): CKTOTAL, CKMB, CKMBINDEX, TROPONINI in the last 168 hours. BNP (last 3 results) Recent Labs    05/20/24 1816  PROBNP 515.0*   HbA1C: No results for input(s): HGBA1C in the last 72 hours. CBG: Recent Labs  Lab 05/24/24 1322 05/24/24 1419 05/24/24 1753 05/24/24 2131 05/25/24 0841  GLUCAP 81 145* 77 124* 100*   Lipid Profile: No results for input(s): CHOL, HDL, LDLCALC, TRIG, CHOLHDL, LDLDIRECT in the last 72 hours. Thyroid Function Tests: No results for input(s): TSH, T4TOTAL, FREET4, T3FREE, THYROIDAB in the last 72 hours. Anemia Panel: No results for input(s): VITAMINB12, FOLATE, FERRITIN, TIBC, IRON, RETICCTPCT in the last 72 hours. Sepsis Labs: No results for input(s): PROCALCITON, LATICACIDVEN in the last 168 hours.  Recent Results (from the past 240 hours)  Microscopic Examination     Status: None   Collection Time: 05/19/24 11:35 AM   Urine  Result Value Ref Range Status   WBC, UA None seen 0 - 5 /hpf Final   RBC, Urine 0-2 0 - 2 /hpf Final   Epithelial Cells (non renal) None seen 0 - 10 /hpf Final   Bacteria, UA None seen None seen/Few Final  Urine Culture     Status: None   Collection Time: 05/19/24  1:19 PM   Specimen: Urine   UR  Result Value Ref Range Status   Urine Culture, Routine Final report  Final   Organism ID, Bacteria Comment  Final    Comment: Mixed urogenital flora Less than 10,000 colonies/mL          Radiology Studies: No results found.      Scheduled Meds:  atorvastatin   10 mg Oral Daily   furosemide   80 mg Intravenous BID   gabapentin   600 mg Oral BID   insulin  aspart  0-15 Units Subcutaneous TID WC   insulin  aspart  0-5 Units Subcutaneous QHS   sodium chloride  flush  3 mL Intravenous Q12H   spironolactone   25 mg Oral Daily   Continuous Infusions:    LOS: 3 days       Anthony CHRISTELLA Pouch, MD [facility] Pager [phone]  If 7PM-7AM, please contact night-coverage [email or web address] 05/25/2024, 10:27 AM   "

## 2024-05-25 NOTE — Progress Notes (Addendum)
 " [facility]  ROUNDING NOTE   Subjective:   Patient reports improved urine input. Tolerating diuretics. On room air. Denies pain. No complaints to offer at this time.  Objective:  Vital signs in last 24 hours:  Temp:  [97.8 F (36.6 C)-98.3 F (36.8 C)] 98.1 F (36.7 C) (12/28 0840) Pulse Rate:  [80-91] 91 (12/28 0840) Resp:  [16-20] 17 (12/28 0840) BP: (102-145)/(56-93) 145/76 (12/28 0840) SpO2:  [96 %-100 %] 96 % (12/28 0840) Weight:  [96 kg] 96 kg (12/28 0500)  Weight change: -2.1 kg Filed Weights   05/22/24 0500 05/24/24 0500 05/25/24 0500  Weight: 99.5 kg 98.1 kg 96 kg    Intake/Output: I/O last 3 completed shifts: In: 840 [Derek Garza.O.:840] Out: -    Intake/Output this shift:  Total I/O In: 240 [Derek Garza.O.:240] Out: -   Physical Exam: General: NAD  Head: Normocephalic  Eyes: Anicteric  Neck: Supple  Lungs:  Clear, on room air  Heart: Regular rate  Abdomen:  Soft, nontender,   Extremities:  ++ peripheral edema.  Neurologic: Alert, conversant  Skin: No lesions  GU scrotal edema  Access: none    Basic Metabolic Panel: Recent Labs  Lab 05/19/24 1150 05/20/24 0913 05/20/24 1816 05/21/24 0534 05/22/24 0457 05/24/24 0513  NA 134 135  --  135 134* 133*  K 3.5 3.1*  --  3.2* 4.1 3.6  CL 99 100  --  102 99 98  CO2 22 27  --  28 25 28   GLUCOSE 72 127*  --  85 77 177*  BUN 10 11  --  11 11 13   CREATININE 0.71* 0.81  --  0.61 0.69 0.78  CALCIUM  7.4* 7.3*  --  7.2* 7.7* 7.7*  MG  --   --  1.9  --   --   --     Liver Function Tests: Recent Labs  Lab 05/19/24 1150 05/20/24 0913 05/24/24 0859  AST 15 19  --   ALT 11 11  --   ALKPHOS 162* 168*  --   BILITOT 0.2 0.2  --   PROT 4.4* 5.0*  --   ALBUMIN  2.4* 2.4* 2.3*   No results for input(s): LIPASE, AMYLASE in the last 168 hours. No results for input(s): AMMONIA in the last 168 hours.  CBC: Recent Labs  Lab 05/20/24 0913 05/21/24 0534 05/22/24 0457 05/23/24 0546 05/23/24 1117  05/24/24 0513  WBC 2.0*  2.0* 1.4* 7.7 7.3 7.4 5.1  NEUTROABS 0.8*  --  5.2 4.8 4.4 2.5  HGB 7.9*  8.0* 6.6* 9.5* 8.8* 9.7* 9.0*  HCT 25.8*  25.8* 20.5* 30.0* 27.7* 30.2* 28.7*  MCV 119.4*  117.8* 113.9* 114.9* 112.1* 110.6* 113.9*  PLT 367  352 265 312 330 374 325    Cardiac Enzymes: No results for input(s): CKTOTAL, CKMB, CKMBINDEX, TROPONINI in the last 168 hours.  BNP: Invalid input(s): POCBNP  CBG: Recent Labs  Lab 05/24/24 1322 05/24/24 1419 05/24/24 1753 05/24/24 2131 05/25/24 0841  GLUCAP 81 145* 77 124* 100*    Microbiology: Results for orders placed or performed in visit on 05/19/24  Microscopic Examination     Status: None   Collection Time: 05/19/24 11:35 AM   Urine  Result Value Ref Range Status   WBC, UA None seen 0 - 5 /hpf Final   RBC, Urine 0-2 0 - 2 /hpf Final   Epithelial Cells (non renal) None seen 0 - 10 /hpf Final   Bacteria, UA None seen None seen/Few  Final  Urine Culture     Status: None   Collection Time: 05/19/24  1:19 PM   Specimen: Urine   UR  Result Value Ref Range Status   Urine Culture, Routine Final report  Final   Organism ID, Bacteria Comment  Final    Comment: Mixed urogenital flora Less than 10,000 colonies/mL     Coagulation Studies: No results for input(s): LABPROT, INR in the last 72 hours.   Urinalysis: No results for input(s): COLORURINE, LABSPEC, PHURINE, GLUCOSEU, HGBUR, BILIRUBINUR, KETONESUR, PROTEINUR, UROBILINOGEN, NITRITE, LEUKOCYTESUR in the last 72 hours.  Invalid input(s): APPERANCEUR     Imaging: No results found.    Medications:     atorvastatin   10 mg Oral Daily   furosemide   80 mg Intravenous BID   gabapentin   600 mg Oral BID   insulin  aspart  0-15 Units Subcutaneous TID WC   insulin  aspart  0-5 Units Subcutaneous QHS   sodium chloride  flush  3 mL Intravenous Q12H   spironolactone   25 mg Oral Daily   acetaminophen  **OR** acetaminophen ,  ondansetron  **OR** ondansetron  (ZOFRAN ) IV, oxyCODONE , senna-docusate  Assessment/ Plan:  [name] is a 63 y.o.  male with hypertension, hyperlipidemia, diabetes mellitus type II, and BPH, who was admitted to [facility] on 05/20/2024 for Anasarca [R60.1] Scrotal swelling [N50.89] Anemia, unspecified type [D64.9] Leukopenia, unspecified type [D72.819]   Anasarca with nephrotic range proteinuria  Continuing with diuretics. On Lasix  and spironolactone .   Hypertension  Currently of all antihypertensives  BP 145/76, stable   Diabetes mellitus type II with renal manifestations  On sliding scale insulin   Hypoalbuminemia  Albumin  remains low. 2.3 Will give Albumin  twice daily  5. Anemia, Macrocytic Will monitor need for ESA  Latest Reference Range & Units 05/24/24 05:13  Hemoglobin 13.0 - 17.0 g/dL 9.0 (L)  HCT 60.9 - 47.9 % 28.7 (L)  MCV 80.0 - 100.0 fL 113.9 (H)  MCH 26.0 - 34.0 pg 35.7 (H)  MCHC 30.0 - 36.0 g/dL 68.5  RDW 88.4 - 84.4 % 18.2 (H)    Differential includes acute glomerulopathy, myeloplastic disease, progressive diabetic nephropathy, hepatic cirrhosis or congestive heart failure.  - serologic work up pending. Serum complements normal. ANA negative.  - patient may need renal biopsy next week. - appreciate hematology input. Bone marrow aspiration yesterday.    LOS: 3 Derek Garza Derek Garza Derek Garza 12/28/202510:59 AM   "

## 2024-05-25 NOTE — Consult Note (Signed)
 [facility] Psychiatric Consult Initial  Patient Name: .Derek Garza  MRN: [id number]  DOB: 01-09-1961  Consult Order details:  Orders (From admission, onward)     Start     Ordered   05/24/24 2254  IP CONSULT TO PSYCHIATRY       Ordering Provider: Dorinda Drue [name], MD  Provider:  Ruther Millie SAUNDERS, MD  Question Answer Comment  Location [facility]   Reason for Consult? Psych medication adjustment      05/24/24 2253             Mode of Visit: In person    Psychiatry Consult Evaluation  Service Date: May 25, 2024 LOS:  LOS: 3 days  Chief Complaint I don't want my medications changed  Primary Psychiatric Diagnoses  Bipolar disorder   Assessment   This case was discussed extensively with supervising psychiatrist Dr. GORMAN, who is in agreement with the assessment below. Patient currently demonstrates decision-making capacity and has the legal right to choose his medications. Patient is able to adequately articulate the risks and benefits of continuing his current psychotropic regimen, including understanding the medical team's concerns regarding low white blood cell count and increased risk of infection. Patient is also able to articulate the psychiatric risks of discontinuing his medications, based on his personal history of decompensation when these medications were stopped in the past.  Under [place]  law and medical ethics principles of patient autonomy, competent patients have the right to make informed decisions about their medical care, even when those decisions differ from medical recommendations. Patient has been stable on his current regimen for 8 years, has established outpatient psychiatric care through [facility] with regular follow-up every 3 months, and is adherent to his treatment plan. While the medical team has expressed valid concerns regarding potential hematologic effects of his current medications, patient has weighed these risks  against the known psychiatric benefits and his history of severe decompensation without these medications, and has made an informed choice to continue his current regimen.  At this time, patient does not meet criteria for involuntary commitment per [place]  guidelines, as patient does not present as an imminent risk to self or others. Patient is psychiatrically stable, denies suicidal or homicidal ideation, demonstrates no evidence of psychosis or acute mania, and has capacity to make informed medical decisions. Patient's decision to continue his psychotropic medications, while contrary to medical recommendations, is within his legal rights as a patient with decision-making capacity.  Patient's concerns regarding further medical workup, including PET scan and nutritional assessment, were relayed to the primary medical team. Medical team was updated on current psychiatric assessment findings. Psychiatry is in agreement with the primary medical team's assessment that patient has the legal right to choose and continue his psychotropic medications, even though he is being encouraged by medical specialists to discontinue them. This is consistent with the principle of informed consent and patient autonomy in medical decision-making.  Diagnoses:  Active Hospital problems: Principal Problem:   Anasarca Active Problems:   DM type 2 with diabetic peripheral neuropathy (HCC)   Hypertension associated with diabetes (HCC)   Hypogonadism in male   Bipolar 2 disorder (HCC)   Hyperlipidemia associated with type 2 diabetes mellitus (HCC)   Benign prostatic hyperplasia (BPH) with straining on urination    Plan   ## Psychiatric Medication Recommendations:  See comments above in the assessment portion    ## Further Work-up:   -- most recent EKG on 05/20/2024 had QtC of 404 --  Pertinent labwork reviewed earlier this admission includes: CBC, BMP, CMP, free carbamazepine  and metabolite level still  pending   ## Disposition:-- There are no psychiatric contraindications to discharge at this time  ## Behavioral / Environmental: - No specific recommendations at this time.     ## Safety and Observation Level:  - Based on my clinical evaluation, I estimate the patient to be at low risk of self harm in the current setting. - At this time, we recommend  routine. This decision is based on my review of the chart including patient's history and current presentation, interview of the patient, mental status examination, and consideration of suicide risk including evaluating suicidal ideation, plan, intent, suicidal or self-harm behaviors, risk factors, and protective factors. This judgment is based on our ability to directly address suicide risk, implement suicide prevention strategies, and develop a safety plan while the patient is in the clinical setting. Please contact our team if there is a concern that risk level has changed.  CSSR Risk Category:C-SSRS RISK CATEGORY: No Risk  Suicide Risk Assessment: Patient has following modifiable risk factors for suicide: triggering events, which we are addressing by utilizing therapeutic communication to give patient safe space to discuss concerns. Patient has following non-modifiable or demographic risk factors for suicide: male gender and psychiatric hospitalization Patient has the following protective factors against suicide: Access to outpatient mental health care, Frustration tolerance, no history of suicide attempts, and no history of NSSIB  Thank you for this consult request. Recommendations have been communicated to the primary team.  We will sign off at this time.   Zelda Sharps, NP        History of Present Illness  Relevant Aspects of [facility]   Patient Report:  Psychiatry was consulted by the medical team due to concerns that patient's current psychotropic medication regimen of Tegretol  and Seroquel  may be contributing to his low  white blood cell count. On examination with psychiatry, patient expressed frustration that the medical team wanted to discontinue his current regimen but remained calm, cooperative, and appropriate with the psychiatry team. Patient was alert and oriented to person, place, time, and situation. On mental status examination, there was no evidence of psychosis or mania, and patient did not appear to be responding to internal stimuli. Thought process was coherent, goal-oriented, and logical.  The medical team's recommendations were discussed thoroughly with patient, and patient was able to appropriately relay these concerns and adequately discuss the risks and benefits of continued therapy with his medication regimen. Patient was able to accurately voice the concerns of the medical team, including that his decreased white blood cell count raises his risk for infection and potential other complications, including life-threatening infections and impaired ability of his body to fight infections. Patient demonstrates decision-making capacity at this time and is able to engage in informed medical decision-making regarding his treatment.  Potential alternative medication options were discussed, including Abilify. Patient reported he has tried a whole slew of medications and it took him a long time to arrive at his current medication regimen, on which he has been perfectly stable for 8 years. He stated he has been on Tegretol  and Seroquel  therapy for 8 years with no previous incidents like this occurring. Patient reported he is currently followed through [facility], where he is seen regularly every 3 months via telehealth services. Patient was able to accurately state his previous diagnoses, including bipolar disorder, and report his current medication regimen of Tegretol  and Seroquel .  Patient voiced grave concerns  about stopping these medications and adamantly stated he does not wish to discontinue them. Patient  reported he has received these medications since being medically hospitalized and that his white blood cell count has increased during this admission, leading him to believe his current psychotropic regimen is not contributing to the abnormality. Patient requested further evaluation of medical concerns, including a PET scan to evaluate for cancer, as he reported cancer runs in his family. Patient also believes his nutritional status may be a contributing factor, as he reports being financially unable to afford healthy food and often eats ramen noodles daily. Patient denied any use of alcohol, recreational drugs, or tobacco.  Patient was adamant that he does not want to change any of his medications and wishes to continue with his current regimen, voicing concern that if he is not on this medication regimen, he will decompensate psychiatrically. He reported that in the past when he has stopped these medications, he has decompensated, stating have you ever seen a crazy bipolar person screaming, well that is what happens to me.  Patient denied suicidal or homicidal ideation. He denied auditory or visual hallucinations.  Psych ROS:  Depression: Denied Anxiety: Endorsed about medication changes Mania (lifetime and current): Reported most recent hospitalization for mania symptoms occurred in 1999 Psychosis: (lifetime and current): Reported most recent hospitalization for mania symptoms occurred in 1999      Psychiatric and Social History  Psychiatric History:  Information collected from patient/chart review  Prev Dx/Sx: Bipolar disorder Current Psych Provider: [facility] Meds (current): Tegretol  and Seroquel  Previous Med Trials: Patient reported trial of multiple different medications in the past Therapy: [facility]  Prior Psych Hospitalization: Yes-most recent in 1999 Prior Self Harm: Denied Prior Violence: Denied  Family Psych History: Unsure Family Hx suicide: Denied  Social  History:   Legal Hx: Denied Living Situation: Alone Access to weapons/lethal means: Denied.  Reported he is not legally able to own a firearm  Substance History Alcohol: Denied History of alcohol withdrawal seizures denied History of DT's denied Tobacco: Denied Illicit drugs: Denied Prescription drug abuse: Denied Rehab hx: Denied  Exam Findings  Physical Exam: Reviewed and agree with the physical exam findings conducted by the medical provider Vital Signs:  Temp:  [97.8 F (36.6 C)-98.3 F (36.8 C)] 98.1 F (36.7 C) (12/28 0840) Pulse Rate:  [80-91] 91 (12/28 0840) Resp:  [16-20] 17 (12/28 0840) BP: (102-145)/(56-93) 145/76 (12/28 0840) SpO2:  [96 %-100 %] 96 % (12/28 0840) Weight:  [96 kg] 96 kg (12/28 0500) Blood pressure (!) 145/76, pulse 91, temperature 98.1 F (36.7 C), resp. rate 17, height 6' (1.829 m), weight 96 kg, SpO2 96%. Body mass index is 28.7 kg/m.    Mental Status Exam: General Appearance: Casual  Orientation:  Full (Time, Place, and Person)  Memory:  Good  Concentration:  Concentration: Good  Recall:  Good  Attention  Good  Eye Contact:  Good  Speech:  Clear and Coherent  Language:  Good  Volume:  Normal  Mood: Frustrated  Affect:  Congruent  Thought Process:  Coherent, Goal Directed, and Linear  Thought Content:  Logical  Suicidal Thoughts:  No  Homicidal Thoughts:  No  Judgement:  Fair  Insight:  Good  Psychomotor Activity:  Normal  Akathisia:  No  Fund of Knowledge:  Fair      Assets:  Musician Social Support Transportation  Cognition:  WNL  ADL's:  Intact  AIMS (if indicated):  Other History   These have been pulled in through the EMR, reviewed, and updated if appropriate.  Family History:  The patient's family history includes Breast cancer in his sister.  Medical History: Past Medical History:  Diagnosis Date   Bipolar disorder (HCC)    Cervical  myelopathy (HCC)    Diabetes mellitus without complication (HCC)    Erectile dysfunction    Gait abnormality    Hyperlipidemia    Hypertension    Neuromuscular disorder (HCC)    Paresthesia    Peripheral neuropathy    Right inguinal hernia     Surgical History: Past Surgical History:  Procedure Laterality Date   ANKLE SURGERY  05/29/1977   APPENDECTOMY  05/29/2009   CERVICAL SPINE SURGERY     HERNIA REPAIR  [id number]   INGUINAL HERNIA REPAIR Right 03/16/2023   Procedure: HERNIA REPAIR INGUINAL ADULT, open;  Surgeon: Marinda Jayson KIDD, MD;  Location: [facility] OR[facility];  Service: General;  Laterality: Right;   INSERTION OF MESH  03/16/2023   Procedure: INSERTION OF MESH;  Surgeon: Marinda Jayson KIDD, MD;  Location: [facility] OR[facility];  Service: General;;   IR BONE MARROW BIOPSY & ASPIRATION  05/23/2024   SPINE SURGERY  10/2018   VASECTOMY       Medications:  Current Medications[1]  Allergies: Allergies[2]  Zelda Sharps, NP This note was created using Dragon dictation software. Please excuse any inadvertent transcription errors. Case was discussed with supervising physician Dr. GORMAN who is agreeable with current plan.       [1]  Current Facility-Administered Medications:    acetaminophen  (TYLENOL ) tablet 650 mg, 650 mg, Oral, Q6H PRN, 650 mg at 05/21/24 0711 **OR** acetaminophen  (TYLENOL ) suppository 650 mg, 650 mg, Rectal, Q6H PRN, Fernand Prost, MD   albumin  human 25 % solution 25 g, 25 g, Intravenous, BID, Levorn, [place] P, NP   atorvastatin  (LIPITOR) tablet 10 mg, 10 mg, Oral, Daily, Khan, Ghalib, MD, 10 mg at 05/25/24 0900   furosemide  (LASIX ) injection 80 mg, 80 mg, Intravenous, BID, Kolluru, Sarath, MD, 80 mg at 05/25/24 0900   gabapentin  (NEURONTIN ) capsule 600 mg, 600 mg, Oral, BID, Hall, Carole N, DO, 600 mg at 05/25/24 0900   insulin  aspart (novoLOG ) injection 0-15 Units, 0-15 Units, Subcutaneous, TID WC, Khan, Ghalib, MD, 5 Units at 05/24/24 1151   insulin  aspart (novoLOG ) injection  0-5 Units, 0-5 Units, Subcutaneous, QHS, Fernand Prost, MD   ondansetron  (ZOFRAN ) tablet 4 mg, 4 mg, Oral, Q6H PRN, 4 mg at 05/24/24 1153 **OR** ondansetron  (ZOFRAN ) injection 4 mg, 4 mg, Intravenous, Q6H PRN, Fernand Prost, MD, 4 mg at 05/25/24 9241   oxyCODONE  (Oxy IR/ROXICODONE ) immediate release tablet 10 mg, 10 mg, Oral, Q6H PRN, Djan, Prince T, MD, 10 mg at 05/25/24 0757   senna-docusate (Senokot-S) tablet 1 tablet, 1 tablet, Oral, QHS PRN, Fernand Prost, MD, 1 tablet at 05/24/24 2130   sodium chloride  flush (NS) 0.9 % injection 3 mL, 3 mL, Intravenous, Q12H, Fernand Prost, MD, 3 mL at 05/25/24 0850   spironolactone  (ALDACTONE ) tablet 25 mg, 25 mg, Oral, Daily, Kolluru, Sarath, MD, 25 mg at 05/25/24 0900 [2]  Allergies Allergen Reactions   Percocet [Oxycodone -Acetaminophen ] Nausea Only

## 2024-05-25 NOTE — Plan of Care (Signed)
  Problem: Coping: °Goal: Level of anxiety will decrease °Outcome: Progressing °  °Problem: Elimination: °Goal: Will not experience complications related to bowel motility °Outcome: Progressing °  °Problem: Safety: °Goal: Ability to remain free from injury will improve °Outcome: Progressing °  °Problem: Skin Integrity: °Goal: Risk for impaired skin integrity will decrease °Outcome: Progressing °  °

## 2024-05-25 NOTE — Progress Notes (Signed)
 Telemetry artist, patient had 4 beat of v tach at 1450 and frequent missed beats. MD notified. Patient otherwise asymptomatic.

## 2024-05-26 ENCOUNTER — Encounter (HOSPITAL_COMMUNITY): Payer: Self-pay

## 2024-05-26 DIAGNOSIS — R601 Generalized edema: Secondary | ICD-10-CM | POA: Diagnosis not present

## 2024-05-26 LAB — BASIC METABOLIC PANEL WITH GFR
Anion gap: 8 (ref 5–15)
BUN: 12 mg/dL (ref 8–23)
CO2: 27 mmol/L (ref 22–32)
Calcium: 8.2 mg/dL — ABNORMAL LOW (ref 8.9–10.3)
Chloride: 96 mmol/L — ABNORMAL LOW (ref 98–111)
Creatinine, Ser: 0.72 mg/dL (ref 0.61–1.24)
GFR, Estimated: >60 mL/min
Glucose, Bld: 85 mg/dL (ref 70–99)
Potassium: 4 mmol/L (ref 3.5–5.1)
Sodium: 132 mmol/L — ABNORMAL LOW (ref 135–145)

## 2024-05-26 LAB — CBC
HCT: 24.7 % — ABNORMAL LOW (ref 39.0–52.0)
Hemoglobin: 8 g/dL — ABNORMAL LOW (ref 13.0–17.0)
MCH: 35.4 pg — ABNORMAL HIGH (ref 26.0–34.0)
MCHC: 32.4 g/dL (ref 30.0–36.0)
MCV: 109.3 fL — ABNORMAL HIGH (ref 80.0–100.0)
Platelets: 309 K/uL (ref 150–400)
RBC: 2.26 MIL/uL — ABNORMAL LOW (ref 4.22–5.81)
RDW: 17.3 % — ABNORMAL HIGH (ref 11.5–15.5)
WBC: 4.8 K/uL (ref 4.0–10.5)
nRBC: 0 % (ref 0.0–0.2)

## 2024-05-26 LAB — ALBUMIN: Albumin: 2.9 g/dL — ABNORMAL LOW (ref 3.5–5.0)

## 2024-05-26 LAB — GLUCOSE, CAPILLARY
Glucose-Capillary: 109 mg/dL — ABNORMAL HIGH (ref 70–99)
Glucose-Capillary: 117 mg/dL — ABNORMAL HIGH (ref 70–99)
Glucose-Capillary: 147 mg/dL — ABNORMAL HIGH (ref 70–99)
Glucose-Capillary: 118 mg/dL — ABNORMAL HIGH (ref 70–99)

## 2024-05-26 MED ORDER — POTASSIUM CHLORIDE CRYS ER 20 MEQ PO TBCR
40.0000 meq | EXTENDED_RELEASE_TABLET | Freq: Once | ORAL | Status: AC
  Administered 2024-05-26: 40 meq via ORAL
  Filled 2024-05-26: qty 2

## 2024-05-26 MED ORDER — POTASSIUM CHLORIDE 20 MEQ PO PACK
40.0000 meq | PACK | Freq: Once | ORAL | Status: AC
  Administered 2024-05-26: 40 meq via ORAL
  Filled 2024-05-26: qty 2

## 2024-05-26 NOTE — Progress Notes (Addendum)
 " [facility]  ROUNDING NOTE   Subjective:   Patient seen sitting up in chair Reports he's making frequent trips to bathroom Room air Scrotal edema remains present Complains he doesn't feel like he empty's when he goes to restroom  Objective:  Vital signs in last 24 hours:  Temp:  [98.1 F (36.7 C)-98.7 F (37.1 C)] 98.1 F (36.7 C) (12/29 0843) Pulse Rate:  [74-96] 96 (12/29 0843) Resp:  [16-20] 16 (12/29 0843) BP: (104-140)/(53-74) 129/74 (12/29 0843) SpO2:  [97 %-100 %] 97 % (12/29 0843) Weight:  [90.7 kg] 90.7 kg (12/29 0500)  Weight change: -5.3 kg Filed Weights   05/24/24 0500 05/25/24 0500 05/26/24 0500  Weight: 98.1 kg 96 kg 90.7 kg    Intake/Output: I/O last 3 completed shifts: In: 331.2 [P.O.:240; IV Piggyback:91.2] Out: -    Intake/Output this shift:  No intake/output data recorded.  Physical Exam: General: NAD  Head: Normocephalic  Eyes: Anicteric  Lungs:  Clear, on room air  Heart: Regular rate  Abdomen:  Soft, nontender,   Extremities:  ++ peripheral edema  Neurologic: Alert, conversant  Skin: No lesions  GU scrotal edema  Access: none    Basic Metabolic Panel: Recent Labs  Lab 05/20/24 0913 05/20/24 1816 05/21/24 0534 05/22/24 0457 05/24/24 0513 05/26/24 0619  NA 135  --  135 134* 133* 132*  K 3.1*  --  3.2* 4.1 3.6 4.0  CL 100  --  102 99 98 96*  CO2 27  --  28 25 28 27   GLUCOSE 127*  --  85 77 177* 85  BUN 11  --  11 11 13 12   CREATININE 0.81  --  0.61 0.69 0.78 0.72  CALCIUM  7.3*  --  7.2* 7.7* 7.7* 8.2*  MG  --  1.9  --   --   --   --     Liver Function Tests: Recent Labs  Lab 05/20/24 0913 05/24/24 0859 05/26/24 0619  AST 19  --   --   ALT 11  --   --   ALKPHOS 168*  --   --   BILITOT 0.2  --   --   PROT 5.0*  --   --   ALBUMIN  2.4* 2.3* 2.9*   No results for input(s): LIPASE, AMYLASE in the last 168 hours. No results for input(s): AMMONIA in the last 168 hours.  CBC: Recent Labs  Lab  05/20/24 0913 05/21/24 0534 05/22/24 0457 05/23/24 0546 05/23/24 1117 05/24/24 0513 05/26/24 0619  WBC 2.0*  2.0*   < > 7.7 7.3 7.4 5.1 4.8  NEUTROABS 0.8*  --  5.2 4.8 4.4 2.5  --   HGB 7.9*  8.0*   < > 9.5* 8.8* 9.7* 9.0* 8.0*  HCT 25.8*  25.8*   < > 30.0* 27.7* 30.2* 28.7* 24.7*  MCV 119.4*  117.8*   < > 114.9* 112.1* 110.6* 113.9* 109.3*  PLT 367  352   < > 312 330 374 325 309   < > = values in this interval not displayed.    Cardiac Enzymes: No results for input(s): CKTOTAL, CKMB, CKMBINDEX, TROPONINI in the last 168 hours.  BNP: Invalid input(s): POCBNP  CBG: Recent Labs  Lab 05/25/24 1210 05/25/24 1631 05/25/24 2201 05/26/24 0846 05/26/24 1145  GLUCAP 96 131* 124* 109* 117*    Microbiology: Results for orders placed or performed in visit on 05/19/24  Microscopic Examination     Status: None   Collection Time: 05/19/24  11:35 AM   Urine  Result Value Ref Range Status   WBC, UA None seen 0 - 5 /hpf Final   RBC, Urine 0-2 0 - 2 /hpf Final   Epithelial Cells (non renal) None seen 0 - 10 /hpf Final   Bacteria, UA None seen None seen/Few Final  Urine Culture     Status: None   Collection Time: 05/19/24  1:19 PM   Specimen: Urine   UR  Result Value Ref Range Status   Urine Culture, Routine Final report  Final   Organism ID, Bacteria Comment  Final    Comment: Mixed urogenital flora Less than 10,000 colonies/mL     Coagulation Studies: No results for input(s): LABPROT, INR in the last 72 hours.   Urinalysis: No results for input(s): COLORURINE, LABSPEC, PHURINE, GLUCOSEU, HGBUR, BILIRUBINUR, KETONESUR, PROTEINUR, UROBILINOGEN, NITRITE, LEUKOCYTESUR in the last 72 hours.  Invalid input(s): APPERANCEUR     Imaging: No results found.    Medications:    albumin  human 25 g (05/26/24 0948)    atorvastatin   10 mg Oral Daily   carbamazepine   400 mg Oral BID   furosemide   80 mg Intravenous BID   gabapentin    600 mg Oral BID   insulin  aspart  0-15 Units Subcutaneous TID WC   insulin  aspart  0-5 Units Subcutaneous QHS   QUEtiapine   800 mg Oral QHS   sodium chloride  flush  3 mL Intravenous Q12H   spironolactone   25 mg Oral Daily   acetaminophen  **OR** acetaminophen , ondansetron  **OR** ondansetron  (ZOFRAN ) IV, oxyCODONE , senna-docusate  Assessment/ Plan:  [name] is a 63 y.o.  male with hypertension, hyperlipidemia, diabetes mellitus type II, and BPH, who was admitted to [facility] on 05/20/2024 for Anasarca [R60.1] Scrotal swelling [N50.89] Anemia, unspecified type [D64.9] Leukopenia, unspecified type [D72.819]   Anasarca with nephrotic range proteinuria  Continue IV Furosemide  80mg  twice daily with albumin . Requested daily standing weights. Bladder scan ordered,  Hypertension, essential  Currently of all antihypertensives  BP stable   Diabetes mellitus type II with renal manifestations  On sliding scale insulin , well controlled  Hypoalbuminemia  Albumin  2.9.  Continue Albumin  twice daily  5. Anemia, Macrocytic Will monitor need for ESA  Latest Reference Range & Units 05/24/24 05:13  Hemoglobin 13.0 - 17.0 g/dL 9.0 (L)  HCT 60.9 - 47.9 % 28.7 (L)  MCV 80.0 - 100.0 fL 113.9 (H)  MCH 26.0 - 34.0 pg 35.7 (H)  MCHC 30.0 - 36.0 g/dL 68.5  RDW 88.4 - 84.4 % 18.2 (H)    Differential includes acute glomerulopathy, myeloplastic disease, progressive diabetic nephropathy, hepatic cirrhosis or congestive heart failure.  - serologic work up pending. Serum complements normal. ANA negative.  - patient may need renal biopsy next week. - appreciate hematology input. Bone marrow aspiration yesterday.    LOS: 4 Derek Garza 12/29/20253:58 PM   "

## 2024-05-26 NOTE — Progress Notes (Signed)
 " PROGRESS NOTE    Derek Garza  FMW:[id number] DOB: September 12, 1960 DOA: 05/20/2024 PCP: Valerio Melanie [name], NP    Assessment & Plan:   Principal Problem:   Anasarca Active Problems:   DM type 2 with diabetic peripheral neuropathy (HCC)   Hypertension associated with diabetes (HCC)   Hypogonadism in male   Bipolar 2 disorder (HCC)   Hyperlipidemia associated with type 2 diabetes mellitus (HCC)   Benign prostatic hyperplasia (BPH) with straining on urination  Assessment and Plan: Scrotal swelling: etiology unclear. Tumor markers are all neg but light chains are abnormal. Multiple myeloma panel is pending.  Cr is still WNL   Anasarca: with nephrotic range proteinuria. Etiology unclear. Bone marrow biopsy results are pending. Multiple myeloma panel is pending   Leukopenia: resolved. Etiology unclear. Bone marrow biopsy results are pending   Macrocytic anemia: folate is WNL and B12 >2000. Will transfuse again if Hb < 7.0. S/p 1 unit of pRBCs transfused so far    HTN: BP is currently WNL    DM2: well controlled, HbA1c 4.5. Diet controlled   Peripheral neuropathy: continue on home dose of gabapentin     Bipolar disorder: continue on home dose of tegretal & seroquel .Pt has been on these meds for 8 years and the only thing that works as per pt    HLD: continue on statin    BPH: no longer take tamsulosin  as per med rec     DVT prophylaxis: SCDs Code Status: full  Family Communication:  Disposition Plan: likely d/c back home .  Level of care: Telemetry Consultants:  Psych Onco  Nephro   Procedures:   Antimicrobials:   Subjective: Pt c/o fatigue and wanting to know answers on what is going on with him    Objective: Vitals:   05/25/24 2000 05/26/24 0444 05/26/24 0500 05/26/24 0843  BP: (!) 120/53 (!) 104/53  129/74  Pulse: 74 92  96  Resp: 20 20  16   Temp: 98.4 F (36.9 C) 98.7 F (37.1 C)  98.1 F (36.7 C)  TempSrc:  Oral    SpO2: 100% 98%  97%  Weight:   90.7  kg   Height:        Intake/Output Summary (Last 24 hours) at 05/26/2024 0857 Last data filed at 05/25/2024 1503 Gross per 24 hour  Intake 331.23 ml  Output --  Net 331.23 ml   Filed Weights   05/24/24 0500 05/25/24 0500 05/26/24 0500  Weight: 98.1 kg 96 kg 90.7 kg    Examination:  General exam: Appears comfortable   Respiratory system: clear breath sounds b/l  Cardiovascular system: S1/S2+. No rubs or clicks  Gastrointestinal system: abd is soft, NT, hypoactive bowel sounds  Central nervous system: alert & oriented. Moves all extremities  Psychiatry: Judgement and insight appears at baseline. Appropriate mood and affect    Data Reviewed: I have personally reviewed following labs and imaging studies  CBC: Recent Labs  Lab 05/20/24 0913 05/21/24 0534 05/22/24 0457 05/23/24 0546 05/23/24 1117 05/24/24 0513 05/26/24 0619  WBC 2.0*  2.0*   < > 7.7 7.3 7.4 5.1 4.8  NEUTROABS 0.8*  --  5.2 4.8 4.4 2.5  --   HGB 7.9*  8.0*   < > 9.5* 8.8* 9.7* 9.0* 8.0*  HCT 25.8*  25.8*   < > 30.0* 27.7* 30.2* 28.7* 24.7*  MCV 119.4*  117.8*   < > 114.9* 112.1* 110.6* 113.9* 109.3*  PLT 367  352   < > 312 330 374  325 309   < > = values in this interval not displayed.   Basic Metabolic Panel: Recent Labs  Lab 05/20/24 0913 05/20/24 1816 05/21/24 0534 05/22/24 0457 05/24/24 0513 05/26/24 0619  NA 135  --  135 134* 133* 132*  K 3.1*  --  3.2* 4.1 3.6 4.0  CL 100  --  102 99 98 96*  CO2 27  --  28 25 28 27   GLUCOSE 127*  --  85 77 177* 85  BUN 11  --  11 11 13 12   CREATININE 0.81  --  0.61 0.69 0.78 0.72  CALCIUM  7.3*  --  7.2* 7.7* 7.7* 8.2*  MG  --  1.9  --   --   --   --    GFR: Estimated Creatinine Clearance: 102.4 mL/min (by C-G formula based on SCr of 0.72 mg/dL). Liver Function Tests: Recent Labs  Lab 05/19/24 1150 05/20/24 0913 05/24/24 0859  AST 15 19  --   ALT 11 11  --   ALKPHOS 162* 168*  --   BILITOT 0.2 0.2  --   PROT 4.4* 5.0*  --   ALBUMIN  2.4*  2.4* 2.3*   No results for input(s): LIPASE, AMYLASE in the last 168 hours. No results for input(s): AMMONIA in the last 168 hours. Coagulation Profile: Recent Labs  Lab 05/21/24 1047  INR 1.0   Cardiac Enzymes: No results for input(s): CKTOTAL, CKMB, CKMBINDEX, TROPONINI in the last 168 hours. BNP (last 3 results) Recent Labs    05/20/24 1816  PROBNP 515.0*   HbA1C: No results for input(s): HGBA1C in the last 72 hours. CBG: Recent Labs  Lab 05/25/24 0841 05/25/24 1210 05/25/24 1631 05/25/24 2201 05/26/24 0846  GLUCAP 100* 96 131* 124* 109*   Lipid Profile: No results for input(s): CHOL, HDL, LDLCALC, TRIG, CHOLHDL, LDLDIRECT in the last 72 hours. Thyroid Function Tests: No results for input(s): TSH, T4TOTAL, FREET4, T3FREE, THYROIDAB in the last 72 hours. Anemia Panel: No results for input(s): VITAMINB12, FOLATE, FERRITIN, TIBC, IRON, RETICCTPCT in the last 72 hours. Sepsis Labs: No results for input(s): PROCALCITON, LATICACIDVEN in the last 168 hours.  Recent Results (from the past 240 hours)  Microscopic Examination     Status: None   Collection Time: 05/19/24 11:35 AM   Urine  Result Value Ref Range Status   WBC, UA None seen 0 - 5 /hpf Final   RBC, Urine 0-2 0 - 2 /hpf Final   Epithelial Cells (non renal) None seen 0 - 10 /hpf Final   Bacteria, UA None seen None seen/Few Final  Urine Culture     Status: None   Collection Time: 05/19/24  1:19 PM   Specimen: Urine   UR  Result Value Ref Range Status   Urine Culture, Routine Final report  Final   Organism ID, Bacteria Comment  Final    Comment: Mixed urogenital flora Less than 10,000 colonies/mL          Radiology Studies: No results found.      Scheduled Meds:  atorvastatin   10 mg Oral Daily   carbamazepine   400 mg Oral BID   furosemide   80 mg Intravenous BID   gabapentin   600 mg Oral BID   insulin  aspart  0-15 Units Subcutaneous TID  WC   insulin  aspart  0-5 Units Subcutaneous QHS   QUEtiapine   800 mg Oral QHS   sodium chloride  flush  3 mL Intravenous Q12H   spironolactone   25 mg Oral  Daily   Continuous Infusions:  albumin  human 25 g (05/25/24 2202)     LOS: 4 days       Anthony CHRISTELLA Pouch, MD [facility] Pager [phone]  If 7PM-7AM, please contact night-coverage [email or web address] 05/26/2024, 8:57 AM   "

## 2024-05-26 NOTE — Plan of Care (Signed)
   Problem: Education: Goal: Knowledge of General Education information will improve Description: Including pain rating scale, medication(s)/side effects and non-pharmacologic comfort measures Outcome: Progressing   Problem: Pain Managment: Goal: General experience of comfort will improve and/or be controlled Outcome: Progressing   Problem: Safety: Goal: Ability to remain free from injury will improve Outcome: Progressing

## 2024-05-27 ENCOUNTER — Ambulatory Visit: Admitting: Nurse Practitioner

## 2024-05-27 ENCOUNTER — Telehealth: Payer: Self-pay | Admitting: Nurse Practitioner

## 2024-05-27 DIAGNOSIS — R601 Generalized edema: Secondary | ICD-10-CM | POA: Diagnosis not present

## 2024-05-27 LAB — SURGICAL PATHOLOGY

## 2024-05-27 LAB — GLUCOSE, CAPILLARY
Glucose-Capillary: 159 mg/dL — ABNORMAL HIGH (ref 70–99)
Glucose-Capillary: 91 mg/dL (ref 70–99)
Glucose-Capillary: 112 mg/dL — ABNORMAL HIGH (ref 70–99)
Glucose-Capillary: 174 mg/dL — ABNORMAL HIGH (ref 70–99)

## 2024-05-27 LAB — MULTIPLE MYELOMA PANEL, SERUM
Albumin SerPl Elph-Mcnc: 1.7 g/dL — ABNORMAL LOW (ref 2.9–4.4)
Albumin/Glob SerPl: 0.9 (ref 0.7–1.7)
Alpha 1: 0.1 g/dL (ref 0.0–0.4)
Alpha2 Glob SerPl Elph-Mcnc: 0.6 g/dL (ref 0.4–1.0)
B-Globulin SerPl Elph-Mcnc: 0.7 g/dL (ref 0.7–1.3)
Gamma Glob SerPl Elph-Mcnc: 0.5 g/dL (ref 0.4–1.8)
Globulin, Total: 1.9 g/dL — ABNORMAL LOW (ref 2.2–3.9)
IgA: 322 mg/dL (ref 61–437)
IgG (Immunoglobin G), Serum: 615 mg/dL (ref 603–1613)
IgM (Immunoglobulin M), Srm: 40 mg/dL (ref 20–172)
Total Protein ELP: 3.6 g/dL — ABNORMAL LOW (ref 6.0–8.5)

## 2024-05-27 MED ORDER — FUROSEMIDE 10 MG/ML IJ SOLN
40.0000 mg | Freq: Three times a day (TID) | INTRAMUSCULAR | Status: DC
  Administered 2024-05-27 – 2024-06-02 (×19): 40 mg via INTRAVENOUS
  Filled 2024-05-27 (×19): qty 4

## 2024-05-27 MED ORDER — FUROSEMIDE 10 MG/ML IJ SOLN
4.0000 mg/h | INTRAVENOUS | Status: DC
  Filled 2024-05-27: qty 20

## 2024-05-27 NOTE — Consult Note (Signed)
 [facility] Follow Up  Patient Name: .Derek Garza  MRN: [id number]  DOB: 05/02/1961  Consult Order details:  Orders (From admission, onward)     Start     Ordered   05/24/24 2254  IP CONSULT TO PSYCHIATRY       Ordering Provider: Dorinda Drue [name], MD  Provider:  Ruther Millie SAUNDERS, MD  Question Answer Comment  Location [facility]   Reason for Consult? Psych medication adjustment      05/24/24 2253             Mode of Visit: In person    Psychiatry Consult Evaluation  Service Date: May 27, 2024 LOS:  LOS: 5 days  Chief Complaint I don't want my medications changed  Primary Psychiatric Diagnoses  Bipolar disorder   Assessment   This case was discussed extensively with supervising psychiatrist Dr. GORMAN, who is in agreement with the assessment below. Patient currently demonstrates decision-making capacity and has the legal right to choose his medications. Patient is able to adequately articulate the risks and benefits of continuing his current psychotropic regimen, including understanding the medical team's concerns regarding low white blood cell count and increased risk of infection. Patient is also able to articulate the psychiatric risks of discontinuing his medications, based on his personal history of decompensation when these medications were stopped in the past.  Under [place]  law and medical ethics principles of patient autonomy, competent patients have the right to make informed decisions about their medical care, even when those decisions differ from medical recommendations. Patient has been stable on his current regimen for 8 years, has established outpatient psychiatric care through [facility] with regular follow-up every 3 months, and is adherent to his treatment plan. While the medical team has expressed valid concerns regarding potential hematologic effects of his current medications, patient has weighed these risks  against the known psychiatric benefits and his history of severe decompensation without these medications, and has made an informed choice to continue his current regimen.  At this time, patient does not meet criteria for involuntary commitment per [place]  guidelines, as patient does not present as an imminent risk to self or others. Patient is psychiatrically stable, denies suicidal or homicidal ideation, demonstrates no evidence of psychosis or acute mania, and has capacity to make informed medical decisions. Patient's decision to continue his psychotropic medications, while contrary to medical recommendations, is within his legal rights as a patient with decision-making capacity.  Patient's concerns regarding further medical workup, including PET scan and nutritional assessment, were relayed to the primary medical team. Medical team was updated on current psychiatric assessment findings. Psychiatry is in agreement with the primary medical team's assessment that patient has the legal right to choose and continue his psychotropic medications, even though he is being encouraged by medical specialists to discontinue them. This is consistent with the principle of informed consent and patient autonomy in medical decision-making.  Diagnoses:  Active Hospital problems: Principal Problem:   Anasarca Active Problems:   DM type 2 with diabetic peripheral neuropathy (HCC)   Hypertension associated with diabetes (HCC)   Hypogonadism in male   Bipolar 2 disorder (HCC)   Hyperlipidemia associated with type 2 diabetes mellitus (HCC)   Benign prostatic hyperplasia (BPH) with straining on urination    Plan   ## Psychiatric Medication Recommendations:  See comments above in the assessment portion    ## Further Work-up:   -- most recent EKG on 05/20/2024 had QtC of  404 -- Pertinent labwork reviewed earlier this admission includes: CBC, BMP, CMP, free carbamazepine  and metabolite level still  pending   ## Disposition:-- There are no psychiatric contraindications to discharge at this time  ## Behavioral / Environmental: - No specific recommendations at this time.     ## Safety and Observation Level:  - Based on my clinical evaluation, I estimate the patient to be at low risk of self harm in the current setting. - At this time, we recommend  routine. This decision is based on my review of the chart including patient's history and current presentation, interview of the patient, mental status examination, and consideration of suicide risk including evaluating suicidal ideation, plan, intent, suicidal or self-harm behaviors, risk factors, and protective factors. This judgment is based on our ability to directly address suicide risk, implement suicide prevention strategies, and develop a safety plan while the patient is in the clinical setting. Please contact our team if there is a concern that risk level has changed.  CSSR Risk Category:C-SSRS RISK CATEGORY: No Risk  Suicide Risk Assessment: Patient has following modifiable risk factors for suicide: triggering events, which we are addressing by utilizing therapeutic communication to give patient safe space to discuss concerns. Patient has following non-modifiable or demographic risk factors for suicide: male gender and psychiatric hospitalization Patient has the following protective factors against suicide: Access to outpatient mental health care, Frustration tolerance, no history of suicide attempts, and no history of NSSIB  Thank you for this consult request. Recommendations have been communicated to the primary team.  We will sign off at this time.   Zelda Sharps, NP        History of Present Illness  Relevant Aspects of [facility]   Patient Report:  Psychiatry was consulted by the medical team due to concerns that patient's current psychotropic medication regimen of Tegretol  and Seroquel  may be contributing to his low  white blood cell count. On examination with psychiatry, patient expressed frustration that the medical team wanted to discontinue his current regimen but remained calm, cooperative, and appropriate with the psychiatry team. Patient was alert and oriented to person, place, time, and situation. On mental status examination, there was no evidence of psychosis or mania, and patient did not appear to be responding to internal stimuli. Thought process was coherent, goal-oriented, and logical.  The medical team's recommendations were discussed thoroughly with patient, and patient was able to appropriately relay these concerns and adequately discuss the risks and benefits of continued therapy with his medication regimen. Patient was able to accurately voice the concerns of the medical team, including that his decreased white blood cell count raises his risk for infection and potential other complications, including life-threatening infections and impaired ability of his body to fight infections. Patient demonstrates decision-making capacity at this time and is able to engage in informed medical decision-making regarding his treatment.  Potential alternative medication options were discussed, including Abilify. Patient reported he has tried a whole slew of medications and it took him a long time to arrive at his current medication regimen, on which he has been perfectly stable for 8 years. He stated he has been on Tegretol  and Seroquel  therapy for 8 years with no previous incidents like this occurring. Patient reported he is currently followed through [facility], where he is seen regularly every 3 months via telehealth services. Patient was able to accurately state his previous diagnoses, including bipolar disorder, and report his current medication regimen of Tegretol  and Seroquel .  Patient voiced  grave concerns about stopping these medications and adamantly stated he does not wish to discontinue them. Patient  reported he has received these medications since being medically hospitalized and that his white blood cell count has increased during this admission, leading him to believe his current psychotropic regimen is not contributing to the abnormality. Patient requested further evaluation of medical concerns, including a PET scan to evaluate for cancer, as he reported cancer runs in his family. Patient also believes his nutritional status may be a contributing factor, as he reports being financially unable to afford healthy food and often eats ramen noodles daily. Patient denied any use of alcohol, recreational drugs, or tobacco.  Patient was adamant that he does not want to change any of his medications and wishes to continue with his current regimen, voicing concern that if he is not on this medication regimen, he will decompensate psychiatrically. He reported that in the past when he has stopped these medications, he has decompensated, stating have you ever seen a crazy bipolar person screaming, well that is what happens to me.  Patient denied suicidal or homicidal ideation. He denied auditory or visual hallucinations.  Psych ROS:  Depression: Denied Anxiety: Endorsed about medication changes Mania (lifetime and current): Reported most recent hospitalization for mania symptoms occurred in 1999 Psychosis: (lifetime and current): Reported most recent hospitalization for mania symptoms occurred in 1999      Psychiatric and Social History  Psychiatric History:  Information collected from patient/chart review  Prev Dx/Sx: Bipolar disorder Current Psych Provider: [facility] Home Meds (current): Tegretol  and Seroquel  Previous Med Trials: Patient reported trial of multiple different medications in the past Therapy: [facility]  Prior Psych Hospitalization: Yes-most recent in 1999 Prior Self Harm: Denied Prior Violence: Denied  Family Psych History: Unsure Family Hx suicide: Denied  Social  History:   Legal Hx: Denied Living Situation: Alone Access to weapons/lethal means: Denied.  Reported he is not legally able to own a firearm  Substance History Alcohol: Denied History of alcohol withdrawal seizures denied History of DT's denied Tobacco: Denied Illicit drugs: Denied Prescription drug abuse: Denied Rehab hx: Denied  Exam Findings  Physical Exam: Reviewed and agree with the physical exam findings conducted by the medical provider Vital Signs:  Temp:  [98.3 F (36.8 C)] 98.3 F (36.8 C) (12/30 0733) Pulse Rate:  [81-97] 97 (12/30 0733) Resp:  [16-20] 16 (12/30 0733) BP: (97-133)/(46-71) 118/54 (12/30 0733) SpO2:  [98 %-99 %] 98 % (12/30 0733) Weight:  [96.9 kg] 96.9 kg (12/30 0500) Blood pressure (!) 118/54, pulse 97, temperature 98.3 F (36.8 C), temperature source Oral, resp. rate 16, height 6' (1.829 m), weight 96.9 kg, SpO2 98%. Body mass index is 28.97 kg/m.    Mental Status Exam: General Appearance: Casual  Orientation:  Full (Time, Place, and Person)  Memory:  Good  Concentration:  Concentration: Good  Recall:  Good  Attention  Good  Eye Contact:  Good  Speech:  Clear and Coherent  Language:  Good  Volume:  Normal  Mood: Frustrated  Affect:  Congruent  Thought Process:  Coherent, Goal Directed, and Linear  Thought Content:  Logical  Suicidal Thoughts:  No  Homicidal Thoughts:  No  Judgement:  Fair  Insight:  Good  Psychomotor Activity:  Normal  Akathisia:  No  Fund of Knowledge:  Fair      Assets:  Musician Social Support Transportation  Cognition:  WNL  ADL's:  Intact  AIMS (  if indicated):        Other History   These have been pulled in through the EMR, reviewed, and updated if appropriate.  Family History:  The patient's family history includes Breast cancer in his sister.  Medical History: Past Medical History:  Diagnosis Date   Bipolar disorder (HCC)     Cervical myelopathy (HCC)    Diabetes mellitus without complication (HCC)    Erectile dysfunction    Gait abnormality    Hyperlipidemia    Hypertension    Neuromuscular disorder (HCC)    Paresthesia    Peripheral neuropathy    Right inguinal hernia     Surgical History: Past Surgical History:  Procedure Laterality Date   ANKLE SURGERY  05/29/1977   APPENDECTOMY  05/29/2009   CERVICAL SPINE SURGERY     HERNIA REPAIR  [id number]   INGUINAL HERNIA REPAIR Right 03/16/2023   Procedure: HERNIA REPAIR INGUINAL ADULT, open;  Surgeon: Marinda Jayson KIDD, MD;  Location: [facility] ORS;  Service: General;  Laterality: Right;   INSERTION OF MESH  03/16/2023   Procedure: INSERTION OF MESH;  Surgeon: Marinda Jayson KIDD, MD;  Location: [facility] OR[facility];  Service: General;;   IR BONE MARROW BIOPSY & ASPIRATION  05/23/2024   SPINE SURGERY  10/2018   VASECTOMY       Medications:  Current Medications[1]  Allergies: Allergies[2]  Zelda Sharps, NP This note was created using Dragon dictation software. Please excuse any inadvertent transcription errors. Case was discussed with supervising physician Dr. GORMAN who is agreeable with current plan.       [1]  Current Facility-Administered Medications:    acetaminophen  (TYLENOL ) tablet 650 mg, 650 mg, Oral, Q6H PRN, 650 mg at 05/21/24 0711 **OR** acetaminophen  (TYLENOL ) suppository 650 mg, 650 mg, Rectal, Q6H PRN, Fernand Prost, MD   atorvastatin  (LIPITOR) tablet 10 mg, 10 mg, Oral, Daily, Khan, Ghalib, MD, 10 mg at 05/27/24 1021   carbamazepine  (TEGRETOL  XR) 12 hr tablet 400 mg, 400 mg, Oral, BID, Trudy Anthony HERO, MD, 400 mg at 05/27/24 1021   furosemide  (LASIX ) 200 mg in dextrose  5 % 100 mL (2 mg/mL) infusion, 4 mg/hr, Intravenous, Continuous, Breeze, Shantelle, NP   gabapentin  (NEURONTIN ) capsule 600 mg, 600 mg, Oral, BID, Hall, Carole N, DO, 600 mg at 05/27/24 1021   insulin  aspart (novoLOG ) injection 0-15 Units, 0-15 Units, Subcutaneous, TID WC, Khan, Ghalib,  MD, 3 Units at 05/27/24 9250   insulin  aspart (novoLOG ) injection 0-5 Units, 0-5 Units, Subcutaneous, QHS, Fernand Prost, MD   ondansetron  (ZOFRAN ) tablet 4 mg, 4 mg, Oral, Q6H PRN, 4 mg at 05/24/24 1153 **OR** ondansetron  (ZOFRAN ) injection 4 mg, 4 mg, Intravenous, Q6H PRN, Fernand Prost, MD, 4 mg at 05/27/24 1133   oxyCODONE  (Oxy IR/ROXICODONE ) immediate release tablet 10 mg, 10 mg, Oral, Q6H PRN, Djan, Prince T, MD, 10 mg at 05/27/24 1133   QUEtiapine  (SEROQUEL ) tablet 800 mg, 800 mg, Oral, QHS, Williams, Jamiese M, MD, 800 mg at 05/26/24 2153   senna-docusate (Senokot-S) tablet 1 tablet, 1 tablet, Oral, QHS PRN, Fernand Prost, MD, 1 tablet at 05/25/24 2147   sodium chloride  flush (NS) 0.9 % injection 3 mL, 3 mL, Intravenous, Q12H, Fernand Prost, MD, 3 mL at 05/27/24 1021   spironolactone  (ALDACTONE ) tablet 25 mg, 25 mg, Oral, Daily, Kolluru, Sarath, MD, 25 mg at 05/27/24 1021 [2]  Allergies Allergen Reactions   Percocet [Oxycodone -Acetaminophen ] Nausea Only

## 2024-05-27 NOTE — Progress Notes (Signed)
 " PROGRESS NOTE   HPI was taken from Dr. Fernand: Derek Garza is a 63 y.o. year old male with medical history of HTN, HLD, DMII, BPH presenting to the ED with worsening scrotal swelling and fatigue.    Patient reports he has been having worsening swelling over the last month.  He states he has been gaining weight steadily over this time.  He does not report decreasing urination.  He states during this time he has been feeling worsening malaise and weakness.  He denies any URI symptoms.  He denies any fevers or chills. He has significant anxiety regarding underlying reason behind this. He states his mother had bone cancer but not sure what age she had this. No hx of testicular cancer in the family.      On arrival to the ED patient was noted to be HDS stable. Lab work and imaging obtained. CBC with leukopenia, and anemia that was not previously present. MCV elevated at 117.  CMP shows hypokalemia at 3.1, decreased calcium  level, decreased protein levels and albumins. ALP elevated at 168. UA with glucosuria, and proteinuria. TSH wnl. Scrotal ultrasound shows large hydrocele with left testicle being atrophic with calcifications with some concern for malignancy. Given pts presentation, [facility] contacted for admission.   Josemiguel Gries  FMW:[id number] DOB: 06-Apr-1961 DOA: 05/20/2024 PCP: Valerio Melanie [name], NP    Assessment & Plan:   Principal Problem:   Anasarca Active Problems:   DM type 2 with diabetic peripheral neuropathy (HCC)   Hypertension associated with diabetes (HCC)   Hypogonadism in male   Bipolar 2 disorder (HCC)   Hyperlipidemia associated with type 2 diabetes mellitus (HCC)   Benign prostatic hyperplasia (BPH) with straining on urination  Assessment and Plan: Scrotal swelling: etiology unclear. Tumor markers are all neg but light chains are abnormal. Multiple myeloma panel is pending.  Cr is still WNL   Leukopenia: resolved. Bone marrow shows shows a lot of immature, white blood  cells and red cell- suggestive of regenerating bone marrow from toxic insult, likely from tegretal and possibly seroquel . Not suggestive of leukemia. No PET scan needed as per onco   Anasarca: with nephrotic range proteinuria. Etiology unclear. Will need a kidney biopsy as per nephro. Bone marrow biopsy results as stated above   Macrocytic anemia: folate is WNL and B12 >2000. S/p 1 unit of pRBCs transfused so far. Will transfuse again if Hb < 7.0    HTN: BP is WNL currently    DM2: well controlled, HbA1c 4.5. Diet controlled   Peripheral neuropathy: continue on home dose of gabapentin    Bipolar disorder: continue on home dose of tegretal & seroquel  as pt is NOT agreeable to stopping or reducing doses of either management and will f/u outpatient w/ his psychiatrist. Psych recs apprec   HLD: continue on statin    BPH: no longer taking tamsulosin  as per med rec     DVT prophylaxis: SCDs Code Status: full  Family Communication:  Disposition Plan: likely d/c back home .  Level of care: Telemetry  Status is: Inpatient Remains inpatient appropriate because: severity of illness, will need kidney biopsy as per nephro     Consultants:  Psych Onco  Nephro   Procedures:   Antimicrobials:   Subjective: Pt c/o malaise   Objective: Vitals:   05/27/24 0417 05/27/24 0500 05/27/24 0512 05/27/24 0733  BP: (!) 97/46  126/71 (!) 118/54  Pulse: 92   97  Resp: 20   16  Temp: 98.3 F (  36.8 C)   98.3 F (36.8 C)  TempSrc:    Oral  SpO2: 99%   98%  Weight:  96.9 kg    Height:       No intake or output data in the 24 hours ending 05/27/24 0933  Filed Weights   05/25/24 0500 05/26/24 0500 05/27/24 0500  Weight: 96 kg 90.7 kg 96.9 kg    Examination:  General exam: appears calm and comfortable  Respiratory system: clear breath sounds b/l  Cardiovascular system: S1 & S2+. No rubs or gallops  Gastrointestinal system: abd is soft, NT, distended & hypoactive bowel sounds   Central nervous system: alert & oriented. Moves all extremities  Psychiatry: Judgement and insight appears at baseline. Flat mood and affect     Data Reviewed: I have personally reviewed following labs and imaging studies  CBC: Recent Labs  Lab 05/22/24 0457 05/23/24 0546 05/23/24 1117 05/24/24 0513 05/26/24 0619  WBC 7.7 7.3 7.4 5.1 4.8  NEUTROABS 5.2 4.8 4.4 2.5  --   HGB 9.5* 8.8* 9.7* 9.0* 8.0*  HCT 30.0* 27.7* 30.2* 28.7* 24.7*  MCV 114.9* 112.1* 110.6* 113.9* 109.3*  PLT 312 330 374 325 309   Basic Metabolic Panel: Recent Labs  Lab 05/20/24 1816 05/21/24 0534 05/22/24 0457 05/24/24 0513 05/26/24 0619  NA  --  135 134* 133* 132*  K  --  3.2* 4.1 3.6 4.0  CL  --  102 99 98 96*  CO2  --  28 25 28 27   GLUCOSE  --  85 77 177* 85  BUN  --  11 11 13 12   CREATININE  --  0.61 0.69 0.78 0.72  CALCIUM   --  7.2* 7.7* 7.7* 8.2*  MG 1.9  --   --   --   --    GFR: Estimated Creatinine Clearance: 112.5 mL/min (by C-G formula based on SCr of 0.72 mg/dL). Liver Function Tests: Recent Labs  Lab 05/24/24 0859 05/26/24 0619  ALBUMIN  2.3* 2.9*   No results for input(s): LIPASE, AMYLASE in the last 168 hours. No results for input(s): AMMONIA in the last 168 hours. Coagulation Profile: Recent Labs  Lab 05/21/24 1047  INR 1.0   Cardiac Enzymes: No results for input(s): CKTOTAL, CKMB, CKMBINDEX, TROPONINI in the last 168 hours. BNP (last 3 results) Recent Labs    05/20/24 1816  PROBNP 515.0*   HbA1C: No results for input(s): HGBA1C in the last 72 hours. CBG: Recent Labs  Lab 05/26/24 0846 05/26/24 1145 05/26/24 1609 05/26/24 2139 05/27/24 0732  GLUCAP 109* 117* 147* 118* 159*   Lipid Profile: No results for input(s): CHOL, HDL, LDLCALC, TRIG, CHOLHDL, LDLDIRECT in the last 72 hours. Thyroid Function Tests: No results for input(s): TSH, T4TOTAL, FREET4, T3FREE, THYROIDAB in the last 72 hours. Anemia Panel: No results  for input(s): VITAMINB12, FOLATE, FERRITIN, TIBC, IRON, RETICCTPCT in the last 72 hours. Sepsis Labs: No results for input(s): PROCALCITON, LATICACIDVEN in the last 168 hours.  Recent Results (from the past 240 hours)  Microscopic Examination     Status: None   Collection Time: 05/19/24 11:35 AM   Urine  Result Value Ref Range Status   WBC, UA None seen 0 - 5 /hpf Final   RBC, Urine 0-2 0 - 2 /hpf Final   Epithelial Cells (non renal) None seen 0 - 10 /hpf Final   Bacteria, UA None seen None seen/Few Final  Urine Culture     Status: None   Collection Time: 05/19/24  1:19 PM   Specimen: Urine   UR  Result Value Ref Range Status   Urine Culture, Routine Final report  Final   Organism ID, Bacteria Comment  Final    Comment: Mixed urogenital flora Less than 10,000 colonies/mL          Radiology Studies: No results found.      Scheduled Meds:  atorvastatin   10 mg Oral Daily   carbamazepine   400 mg Oral BID   furosemide   80 mg Intravenous BID   gabapentin   600 mg Oral BID   insulin  aspart  0-15 Units Subcutaneous TID WC   insulin  aspart  0-5 Units Subcutaneous QHS   QUEtiapine   800 mg Oral QHS   sodium chloride  flush  3 mL Intravenous Q12H   spironolactone   25 mg Oral Daily   Continuous Infusions:     LOS: 5 days       Anthony CHRISTELLA Pouch, MD [facility] Pager [phone]  If 7PM-7AM, please contact night-coverage [email or web address] 05/27/2024, 9:33 AM   "

## 2024-05-27 NOTE — Telephone Encounter (Signed)
 Copied from CRM [id number]. Topic: General - Other >> May 27, 2024  4:14 PM Victoria B wrote: Reason for CRM: Patient called in asking to speak with dr Valerio about what's going on at the ER not happy with dx going on. I let him  know dr Valerio isnt available and it would be best if he speaks to someone at the hospital but patient insists on speaking with Dr valerio

## 2024-05-27 NOTE — Progress Notes (Signed)
 " [facility]  ROUNDING NOTE   Subjective:   Patient seen ambulating in room  Continues to have generalized edema   Objective:  Vital signs in last 24 hours:  Temp:  [98.3 F (36.8 C)] 98.3 F (36.8 C) (12/30 0733) Pulse Rate:  [81-97] 97 (12/30 0733) Resp:  [16-20] 16 (12/30 0733) BP: (97-133)/(46-71) 118/54 (12/30 0733) SpO2:  [98 %-99 %] 98 % (12/30 0733) Weight:  [96.9 kg] 96.9 kg (12/30 0500)  Weight change: 6.2 kg Filed Weights   05/25/24 0500 05/26/24 0500 05/27/24 0500  Weight: 96 kg 90.7 kg 96.9 kg    Intake/Output: No intake/output data recorded.   Intake/Output this shift:  No intake/output data recorded.  Physical Exam: General: NAD  Head: Normocephalic  Eyes: Anicteric  Lungs:  Clear, on room air  Heart: Regular rate  Abdomen:  Soft, nontender,   Extremities:  ++ peripheral edema  Neurologic: Alert, conversant  Skin: No lesions  GU scrotal edema  Access: none    Basic Metabolic Panel: Recent Labs  Lab 05/20/24 1816 05/21/24 0534 05/21/24 0534 05/22/24 0457 05/24/24 0513 05/26/24 0619  NA  --  135  --  134* 133* 132*  K  --  3.2*  --  4.1 3.6 4.0  CL  --  102  --  99 98 96*  CO2  --  28  --  25 28 27   GLUCOSE  --  85  --  77 177* 85  BUN  --  11  --  11 13 12   CREATININE  --  0.61  --  0.69 0.78 0.72  CALCIUM   --  7.2*   < > 7.7* 7.7* 8.2*  MG 1.9  --   --   --   --   --    < > = values in this interval not displayed.    Liver Function Tests: Recent Labs  Lab 05/24/24 0859 05/26/24 0619  ALBUMIN  2.3* 2.9*   No results for input(s): LIPASE, AMYLASE in the last 168 hours. No results for input(s): AMMONIA in the last 168 hours.  CBC: Recent Labs  Lab 05/22/24 0457 05/23/24 0546 05/23/24 1117 05/24/24 0513 05/26/24 0619  WBC 7.7 7.3 7.4 5.1 4.8  NEUTROABS 5.2 4.8 4.4 2.5  --   HGB 9.5* 8.8* 9.7* 9.0* 8.0*  HCT 30.0* 27.7* 30.2* 28.7* 24.7*  MCV 114.9* 112.1* 110.6* 113.9* 109.3*  PLT 312 330 374 325  309    Cardiac Enzymes: No results for input(s): CKTOTAL, CKMB, CKMBINDEX, TROPONINI in the last 168 hours.  BNP: Invalid input(s): POCBNP  CBG: Recent Labs  Lab 05/26/24 1145 05/26/24 1609 05/26/24 2139 05/27/24 0732 05/27/24 1131  GLUCAP 117* 147* 118* 159* 91    Microbiology: Results for orders placed or performed in visit on 05/19/24  Microscopic Examination     Status: None   Collection Time: 05/19/24 11:35 AM   Urine  Result Value Ref Range Status   WBC, UA None seen 0 - 5 /hpf Final   RBC, Urine 0-2 0 - 2 /hpf Final   Epithelial Cells (non renal) None seen 0 - 10 /hpf Final   Bacteria, UA None seen None seen/Few Final  Urine Culture     Status: None   Collection Time: 05/19/24  1:19 PM   Specimen: Urine   UR  Result Value Ref Range Status   Urine Culture, Routine Final report  Final   Organism ID, Bacteria Comment  Final    Comment:  Mixed urogenital flora Less than 10,000 colonies/mL     Coagulation Studies: No results for input(s): LABPROT, INR in the last 72 hours.   Urinalysis: No results for input(s): COLORURINE, LABSPEC, PHURINE, GLUCOSEU, HGBUR, BILIRUBINUR, KETONESUR, PROTEINUR, UROBILINOGEN, NITRITE, LEUKOCYTESUR in the last 72 hours.  Invalid input(s): APPERANCEUR     Imaging: No results found.    Medications:      atorvastatin   10 mg Oral Daily   carbamazepine   400 mg Oral BID   furosemide   40 mg Intravenous TID   gabapentin   600 mg Oral BID   insulin  aspart  0-15 Units Subcutaneous TID WC   insulin  aspart  0-5 Units Subcutaneous QHS   QUEtiapine   800 mg Oral QHS   sodium chloride  flush  3 mL Intravenous Q12H   spironolactone   25 mg Oral Daily   acetaminophen  **OR** acetaminophen , ondansetron  **OR** ondansetron  (ZOFRAN ) IV, oxyCODONE , senna-docusate  Assessment/ Plan:  [name] is a 63 y.o.  male with hypertension, hyperlipidemia, diabetes mellitus type II, and BPH, who was  admitted to [facility] on 05/20/2024 for Anasarca [R60.1] Scrotal swelling [N50.89] Anemia, unspecified type [D64.9] Leukopenia, unspecified type [D72.819]   Anasarca with nephrotic range proteinuria  No relative change in edema. Were considering transitioning to Furosemide  drip, however will continue IV Furosemide  80mg  twice daily with albumin . Patient agreeable to renal biopsy to assist with diagnosis. US  will schedule procedure tomorrow or Friday  Hypertension, essential  Currently of all antihypertensives  BP remains acceptable   Diabetes mellitus type II with renal manifestations  On sliding scale insulin   Hypoalbuminemia  Albumin  2.9.  Continue Albumin  twice daily  5. Anemia, Macrocytic Will monitor need for ESA  Latest Reference Range & Units 05/24/24 05:13  Hemoglobin 13.0 - 17.0 g/dL 9.0 (L)  HCT 60.9 - 47.9 % 28.7 (L)  MCV 80.0 - 100.0 fL 113.9 (H)  MCH 26.0 - 34.0 pg 35.7 (H)  MCHC 30.0 - 36.0 g/dL 68.5  RDW 88.4 - 84.4 % 18.2 (H)    Differential includes acute glomerulopathy, myeloplastic disease, progressive diabetic nephropathy, hepatic cirrhosis or congestive heart failure.  - serologic work up negative.  - appreciate hematology input. Bone marrow aspiration completed    LOS: 5 Derek Garza 12/30/20253:38 PM   "

## 2024-05-27 NOTE — Care Management Important Message (Signed)
 Important Message  Patient Details  Name: Derek Garza MRN: [id number] Date of Birth: July 20, 1960   Important Message Given:  Yes - Medicare IM     Rojelio [name] 05/27/2024, 2:03 PM

## 2024-05-27 NOTE — Progress Notes (Signed)
 Derek Garza   DOB:1961-05-22   FM#:[id number]    Subjective: Patient status post bone marrow biopsy last week  States he has been urinating a lot since being on diuretics.  Noticed to have some improvement of his leg swelling and abdominal distention and also scrotal swelling.  Denies any fevers or chills.  No nausea no vomiting.  No bleeding.  Patient quite upset with the recommendations to alter his psychiatric medication.  Objective:  Vitals:   05/27/24 1626 05/27/24 1930  BP: (!) 140/73 133/75  Pulse: 85 82  Resp: 17 16  Temp: 99.1 F (37.3 C) 98.4 F (36.9 C)  SpO2: 98% 99%     Intake/Output Summary (Last 24 hours) at 05/27/2024 1956 Last data filed at 05/27/2024 1907 Gross per 24 hour  Intake --  Output 575 ml  Net -575 ml     Physical Exam Vitals and nursing note reviewed.  HENT:     Head: Normocephalic and atraumatic.     Mouth/Throat:     Pharynx: Oropharynx is clear.  Eyes:     Extraocular Movements: Extraocular movements intact.     Pupils: Pupils are equal, round, and reactive to light.  Cardiovascular:     Rate and Rhythm: Normal rate and regular rhythm.  Pulmonary:     Comments: Decreased breath sounds bilaterally.  Abdominal:     Palpations: Abdomen is soft.  Musculoskeletal:        General: Normal range of motion.     Cervical back: Normal range of motion.  Skin:    General: Skin is warm.  Neurological:     General: No focal deficit present.     Mental Status: He is alert and oriented to person, place, and time.  Psychiatric:        Behavior: Behavior normal.        Judgment: Judgment normal.      Labs:  Lab Results  Component Value Date   WBC 4.8 05/26/2024   HGB 8.0 (L) 05/26/2024   HCT 24.7 (L) 05/26/2024   MCV 109.3 (H) 05/26/2024   PLT 309 05/26/2024   NEUTROABS 2.5 05/24/2024    Lab Results  Component Value Date   NA 132 (L) 05/26/2024   K 4.0 05/26/2024   CL 96 (L) 05/26/2024   CO2 27 05/26/2024    Studies:  No results  found.   No problem-specific Assessment & Plan notes found for this encounter.  # 63 year old male patient with history of bipolar disorder psychiatric medication-is currently admitted to the hospital for generalized swelling including leg swelling-with severe neutropenia anemia   # Severe neutropenia/anemia- DEC 26th, 2025-bone marrow biopsy negative for any acute leukemia; immature granulocytes/promyelocytes; immature erythrocyte series noted-suggestive of regenerative bone marrow from suspected bone marrow insult. Differential diagnosis includes-toxicity from Tegretol /Seroquel -enhanced toxicity from hypoalbuminemia.  NGS pending/MDS low clinical suspicion.  Will check copper levels.   # Nephrotic range proteinuria-normal renal function.  Unclear etiology.  Status post evaluation with nephrology currently on diuretics.  Awaiting possible kidney biopsy.   # Bipolar disorder-patient on Tegretol /Seroquel -question causing hematologic toxicity-defer to primary service/psychiatry for further management.  Discussed with psychiatry and primary team along with nephrology.   Derek JONELLE Joe, MD 05/27/2024  7:56 PM

## 2024-05-28 ENCOUNTER — Inpatient Hospital Stay

## 2024-05-28 DIAGNOSIS — R601 Generalized edema: Secondary | ICD-10-CM | POA: Diagnosis not present

## 2024-05-28 LAB — PROTIME-INR
INR: 0.9 (ref 0.8–1.2)
Prothrombin Time: 13 s (ref 11.4–15.2)

## 2024-05-28 LAB — BASIC METABOLIC PANEL WITH GFR
Anion gap: 8 (ref 5–15)
BUN: 15 mg/dL (ref 8–23)
CO2: 29 mmol/L (ref 22–32)
Calcium: 8 mg/dL — ABNORMAL LOW (ref 8.9–10.3)
Chloride: 94 mmol/L — ABNORMAL LOW (ref 98–111)
Creatinine, Ser: 0.82 mg/dL (ref 0.61–1.24)
GFR, Estimated: >60 mL/min
Glucose, Bld: 126 mg/dL — ABNORMAL HIGH (ref 70–99)
Potassium: 4.1 mmol/L (ref 3.5–5.1)
Sodium: 132 mmol/L — ABNORMAL LOW (ref 135–145)

## 2024-05-28 LAB — MISC LABCORP TEST (SEND OUT): Labcorp test code: 141330

## 2024-05-28 LAB — GLUCOSE, CAPILLARY
Glucose-Capillary: 107 mg/dL — ABNORMAL HIGH (ref 70–99)
Glucose-Capillary: 112 mg/dL — ABNORMAL HIGH (ref 70–99)
Glucose-Capillary: 128 mg/dL — ABNORMAL HIGH (ref 70–99)
Glucose-Capillary: 138 mg/dL — ABNORMAL HIGH (ref 70–99)

## 2024-05-28 LAB — CBC
HCT: 28.7 % — ABNORMAL LOW (ref 39.0–52.0)
Hemoglobin: 9 g/dL — ABNORMAL LOW (ref 13.0–17.0)
MCH: 35.3 pg — ABNORMAL HIGH (ref 26.0–34.0)
MCHC: 31.4 g/dL (ref 30.0–36.0)
MCV: 112.5 fL — ABNORMAL HIGH (ref 80.0–100.0)
Platelets: 343 K/uL (ref 150–400)
RBC: 2.55 MIL/uL — ABNORMAL LOW (ref 4.22–5.81)
RDW: 17.2 % — ABNORMAL HIGH (ref 11.5–15.5)
WBC: 4.4 K/uL (ref 4.0–10.5)
nRBC: 0 % (ref 0.0–0.2)

## 2024-05-28 MED ORDER — FENTANYL CITRATE (PF) 100 MCG/2ML IJ SOLN
INTRAMUSCULAR | Status: AC | PRN
  Administered 2024-05-28: 50 ug via INTRAVENOUS

## 2024-05-28 MED ORDER — FENTANYL CITRATE (PF) 100 MCG/2ML IJ SOLN
INTRAMUSCULAR | Status: AC
  Filled 2024-05-28: qty 2

## 2024-05-28 MED ORDER — MIDAZOLAM HCL 5 MG/5ML IJ SOLN
INTRAMUSCULAR | Status: AC | PRN
  Administered 2024-05-28: 1 mg via INTRAVENOUS

## 2024-05-28 MED ORDER — MIDAZOLAM HCL 2 MG/2ML IJ SOLN
INTRAMUSCULAR | Status: AC
  Filled 2024-05-28: qty 2

## 2024-05-28 MED ORDER — LIDOCAINE HCL (PF) 1 % IJ SOLN
8.0000 mL | Freq: Once | INTRAMUSCULAR | Status: AC
  Administered 2024-05-28: 8 mL via INTRADERMAL

## 2024-05-28 NOTE — Plan of Care (Signed)
" °  Problem: Education: Goal: Knowledge of General Education information will improve Description: Including pain rating scale, medication(s)/side effects and non-pharmacologic comfort measures Outcome: Progressing   Problem: Health Behavior/Discharge Planning: Goal: Ability to manage health-related needs will improve Outcome: Progressing   Problem: Clinical Measurements: Goal: Ability to maintain clinical measurements within normal limits will improve Outcome: Progressing Goal: Will remain free from infection Outcome: Progressing Goal: Diagnostic test results will improve Outcome: Progressing Goal: Respiratory complications will improve Outcome: Progressing Goal: Cardiovascular complication will be avoided Outcome: Progressing   Problem: Nutrition: Goal: Adequate nutrition will be maintained Outcome: Progressing   Problem: Activity: Goal: Risk for activity intolerance will decrease Outcome: Progressing   Problem: Coping: Goal: Level of anxiety will decrease Outcome: Progressing   Problem: Elimination: Goal: Will not experience complications related to bowel motility Outcome: Progressing Goal: Will not experience complications related to urinary retention Outcome: Progressing   Problem: Pain Managment: Goal: General experience of comfort will improve and/or be controlled Outcome: Progressing   Problem: Safety: Goal: Ability to remain free from injury will improve Outcome: Progressing   Problem: Skin Integrity: Goal: Risk for impaired skin integrity will decrease Outcome: Progressing   Problem: Education: Goal: Ability to describe self-care measures that may prevent or decrease complications (Diabetes Survival Skills Education) will improve Outcome: Progressing Goal: Individualized Educational Video(s) Outcome: Progressing   Problem: Coping: Goal: Ability to adjust to condition or change in health will improve Outcome: Progressing   Problem: Fluid  Volume: Goal: Ability to maintain a balanced intake and output will improve Outcome: Progressing   Problem: Metabolic: Goal: Ability to maintain appropriate glucose levels will improve Outcome: Progressing   Problem: Health Behavior/Discharge Planning: Goal: Ability to identify and utilize available resources and services will improve Outcome: Progressing Goal: Ability to manage health-related needs will improve Outcome: Progressing   Problem: Nutritional: Goal: Maintenance of adequate nutrition will improve Outcome: Progressing Goal: Progress toward achieving an optimal weight will improve Outcome: Progressing   Problem: Skin Integrity: Goal: Risk for impaired skin integrity will decrease Outcome: Progressing   Problem: Tissue Perfusion: Goal: Adequacy of tissue perfusion will improve Outcome: Progressing   "

## 2024-05-28 NOTE — Plan of Care (Signed)

## 2024-05-28 NOTE — Progress Notes (Signed)
 Patients sister Dorothyann called and was updated on patient condition.

## 2024-05-28 NOTE — Progress Notes (Signed)
 "    Progress Note    Derek Garza  FMW:[id number] DOB: 13-May-1961  DOA: 05/20/2024 PCP: Valerio Melanie [name], NP      Brief Narrative:    Medical records reviewed and are as summarized below:  Derek Garza is a 63 y.o. male with medical history of HTN, HLD, DMII, BPH, who presented to the hospital with fatigue, general body swelling including abdomen, lower extremities and scrotum.  He also reported progressive weight gain, decreased urination and general weakness.     Assessment/Plan:   Principal Problem:   Anasarca Active Problems:   DM type 2 with diabetic peripheral neuropathy (HCC)   Hypertension associated with diabetes (HCC)   Hypogonadism in male   Bipolar 2 disorder (HCC)   Hyperlipidemia associated with type 2 diabetes mellitus (HCC)   Benign prostatic hyperplasia (BPH) with straining on urination     Body mass index is 29.45 kg/m.    Anasarca with nephrotic range proteinuria: S/p left kidney biopsy on 05/28/2024.  Follow-up pathology report. He is on IV Lasix  and spironolactone .  Follow-up with nephrology for further recommendations.   Scrotal swelling, right hydrocele: Diffuse scrotal swelling may be related to hypoalbuminemia.  Outpatient follow-up recommended for evaluation of right hydrocele.  Leukopenia, macrocytic anemia: Leukopenia has resolved. S/p left iliac bone marrow biopsy on 05/23/2024.  No evidence of acute leukemia.  Immature granulocytes/promyelocytes, immature erythrocyte series noted suggestive of regenerative bone marrow from suspected bone marrow insult per hematologist.  Differential diagnoses include toxicity from Tegretol /Seroquel   enhanced toxicity from hypoalbuminemia. Of note, patient does not want to discontinue psychotropics. Appreciate input from hematologist.   Bipolar disorder: Patient is on Tegretol  and Seroquel .  He does not want to discontinue these medicines.  He prefers to follow-up with his oral patient  psychiatrist. Appreciate input from psychiatrist.   Comorbidities include type II DM, hypertension, hyperlipidemia, BPH, peripheral neuropathy     Diet Order             Diet heart healthy/carb modified Room service appropriate? Yes; Fluid consistency: Thin  Diet effective now                                  Consultants: Nephrologist Psychiatrist Oncologist Interventional radiologist  Procedures: Left renal biopsy on 05/28/2024 Left iliac bone marrow biopsy on 05/23/2024    Medications:    atorvastatin   10 mg Oral Daily   carbamazepine   400 mg Oral BID   fentaNYL        furosemide   40 mg Intravenous TID   gabapentin   600 mg Oral BID   insulin  aspart  0-15 Units Subcutaneous TID WC   insulin  aspart  0-5 Units Subcutaneous QHS   midazolam        QUEtiapine   800 mg Oral QHS   sodium chloride  flush  3 mL Intravenous Q12H   spironolactone   25 mg Oral Daily   Continuous Infusions:   Anti-infectives (From admission, onward)    None              Family Communication/Anticipated D/C date and plan/Code Status   DVT prophylaxis: SCDs Start: 05/20/24 1752     Code Status: Full Code  Family Communication: None Disposition Plan: Plan to discharge home   Status is: Inpatient Remains inpatient appropriate because: Anasarca, nephrotic range proteinuria       Subjective:   Interval events noted.  He still complains of significant lower extremity swelling and  abdominal swelling.  Lauren, RN, the bedside  Objective:    Vitals:   05/28/24 1130 05/28/24 1145 05/28/24 1225 05/28/24 1532  BP: (!) 96/48 (!) 105/51 132/82 125/67  Pulse: 74 81 85 85  Resp: (!) 9 13  17   Temp:   97.8 F (36.6 C) 98.2 F (36.8 C)  TempSrc:   Oral   SpO2: 99% 96% 100% 97%  Weight:      Height:       No data found.   Intake/Output Summary (Last 24 hours) at 05/28/2024 1609 Last data filed at 05/28/2024 1319 Gross per 24 hour  Intake --   Output 2870 ml  Net -2870 ml   Filed Weights   05/27/24 0500 05/27/24 1700 05/28/24 0237  Weight: 96.9 kg 98.1 kg 98.5 kg    Exam:  GEN: NAD SKIN: Warm and dry.  Dressing on left renal biopsy site is clean and dry  on left renal biopsy site is dry EYES: No pallor or icterus ENT: MMM CV: RRR PULM: CTA B ABD: soft, distended, NT, +BS CNS: AAO x 3, non focal EXT: Significant bilateral lower extremity edema from the thighs to the feet        Data Reviewed:   I have personally reviewed following labs and imaging studies:  Labs: Labs show the following:   Basic Metabolic Panel: Recent Labs  Lab 05/22/24 0457 05/24/24 0513 05/26/24 0619 05/28/24 0521  NA 134* 133* 132* 132*  K 4.1 3.6 4.0 4.1  CL 99 98 96* 94*  CO2 25 28 27 29   GLUCOSE 77 177* 85 126*  BUN 11 13 12 15   CREATININE 0.69 0.78 0.72 0.82  CALCIUM  7.7* 7.7* 8.2* 8.0*   GFR Estimated Creatinine Clearance: 110.7 mL/min (by C-G formula based on SCr of 0.82 mg/dL). Liver Function Tests: Recent Labs  Lab 05/24/24 0859 05/26/24 0619  ALBUMIN  2.3* 2.9*   No results for input(s): LIPASE, AMYLASE in the last 168 hours. No results for input(s): AMMONIA in the last 168 hours. Coagulation profile Recent Labs  Lab 05/28/24 0521  INR 0.9    CBC: Recent Labs  Lab 05/22/24 0457 05/23/24 0546 05/23/24 1117 05/24/24 0513 05/26/24 0619 05/28/24 0521  WBC 7.7 7.3 7.4 5.1 4.8 4.4  NEUTROABS 5.2 4.8 4.4 2.5  --   --   HGB 9.5* 8.8* 9.7* 9.0* 8.0* 9.0*  HCT 30.0* 27.7* 30.2* 28.7* 24.7* 28.7*  MCV 114.9* 112.1* 110.6* 113.9* 109.3* 112.5*  PLT 312 330 374 325 309 343   Cardiac Enzymes: No results for input(s): CKTOTAL, CKMB, CKMBINDEX, TROPONINI in the last 168 hours. BNP (last 3 results) Recent Labs    05/20/24 1816  PROBNP 515.0*   CBG: Recent Labs  Lab 05/27/24 1131 05/27/24 1627 05/27/24 2128 05/28/24 0803 05/28/24 1238  GLUCAP 91 112* 174* 107* 112*   D-Dimer: No  results for input(s): DDIMER in the last 72 hours. Hgb A1c: No results for input(s): HGBA1C in the last 72 hours. Lipid Profile: No results for input(s): CHOL, HDL, LDLCALC, TRIG, CHOLHDL, LDLDIRECT in the last 72 hours. Thyroid function studies: No results for input(s): TSH, T4TOTAL, T3FREE, THYROIDAB in the last 72 hours.  Invalid input(s): FREET3 Anemia work up: No results for input(s): VITAMINB12, FOLATE, FERRITIN, TIBC, IRON, RETICCTPCT in the last 72 hours. Sepsis Labs: Recent Labs  Lab 05/23/24 1117 05/24/24 0513 05/26/24 0619 05/28/24 0521  WBC 7.4 5.1 4.8 4.4    Microbiology Recent Results (from the past 240 hours)  Microscopic  Examination     Status: None   Collection Time: 05/19/24 11:35 AM   Urine  Result Value Ref Range Status   WBC, UA None seen 0 - 5 /hpf Final   RBC, Urine 0-2 0 - 2 /hpf Final   Epithelial Cells (non renal) None seen 0 - 10 /hpf Final   Bacteria, UA None seen None seen/Few Final  Urine Culture     Status: None   Collection Time: 05/19/24  1:19 PM   Specimen: Urine   UR  Result Value Ref Range Status   Urine Culture, Routine Final report  Final   Organism ID, Bacteria Comment  Final    Comment: Mixed urogenital flora Less than 10,000 colonies/mL     Procedures and diagnostic studies:  US  BIOPSY (KIDNEY) Result Date: 05/28/2024 INDICATION: Nephrotic range proteinuria EXAM: Ultrasound-guided biopsy MEDICATIONS: None. ANESTHESIA/SEDATION: Moderate (conscious) sedation was employed during this procedure. A total of Versed  1 mg and Fentanyl  50 mcg was administered intravenously by the radiology nurse. Total intra-service moderate Sedation Time: 16 minutes. The patient's level of consciousness and vital signs were monitored continuously by radiology nursing throughout the procedure under my direct supervision. COMPLICATIONS: None immediate. PROCEDURE: Informed written consent was obtained from the patient  after a thorough discussion of the procedural risks, benefits and alternatives. All questions were addressed. Maximal Sterile Barrier Technique was utilized including caps, mask, sterile gowns, sterile gloves, sterile drape, hand hygiene and skin antiseptic. A timeout was performed prior to the initiation of the procedure. In a prone position both flank regions were evaluated for percutaneous access. The left kidney was better visualized and chosen for the target of the renal biopsy. The left flank region was then prepped and draped in usual sterile fashion. Local anesthesia was achieved with 1% lidocaine . Small incision was made in the left flank region. A 17 gauge access needle was then advanced through the incision under ultrasound guidance to the lower pole the left kidney. The needle was advanced into the renal parenchyma. The stylet was removed. An 18 gauge BioPince needle was then advanced under ultrasound guidance through the introducer needle and 2 core needle biopsies were obtained in sequential fashion. A small amount of Gel-Foam was then injected through the access needle and the needle was removed. Final image of the left kidney demonstrates no acute hemorrhage. Sample sent to laboratory for further evaluation and processing. IMPRESSION: Satisfactory core needle biopsy of the lower pole of the left kidney. Electronically Signed   By: Cordella Banner   On: 05/28/2024 14:40               LOS: 6 days   Rishika Mccollom  [facility]   Pager on [email or web address]. If 7PM-7AM, please contact night-coverage at [email or web address]     05/28/2024, 4:09 PM           "

## 2024-05-28 NOTE — Progress Notes (Signed)
 " [facility]  ROUNDING NOTE   Subjective:   Patient seen post renal biopsy Drowsy from sedation.    Objective:  Vital signs in last 24 hours:  Temp:  [97.6 F (36.4 C)-99.1 F (37.3 C)] 97.8 F (36.6 C) (12/31 1225) Pulse Rate:  [74-98] 85 (12/31 1225) Resp:  [9-18] 13 (12/31 1145) BP: (96-140)/(48-83) 132/82 (12/31 1225) SpO2:  [95 %-100 %] 100 % (12/31 1225) Weight:  [98.1 kg-98.5 kg] 98.5 kg (12/31 0237)  Weight change: 1.2 kg Filed Weights   05/27/24 0500 05/27/24 1700 05/28/24 0237  Weight: 96.9 kg 98.1 kg 98.5 kg    Intake/Output: I/O last 3 completed shifts: In: -  Out: 1675 [Urine:1675]   Intake/Output this shift:  Total I/O In: -  Out: 1195 [Urine:1195]  Physical Exam: General: NAD  Head: Normocephalic  Eyes: Anicteric  Lungs:  Clear, on room air  Heart: Regular rate  Abdomen:  Soft, nontender,   Extremities:  ++ peripheral edema  Neurologic: Alert, conversant  Skin: No lesions  GU scrotal edema  Access: none    Basic Metabolic Panel: Recent Labs  Lab 05/22/24 0457 05/24/24 0513 05/26/24 0619 05/28/24 0521  NA 134* 133* 132* 132*  K 4.1 3.6 4.0 4.1  CL 99 98 96* 94*  CO2 25 28 27 29   GLUCOSE 77 177* 85 126*  BUN 11 13 12 15   CREATININE 0.69 0.78 0.72 0.82  CALCIUM  7.7* 7.7* 8.2* 8.0*    Liver Function Tests: Recent Labs  Lab 05/24/24 0859 05/26/24 0619  ALBUMIN  2.3* 2.9*   No results for input(s): LIPASE, AMYLASE in the last 168 hours. No results for input(s): AMMONIA in the last 168 hours.  CBC: Recent Labs  Lab 05/22/24 0457 05/23/24 0546 05/23/24 1117 05/24/24 0513 05/26/24 0619 05/28/24 0521  WBC 7.7 7.3 7.4 5.1 4.8 4.4  NEUTROABS 5.2 4.8 4.4 2.5  --   --   HGB 9.5* 8.8* 9.7* 9.0* 8.0* 9.0*  HCT 30.0* 27.7* 30.2* 28.7* 24.7* 28.7*  MCV 114.9* 112.1* 110.6* 113.9* 109.3* 112.5*  PLT 312 330 374 325 309 343    Cardiac Enzymes: No results for input(s): CKTOTAL, CKMB, CKMBINDEX,  TROPONINI in the last 168 hours.  BNP: Invalid input(s): POCBNP  CBG: Recent Labs  Lab 05/27/24 1131 05/27/24 1627 05/27/24 2128 05/28/24 0803 05/28/24 1238  GLUCAP 91 112* 174* 107* 112*    Microbiology: Results for orders placed or performed in visit on 05/19/24  Microscopic Examination     Status: None   Collection Time: 05/19/24 11:35 AM   Urine  Result Value Ref Range Status   WBC, UA None seen 0 - 5 /hpf Final   RBC, Urine 0-2 0 - 2 /hpf Final   Epithelial Cells (non renal) None seen 0 - 10 /hpf Final   Bacteria, UA None seen None seen/Few Final  Urine Culture     Status: None   Collection Time: 05/19/24  1:19 PM   Specimen: Urine   UR  Result Value Ref Range Status   Urine Culture, Routine Final report  Final   Organism ID, Bacteria Comment  Final    Comment: Mixed urogenital flora Less than 10,000 colonies/mL     Coagulation Studies: Recent Labs    05/28/24 0521  LABPROT 13.0  INR 0.9     Urinalysis: No results for input(s): COLORURINE, LABSPEC, PHURINE, GLUCOSEU, HGBUR, BILIRUBINUR, KETONESUR, PROTEINUR, UROBILINOGEN, NITRITE, LEUKOCYTESUR in the last 72 hours.  Invalid input(s): APPERANCEUR  Imaging: No results found.    Medications:      atorvastatin   10 mg Oral Daily   carbamazepine   400 mg Oral BID   fentaNYL        furosemide   40 mg Intravenous TID   gabapentin   600 mg Oral BID   insulin  aspart  0-15 Units Subcutaneous TID WC   insulin  aspart  0-5 Units Subcutaneous QHS   midazolam        QUEtiapine   800 mg Oral QHS   sodium chloride  flush  3 mL Intravenous Q12H   spironolactone   25 mg Oral Daily   acetaminophen  **OR** acetaminophen , fentaNYL , midazolam , ondansetron  **OR** ondansetron  (ZOFRAN ) IV, oxyCODONE , senna-docusate  Assessment/ Plan:  Derek Garza is a 63 y.o.  male with hypertension, hyperlipidemia, diabetes mellitus type II, and BPH, who was admitted to [facility] on [facility]/20/[facility]24 for  Anasarca [R60.1] Scrotal swelling [N50.89] Anemia, unspecified type [D64.9] Leukopenia, unspecified type [D72.819]   Anasarca with nephrotic range proteinuria  Edema remains present. Continue IV Furosemide  40mg  TID. Appreciate US  for completing renal biopsy today. Expect preliminary results in 24-48 hours with final report in 5 days.   Hypertension, essential  Currently of all antihypertensives  Blood pressure stable   Diabetes mellitus type II with renal manifestations  Primary team to continue management   Hypoalbuminemia  Albumin  2.9, at last check. Will continue to periodically monitor.   5. Anemia, Macrocytic Will monitor need for ESA  Latest Reference Range & Units 05/24/24 05:13  Hemoglobin 13.0 - 17.0 g/dL 9.0 (L)  HCT 60.9 - 47.9 % 28.7 (L)  MCV 80.0 - 100.0 fL 113.9 (H)  MCH 26.0 - 34.0 pg 35.7 (H)  MCHC 30.0 - 36.0 g/dL 68.5  RDW 88.4 - 84.4 % 18.2 (H)    Differential includes acute glomerulopathy, myeloplastic disease, progressive diabetic nephropathy, hepatic cirrhosis or congestive heart failure.  - serologic work up negative.  - appreciate hematology input. Bone marrow aspiration completed - Renal biopsy completed on 12/31   LOS: 6 Derek Garza 12/31/20252:16 PM   "

## 2024-05-28 NOTE — Progress Notes (Signed)
 Arrived from IR , by bed, alert and oriented x 4, assist patient to restroom   05/28/24 1225  Vitals  Temp 97.8 F (36.6 C)  Temp Source Oral  BP 132/82  Pulse Rate 85  Level of Consciousness  Level of Consciousness Alert  MEWS COLOR  MEWS Score Color Green  Oxygen Therapy  SpO2 100 %  O2 Device Room Air  MEWS Score  MEWS Temp 0  MEWS Systolic 0  MEWS Pulse 0  MEWS RR 1  MEWS LOC 0  MEWS Score 1

## 2024-05-28 NOTE — Procedures (Signed)
 Interventional Radiology Procedure Note  Procedure: US  guided renal biopsy  Complications: None  Estimated Blood Loss: < 10 mL  Findings: Left renal biopsy with US  guidance.  Core samples sent to pathology for further processing.  Cordella DELENA Banner, MD

## 2024-05-28 NOTE — Progress Notes (Signed)
 Patient clinically stable post US  Renal biopsy per Dr Jenna , vitals stable post procedure. Received Versed  1 mg along with Fentanyl  50 mcg IV for procedure. Report given to Ruthanna Kitty RN post procedure/specials/12

## 2024-05-28 NOTE — Consult Note (Signed)
 "   Chief Complaint:  Nephrotic Range Proteinuria  Procedure: Renal Biopsy  Referring Provider(s): Faith Harris, NP  Supervising Physician: Jenna Hacker  Patient Status: [facility] - In-pt  History of Present Illness: Derek Garza is a 63 y.o. male with a history of bipolar disorder, neuromuscular disorder, BPH, HTN, HLD, and DM who presented to the ED on 12/23 with complaints of scrotal swelling, weight gain, and fatigue for the past month. He is known to IR from previous bone marrow biopsy with Dr. Johann on 12/26. Biopsy results were consistent with severe neutropenia and anemia possibly related to psychiatric medications, though this remains unclear. Nephrology started patient on lasix  which has reportedly improved his edema; however, he continues to have nephrotic range proteinuria of unclear origin. IR subsequently consulted again for renal biopsy for diagnostic purposes.   Patient sitting upright in bedside chair. Visibly irritated. States that he is currently having pain in his right shoulder and is unable to urinate. Denies any chest pain, shortness of breath, recent fevers/chills, nausea/vomiting, dysuria, hematuria, flank pain, or abdominal pain. NPO since midnight. Not on blood thinners. All questions and concerns answered at the bedside.    Patient is Full Code  Past Medical History:  Diagnosis Date   Bipolar disorder (HCC)    Cervical myelopathy (HCC)    Diabetes mellitus without complication (HCC)    Erectile dysfunction    Gait abnormality    Hyperlipidemia    Hypertension    Neuromuscular disorder (HCC)    Paresthesia    Peripheral neuropathy    Right inguinal hernia     Past Surgical History:  Procedure Laterality Date   ANKLE SURGERY  05/29/1977   APPENDECTOMY  05/29/2009   CERVICAL SPINE SURGERY     HERNIA REPAIR  [id number]   INGUINAL HERNIA REPAIR Right 03/16/2023   Procedure: HERNIA REPAIR INGUINAL ADULT, open;  Surgeon: Marinda Jayson KIDD, MD;   Location: [facility] ORS;  Service: General;  Laterality: Right;   INSERTION OF MESH  03/16/2023   Procedure: INSERTION OF MESH;  Surgeon: Marinda Jayson KIDD, MD;  Location: [facility] ORS;  Service: General;;   IR BONE MARROW BIOPSY & ASPIRATION  05/23/2024   SPINE SURGERY  10/2018   VASECTOMY      Allergies: Percocet [oxycodone -acetaminophen ]  Medications: Prior to Admission medications  Medication Sig Start Date End Date Taking? Authorizing Provider  atorvastatin  (LIPITOR) 10 MG tablet Take 1 tablet (10 mg total) by mouth daily. 07/05/23  Yes Cannady, Jolene T, NP  carbamazepine  (TEGRETOL  XR) 200 MG 12 hr tablet Take 1 tablet (200 mg total) by mouth 2 (two) times daily. Patient taking differently: Take 400 mg by mouth 2 (two) times daily. 06/13/23  Yes Cannady, Jolene T, NP  empagliflozin  (JARDIANCE ) 25 MG TABS tablet Take 1 tablet (25 mg total) by mouth daily. 07/05/23  Yes Cannady, Jolene T, NP  furosemide  (LASIX ) 80 MG tablet Take 1 tablet (80 mg total) by mouth daily. 07/05/23  Yes Cannady, Jolene T, NP  gabapentin  (NEURONTIN ) 600 MG tablet Take 1 tablet (600 mg total) by mouth 2 (two) times daily. 07/05/23  Yes Cannady, Jolene T, NP  potassium chloride  (KLOR-CON ) 10 MEQ tablet Take 1 tablet (10 mEq total) by mouth daily. 07/05/23  Yes Cannady, Jolene T, NP  QUEtiapine  (SEROQUEL ) 400 MG tablet Take 800 mg by mouth daily. 12/18/12  Yes [provider]  cyclobenzaprine (FLEXERIL) 10 MG tablet take 1 tablet by oral route 3 times a day as needed for mscle spasm  Patient not taking: Reported on 05/20/2024 01/24/21   [provider]  tamsulosin  (FLOMAX ) 0.4 MG CAPS capsule Take 1 capsule (0.4 mg total) by mouth daily after supper. Patient not taking: Reported on 05/20/2024 05/19/24   Cannady, Jolene T, NP  traMADol  (ULTRAM ) 50 MG tablet Take 50 mg by mouth every 12 (twelve) hours as needed for moderate pain (pain score 4-6). Patient not taking: Reported on 05/20/2024 03/16/23   [provider]     Family History  Problem Relation Age of Onset   Breast cancer Sister     Social History   Socioeconomic History   Marital status: Divorced    Spouse name: Not on file   Number of children: Not on file   Years of education: Not on file   Highest education level: Not on file  Occupational History   Occupation: on [employer]  Tobacco Use   Smoking status: Never   Smokeless tobacco: Never  Vaping Use   Vaping status: Never Used  Substance and Sexual Activity   Alcohol use: Yes    Comment: on occasion   Drug use: No   Sexual activity: Not Currently  Other Topics Concern   Not on file  Social History Narrative   Lives alone   Right Handed   Drinks 2 liter of soda daily   Social Drivers of Health   Tobacco Use: Low Risk (05/20/2024)   Patient History    Smoking Tobacco Use: Never    Smokeless Tobacco Use: Never    Passive Exposure: Not on file  Financial Resource Strain: Low Risk (07/05/2023)   Overall Financial Resource Strain (CARDIA)    Difficulty of Paying Living Expenses: Not hard at all  Food Insecurity: No Food Insecurity (05/20/2024)   Epic    Worried About Radiation Protection Practitioner of Food in the Last Year: Never true    Ran Out of Food in the Last Year: Never true  Transportation Needs: No Transportation Needs (05/20/2024)   Epic    Lack of Transportation (Medical): No    Lack of Transportation (Non-Medical): No  Physical Activity: Sufficiently Active (07/05/2023)   Exercise Vital Sign    Days of Exercise per Week: 5 days    Minutes of Exercise per Session: 30 min  Stress: No Stress Concern Present (07/05/2023)   [employer] of Occupational Health - Occupational Stress Questionnaire    Feeling of Stress : Only a little  Social Connections: Socially Isolated (07/05/2023)   Social Connection and Isolation Panel    Frequency of Communication with Friends and Family: Three times a week    Frequency of Social Gatherings with Friends and Family: Three times a  week    Attends Religious Services: Never    Active Member of Clubs or Organizations: No    Attends Banker Meetings: Never    Marital Status: Divorced  Depression (PHQ2-9): Medium Risk (05/19/2024)   Depression (PHQ2-9)    PHQ-2 Score: 9  Alcohol Screen: Low Risk (07/05/2023)   Alcohol Screen    Last Alcohol Screening Score (AUDIT): 0  Housing: Low Risk (05/20/2024)   Epic    Unable to Pay for Housing in the Last Year: No    Number of Times Moved in the Last Year: 0    Homeless in the Last Year: No  Utilities: Not At Risk (05/20/2024)   Epic    Threatened with loss of utilities: No  Health Literacy: Adequate Health Literacy (07/05/2023)   B1300 Health Literacy  Frequency of need for help with medical instructions: Never    Review of Systems  Genitourinary:  Positive for difficulty urinating. Negative for dysuria, flank pain and hematuria.  Musculoskeletal:        Right shoulder pain  Patient denies any headache, chest pain, shortness of breath, abdominal pain, N/V, or fever/chills. All other systems are negative.   Vital Signs: BP 139/75 (BP Location: Left Arm)   Pulse 98   Temp 98.5 F (36.9 C)   Resp 16   Ht 6' (1.829 m)   Wt 217 lb 2.5 oz (98.5 kg)   SpO2 99%   BMI 29.45 kg/m    Physical Exam Vitals reviewed.  Constitutional:      Appearance: Normal appearance.  HENT:     Mouth/Throat:     Mouth: Mucous membranes are moist.     Pharynx: Oropharynx is clear.  Cardiovascular:     Rate and Rhythm: Normal rate and regular rhythm.     Heart sounds: Normal heart sounds.  Pulmonary:     Effort: Pulmonary effort is normal.     Breath sounds: Normal breath sounds.  Abdominal:     General: Abdomen is flat.     Palpations: Abdomen is soft.     Tenderness: There is no abdominal tenderness. There is no right CVA tenderness or left CVA tenderness.  Skin:    General: Skin is warm and dry.  Neurological:     Mental Status: He is alert and oriented to  person, place, and time.  Psychiatric:        Behavior: Behavior normal.     Imaging: IR BONE MARROW BIOPSY & ASPIRATION Result Date: 05/23/2024 EXAM: Fluoroscopic guided bone marrow biopsy COMPARISON: None. CLINICAL HISTORY: Indication / Diagnosis: Anemia, unspecified type, Leukopenia, unspecified type, and Severe neutropenia. Additional clinical history: Anemia, unspecified type, Leukopenia, unspecified type, and Severe neutropenia. Previous biopsy of same target: No. Complications: No immediate complications. Conscious Sedation: 1 mg Versed  IV, 50 mcg fentanyl  IV. Sedation Technique: Under physician supervision, Versed  and fentanyl  were administered IV for moderate sedation. Pulse oximetry, heart rate, and blood pressure were continuously monitored with an independent trained observer present. Physician face-to-face sedation time: 10 minutes. Discussion: Time out performed including verification of patient, date of birth, procedure, and target as appropriate. Informed written consent was obtained. The site was prepared and draped using maximal sterile barrier technique including cutaneous antisepsis. The patient was positioned prone. Initial imaging was performed. Biopsy target: Organ or target location: Left iliac wing. Laterality: Left. Local anesthesia was administered. Under image guidance, the biopsy needle was advanced to the target and biopsy was performed. Biopsy details: Guidance modality: Fluoroscopy. Biopsy type: Core / Aspiration. Coaxial needle: Not specified. Core needle size: Not specified. Number of core specimens: Not specified. Aspiration performed: Not specified. On-site assessment of biopsy adequacy: Not specified. Preliminary assessment of sample adequacy: Not specified. The biopsy needle was removed and a sterile dressing was applied. Tract closure: None. Post-procedure imaging: Immediate post-biopsy imaging: Not performed. Estimated blood loss: Less than 10 mL. Specimens sent for  evaluation. IMPRESSION: 1. Technically successful fluoroscopy-guided bone marrow biopsy of the left iliac wing. 2. No immediate complications. Electronically signed by: Katheleen Faes MD 05/23/2024 11:27 AM EST RP Workstation: [id number]   MR PELVIS W WO CONTRAST Result Date: 05/22/2024 CLINICAL DATA:  Atrophic left testicle with intravesicular lesions seen on recent ultrasound. EXAM: MRI PELVIS WITHOUT AND WITH CONTRAST TECHNIQUE: Multiplanar multisequence MR imaging of the pelvis was performed both before  and after administration of intravenous contrast. CONTRAST:  10mL GADAVIST  GADOBUTROL  1 MMOL/ML IV SOLN COMPARISON:  Ultrasound exam 05/20/2024. FINDINGS: Urinary Tract:  No abnormality visualized. Bowel:  Unremarkable visualized pelvic bowel loops. Vascular/Lymphatic: No pathologically enlarged lymph nodes. No significant vascular abnormality seen. Reproductive: Prostate gland is heterogeneous but incompletely evaluated on this study. Right testicle measures 3.6 x 4.7 x 3.4 cm and is unremarkable in appearance. Right epididymal tissues are unremarkable. Moderate to large right hydrocele evident. Left testicle measures 2.7 x 2.8 x 1.8 cm and is heterogeneous in signal intensity. Circumferential capsular thickening evident. Postcontrast imaging shows irregular capsular enhancement with no overtly suspicious enhancement characteristics. No discrete intratesticular mass lesion evident. No hydrocele. Other:  Right groin hernia contains only fat. Musculoskeletal: No suspicious marrow signal abnormality. Diffuse body wall edema evident in the lower pelvis and upper thighs. IMPRESSION: 1. Left testicle is small and heterogeneous in signal intensity with circumferential capsular thickening and irregular capsular enhancement. No discrete suspicious intratesticular mass lesion evident by MRI. Imaging features are nonspecific but could be related to prior trauma or infection. Neoplasm considered less likely. Given the  finding of concern is evident on sonography, follow-up scrotal ultrasound in 3 months recommended to ensure stability. 2. Moderate to large right hydrocele. 3. Right groin hernia contains only fat. 4. Diffuse body wall edema in the lower pelvis and upper thighs. Electronically Signed   By: Camellia Candle M.D.   On: 05/22/2024 07:02   ECHOCARDIOGRAM COMPLETE Result Date: 05/21/2024    ECHOCARDIOGRAM REPORT   Patient Name:   Derek Garza Date of Exam: 05/21/2024 Medical Rec #:  [id number]      Height:       72.0 in Accession #:    [id number]     Weight:       219.8 lb Date of Birth:  1960/07/21      BSA:          2.218 m Patient Age:    63 years       BP:           131/72 mmHg Patient Gender: M              HR:           106 bpm. Exam Location:  [facility] Procedure: 2D Echo, Cardiac Doppler, Color Doppler and Strain Analysis (Both            Spectral and Color Flow Doppler were utilized during procedure). Indications:     Anasarca  History:         Patient has no prior history of Echocardiogram examinations.                  Risk Factors:Diabetes, Hypertension and Dyslipidemia.  Sonographer:     Christopher Furnace Referring Phys:  [id number] [facility] Diagnosing Phys: Caron Poser  Sonographer Comments: Global longitudinal strain was attempted. IMPRESSIONS  1. Very technically difficult study.  2. Left ventricular ejection fraction, by estimation, is 55 to 60%. The left ventricle has normal function. Left ventricular endocardial border not optimally defined to evaluate regional wall motion. Left ventricular diastolic parameters were normal.  3. Right ventricular systolic function is normal. The right ventricular size is normal.  4. The mitral valve is normal in structure. Trivial mitral valve regurgitation. No evidence of mitral stenosis.  5. The aortic valve is tricuspid. Aortic valve regurgitation is not visualized. No aortic stenosis is present.  6. The inferior vena cava is normal in size with  greater than 50% respiratory  variability, suggesting right atrial pressure of 3 mmHg. Comparison(s): No prior Echocardiogram. Conclusion(s)/Recommendation(s): Normal biventricular function without evidence of hemodynamically significant valvular heart disease. FINDINGS  Left Ventricle: Left ventricular ejection fraction, by estimation, is 55 to 60%. The left ventricle has normal function. Left ventricular endocardial border not optimally defined to evaluate regional wall motion. Global longitudinal strain performed but  not reported based on interpreter judgement due to suboptimal tracking. The left ventricular internal cavity size was normal in size. There is no left ventricular hypertrophy. Left ventricular diastolic parameters were normal. Right Ventricle: The right ventricular size is normal. Right vetricular wall thickness was not well visualized. Right ventricular systolic function is normal. Left Atrium: Left atrial size was normal in size. Right Atrium: Right atrial size was normal in size. Pericardium: Trivial pericardial effusion is present. Mitral Valve: The mitral valve is normal in structure. Trivial mitral valve regurgitation. No evidence of mitral valve stenosis. Tricuspid Valve: The tricuspid valve is normal in structure. Tricuspid valve regurgitation is trivial. No evidence of tricuspid stenosis. Aortic Valve: The aortic valve is tricuspid. Aortic valve regurgitation is not visualized. No aortic stenosis is present. Aortic valve mean gradient measures 3.0 mmHg. Aortic valve peak gradient measures 4.8 mmHg. Aortic valve area, by VTI measures 4.28 cm. Pulmonic Valve: The pulmonic valve was not well visualized. Pulmonic valve regurgitation is trivial. No evidence of pulmonic stenosis. Aorta: The aortic root is normal in size and structure. Venous: The inferior vena cava is normal in size with greater than 50% respiratory variability, suggesting right atrial pressure of 3 mmHg. IAS/Shunts: The interatrial septum was not well  visualized.  LEFT VENTRICLE PLAX 2D LVIDd:         4.90 cm   Diastology LVIDs:         3.10 cm   LV e' medial:    7.51 cm/s LV PW:         1.20 cm   LV E/e' medial:  14.6 LV IVS:        0.70 cm   LV e' lateral:   10.80 cm/s LVOT diam:     2.30 cm   LV E/e' lateral: 10.2 LV SV:         90 LV SV Index:   41 LVOT Area:     4.15 cm LV IVRT:       87 msec  RIGHT VENTRICLE RV Basal diam:  3.00 cm     PULMONARY VEINS RV Mid diam:    2.90 cm     Diastolic Velocity: 49.30 cm/s RV S prime:     13.60 cm/s  S/D Velocity:       1.50 TAPSE (M-mode): 2.3 cm      Systolic Velocity:  73.40 cm/s LEFT ATRIUM             Index        RIGHT ATRIUM           Index LA diam:        4.20 cm 1.89 cm/m   RA Area:     14.80 cm LA Vol (A2C):   50.3 ml 22.68 ml/m  RA Volume:   36.30 ml  16.37 ml/m LA Vol (A4C):   50.1 ml 22.59 ml/m LA Biplane Vol: 52.7 ml 23.76 ml/m  AORTIC VALVE AV Area (Vmax):    3.47 cm AV Area (Vmean):   3.56 cm AV Area (VTI):     4.28 cm AV Vmax:  110.00 cm/s AV Vmean:          78.050 cm/s AV VTI:            0.211 m AV Peak Grad:      4.8 mmHg AV Mean Grad:      3.0 mmHg LVOT Vmax:         91.80 cm/s LVOT Vmean:        66.800 cm/s LVOT VTI:          0.217 m LVOT/AV VTI ratio: 1.03  AORTA Ao Root diam: 3.40 cm MITRAL VALVE                TRICUSPID VALVE MV Area (PHT): 6.07 cm     TR Peak grad:   12.4 mmHg MV Decel Time: 125 msec     TR Vmax:        176.00 cm/s MV E velocity: 110.00 cm/s MV A velocity: 95.40 cm/s   SHUNTS MV E/A ratio:  1.15         Systemic VTI:  0.22 m                             Systemic Diam: 2.30 cm Caron Poser Electronically signed by Caron Poser Signature Date/Time: 05/21/2024/3:16:02 PM    Final    CT CHEST ABDOMEN PELVIS W CONTRAST Result Date: 05/20/2024 CLINICAL DATA:  Metastatic disease evaluation. EXAM: CT CHEST, ABDOMEN, AND PELVIS WITH CONTRAST TECHNIQUE: Multidetector CT imaging of the chest, abdomen and pelvis was performed following the standard protocol during  bolus administration of intravenous contrast. RADIATION DOSE REDUCTION: This exam was performed according to the departmental dose-optimization program which includes automated exposure control, adjustment of the mA and/or kV according to patient size and/or use of iterative reconstruction technique. CONTRAST:  OMNIPAQUE  IOHEXOL  300 MG/ML  SOLN COMPARISON:  CT abdomen pelvis dated 03/17/2023. FINDINGS: CT CHEST FINDINGS Cardiovascular: There is no cardiomegaly. Small pericardial effusion. Three vessel coronary vascular calcification. Mild atherosclerotic calcification of the thoracic aorta. No aneurysmal dilatation or dissection. The origins of the great vessels of the aortic arch and the central pulmonary arteries are patent. Mediastinum/Nodes: No hilar or mediastinal adenopathy. The esophagus is grossly unremarkable no mediastinal fluid collection. Lungs/Pleura: Small left and trace right pleural effusions. There is partial compressive atelectasis of the left lower lobe. Pneumonia is not excluded. No pneumothorax. The central airways are patent. Musculoskeletal: Degenerative changes of the spine. No acute osseous pathology. 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 CT ABDOMEN PELVIS FINDINGS No intra-abdominal free air.  Small ascites. Hepatobiliary: The liver is unremarkable. No biliary dilatation. Cholecystectomy. Pancreas: Moderately atrophic pancreas. No active inflammatory changes. Spleen: Normal in size without focal abnormality. Adrenals/Urinary Tract: The adrenal glands unremarkable. Punctate nonobstructing left renal pole calculus. There is no hydronephrosis on either side. There is symmetric enhancement and excretion of contrast by both kidneys. 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 There is diffuse colonic thickening which may be related to underdistention but concerning for colitis. Clinical correlation  recommended. There is no bowel obstruction. Appendectomy. Vascular/Lymphatic: Moderate aortoiliac atherosclerotic disease. The IVC is unremarkable. No portal venous gas. There is no adenopathy. Reproductive: The prostate gland is grossly unremarkable. Other: Small fat containing bilateral inguinal hernias. Partially visualized bilateral hydroceles, right greater than left. The left testicle appears atrophic. See report for testicular ultrasound. Musculoskeletal: Osteopenia with degenerative changes of the  spine. No acute osseous pathology. There is diffuse subcutaneous edema and anasarca. No fluid collection. IMPRESSION: 1. No evidence of metastatic disease in the chest, abdomen, or pelvis. 2. Small left and trace right pleural effusions with partial compressive atelectasis of the left lower lobe. Pneumonia is not excluded. 3. Small ascites and anasarca. 4. Possible colitis.  No bowel obstruction. 5. Punctate nonobstructing left renal calculus. No hydronephrosis. 6.  Aortic Atherosclerosis (ICD10-I70.0). Electronically Signed   By: Vanetta Chou M.D.   On: 05/20/2024 21:05   US  SCROTUM W/DOPPLER Result Date: 05/20/2024 EXAM: ULTRASOUND SCROTUM/TESTICLES WITH DOPPLER FLOW EVALUATION 05/20/2024 02:43:54 PM TECHNIQUE: Duplex ultrasound using B-mode/gray scaled imaging, Doppler spectral analysis and color flow Doppler was obtained of the testicles. COMPARISON: US  Scrotum 03/17/2023. CLINICAL HISTORY: Swelling of right testicle. FINDINGS: RIGHT: GREY SCALE: The right testicle measures 4.8 x 3.5 x 3.4 cm. It demonstrates normal homogeneous echotexture without focal lesion. No testicular microlithiasis. DOPPLER EVALUATION: There is normal arterial and venous Doppler flow within the testicle. VARICOCELE: No scrotal varicocele. SCROTAL SAC: Interval development of a large right hydrocele, containing echogenic debris. EPIDIDYMIS: No acute abnormality. LEFT: GREY SCALE: The left testicle measures 3.3 x 1.7 x 1.9 cm.  There is chronic heterogeneity of the atrophic left testicle. On today's exam, there is a more focal hypoechogenic lesion containing punctate calcification, measuring 2 x 1.2 x 1.8 cm. No testicular microlithiasis. DOPPLER EVALUATION: There is normal arterial and venous Doppler flow within the testicle. VARICOCELE: No scrotal varicocele. SCROTAL SAC: No hydrocele. EPIDIDYMIS: No acute abnormality. INGUINAL REGIONS: Fat containing bilateral inguinal hernias, larger on the right than the left. IMPRESSION: 1. Interval development of a large right hydrocele containing echogenic debris. 2. Atrophic left testicle with a more focal hypoechogenic lesion on today's exam containing punctate calcification, measuring 2 x 1.2 x 1.8 cm. While this could represent more focal atrophy in the surrounding testicular parenchyma, a nonemergent pelvic MRI with iv contrast, should be considered to exclude a developing neoplasm. Laboratory correlation also recommended. . Electronically signed by: Rogelia Myers MD 05/20/2024 04:09 PM EST RP Workstation: [id number]    Labs:  CBC: Recent Labs    05/23/24 1117 05/24/24 0513 05/26/24 0619 05/28/24 0521  WBC 7.4 5.1 4.8 4.4  HGB 9.7* 9.0* 8.0* 9.0*  HCT 30.2* 28.7* 24.7* 28.7*  PLT 374 325 309 343    COAGS: Recent Labs    05/21/24 1047 05/28/24 0521  INR 1.0 0.9    BMP: Recent Labs    05/22/24 0457 05/24/24 0513 05/26/24 0619 05/28/24 0521  NA 134* 133* 132* 132*  K 4.1 3.6 4.0 4.1  CL 99 98 96* 94*  CO2 25 28 27 29   GLUCOSE 77 177* 85 126*  BUN 11 13 12 15   CALCIUM  7.7* 7.7* 8.2* 8.0*  CREATININE 0.69 0.78 0.72 0.82  GFRNONAA >60 >60 >60 >60    LIVER FUNCTION TESTS: Recent Labs    07/05/23 0820 02/06/24 1039 05/19/24 1150 05/20/24 0913 05/24/24 0859 05/26/24 0619  BILITOT 0.3 0.3 0.2 0.2  --   --   AST 19 31 15 19   --   --   ALT 25 28 11 11   --   --   ALKPHOS 122* 129* 162* 168*  --   --   PROT 7.2 6.0 4.4* 5.0*  --   --   ALBUMIN  4.5  3.8* 2.4* 2.4* 2.3* 2.9*    TUMOR MARKERS: No results for input(s): AFPTM, CEA, CA199, CHROMGRNA in the last 8760 hours.  Assessment and Plan:  Nephrotic Range Proteinuria: Derek Garza is a 64 y.o. male with a history of bipolar disorder on Tegretol  and DM who presented to the ED with at least a month of weight gain, fatigue, and scrotal swelling. Workup significant for large hydrocele, severe neutropenia and anemia potentially related to medications, and nephrotic range proteinuria of unclear etiology. IR consulted for renal biopsy and patient to undergo image guided renal biopsy in [place]  today with Dr. KANDICE Banner. Procedure to be performed under moderate sedation.  -NPO since midnight -Not on AC; INR 0.9 -BUN/Cr 15/0.82 today; Protein Cr ration 6.2 on 12/23 -Plan for renal biopsy in [place]  with Dr. KANDICE Banner today  Risks and benefits of renal biopsy was discussed with the patient and/or patient's family including, but not limited to bleeding, infection, damage to adjacent structures or low yield requiring additional tests.  All of the questions were answered and there is agreement to proceed.  Consent signed and in US .   Thank you for allowing our service to participate in Derek Garza 's care.    Electronically Signed: Kelcee Bjorn M Virgia Kelner, PA-C   05/28/2024, 10:01 AM     I spent a total of 40 Minutes in face to face in clinical consultation, greater than 50% of which was counseling/coordinating care for renal biopsy. "

## 2024-05-29 DIAGNOSIS — R601 Generalized edema: Secondary | ICD-10-CM | POA: Diagnosis not present

## 2024-05-29 LAB — GLUCOSE, CAPILLARY
Glucose-Capillary: 96 mg/dL (ref 70–99)
Glucose-Capillary: 116 mg/dL — ABNORMAL HIGH (ref 70–99)
Glucose-Capillary: 109 mg/dL — ABNORMAL HIGH (ref 70–99)
Glucose-Capillary: 143 mg/dL — ABNORMAL HIGH (ref 70–99)

## 2024-05-29 LAB — PROTEIN / CREATININE RATIO, URINE
Creatinine, Urine: 75 mg/dL
Total Protein, Urine: >600 mg/dL

## 2024-05-29 MED ORDER — METOLAZONE 2.5 MG PO TABS
2.5000 mg | ORAL_TABLET | Freq: Once | ORAL | Status: AC
  Administered 2024-05-29: 2.5 mg via ORAL
  Filled 2024-05-29: qty 1

## 2024-05-29 NOTE — Plan of Care (Signed)
  Problem: Education: Goal: Knowledge of General Education information will improve Description: Including pain rating scale, medication(s)/side effects and non-pharmacologic comfort measures Outcome: Progressing   Problem: Health Behavior/Discharge Planning: Goal: Ability to manage health-related needs will improve Outcome: Progressing   Problem: Clinical Measurements: Goal: Diagnostic test results will improve Outcome: Progressing   Problem: Activity: Goal: Risk for activity intolerance will decrease Outcome: Progressing   Problem: Coping: Goal: Level of anxiety will decrease Outcome: Progressing   Problem: Pain Managment: Goal: General experience of comfort will improve and/or be controlled Outcome: Progressing   Problem: Safety: Goal: Ability to remain free from injury will improve Outcome: Progressing

## 2024-05-29 NOTE — Progress Notes (Addendum)
 "    Progress Note    Derek Garza  FMW:[id number] DOB: 12-12-1960  DOA: 05/20/2024 PCP: Valerio Melanie [name], NP      Brief Narrative:    Medical records reviewed and are as summarized below:  Derek Garza is a 64 y.o. male with medical history of HTN, HLD, DMII, BPH, who presented to the hospital with fatigue, general body swelling including abdomen, lower extremities and scrotum.  He also reported progressive weight gain, decreased urination and general weakness.     Assessment/Plan:   Principal Problem:   Anasarca Active Problems:   DM type 2 with diabetic peripheral neuropathy (HCC)   Hypertension associated with diabetes (HCC)   Hypogonadism in male   Bipolar 2 disorder (HCC)   Hyperlipidemia associated with type 2 diabetes mellitus (HCC)   Benign prostatic hyperplasia (BPH) with straining on urination     Body mass index is 28.64 kg/m.    Anasarca with nephrotic range proteinuria: S/p left kidney biopsy on 05/28/2024.  Follow-up pathology report. Case discussed with Dr. Dennise, nephrologist, at the bedside.  He recommended continuing IV Lasix  through the weekend.    Scrotal swelling, right hydrocele: Diffuse scrotal swelling may be related to hypoalbuminemia.  Outpatient follow-up recommended for evaluation of right hydrocele.  Leukopenia, macrocytic anemia: Leukopenia has resolved. S/p left iliac bone marrow biopsy on 05/23/2024.  No evidence of acute leukemia.  Immature granulocytes/promyelocytes, immature erythrocyte series noted suggestive of regenerative bone marrow from suspected bone marrow insult per hematologist.  Differential diagnoses include toxicity from Tegretol /Seroquel   enhanced toxicity from hypoalbuminemia. Of note, patient does not want to discontinue psychotropics. Appreciate input from hematologist.   Bipolar disorder: Patient is on Tegretol  and Seroquel .  He does not want to discontinue these medicines.  He prefers to follow-up with his  oupatient psychiatrist. Appreciate input from psychiatrist.   Comorbidities include type II DM, hypertension, hyperlipidemia, BPH, peripheral neuropathy   Plan of care was discussed with Dorothyann, sister, over the phone    Diet Order             Diet heart healthy/carb modified Room service appropriate? Yes; Fluid consistency: Thin  Diet effective now                                  Consultants: Nephrologist Psychiatrist Oncologist Interventional radiologist  Procedures: Left renal biopsy on 05/28/2024 Left iliac bone marrow biopsy on 05/23/2024    Medications:    atorvastatin   10 mg Oral Daily   carbamazepine   400 mg Oral BID   furosemide   40 mg Intravenous TID   gabapentin   600 mg Oral BID   insulin  aspart  0-15 Units Subcutaneous TID WC   insulin  aspart  0-5 Units Subcutaneous QHS   QUEtiapine   800 mg Oral QHS   sodium chloride  flush  3 mL Intravenous Q12H   spironolactone   25 mg Oral Daily   Continuous Infusions:   Anti-infectives (From admission, onward)    None              Family Communication/Anticipated D/C date and plan/Code Status   DVT prophylaxis: SCDs Start: 05/20/24 1752     Code Status: Full Code  Family Communication: Plan discussed with Dorothyann, sister, over the phone Disposition Plan: Plan to discharge home   Status is: Inpatient Remains inpatient appropriate because: Anasarca, nephrotic range proteinuria       Subjective:   Interval events noted.  He still has generalized body swelling but he thinks his swelling is slowly coming down.  Dr. Dennise, nephrologist, and Faith, NP, with nephrology team at the bedside.  Objective:    Vitals:   05/28/24 1532 05/28/24 2015 05/29/24 0334 05/29/24 0444  BP: 125/67 131/60 138/73   Pulse: 85 89 96   Resp: 17 18 18    Temp: 98.2 F (36.8 C) 98.5 F (36.9 C) 97.6 F (36.4 C)   TempSrc:      SpO2: 97% 98% 97%   Weight:    95.8 kg  Height:        No data found.   Intake/Output Summary (Last 24 hours) at 05/29/2024 1351 Last data filed at 05/29/2024 1034 Gross per 24 hour  Intake 963 ml  Output 1900 ml  Net -937 ml   Filed Weights   05/27/24 1700 05/28/24 0237 05/29/24 0444  Weight: 98.1 kg 98.5 kg 95.8 kg    Exam:  GEN: NAD SKIN: Warm and dry EYES: No pallor or icterus ENT: MMM CV: RRR PULM: CTA B ABD: soft, distended, NT, +BS CNS: AAO x 3, non focal EXT: Bilateral lower extremity edema from the thighs to the feet      Data Reviewed:   I have personally reviewed following labs and imaging studies:  Labs: Labs show the following:   Basic Metabolic Panel: Recent Labs  Lab 05/24/24 0513 05/26/24 0619 05/28/24 0521  NA 133* 132* 132*  K 3.6 4.0 4.1  CL 98 96* 94*  CO2 28 27 29   GLUCOSE 177* 85 126*  BUN 13 12 15   CREATININE 0.78 0.72 0.82  CALCIUM  7.7* 8.2* 8.0*   GFR Estimated Creatinine Clearance: 109.3 mL/min (by C-G formula based on SCr of 0.82 mg/dL). Liver Function Tests: Recent Labs  Lab 05/24/24 0859 05/26/24 0619  ALBUMIN  2.3* 2.9*   No results for input(s): LIPASE, AMYLASE in the last 168 hours. No results for input(s): AMMONIA in the last 168 hours. Coagulation profile Recent Labs  Lab 05/28/24 0521  INR 0.9    CBC: Recent Labs  Lab 05/23/24 0546 05/23/24 1117 05/24/24 0513 05/26/24 0619 05/28/24 0521  WBC 7.3 7.4 5.1 4.8 4.4  NEUTROABS 4.8 4.4 2.5  --   --   HGB 8.8* 9.7* 9.0* 8.0* 9.0*  HCT 27.7* 30.2* 28.7* 24.7* 28.7*  MCV 112.1* 110.6* 113.9* 109.3* 112.5*  PLT 330 374 325 309 343   Cardiac Enzymes: No results for input(s): CKTOTAL, CKMB, CKMBINDEX, TROPONINI in the last 168 hours. BNP (last 3 results) Recent Labs    05/20/24 1816  PROBNP 515.0*   CBG: Recent Labs  Lab 05/28/24 1238 05/28/24 1658 05/28/24 2018 05/29/24 0802 05/29/24 1213  GLUCAP 112* 128* 138* 96 116*   D-Dimer: No results for input(s): DDIMER in the last 72  hours. Hgb A1c: No results for input(s): HGBA1C in the last 72 hours. Lipid Profile: No results for input(s): CHOL, HDL, LDLCALC, TRIG, CHOLHDL, LDLDIRECT in the last 72 hours. Thyroid function studies: No results for input(s): TSH, T4TOTAL, T3FREE, THYROIDAB in the last 72 hours.  Invalid input(s): FREET3 Anemia work up: No results for input(s): VITAMINB12, FOLATE, FERRITIN, TIBC, IRON, RETICCTPCT in the last 72 hours. Sepsis Labs: Recent Labs  Lab 05/23/24 1117 05/24/24 0513 05/26/24 0619 05/28/24 0521  WBC 7.4 5.1 4.8 4.4    Microbiology No results found for this or any previous visit (from the past 240 hours).   Procedures and diagnostic studies:  US  BIOPSY (KIDNEY) Result Date:  05/28/2024 INDICATION: Nephrotic range proteinuria EXAM: Ultrasound-guided biopsy MEDICATIONS: None. ANESTHESIA/SEDATION: Moderate (conscious) sedation was employed during this procedure. A total of Versed  1 mg and Fentanyl  50 mcg was administered intravenously by the radiology nurse. Total intra-service moderate Sedation Time: 16 minutes. The patient's level of consciousness and vital signs were monitored continuously by radiology nursing throughout the procedure under my direct supervision. COMPLICATIONS: None immediate. PROCEDURE: Informed written consent was obtained from the patient after a thorough discussion of the procedural risks, benefits and alternatives. All questions were addressed. Maximal Sterile Barrier Technique was utilized including caps, mask, sterile gowns, sterile gloves, sterile drape, hand hygiene and skin antiseptic. A timeout was performed prior to the initiation of the procedure. In a prone position both flank regions were evaluated for percutaneous access. The left kidney was better visualized and chosen for the target of the renal biopsy. The left flank region was then prepped and draped in usual sterile fashion. Local anesthesia was achieved  with 1% lidocaine . Small incision was made in the left flank region. A 17 gauge access needle was then advanced through the incision under ultrasound guidance to the lower pole the left kidney. The needle was advanced into the renal parenchyma. The stylet was removed. An 18 gauge BioPince needle was then advanced under ultrasound guidance through the introducer needle and 2 core needle biopsies were obtained in sequential fashion. A small amount of Gel-Foam was then injected through the access needle and the needle was removed. Final image of the left kidney demonstrates no acute hemorrhage. Sample sent to laboratory for further evaluation and processing. IMPRESSION: Satisfactory core needle biopsy of the lower pole of the left kidney. Electronically Signed   By: Cordella Banner   On: 05/28/2024 14:40               LOS: 7 days   Jamyla Ard  [facility]   Pager on [email or web address]. If 7PM-7AM, please contact night-coverage at [email or web address]     05/29/2024, 1:51 PM           "

## 2024-05-29 NOTE — Progress Notes (Signed)
 " [facility]  ROUNDING NOTE   Subjective:   Patient seen sitting up in chair Complains of generalized pain Continues to urinate, recorded  Daily weight 95.8kg (98.5)  Objective:  Vital signs in last 24 hours:  Temp:  [97.6 F (36.4 C)-98.5 F (36.9 C)] 97.6 F (36.4 C) (01/01 0334) Pulse Rate:  [74-96] 96 (01/01 0334) Resp:  [9-18] 18 (01/01 0334) BP: (96-138)/(48-82) 138/73 (01/01 0334) SpO2:  [96 %-100 %] 97 % (01/01 0334) Weight:  [95.8 kg] 95.8 kg (01/01 0444)  Weight change: -2.3 kg Filed Weights   05/27/24 1700 05/28/24 0237 05/29/24 0444  Weight: 98.1 kg 98.5 kg 95.8 kg    Intake/Output: I/O last 3 completed shifts: In: 483 [P.O.:480; I.V.:3] Out: 3845 [Urine:3845]   Intake/Output this shift:  Total I/O In: 480 [P.O.:480] Out: 500 [Urine:500]  Physical Exam: General: NAD  Head: Normocephalic  Eyes: Anicteric  Lungs:  Clear, on room air  Heart: Regular rate  Abdomen:  Soft, nontender,   Extremities:  ++ peripheral edema  Neurologic: Alert, conversant  Skin: No lesions  GU scrotal edema  Access: none    Basic Metabolic Panel: Recent Labs  Lab 05/24/24 0513 05/26/24 0619 05/28/24 0521  NA 133* 132* 132*  K 3.6 4.0 4.1  CL 98 96* 94*  CO2 28 27 29   GLUCOSE 177* 85 126*  BUN 13 12 15   CREATININE 0.78 0.72 0.82  CALCIUM  7.7* 8.2* 8.0*    Liver Function Tests: Recent Labs  Lab 05/24/24 0859 05/26/24 0619  ALBUMIN  2.3* 2.9*   No results for input(s): LIPASE, AMYLASE in the last 168 hours. No results for input(s): AMMONIA in the last 168 hours.  CBC: Recent Labs  Lab 05/23/24 0546 05/23/24 1117 05/24/24 0513 05/26/24 0619 05/28/24 0521  WBC 7.3 7.4 5.1 4.8 4.4  NEUTROABS 4.8 4.4 2.5  --   --   HGB 8.8* 9.7* 9.0* 8.0* 9.0*  HCT 27.7* 30.2* 28.7* 24.7* 28.7*  MCV 112.1* 110.6* 113.9* 109.3* 112.5*  PLT 330 374 325 309 343    Cardiac Enzymes: No results for input(s): CKTOTAL, CKMB, CKMBINDEX,  TROPONINI in the last 168 hours.  BNP: Invalid input(s): POCBNP  CBG: Recent Labs  Lab 05/28/24 0803 05/28/24 1238 05/28/24 1658 05/28/24 2018 05/29/24 0802  GLUCAP 107* 112* 128* 138* 96    Microbiology: Results for orders placed or performed in visit on 05/19/24  Microscopic Examination     Status: None   Collection Time: 05/19/24 11:35 AM   Urine  Result Value Ref Range Status   WBC, UA None seen 0 - 5 /hpf Final   RBC, Urine 0-2 0 - 2 /hpf Final   Epithelial Cells (non renal) None seen 0 - 10 /hpf Final   Bacteria, UA None seen None seen/Few Final  Urine Culture     Status: None   Collection Time: 05/19/24  1:19 PM   Specimen: Urine   UR  Result Value Ref Range Status   Urine Culture, Routine Final report  Final   Organism ID, Bacteria Comment  Final    Comment: Mixed urogenital flora Less than 10,000 colonies/mL     Coagulation Studies: Recent Labs    05/28/24 0521  LABPROT 13.0  INR 0.9     Urinalysis: No results for input(s): COLORURINE, LABSPEC, PHURINE, GLUCOSEU, HGBUR, BILIRUBINUR, KETONESUR, PROTEINUR, UROBILINOGEN, NITRITE, LEUKOCYTESUR in the last 72 hours.  Invalid input(s): APPERANCEUR     Imaging: US  BIOPSY (KIDNEY) Result Date: 05/28/2024 INDICATION:  Nephrotic range proteinuria EXAM: Ultrasound-guided biopsy MEDICATIONS: None. ANESTHESIA/SEDATION: Moderate (conscious) sedation was employed during this procedure. A total of Versed  1 mg and Fentanyl  50 mcg was administered intravenously by the radiology nurse. Total intra-service moderate Sedation Time: 16 minutes. The patient's level of consciousness and vital signs were monitored continuously by radiology nursing throughout the procedure under my direct supervision. COMPLICATIONS: None immediate. PROCEDURE: Informed written consent was obtained from the patient after a thorough discussion of the procedural risks, benefits and alternatives. All questions were  addressed. Maximal Sterile Barrier Technique was utilized including caps, mask, sterile gowns, sterile gloves, sterile drape, hand hygiene and skin antiseptic. A timeout was performed prior to the initiation of the procedure. In a prone position both flank regions were evaluated for percutaneous access. The left kidney was better visualized and chosen for the target of the renal biopsy. The left flank region was then prepped and draped in usual sterile fashion. Local anesthesia was achieved with 1% lidocaine . Small incision was made in the left flank region. A 17 gauge access needle was then advanced through the incision under ultrasound guidance to the lower pole the left kidney. The needle was advanced into the renal parenchyma. The stylet was removed. An 18 gauge BioPince needle was then advanced under ultrasound guidance through the introducer needle and 2 core needle biopsies were obtained in sequential fashion. A small amount of Gel-Foam was then injected through the access needle and the needle was removed. Final image of the left kidney demonstrates no acute hemorrhage. Sample sent to laboratory for further evaluation and processing. IMPRESSION: Satisfactory core needle biopsy of the lower pole of the left kidney. Electronically Signed   By: Cordella Banner   On: 05/28/2024 14:40      Medications:      atorvastatin   10 mg Oral Daily   carbamazepine   400 mg Oral BID   furosemide   40 mg Intravenous TID   gabapentin   600 mg Oral BID   insulin  aspart  0-15 Units Subcutaneous TID WC   insulin  aspart  0-5 Units Subcutaneous QHS   metolazone  2.5 mg Oral Once   QUEtiapine   800 mg Oral QHS   sodium chloride  flush  3 mL Intravenous Q12H   spironolactone   25 mg Oral Daily   acetaminophen  **OR** acetaminophen , ondansetron  **OR** ondansetron  (ZOFRAN ) IV, oxyCODONE , senna-docusate  Assessment/ Plan:  Derek Garza is a 64 y.o.  male with hypertension, hyperlipidemia, diabetes mellitus type  II, and BPH, who was admitted to [facility] on [facility]/20/[facility]24 for Anasarca [R60.1] Scrotal swelling [N50.89] Anemia, unspecified type [D64.9] Leukopenia, unspecified type [D72.819]   Anasarca with nephrotic range proteinuria  Edema remains present but patient feels it's improving in some places. Continue IV Furosemide  40mg  TID and spironolactone . Will order Metolazone 2.5mg  today. Renal biopsy completed on 12/31. Expect preliminary results in 24-48 hours with final report in 5 days.   Hypertension, essential  Currently off all antihypertensives    Diabetes mellitus type II with renal manifestations  Primary team to continue management   Hypoalbuminemia  Albumin  2.9, at last check. Level ordered for am. If remains decreased, will restart IV albumin    5. Anemia, Macrocytic Will monitor need for ESA  Latest Reference Range & Units 05/24/24 05:13  Hemoglobin 13.0 - 17.0 g/dL 9.0 (L)  HCT 60.9 - 47.9 % 28.7 (L)  MCV 80.0 - 100.0 fL 113.9 (H)  MCH 26.0 - 34.0 pg 35.7 (H)  MCHC 30.0 - 36.0 g/dL 68.5  RDW 88.4 -  15.5 % 18.2 (H)    Differential includes acute glomerulopathy, myeloplastic disease, progressive diabetic nephropathy, hepatic cirrhosis or congestive heart failure.  - serologic work up negative.  - appreciate hematology input. Bone marrow aspiration completed - Renal biopsy completed on 12/31   LOS: 7 Derek Garza 1/1/202611:29 AM   "

## 2024-05-30 ENCOUNTER — Encounter (HOSPITAL_COMMUNITY): Payer: Self-pay

## 2024-05-30 DIAGNOSIS — R601 Generalized edema: Secondary | ICD-10-CM | POA: Diagnosis not present

## 2024-05-30 LAB — RENAL FUNCTION PANEL
Albumin: 2.8 g/dL — ABNORMAL LOW (ref 3.5–5.0)
Anion gap: 9 (ref 5–15)
BUN: 20 mg/dL (ref 8–23)
CO2: 29 mmol/L (ref 22–32)
Calcium: 8.5 mg/dL — ABNORMAL LOW (ref 8.9–10.3)
Chloride: 91 mmol/L — ABNORMAL LOW (ref 98–111)
Creatinine, Ser: 1.08 mg/dL (ref 0.61–1.24)
GFR, Estimated: >60 mL/min
Glucose, Bld: 119 mg/dL — ABNORMAL HIGH (ref 70–99)
Phosphorus: 4.8 mg/dL — ABNORMAL HIGH (ref 2.5–4.6)
Potassium: 4.6 mmol/L (ref 3.5–5.1)
Sodium: 129 mmol/L — ABNORMAL LOW (ref 135–145)

## 2024-05-30 LAB — CBC
HCT: 28.2 % — ABNORMAL LOW (ref 39.0–52.0)
Hemoglobin: 8.9 g/dL — ABNORMAL LOW (ref 13.0–17.0)
MCH: 34.6 pg — ABNORMAL HIGH (ref 26.0–34.0)
MCHC: 31.6 g/dL (ref 30.0–36.0)
MCV: 109.7 fL — ABNORMAL HIGH (ref 80.0–100.0)
Platelets: 364 K/uL (ref 150–400)
RBC: 2.57 MIL/uL — ABNORMAL LOW (ref 4.22–5.81)
RDW: 16.5 % — ABNORMAL HIGH (ref 11.5–15.5)
WBC: 3.7 K/uL — ABNORMAL LOW (ref 4.0–10.5)
nRBC: 0 % (ref 0.0–0.2)

## 2024-05-30 LAB — MAGNESIUM: Magnesium: 2 mg/dL (ref 1.7–2.4)

## 2024-05-30 LAB — GLUCOSE, CAPILLARY
Glucose-Capillary: 95 mg/dL (ref 70–99)
Glucose-Capillary: 106 mg/dL — ABNORMAL HIGH (ref 70–99)
Glucose-Capillary: 108 mg/dL — ABNORMAL HIGH (ref 70–99)
Glucose-Capillary: 119 mg/dL — ABNORMAL HIGH (ref 70–99)

## 2024-05-30 MED ORDER — ALBUMIN HUMAN 25 % IV SOLN
12.5000 g | Freq: Two times a day (BID) | INTRAVENOUS | Status: AC
  Administered 2024-05-30 – 2024-06-01 (×6): 12.5 g via INTRAVENOUS
  Filled 2024-05-30 (×6): qty 50

## 2024-05-30 NOTE — Progress Notes (Addendum)
 "    Progress Note    Derek Garza  FMW:[id number] DOB: [birth year]  DOA: 05/20/2024 PCP: Valerio Melanie [name], NP      Brief Narrative:    Medical records reviewed and are as summarized below:  Derek Garza is a 64 y.o. male with medical history of HTN, HLD, DMII, BPH, who presented to the hospital with fatigue, general body swelling including abdomen, lower extremities and scrotum.  He also reported progressive weight gain, decreased urination and general weakness.     Assessment/Plan:   Principal Problem:   Anasarca Active Problems:   DM type 2 with diabetic peripheral neuropathy (HCC)   Hypertension associated with diabetes (HCC)   Hypogonadism in male   Bipolar 2 disorder (HCC)   Hyperlipidemia associated with type 2 diabetes mellitus (HCC)   Benign prostatic hyperplasia (BPH) with straining on urination     Body mass index is 28.64 kg/m.    Anasarca with nephrotic range proteinuria: S/p left kidney biopsy on 05/28/2024.  Follow-up pathology report. He remains on IV Lasix .  Follow-up with nephrologist for further recommendations.   Scrotal swelling, right hydrocele: Diffuse scrotal swelling may be related to hypoalbuminemia.  Outpatient follow-up recommended for evaluation of right hydrocele.   Hyponatremia: Sodium down to 129.  Symptomatic.  Continue to monitor.   Leukopenia, macrocytic anemia: Leukopenia has resolved. S/p left iliac bone marrow biopsy on 05/23/2024.  No evidence of acute leukemia.  Immature granulocytes/promyelocytes, immature erythrocyte series noted suggestive of regenerative bone marrow from suspected bone marrow insult per hematologist.  Differential diagnoses include toxicity from Tegretol /Seroquel   enhanced toxicity from hypoalbuminemia. Of note, patient does not want to discontinue psychotropics. Appreciate input from hematologist.   Bipolar disorder: Patient is on Tegretol  and Seroquel .  He does not want to discontinue these  medicines.  He prefers to follow-up with his oupatient psychiatrist. Appreciate input from psychiatrist.   Comorbidities include type II DM, hypertension, hyperlipidemia, BPH, peripheral neuropathy      Diet Order             Diet heart healthy/carb modified Room service appropriate? Yes; Fluid consistency: Thin  Diet effective now                                  Consultants: Nephrologist Psychiatrist Oncologist Interventional radiologist  Procedures: Left renal biopsy on 05/28/2024 Left iliac bone marrow biopsy on 05/23/2024    Medications:    atorvastatin   10 mg Oral Daily   carbamazepine   400 mg Oral BID   furosemide   40 mg Intravenous TID   gabapentin   600 mg Oral BID   insulin  aspart  0-15 Units Subcutaneous TID WC   insulin  aspart  0-5 Units Subcutaneous QHS   QUEtiapine   800 mg Oral QHS   sodium chloride  flush  3 mL Intravenous Q12H   spironolactone   25 mg Oral Daily   Continuous Infusions:   Anti-infectives (From admission, onward)    None              Family Communication/Anticipated D/C date and plan/Code Status   DVT prophylaxis: SCDs Start: 05/20/24 1752     Code Status: Full Code  Family Communication: None Disposition Plan: Plan to discharge home   Status is: Inpatient Remains inpatient appropriate because: Anasarca, nephrotic range proteinuria       Subjective:   He reports pain from fluid overload around the belly.  No other complaints.  Objective:  Vitals:   05/29/24 0444 05/29/24 2058 05/30/24 0430 05/30/24 0825  BP:  (!) 143/81 133/75 135/73  Pulse:  85 90 97  Resp:  16 16 16   Temp:  98.3 F (36.8 C) 98.6 F (37 C) 98.1 F (36.7 C)  TempSrc:  Oral Oral   SpO2:  96% 97% 96%  Weight: 95.8 kg     Height:       No data found.   Intake/Output Summary (Last 24 hours) at 05/30/2024 1050 Last data filed at 05/29/2024 2132 Gross per 24 hour  Intake 243 ml  Output 1800 ml  Net -1557  ml   Filed Weights   05/27/24 1700 05/28/24 0237 05/29/24 0444  Weight: 98.1 kg 98.5 kg 95.8 kg    Exam:  GEN: NAD SKIN: Warm and dry.   EYES: No pallor or icterus ENT: MMM CV: RRR PULM: CTA B ABD: soft, distended, NT, +BS CNS: AAO x 3, non focal EXT: Swelling of bilateral lower extremities PSYCH: Calm and cooperative       Data Reviewed:   I have personally reviewed following labs and imaging studies:  Labs: Labs show the following:   Basic Metabolic Panel: Recent Labs  Lab 05/24/24 0513 05/26/24 0619 05/28/24 0521 05/30/24 0403  NA 133* 132* 132* 129*  K 3.6 4.0 4.1 4.6  CL 98 96* 94* 91*  CO2 28 27 29 29   GLUCOSE 177* 85 126* 119*  BUN 13 12 15 20   CREATININE 0.78 0.72 0.82 1.08  CALCIUM  7.7* 8.2* 8.0* 8.5*  MG  --   --   --  2.0  PHOS  --   --   --  4.8*   GFR Estimated Creatinine Clearance: 83 mL/min (by C-G formula based on SCr of 1.08 mg/dL). Liver Function Tests: Recent Labs  Lab 05/24/24 0859 05/26/24 0619 05/30/24 0403  ALBUMIN  2.3* 2.9* 2.8*   No results for input(s): LIPASE, AMYLASE in the last 168 hours. No results for input(s): AMMONIA in the last 168 hours. Coagulation profile Recent Labs  Lab 05/28/24 0521  INR 0.9    CBC: Recent Labs  Lab 05/23/24 1117 05/24/24 0513 05/26/24 0619 05/28/24 0521 05/30/24 0403  WBC 7.4 5.1 4.8 4.4 3.7*  NEUTROABS 4.4 2.5  --   --   --   HGB 9.7* 9.0* 8.0* 9.0* 8.9*  HCT 30.2* 28.7* 24.7* 28.7* 28.2*  MCV 110.6* 113.9* 109.3* 112.5* 109.7*  PLT 374 325 309 343 364   Cardiac Enzymes: No results for input(s): CKTOTAL, CKMB, CKMBINDEX, TROPONINI in the last 168 hours. BNP (last 3 results) Recent Labs    05/20/24 1816  PROBNP 515.0*   CBG: Recent Labs  Lab 05/29/24 0802 05/29/24 1213 05/29/24 1654 05/29/24 2055 05/30/24 0825  GLUCAP 96 116* 109* 143* 95   D-Dimer: No results for input(s): DDIMER in the last 72 hours. Hgb A1c: No results for input(s):  HGBA1C in the last 72 hours. Lipid Profile: No results for input(s): CHOL, HDL, LDLCALC, TRIG, CHOLHDL, LDLDIRECT in the last 72 hours. Thyroid function studies: No results for input(s): TSH, T4TOTAL, T3FREE, THYROIDAB in the last 72 hours.  Invalid input(s): FREET3 Anemia work up: No results for input(s): VITAMINB12, FOLATE, FERRITIN, TIBC, IRON, RETICCTPCT in the last 72 hours. Sepsis Labs: Recent Labs  Lab 05/24/24 0513 05/26/24 0619 05/28/24 0521 05/30/24 0403  WBC 5.1 4.8 4.4 3.7*    Microbiology No results found for this or any previous visit (from the past 240 hours).   Procedures  and diagnostic studies:  US  BIOPSY (KIDNEY) Result Date: 05/28/2024 INDICATION: Nephrotic range proteinuria EXAM: Ultrasound-guided biopsy MEDICATIONS: None. ANESTHESIA/SEDATION: Moderate (conscious) sedation was employed during this procedure. A total of Versed  1 mg and Fentanyl  50 mcg was administered intravenously by the radiology nurse. Total intra-service moderate Sedation Time: 16 minutes. The patient's level of consciousness and vital signs were monitored continuously by radiology nursing throughout the procedure under my direct supervision. COMPLICATIONS: None immediate. PROCEDURE: Informed written consent was obtained from the patient after a thorough discussion of the procedural risks, benefits and alternatives. All questions were addressed. Maximal Sterile Barrier Technique was utilized including caps, mask, sterile gowns, sterile gloves, sterile drape, hand hygiene and skin antiseptic. A timeout was performed prior to the initiation of the procedure. In a prone position both flank regions were evaluated for percutaneous access. The left kidney was better visualized and chosen for the target of the renal biopsy. The left flank region was then prepped and draped in usual sterile fashion. Local anesthesia was achieved with 1% lidocaine . Small incision was made  in the left flank region. A 17 gauge access needle was then advanced through the incision under ultrasound guidance to the lower pole the left kidney. The needle was advanced into the renal parenchyma. The stylet was removed. An 18 gauge BioPince needle was then advanced under ultrasound guidance through the introducer needle and 2 core needle biopsies were obtained in sequential fashion. A small amount of Gel-Foam was then injected through the access needle and the needle was removed. Final image of the left kidney demonstrates no acute hemorrhage. Sample sent to laboratory for further evaluation and processing. IMPRESSION: Satisfactory core needle biopsy of the lower pole of the left kidney. Electronically Signed   By: Cordella Banner   On: 05/28/2024 14:40               LOS: 8 days   Lizzete Gough  [facility]   Pager on [email or web address]. If 7PM-7AM, please contact night-coverage at [email or web address]     05/30/2024, 10:50 AM           "

## 2024-05-30 NOTE — Progress Notes (Signed)
 " [facility]  ROUNDING NOTE   Subjective:   Sitting in chair Feels edema has improved some Room air  Daily weight 95.8kg (98.5)  Objective:  Vital signs in last 24 hours:  Temp:  [98.1 F (36.7 C)-98.6 F (37 C)] 98.1 F (36.7 C) (01/02 0825) Pulse Rate:  [85-97] 97 (01/02 0825) Resp:  [16] 16 (01/02 0825) BP: (133-143)/(73-81) 135/73 (01/02 0825) SpO2:  [96 %-97 %] 96 % (01/02 0825)  Weight change:  Filed Weights   05/27/24 1700 05/28/24 0237 05/29/24 0444  Weight: 98.1 kg 98.5 kg 95.8 kg    Intake/Output: I/O last 3 completed shifts: In: 1206 [P.O.:1200; I.V.:6] Out: 3700 [Urine:3700]   Intake/Output this shift:  Total I/O In: -  Out: 1550 [Urine:1550]  Physical Exam: General: NAD  Head: Normocephalic  Eyes: Anicteric  Lungs:  Clear, on room air  Heart: Regular rate  Abdomen:  Soft, nontender,   Extremities:  ++ peripheral edema  Neurologic: Alert, conversant  Skin: No lesions  GU scrotal edema  Access: none    Basic Metabolic Panel: Recent Labs  Lab 05/24/24 0513 05/26/24 0619 05/28/24 0521 05/30/24 0403  NA 133* 132* 132* 129*  K 3.6 4.0 4.1 4.6  CL 98 96* 94* 91*  CO2 28 27 29 29   GLUCOSE 177* 85 126* 119*  BUN 13 12 15 20   CREATININE 0.78 0.72 0.82 1.08  CALCIUM  7.7* 8.2* 8.0* 8.5*  MG  --   --   --  2.0  PHOS  --   --   --  4.8*    Liver Function Tests: Recent Labs  Lab 05/24/24 0859 05/26/24 0619 05/30/24 0403  ALBUMIN  2.3* 2.9* 2.8*   No results for input(s): LIPASE, AMYLASE in the last 168 hours. No results for input(s): AMMONIA in the last 168 hours.  CBC: Recent Labs  Lab 05/24/24 0513 05/26/24 0619 05/28/24 0521 05/30/24 0403  WBC 5.1 4.8 4.4 3.7*  NEUTROABS 2.5  --   --   --   HGB 9.0* 8.0* 9.0* 8.9*  HCT 28.7* 24.7* 28.7* 28.2*  MCV 113.9* 109.3* 112.5* 109.7*  PLT 325 309 343 364    Cardiac Enzymes: No results for input(s): CKTOTAL, CKMB, CKMBINDEX, TROPONINI in the last 168  hours.  BNP: Invalid input(s): POCBNP  CBG: Recent Labs  Lab 05/29/24 1213 05/29/24 1654 05/29/24 2055 05/30/24 0825 05/30/24 1157  GLUCAP 116* 109* 143* 95 106*    Microbiology: Results for orders placed or performed in visit on 05/19/24  Microscopic Examination     Status: None   Collection Time: 05/19/24 11:35 AM   Urine  Result Value Ref Range Status   WBC, UA None seen 0 - 5 /hpf Final   RBC, Urine 0-2 0 - 2 /hpf Final   Epithelial Cells (non renal) None seen 0 - 10 /hpf Final   Bacteria, UA None seen None seen/Few Final  Urine Culture     Status: None   Collection Time: 05/19/24  1:19 PM   Specimen: Urine   UR  Result Value Ref Range Status   Urine Culture, Routine Final report  Final   Organism ID, Bacteria Comment  Final    Comment: Mixed urogenital flora Less than 10,000 colonies/mL     Coagulation Studies: Recent Labs    05/28/24 0521  LABPROT 13.0  INR 0.9     Urinalysis: No results for input(s): COLORURINE, LABSPEC, PHURINE, GLUCOSEU, HGBUR, BILIRUBINUR, KETONESUR, PROTEINUR, UROBILINOGEN, NITRITE, LEUKOCYTESUR in the last 72  hours.  Invalid input(s): APPERANCEUR     Imaging: No results found.     Medications:    albumin  human       atorvastatin   10 mg Oral Daily   carbamazepine   400 mg Oral BID   furosemide   40 mg Intravenous TID   gabapentin   600 mg Oral BID   insulin  aspart  0-15 Units Subcutaneous TID WC   insulin  aspart  0-5 Units Subcutaneous QHS   QUEtiapine   800 mg Oral QHS   sodium chloride  flush  3 mL Intravenous Q12H   spironolactone   25 mg Oral Daily   acetaminophen  **OR** acetaminophen , ondansetron  **OR** ondansetron  (ZOFRAN ) IV, oxyCODONE , senna-docusate  Assessment/ Plan:  Mr. Jimie Kuwahara is a 64 y.o.  male with hypertension, hyperlipidemia, diabetes mellitus type II, and BPH, who was admitted to [facility] on 05/20/2024 for Anasarca [R60.1] Scrotal swelling [N50.89] Anemia, unspecified  type [D64.9] Leukopenia, unspecified type [D72.819]   Anasarca with nephrotic range proteinuria  Edema remains present but patient feels it's improving in some places. Continue IV Furosemide  40mg  TID and spironolactone . Metolazone 2.5mg  given on 1/1. Urine output 2.3L. Continue IV Furosemide  as ordered.   Hypertension, essential  Currently off all antihypertensives. Blood pressure stable    Diabetes mellitus type II with renal manifestations  Glucose within optimal range. Primary team to continue management   Hypoalbuminemia  Albumin  2.8. Will reorder Albumin  IV 12.5G twice daily.   5. Anemia, Macrocytic Will monitor need for ESA  Latest Reference Range & Units 05/24/24 05:13  Hemoglobin 13.0 - 17.0 g/dL 9.0 (L)  HCT 60.9 - 47.9 % 28.7 (L)  MCV 80.0 - 100.0 fL 113.9 (H)  MCH 26.0 - 34.0 pg 35.7 (H)  MCHC 30.0 - 36.0 g/dL 68.5  RDW 88.4 - 84.4 % 18.2 (H)    Differential includes acute glomerulopathy, myeloplastic disease, progressive diabetic nephropathy, hepatic cirrhosis or congestive heart failure.  - serologic work up negative.  - appreciate hematology input. Bone marrow aspiration completed - Renal biopsy completed on 12/31   LOS: 8 [place] 1/2/20263:23 PM   "

## 2024-05-30 NOTE — Plan of Care (Signed)

## 2024-05-31 DIAGNOSIS — R601 Generalized edema: Secondary | ICD-10-CM | POA: Diagnosis not present

## 2024-05-31 LAB — GLUCOSE, CAPILLARY
Glucose-Capillary: 137 mg/dL — ABNORMAL HIGH (ref 70–99)
Glucose-Capillary: 138 mg/dL — ABNORMAL HIGH (ref 70–99)
Glucose-Capillary: 98 mg/dL (ref 70–99)
Glucose-Capillary: 106 mg/dL — ABNORMAL HIGH (ref 70–99)

## 2024-05-31 LAB — SODIUM: Sodium: 129 mmol/L — ABNORMAL LOW (ref 135–145)

## 2024-05-31 LAB — CREATININE, SERUM
Creatinine, Ser: 1.04 mg/dL (ref 0.61–1.24)
GFR, Estimated: >60 mL/min

## 2024-05-31 NOTE — Progress Notes (Signed)
 "    Progress Note    Derek Garza  FMW:[id number] DOB: [birth year]  DOA: 05/20/2024 PCP: Valerio Melanie [name], NP      Brief Narrative:    Medical records reviewed and are as summarized below:  Derek Garza is a 64 y.o. male with medical history of HTN, HLD, DMII, BPH, who presented to the hospital with fatigue, general body swelling including abdomen, lower extremities and scrotum.  He also reported progressive weight gain, decreased urination and general weakness.     Assessment/Plan:   Principal Problem:   Anasarca Active Problems:   DM type 2 with diabetic peripheral neuropathy (HCC)   Hypertension associated with diabetes (HCC)   Hypogonadism in male   Bipolar 2 disorder (HCC)   Hyperlipidemia associated with type 2 diabetes mellitus (HCC)   Benign prostatic hyperplasia (BPH) with straining on urination     Body mass index is 27.8 kg/m.    Anasarca with nephrotic range proteinuria: S/p left kidney biopsy on 05/28/2024.  Pathology report is still pending. Case discussed with Dr. Dennise, nephrologist.  Continue IV Lasix  for now.   He has adequate urine output.   Scrotal swelling, right hydrocele: Diffuse scrotal swelling may be related to hypoalbuminemia.  Outpatient follow-up recommended for evaluation of right hydrocele.   Hyponatremia: Sodium level is stable at 129.  Symptomatic.  Continue to monitor.   Leukopenia, macrocytic anemia: Leukopenia has resolved. S/p left iliac bone marrow biopsy on 05/23/2024.  No evidence of acute leukemia.  Immature granulocytes/promyelocytes, immature erythrocyte series noted suggestive of regenerative bone marrow from suspected bone marrow insult per hematologist.  Differential diagnoses include toxicity from Tegretol /Seroquel   enhanced toxicity from hypoalbuminemia. Of note, patient does not want to discontinue psychotropics. Appreciate input from hematologist.   Bipolar disorder: Patient is on Tegretol  and Seroquel .   He does not want to discontinue these medicines.  He prefers to follow-up with his oupatient psychiatrist. Appreciate input from psychiatrist.   Comorbidities include type II DM, hypertension, hyperlipidemia, BPH, peripheral neuropathy      Diet Order             Diet heart healthy/carb modified Room service appropriate? Yes; Fluid consistency: Thin  Diet effective now                                  Consultants: Nephrologist Psychiatrist Oncologist Interventional radiologist  Procedures: Left renal biopsy on 05/28/2024 Left iliac bone marrow biopsy on 05/23/2024    Medications:    atorvastatin   10 mg Oral Daily   carbamazepine   400 mg Oral BID   furosemide   40 mg Intravenous TID   gabapentin   600 mg Oral BID   insulin  aspart  0-15 Units Subcutaneous TID WC   insulin  aspart  0-5 Units Subcutaneous QHS   QUEtiapine   800 mg Oral QHS   sodium chloride  flush  3 mL Intravenous Q12H   spironolactone   25 mg Oral Daily   Continuous Infusions:  albumin  human 12.5 g (05/31/24 1003)     Anti-infectives (From admission, onward)    None              Family Communication/Anticipated D/C date and plan/Code Status   DVT prophylaxis: SCDs Start: 05/20/24 1752     Code Status: Full Code  Family Communication: None Disposition Plan: Plan to discharge home   Status is: Inpatient Remains inpatient appropriate because: Anasarca, nephrotic range proteinuria  Subjective:   No complaints.  He feels better today.  No chest pain, palpitations or shortness of breath.  Abdominal swelling is improving but he still has some swelling in the lower extremities though that is improving as well.  Objective:    Vitals:   05/31/24 0336 05/31/24 0852 05/31/24 0945 05/31/24 1443  BP: 123/60 131/62  136/68  Pulse: 89 91  91  Resp: 18 16    Temp: 98.1 F (36.7 C) (!) 97.3 F (36.3 C)  97.8 F (36.6 C)  TempSrc:      SpO2: 96% 93%  97%   Weight:   93 kg   Height:       No data found.   Intake/Output Summary (Last 24 hours) at 05/31/2024 1506 Last data filed at 05/31/2024 1412 Gross per 24 hour  Intake 492.78 ml  Output --  Net 492.78 ml   Filed Weights   05/28/24 0237 05/29/24 0444 05/31/24 0945  Weight: 98.5 kg 95.8 kg 93 kg    Exam:  GEN: NAD SKIN: Warm and dry EYES: No pallor or icterus ENT: MMM CV: RRR PULM: CTA B ABD: soft, no distention has improved NT, +BS CNS: AAO x 3, non focal EXT: Persistent swelling of bilateral lower extremities        Data Reviewed:   I have personally reviewed following labs and imaging studies:  Labs: Labs show the following:   Basic Metabolic Panel: Recent Labs  Lab 05/26/24 0619 05/28/24 0521 05/30/24 0403 05/31/24 0457  NA 132* 132* 129* 129*  K 4.0 4.1 4.6  --   CL 96* 94* 91*  --   CO2 27 29 29   --   GLUCOSE 85 126* 119*  --   BUN 12 15 20   --   CREATININE 0.72 0.82 1.08 1.04  CALCIUM  8.2* 8.0* 8.5*  --   MG  --   --  2.0  --   PHOS  --   --  4.8*  --    GFR Estimated Creatinine Clearance: 78.8 mL/min (by C-G formula based on SCr of 1.04 mg/dL). Liver Function Tests: Recent Labs  Lab 05/26/24 0619 05/30/24 0403  ALBUMIN  2.9* 2.8*   No results for input(s): LIPASE, AMYLASE in the last 168 hours. No results for input(s): AMMONIA in the last 168 hours. Coagulation profile Recent Labs  Lab 05/28/24 0521  INR 0.9    CBC: Recent Labs  Lab 05/26/24 0619 05/28/24 0521 05/30/24 0403  WBC 4.8 4.4 3.7*  HGB 8.0* 9.0* 8.9*  HCT 24.7* 28.7* 28.2*  MCV 109.3* 112.5* 109.7*  PLT 309 343 364   Cardiac Enzymes: No results for input(s): CKTOTAL, CKMB, CKMBINDEX, TROPONINI in the last 168 hours. BNP (last 3 results) Recent Labs    05/20/24 1816  PROBNP 515.0*   CBG: Recent Labs  Lab 05/30/24 1157 05/30/24 1707 05/30/24 2228 05/31/24 0854 05/31/24 1143  GLUCAP 106* 108* 119* 137* 138*   D-Dimer: No results for  input(s): DDIMER in the last 72 hours. Hgb A1c: No results for input(s): HGBA1C in the last 72 hours. Lipid Profile: No results for input(s): CHOL, HDL, LDLCALC, TRIG, CHOLHDL, LDLDIRECT in the last 72 hours. Thyroid function studies: No results for input(s): TSH, T4TOTAL, T3FREE, THYROIDAB in the last 72 hours.  Invalid input(s): FREET3 Anemia work up: No results for input(s): VITAMINB12, FOLATE, FERRITIN, TIBC, IRON, RETICCTPCT in the last 72 hours. Sepsis Labs: Recent Labs  Lab 05/26/24 0619 05/28/24 0521 05/30/24 0403  WBC 4.8 4.4  3.7*    Microbiology No results found for this or any previous visit (from the past 240 hours).   Procedures and diagnostic studies:  No results found.              LOS: 9 days   Derek Garza  [facility] on [email or web address]. If 7PM-7AM, please contact night-coverage at [email or web address]     05/31/2024, 3:06 PM           "

## 2024-05-31 NOTE — Progress Notes (Signed)
 " [facility]  ROUNDING NOTE   Subjective:   Patient seen sitting up in chair Alert and oriented Agrees that improvements are being made with his edema  Urine output 1550 mL Daily weight 95.8kg (98.5)  Objective:  Vital signs in last 24 hours:  Temp:  [97.3 F (36.3 C)-98.1 F (36.7 C)] 97.3 F (36.3 C) (01/03 0852) Pulse Rate:  [76-91] 91 (01/03 0852) Resp:  [16-18] 16 (01/03 0852) BP: (123-147)/(50-71) 131/62 (01/03 0852) SpO2:  [93 %-97 %] 93 % (01/03 0852)  Weight change:  Filed Weights   05/27/24 1700 05/28/24 0237 05/29/24 0444  Weight: 98.1 kg 98.5 kg 95.8 kg    Intake/Output: I/O last 3 completed shifts: In: 15.8 [I.V.:3; IV Piggyback:12.8] Out: 2650 [Urine:2650]   Intake/Output this shift:  Total I/O In: 240 [P.O.:240] Out: -   Physical Exam: General: NAD  Head: Normocephalic  Eyes: Anicteric  Lungs:  Clear, on room air  Heart: Regular rate  Abdomen:  Soft, nontender,   Extremities:  ++ peripheral edema  Neurologic: Alert, conversant  Skin: No lesions  GU scrotal edema  Access: none    Basic Metabolic Panel: Recent Labs  Lab 05/26/24 0619 05/28/24 0521 05/30/24 0403 05/31/24 0457  NA 132* 132* 129* 129*  K 4.0 4.1 4.6  --   CL 96* 94* 91*  --   CO2 27 29 29   --   GLUCOSE 85 126* 119*  --   BUN 12 15 20   --   CREATININE 0.72 0.82 1.08 1.04  CALCIUM  8.2* 8.0* 8.5*  --   MG  --   --  2.0  --   PHOS  --   --  4.8*  --     Liver Function Tests: Recent Labs  Lab 05/26/24 0619 05/30/24 0403  ALBUMIN  2.9* 2.8*   No results for input(s): LIPASE, AMYLASE in the last 168 hours. No results for input(s): AMMONIA in the last 168 hours.  CBC: Recent Labs  Lab 05/26/24 0619 05/28/24 0521 05/30/24 0403  WBC 4.8 4.4 3.7*  HGB 8.0* 9.0* 8.9*  HCT 24.7* 28.7* 28.2*  MCV 109.3* 112.5* 109.7*  PLT 309 343 364    Cardiac Enzymes: No results for input(s): CKTOTAL, CKMB, CKMBINDEX, TROPONINI in the last 168  hours.  BNP: Invalid input(s): POCBNP  CBG: Recent Labs  Lab 05/30/24 0825 05/30/24 1157 05/30/24 1707 05/30/24 2228 05/31/24 0854  GLUCAP 95 106* 108* 119* 137*    Microbiology: Results for orders placed or performed in visit on 05/19/24  Microscopic Examination     Status: None   Collection Time: 05/19/24 11:35 AM   Urine  Result Value Ref Range Status   WBC, UA None seen 0 - 5 /hpf Final   RBC, Urine 0-2 0 - 2 /hpf Final   Epithelial Cells (non renal) None seen 0 - 10 /hpf Final   Bacteria, UA None seen None seen/Few Final  Urine Culture     Status: None   Collection Time: 05/19/24  1:19 PM   Specimen: Urine   UR  Result Value Ref Range Status   Urine Culture, Routine Final report  Final   Organism ID, Bacteria Comment  Final    Comment: Mixed urogenital flora Less than 10,000 colonies/mL     Coagulation Studies: No results for input(s): LABPROT, INR in the last 72 hours.    Urinalysis: No results for input(s): COLORURINE, LABSPEC, PHURINE, GLUCOSEU, HGBUR, BILIRUBINUR, KETONESUR, PROTEINUR, UROBILINOGEN, NITRITE, LEUKOCYTESUR in the last 72  hours.  Invalid input(s): APPERANCEUR     Imaging: No results found.     Medications:    albumin  human 12.5 g (05/31/24 1003)     atorvastatin   10 mg Oral Daily   carbamazepine   400 mg Oral BID   furosemide   40 mg Intravenous TID   gabapentin   600 mg Oral BID   insulin  aspart  0-15 Units Subcutaneous TID WC   insulin  aspart  0-5 Units Subcutaneous QHS   QUEtiapine   800 mg Oral QHS   sodium chloride  flush  3 mL Intravenous Q12H   spironolactone   25 mg Oral Daily   acetaminophen  **OR** acetaminophen , ondansetron  **OR** ondansetron  (ZOFRAN ) IV, oxyCODONE , senna-docusate  Assessment/ Plan:  [name] is a 64 y.o.  male with hypertension, hyperlipidemia, diabetes mellitus type II, and BPH, who was admitted to [facility] on 05/20/2024 for Anasarca [R60.1] Scrotal swelling  [N50.89] Anemia, unspecified type [D64.9] Leukopenia, unspecified type [D72.819]   Anasarca with nephrotic range proteinuria.  Renal biopsy completed on 12/31, awaiting preliminary results.  Edema is improving with prescribed IV furosemide .  Continue IV Furosemide  40mg  TID and spironolactone . Metolazone  2.5mg  given on 1/1.  Maintaining adequate urine output.  Continue daily weights.  Receiving IV albumin  twice daily for 3 days.  Hypertension, essential  Currently off all antihypertensives. Blood pressure remains stable    Diabetes mellitus type II with renal manifestations  Glucose well-controlled by primary team.  Hypoalbuminemia  Albumin  2.8.  Continue albumin  IV 12.5G twice daily for 3 days  5. Anemia, Macrocytic Will monitor need for ESA  Latest Reference Range & Units 05/24/24 05:13  Hemoglobin 13.0 - 17.0 g/dL 9.0 (L)  HCT 60.9 - 47.9 % 28.7 (L)  MCV 80.0 - 100.0 fL 113.9 (H)  MCH 26.0 - 34.0 pg 35.7 (H)  MCHC 30.0 - 36.0 g/dL 68.5  RDW 88.4 - 84.4 % 18.2 (H)    Differential includes acute glomerulopathy, myeloplastic disease, progressive diabetic nephropathy, hepatic cirrhosis or congestive heart failure.  - serologic work up negative.  - appreciate hematology input. Bone marrow aspiration completed - Renal biopsy completed on 12/31   LOS: 9 Ziasia Lenoir 1/3/202611:38 AM   "

## 2024-05-31 NOTE — Plan of Care (Signed)
  Problem: Education: Goal: Knowledge of General Education information will improve Description: Including pain rating scale, medication(s)/side effects and non-pharmacologic comfort measures Outcome: Progressing   Problem: Clinical Measurements: Goal: Ability to maintain clinical measurements within normal limits will improve Outcome: Progressing Goal: Will remain free from infection Outcome: Progressing Goal: Diagnostic test results will improve Outcome: Progressing Goal: Respiratory complications will improve Outcome: Progressing Goal: Cardiovascular complication will be avoided Outcome: Progressing   Problem: Activity: Goal: Risk for activity intolerance will decrease Outcome: Progressing   Problem: Nutrition: Goal: Adequate nutrition will be maintained Outcome: Progressing   Problem: Coping: Goal: Level of anxiety will decrease Outcome: Progressing   Problem: Pain Managment: Goal: General experience of comfort will improve and/or be controlled Outcome: Progressing   Problem: Safety: Goal: Ability to remain free from injury will improve Outcome: Progressing

## 2024-06-01 DIAGNOSIS — R601 Generalized edema: Secondary | ICD-10-CM | POA: Diagnosis not present

## 2024-06-01 LAB — RENAL FUNCTION PANEL
Albumin: 3.1 g/dL — ABNORMAL LOW (ref 3.5–5.0)
Anion gap: 8 (ref 5–15)
BUN: 21 mg/dL (ref 8–23)
CO2: 31 mmol/L (ref 22–32)
Calcium: 8.5 mg/dL — ABNORMAL LOW (ref 8.9–10.3)
Chloride: 92 mmol/L — ABNORMAL LOW (ref 98–111)
Creatinine, Ser: 0.97 mg/dL (ref 0.61–1.24)
GFR, Estimated: >60 mL/min
Glucose, Bld: 110 mg/dL — ABNORMAL HIGH (ref 70–99)
Phosphorus: 4.4 mg/dL (ref 2.5–4.6)
Potassium: 4.3 mmol/L (ref 3.5–5.1)
Sodium: 131 mmol/L — ABNORMAL LOW (ref 135–145)

## 2024-06-01 LAB — COPPER, SERUM: Copper: 8 ug/dL — ABNORMAL LOW (ref 69–132)

## 2024-06-01 LAB — CBC
HCT: 24.4 % — ABNORMAL LOW (ref 39.0–52.0)
Hemoglobin: 8 g/dL — ABNORMAL LOW (ref 13.0–17.0)
MCH: 35.2 pg — ABNORMAL HIGH (ref 26.0–34.0)
MCHC: 32.8 g/dL (ref 30.0–36.0)
MCV: 107.5 fL — ABNORMAL HIGH (ref 80.0–100.0)
Platelets: 333 K/uL (ref 150–400)
RBC: 2.27 MIL/uL — ABNORMAL LOW (ref 4.22–5.81)
RDW: 16.2 % — ABNORMAL HIGH (ref 11.5–15.5)
WBC: 3.2 K/uL — ABNORMAL LOW (ref 4.0–10.5)
nRBC: 0 % (ref 0.0–0.2)

## 2024-06-01 LAB — GLUCOSE, CAPILLARY
Glucose-Capillary: 110 mg/dL — ABNORMAL HIGH (ref 70–99)
Glucose-Capillary: 103 mg/dL — ABNORMAL HIGH (ref 70–99)
Glucose-Capillary: 123 mg/dL — ABNORMAL HIGH (ref 70–99)
Glucose-Capillary: 130 mg/dL — ABNORMAL HIGH (ref 70–99)

## 2024-06-01 LAB — MAGNESIUM: Magnesium: 2 mg/dL (ref 1.7–2.4)

## 2024-06-01 MED ORDER — METOLAZONE 2.5 MG PO TABS
2.5000 mg | ORAL_TABLET | Freq: Once | ORAL | Status: AC
  Administered 2024-06-01: 2.5 mg via ORAL
  Filled 2024-06-01: qty 1

## 2024-06-01 NOTE — Plan of Care (Signed)
  Problem: Education: Goal: Knowledge of General Education information will improve Description: Including pain rating scale, medication(s)/side effects and non-pharmacologic comfort measures Outcome: Progressing   Problem: Clinical Measurements: Goal: Ability to maintain clinical measurements within normal limits will improve Outcome: Progressing Goal: Will remain free from infection Outcome: Progressing Goal: Diagnostic test results will improve Outcome: Progressing Goal: Respiratory complications will improve Outcome: Progressing Goal: Cardiovascular complication will be avoided Outcome: Progressing   Problem: Activity: Goal: Risk for activity intolerance will decrease Outcome: Progressing   Problem: Pain Managment: Goal: General experience of comfort will improve and/or be controlled Outcome: Progressing   Problem: Safety: Goal: Ability to remain free from injury will improve Outcome: Progressing

## 2024-06-01 NOTE — Plan of Care (Signed)
" °  Problem: Clinical Measurements: Goal: Ability to maintain clinical measurements within normal limits will improve Outcome: Progressing   Problem: Coping: Goal: Level of anxiety will decrease Outcome: Progressing   Problem: Safety: Goal: Ability to remain free from injury will improve Outcome: Progressing   Problem: Pain Managment: Goal: General experience of comfort will improve and/or be controlled Outcome: Progressing   Problem: Coping: Goal: Ability to adjust to condition or change in health will improve Outcome: Progressing   Problem: Elimination: Goal: Will not experience complications related to bowel motility Outcome: Progressing   "

## 2024-06-01 NOTE — Progress Notes (Signed)
 " [facility]  ROUNDING NOTE   Subjective:   Patient sitting in chair In good spirits Edema continues to improve slowly   Daily weight 92.1 kg (93.0) (95.8) (98.5)  Objective:  Vital signs in last 24 hours:  Temp:  [97.7 F (36.5 C)-98.2 F (36.8 C)] 98.2 F (36.8 C) (01/04 0754) Pulse Rate:  [77-91] 84 (01/04 0754) Resp:  [16-19] 19 (01/04 0754) BP: (130-138)/(61-68) 130/61 (01/04 0754) SpO2:  [96 %-99 %] 97 % (01/04 0754) Weight:  [92.1 kg] 92.1 kg (01/04 0500)  Weight change:  Filed Weights   05/29/24 0444 05/31/24 0945 06/01/24 0500  Weight: 95.8 kg 93 kg 92.1 kg    Intake/Output: I/O last 3 completed shifts: In: 723 [P.O.:720; I.V.:3] Out: -    Intake/Output this shift:  No intake/output data recorded.  Physical Exam: General: NAD  Head: Normocephalic  Eyes: Anicteric  Lungs:  Clear, on room air  Heart: Regular rate  Abdomen:  Soft, nontender,   Extremities:  ++ peripheral edema  Neurologic: Alert, conversant  Skin: No lesions  GU scrotal edema  Access: none    Basic Metabolic Panel: Recent Labs  Lab 05/26/24 0619 05/28/24 0521 05/30/24 0403 05/31/24 0457 06/01/24 0543  NA 132* 132* 129* 129* 131*  K 4.0 4.1 4.6  --  4.3  CL 96* 94* 91*  --  92*  CO2 27 29 29   --  31  GLUCOSE 85 126* 119*  --  110*  BUN 12 15 20   --  21  CREATININE 0.72 0.82 1.08 1.04 0.97  CALCIUM  8.2* 8.0* 8.5*  --  8.5*  MG  --   --  2.0  --  2.0  PHOS  --   --  4.8*  --  4.4    Liver Function Tests: Recent Labs  Lab 05/26/24 0619 05/30/24 0403 06/01/24 0543  ALBUMIN  2.9* 2.8* 3.1*   No results for input(s): LIPASE, AMYLASE in the last 168 hours. No results for input(s): AMMONIA in the last 168 hours.  CBC: Recent Labs  Lab 05/26/24 0619 05/28/24 0521 05/30/24 0403 06/01/24 0543  WBC 4.8 4.4 3.7* 3.2*  HGB 8.0* 9.0* 8.9* 8.0*  HCT 24.7* 28.7* 28.2* 24.4*  MCV 109.3* 112.5* 109.7* 107.5*  PLT 309 343 364 333    Cardiac  Enzymes: No results for input(s): CKTOTAL, CKMB, CKMBINDEX, TROPONINI in the last 168 hours.  BNP: Invalid input(s): POCBNP  CBG: Recent Labs  Lab 05/31/24 1143 05/31/24 1645 05/31/24 2151 06/01/24 0755 06/01/24 1155  GLUCAP 138* 98 106* 110* 103*    Microbiology: Results for orders placed or performed in visit on 05/19/24  Microscopic Examination     Status: None   Collection Time: 05/19/24 11:35 AM   Urine  Result Value Ref Range Status   WBC, UA None seen 0 - 5 /hpf Final   RBC, Urine 0-2 0 - 2 /hpf Final   Epithelial Cells (non renal) None seen 0 - 10 /hpf Final   Bacteria, UA None seen None seen/Few Final  Urine Culture     Status: None   Collection Time: 05/19/24  1:19 PM   Specimen: Urine   UR  Result Value Ref Range Status   Urine Culture, Routine Final report  Final   Organism ID, Bacteria Comment  Final    Comment: Mixed urogenital flora Less than 10,000 colonies/mL     Coagulation Studies: No results for input(s): LABPROT, INR in the last 72 hours.    Urinalysis:  No results for input(s): COLORURINE, LABSPEC, PHURINE, GLUCOSEU, HGBUR, BILIRUBINUR, KETONESUR, PROTEINUR, UROBILINOGEN, NITRITE, LEUKOCYTESUR in the last 72 hours.  Invalid input(s): APPERANCEUR     Imaging: No results found.     Medications:    albumin  human 12.5 g (06/01/24 0941)     atorvastatin   10 mg Oral Daily   carbamazepine   400 mg Oral BID   furosemide   40 mg Intravenous TID   gabapentin   600 mg Oral BID   insulin  aspart  0-15 Units Subcutaneous TID WC   insulin  aspart  0-5 Units Subcutaneous QHS   metolazone   2.5 mg Oral Once   QUEtiapine   800 mg Oral QHS   sodium chloride  flush  3 mL Intravenous Q12H   spironolactone   25 mg Oral Daily   acetaminophen  **OR** acetaminophen , ondansetron  **OR** ondansetron  (ZOFRAN ) IV, oxyCODONE , senna-docusate  Assessment/ Plan:  [name] is a 64 y.o.  male with hypertension,  hyperlipidemia, diabetes mellitus type II, and BPH, who was admitted to [facility] on 05/20/2024 for Anasarca [R60.1] Scrotal swelling [N50.89] Anemia, unspecified type [D64.9] Leukopenia, unspecified type [D72.819]   Anasarca with nephrotic range proteinuria.  Renal biopsy completed on 12/31, awaiting preliminary results.  Edema is improving with prescribed IV furosemide .  Continue IV Furosemide  40mg  TID and spironolactone . Metolazone  2.5mg  given on 1/1, will receive another dose today.  Continue daily weights.  Receiving IV albumin  twice daily for 3 days, today will be last day.  Hypertension, essential  Currently off all antihypertensives. Blood pressure acceptable     Diabetes mellitus type II with renal manifestations  Glucose well-controlled by primary team.  Hypoalbuminemia  Albumin  3.1 today.  Continue albumin  IV 12.5G twice daily for today  5. Anemia, Macrocytic Will monitor need for ESA  Latest Reference Range & Units 05/24/24 05:13  Hemoglobin 13.0 - 17.0 g/dL 9.0 (L)  HCT 60.9 - 47.9 % 28.7 (L)  MCV 80.0 - 100.0 fL 113.9 (H)  MCH 26.0 - 34.0 pg 35.7 (H)  MCHC 30.0 - 36.0 g/dL 68.5  RDW 88.4 - 84.4 % 18.2 (H)    Differential includes acute glomerulopathy, myeloplastic disease, progressive diabetic nephropathy, hepatic cirrhosis or congestive heart failure.  - serologic work up negative.  - appreciate hematology input. Bone marrow aspiration completed - Renal biopsy completed on 12/31   LOS: 10 Derek Garza 1/4/202612:02 PM   "

## 2024-06-01 NOTE — Progress Notes (Signed)
 "    Progress Note    Derek Garza  FMW:[id number] DOB: 09-Jan-1961  DOA: 05/20/2024 PCP: Valerio Melanie [name], NP      Brief Narrative:    Medical records reviewed and are as summarized below:  Derek Garza is a 64 y.o. male with medical history of HTN, HLD, DMII, BPH, who presented to the hospital with fatigue, general body swelling including abdomen, lower extremities and scrotum.  He also reported progressive weight gain, decreased urination and general weakness.     Assessment/Plan:   Principal Problem:   Anasarca Active Problems:   DM type 2 with diabetic peripheral neuropathy (HCC)   Hypertension associated with diabetes (HCC)   Hypogonadism in male   Bipolar 2 disorder (HCC)   Hyperlipidemia associated with type 2 diabetes mellitus (HCC)   Benign prostatic hyperplasia (BPH) with straining on urination     Body mass index is 27.55 kg/m.    Anasarca with nephrotic range proteinuria: S/p left kidney biopsy on 05/28/2024.  Pathology report is still pending. Still has significant swelling in the lower extremities but her abdominal swelling is better.   Scrotal swelling, right hydrocele: Diffuse scrotal swelling may be related to hypoalbuminemia.  Outpatient follow-up recommended for evaluation of right hydrocele.   Hyponatremia: Sodium level is stable at 131.  Symptomatic.  Continue to monitor.   Leukopenia, macrocytic anemia: Leukopenia has resolved. S/p left iliac bone marrow biopsy on 05/23/2024.  No evidence of acute leukemia.  Immature granulocytes/promyelocytes, immature erythrocyte series noted suggestive of regenerative bone marrow from suspected bone marrow insult per hematologist.  Differential diagnoses include toxicity from Tegretol /Seroquel   enhanced toxicity from hypoalbuminemia. Of note, patient does not want to discontinue psychotropics. Appreciate input from hematologist.   Bipolar disorder: Patient is on Tegretol  and Seroquel .  He does not  want to discontinue these medicines.  He prefers to follow-up with his oupatient psychiatrist. Appreciate input from psychiatrist.   Comorbidities include type II DM, hypertension, hyperlipidemia, BPH, peripheral neuropathy      Diet Order             Diet heart healthy/carb modified Room service appropriate? Yes; Fluid consistency: Thin  Diet effective now                                  Consultants: Nephrologist Psychiatrist Oncologist Interventional radiologist  Procedures: Left renal biopsy on 05/28/2024 Left iliac bone marrow biopsy on 05/23/2024    Medications:    atorvastatin   10 mg Oral Daily   carbamazepine   400 mg Oral BID   furosemide   40 mg Intravenous TID   gabapentin   600 mg Oral BID   insulin  aspart  0-15 Units Subcutaneous TID WC   insulin  aspart  0-5 Units Subcutaneous QHS   QUEtiapine   800 mg Oral QHS   sodium chloride  flush  3 mL Intravenous Q12H   spironolactone   25 mg Oral Daily   Continuous Infusions:  albumin  human 12.5 g (06/01/24 0941)     Anti-infectives (From admission, onward)    None              Family Communication/Anticipated D/C date and plan/Code Status   DVT prophylaxis: SCDs Start: 05/20/24 1752     Code Status: Full Code  Family Communication: None Disposition Plan: Plan to discharge home   Status is: Inpatient Remains inpatient appropriate because: Anasarca, nephrotic range proteinuria       Subjective:  Interval events noted.  He still has swelling of the lower extremities.  Objective:    Vitals:   05/31/24 2153 06/01/24 0500 06/01/24 0514 06/01/24 0754  BP: 138/66  130/66 130/61  Pulse: 77  91 84  Resp: 18  16 19   Temp: 97.7 F (36.5 C)  98.2 F (36.8 C) 98.2 F (36.8 C)  TempSrc:   Oral   SpO2: 99%  96% 97%  Weight:  92.1 kg    Height:       No data found.   Intake/Output Summary (Last 24 hours) at 06/01/2024 1357 Last data filed at 05/31/2024 2240 Gross  per 24 hour  Intake 483 ml  Output --  Net 483 ml   Filed Weights   05/29/24 0444 05/31/24 0945 06/01/24 0500  Weight: 95.8 kg 93 kg 92.1 kg    Exam:  GEN: NAD SKIN: Warm and dry EYES: No pallor or icterus ENT: MMM CV: RRR PULM: CTA B ABD: soft, ND, NT, +BS CNS: AAO x 3, non focal EXT: Edema of bilateral lower extremities GU: Scrotal swelling without erythema or tenderness      Data Reviewed:   I have personally reviewed following labs and imaging studies:  Labs: Labs show the following:   Basic Metabolic Panel: Recent Labs  Lab 05/26/24 0619 05/28/24 0521 05/30/24 0403 05/31/24 0457 06/01/24 0543  NA 132* 132* 129* 129* 131*  K 4.0 4.1 4.6  --  4.3  CL 96* 94* 91*  --  92*  CO2 27 29 29   --  31  GLUCOSE 85 126* 119*  --  110*  BUN 12 15 20   --  21  CREATININE 0.72 0.82 1.08 1.04 0.97  CALCIUM  8.2* 8.0* 8.5*  --  8.5*  MG  --   --  2.0  --  2.0  PHOS  --   --  4.8*  --  4.4   GFR Estimated Creatinine Clearance: 84.4 mL/min (by C-G formula based on SCr of 0.97 mg/dL). Liver Function Tests: Recent Labs  Lab 05/26/24 0619 05/30/24 0403 06/01/24 0543  ALBUMIN  2.9* 2.8* 3.1*   No results for input(s): LIPASE, AMYLASE in the last 168 hours. No results for input(s): AMMONIA in the last 168 hours. Coagulation profile Recent Labs  Lab 05/28/24 0521  INR 0.9    CBC: Recent Labs  Lab 05/26/24 0619 05/28/24 0521 05/30/24 0403 06/01/24 0543  WBC 4.8 4.4 3.7* 3.2*  HGB 8.0* 9.0* 8.9* 8.0*  HCT 24.7* 28.7* 28.2* 24.4*  MCV 109.3* 112.5* 109.7* 107.5*  PLT 309 343 364 333   Cardiac Enzymes: No results for input(s): CKTOTAL, CKMB, CKMBINDEX, TROPONINI in the last 168 hours. BNP (last 3 results) Recent Labs    05/20/24 1816  PROBNP 515.0*   CBG: Recent Labs  Lab 05/31/24 1143 05/31/24 1645 05/31/24 2151 06/01/24 0755 06/01/24 1155  GLUCAP 138* 98 106* 110* 103*   D-Dimer: No results for input(s): DDIMER in the last  72 hours. Hgb A1c: No results for input(s): HGBA1C in the last 72 hours. Lipid Profile: No results for input(s): CHOL, HDL, LDLCALC, TRIG, CHOLHDL, LDLDIRECT in the last 72 hours. Thyroid function studies: No results for input(s): TSH, T4TOTAL, T3FREE, THYROIDAB in the last 72 hours.  Invalid input(s): FREET3 Anemia work up: No results for input(s): VITAMINB12, FOLATE, FERRITIN, TIBC, IRON, RETICCTPCT in the last 72 hours. Sepsis Labs: Recent Labs  Lab 05/26/24 0619 05/28/24 0521 05/30/24 0403 06/01/24 0543  WBC 4.8 4.4 3.7* 3.2*  Microbiology No results found for this or any previous visit (from the past 240 hours).   Procedures and diagnostic studies:  No results found.              LOS: 10 days   Derek Garza  [facility]   Pager on [email or web address]. If 7PM-7AM, please contact night-coverage at [email or web address]     06/01/2024, 1:57 PM           "

## 2024-06-02 DIAGNOSIS — N049 Nephrotic syndrome with unspecified morphologic changes: Secondary | ICD-10-CM | POA: Diagnosis not present

## 2024-06-02 LAB — GLUCOSE, CAPILLARY
Glucose-Capillary: 123 mg/dL — ABNORMAL HIGH (ref 70–99)
Glucose-Capillary: 152 mg/dL — ABNORMAL HIGH (ref 70–99)
Glucose-Capillary: 111 mg/dL — ABNORMAL HIGH (ref 70–99)

## 2024-06-02 MED ORDER — SULFAMETHOXAZOLE-TRIMETHOPRIM 800-160 MG PO TABS: 1.0000 | ORAL_TABLET | ORAL | 0 refills | Status: AC

## 2024-06-02 MED ORDER — CARBAMAZEPINE ER 200 MG PO TB12: 400.0000 mg | ORAL_TABLET | Freq: Two times a day (BID) | ORAL | Status: DC

## 2024-06-02 MED ORDER — ACETAMINOPHEN 325 MG PO TABS: 650.0000 mg | ORAL_TABLET | Freq: Four times a day (QID) | ORAL | Status: AC | PRN

## 2024-06-02 MED ORDER — PREDNISONE 20 MG PO TABS: 60.0000 mg | ORAL_TABLET | Freq: Every day | ORAL | 0 refills | Status: AC

## 2024-06-02 NOTE — Telephone Encounter (Signed)
 Patient has been called and a message left for them to return the call to the office. Ok for E2C2 to review if/when they return the call. Please do not transfer to [facility] rather send a CRM if needed only.  Please review information by PCP with patient.

## 2024-06-02 NOTE — Progress Notes (Signed)
 " [facility]  ROUNDING NOTE   Subjective:   Patient sitting in chair Alert and oriented Edema continues to improve slowly   Daily weight 91.2 kg (92.1) (93.0) (95.8) (98.5)  Objective:  Vital signs in last 24 hours:  Temp:  [97.3 F (36.3 C)-98.4 F (36.9 C)] 97.9 F (36.6 C) (01/05 0818) Pulse Rate:  [79-89] 86 (01/05 0818) Resp:  [16-20] 16 (01/05 0818) BP: (122-148)/(63-82) 148/63 (01/05 0818) SpO2:  [95 %-97 %] 97 % (01/05 0818) Weight:  [91.2 kg] 91.2 kg (01/05 0500)  Weight change: -1.787 kg Filed Weights   05/31/24 0945 06/01/24 0500 06/02/24 0500  Weight: 93 kg 92.1 kg 91.2 kg    Intake/Output: I/O last 3 completed shifts: In: 483 [P.O.:480; I.V.:3] Out: -    Intake/Output this shift:  No intake/output data recorded.  Physical Exam: General: NAD  Head: Normocephalic  Eyes: Anicteric  Lungs:  Clear, on room air  Heart: Regular rate  Abdomen:  Soft, nontender  Extremities:  ++ peripheral edema  Neurologic: Alert, conversant  Skin: No lesions  GU scrotal edema  Access: none    Basic Metabolic Panel: Recent Labs  Lab 05/28/24 0521 05/30/24 0403 05/31/24 0457 06/01/24 0543  NA 132* 129* 129* 131*  K 4.1 4.6  --  4.3  CL 94* 91*  --  92*  CO2 29 29  --  31  GLUCOSE 126* 119*  --  110*  BUN 15 20  --  21  CREATININE 0.82 1.08 1.04 0.97  CALCIUM  8.0* 8.5*  --  8.5*  MG  --  2.0  --  2.0  PHOS  --  4.8*  --  4.4    Liver Function Tests: Recent Labs  Lab 05/30/24 0403 06/01/24 0543  ALBUMIN  2.8* 3.1*   No results for input(s): LIPASE, AMYLASE in the last 168 hours. No results for input(s): AMMONIA in the last 168 hours.  CBC: Recent Labs  Lab 05/28/24 0521 05/30/24 0403 06/01/24 0543  WBC 4.4 3.7* 3.2*  HGB 9.0* 8.9* 8.0*  HCT 28.7* 28.2* 24.4*  MCV 112.5* 109.7* 107.5*  PLT 343 364 333    Cardiac Enzymes: No results for input(s): CKTOTAL, CKMB, CKMBINDEX, TROPONINI in the last 168  hours.  BNP: Invalid input(s): POCBNP  CBG: Recent Labs  Lab 06/01/24 0755 06/01/24 1155 06/01/24 1629 06/01/24 2035 06/02/24 0815  GLUCAP 110* 103* 123* 130* 123*    Microbiology: Results for orders placed or performed in visit on 05/19/24  Microscopic Examination     Status: None   Collection Time: 05/19/24 11:35 AM   Urine  Result Value Ref Range Status   WBC, UA None seen 0 - 5 /hpf Final   RBC, Urine 0-2 0 - 2 /hpf Final   Epithelial Cells (non renal) None seen 0 - 10 /hpf Final   Bacteria, UA None seen None seen/Few Final  Urine Culture     Status: None   Collection Time: 05/19/24  1:19 PM   Specimen: Urine   UR  Result Value Ref Range Status   Urine Culture, Routine Final report  Final   Organism ID, Bacteria Comment  Final    Comment: Mixed urogenital flora Less than 10,000 colonies/mL     Coagulation Studies: No results for input(s): LABPROT, INR in the last 72 hours.    Urinalysis: No results for input(s): COLORURINE, LABSPEC, PHURINE, GLUCOSEU, HGBUR, BILIRUBINUR, KETONESUR, PROTEINUR, UROBILINOGEN, NITRITE, LEUKOCYTESUR in the last 72 hours.  Invalid input(s): APPERANCEUR  Imaging: No results found.     Medications:       atorvastatin   10 mg Oral Daily   carbamazepine   400 mg Oral BID   furosemide   40 mg Intravenous TID   gabapentin   600 mg Oral BID   insulin  aspart  0-15 Units Subcutaneous TID WC   insulin  aspart  0-5 Units Subcutaneous QHS   QUEtiapine   800 mg Oral QHS   sodium chloride  flush  3 mL Intravenous Q12H   spironolactone   25 mg Oral Daily   acetaminophen  **OR** acetaminophen , ondansetron  **OR** ondansetron  (ZOFRAN ) IV, oxyCODONE , senna-docusate  Assessment/ Plan:  Mr. Lewi Drost is a 64 y.o.  male with hypertension, hyperlipidemia, diabetes mellitus type II, and BPH, who was admitted to [facility] on 05/20/2024 for Anasarca [R60.1] Scrotal swelling [N50.89] Anemia, unspecified type  [D64.9] Leukopenia, unspecified type [D72.819]   Anasarca with nephrotic range proteinuria.  Renal biopsy completed on 12/31, awaiting preliminary results.  Edema is improving with prescribed IV furosemide .  Continue IV Furosemide  40mg  TID and spironolactone . Metolazone  2.5mg  given on 1/1 and 1/4. Continue daily weights. Expect prelim renal biopsy results this afternoon.   Hypertension, essential  Currently off all antihypertensives. Blood pressure 148/63, stable    Diabetes mellitus type II with renal manifestations  Glucose well-controlled by primary team.  Hypoalbuminemia  Albumin  3.1. Has received IV albumin  during this admission.  5. Anemia, Macrocytic Hgb decreased, can consider ESA Lab Results  Component Value Date   HGB 8.0 (L) 06/01/2024      Differential includes acute glomerulopathy, myeloplastic disease, progressive diabetic nephropathy, hepatic cirrhosis or congestive heart failure.  - serologic work up negative.  - appreciate hematology input. Bone marrow aspiration completed - Renal biopsy completed on 12/31   LOS: 11 Smith Mcnicholas 1/5/202610:03 AM   "

## 2024-06-02 NOTE — Discharge Summary (Signed)
 " Physician Discharge Summary   Patient: Derek Garza MRN: [id number] DOB: September 26, 1960  Admit date:     05/20/2024  Discharge date: 06/02/2024  Discharge Physician: AIDA CHO   PCP: Valerio Melanie [name], NP   Recommendations at discharge:   Follow-up with PCP in 1 week Follow-up with Dr. Dennise, nephrologist, within 1 to 2 weeks of discharge Schedule an appointment with [facility] within 2 weeks of discharge  Discharge Diagnoses: Principal Problem:   Nephrotic syndrome Active Problems:   Anasarca   DM type 2 with diabetic peripheral neuropathy (HCC)   Hypertension associated with diabetes (HCC)   Hypogonadism in male   Bipolar 2 disorder (HCC)   Hyperlipidemia associated with type 2 diabetes mellitus (HCC)   Benign prostatic hyperplasia (BPH) with straining on urination  Resolved Problems:   * No resolved hospital problems. *  Hospital Course:  Samrat Hayward is a 64 y.o. male with medical history of HTN, HLD, DMII, BPH, who presented to the hospital with fatigue, general body swelling including abdomen, lower extremities and scrotum.  He also reported progressive weight gain, decreased urination and general weakness.     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  Pathology report showed minimal-change disease. per Dr. Marcelino, nephrologist.  He recommended that patient be discharged on prednisone  60 mg daily, Bactrim  DS 1 tab 3 times a week and Lasix  40 mg twice daily. Pathology report and discharge medications were discussed with the patient.  He prefers to take Lasix  80 mg daily as he does at home.   Scrotal swelling, right hydrocele: Diffuse scrotal swelling may be related to hypoalbuminemia.  Outpatient follow-up recommended for evaluation of right hydrocele.     Hyponatremia: Sodium level is stable at 131.      Leukopenia, macrocytic anemia: Leukopenia has resolved. S/p  left iliac bone marrow biopsy on 05/23/2024.  No evidence of acute leukemia.  Immature granulocytes/promyelocytes, immature erythrocyte series noted suggestive of regenerative bone marrow from suspected bone marrow insult per hematologist.  Differential diagnoses include toxicity from Tegretol /Seroquel   enhanced toxicity from hypoalbuminemia. Of note, patient does not want to discontinue psychotropics. Appreciate input from hematologist.     Bipolar disorder: Patient is on Tegretol  and Seroquel .  He does not want to discontinue these medicines.  He prefers to follow-up with his oupatient psychiatrist. Appreciate input from psychiatrist.     Comorbidities include type II DM, hypertension, hyperlipidemia, BPH, peripheral neuropathy     Plan of care was discussed with Dr. Marcelino and Faith, NP, with nephrology team via secure chat.  From nephrologist's standpoint, patient is okay for discharge.  Patient is eager to be discharged today.  He is deemed stable for discharge.            Consultants: Nephrologist, hematologist Procedures performed: Bone marrow biopsy, kidney biopsy Disposition: Home Diet recommendation:  Cardiac diet DISCHARGE MEDICATION: Allergies as of 06/02/2024   No Known Allergies      Medication List     STOP taking these medications    cyclobenzaprine 10 MG tablet Commonly known as: FLEXERIL   tamsulosin  0.4 MG Caps capsule Commonly known as: FLOMAX    traMADol  50 MG tablet Commonly known as: ULTRAM        TAKE these medications    acetaminophen  325 MG tablet Commonly known as: TYLENOL  Take 2 tablets (650 mg total) by mouth every 6 (six) hours as needed for mild pain (pain score 1-3) or  fever (or Fever >/= 101).   atorvastatin  10 MG tablet Commonly known as: LIPITOR Take 1 tablet (10 mg total) by mouth daily.   carbamazepine  200 MG 12 hr tablet Commonly known as: TEGRETOL  XR Take 2 tablets (400 mg total) by mouth 2 (two) times daily.    empagliflozin  25 MG Tabs tablet Commonly known as: Jardiance  Take 1 tablet (25 mg total) by mouth daily.   furosemide  80 MG tablet Commonly known as: LASIX  Take 1 tablet (80 mg total) by mouth daily.   gabapentin  600 MG tablet Commonly known as: NEURONTIN  Take 1 tablet (600 mg total) by mouth 2 (two) times daily.   potassium chloride  10 MEQ tablet Commonly known as: KLOR-CON  Take 1 tablet (10 mEq total) by mouth daily.   predniSONE  20 MG tablet Commonly known as: DELTASONE  Take 3 tablets (60 mg total) by mouth daily with breakfast. Start taking on: June 03, 2024   QUEtiapine  400 MG tablet Commonly known as: SEROQUEL  Take 800 mg by mouth daily.   sulfamethoxazole -trimethoprim  800-160 MG tablet Commonly known as: Bactrim  DS Take 1 tablet by mouth 3 (three) times a week.        Follow-up Information     Dennise Capri, MD. Schedule an appointment as soon as possible for a visit in 1 week(s).   Specialty: Nephrology Contact information: [address] [place] [address] [phone]         Francisca Redell BROCKS, MD. Schedule an appointment as soon as possible for a visit in 2 week(s).   Specialty: Urology Why: hydrocele Contact information: [address] [place] [address] [phone]                Discharge Exam: Fredricka Weights   05/31/24 0945 06/01/24 0500 06/02/24 0500  Weight: 93 kg 92.1 kg 91.2 kg   GEN: NAD SKIN: Warm and dry EYES: No pallor or icterus ENT: MMM CV: RRR PULM: CTA B ABD: soft, abdominal distention is better, NT, +BS CNS: AAO x 3, non focal EXT: Bilateral lower extremity edema without erythema or tenderness GU: Scrotal edema without erythema or tenderness  Condition at discharge: good  The results of significant diagnostics from this hospitalization (including imaging, microbiology, ancillary and laboratory) are listed below for reference.   Imaging Studies: US  BIOPSY (KIDNEY) Result Date:  05/28/2024 INDICATION: Nephrotic range proteinuria EXAM: Ultrasound-guided biopsy MEDICATIONS: None. ANESTHESIA/SEDATION: Moderate (conscious) sedation was employed during this procedure. A total of Versed  1 mg and Fentanyl  50 mcg was administered intravenously by the radiology nurse. Total intra-service moderate Sedation Time: 16 minutes. The patient's level of consciousness and vital signs were monitored continuously by radiology nursing throughout the procedure under my direct supervision. COMPLICATIONS: None immediate. PROCEDURE: Informed written consent was obtained from the patient after a thorough discussion of the procedural risks, benefits and alternatives. All questions were addressed. Maximal Sterile Barrier Technique was utilized including caps, mask, sterile gowns, sterile gloves, sterile drape, hand hygiene and skin antiseptic. A timeout was performed prior to the initiation of the procedure. In a prone position both flank regions were evaluated for percutaneous access. The left kidney was better visualized and chosen for the target of the renal biopsy. The left flank region was then prepped and draped in usual sterile fashion. Local anesthesia was achieved with 1% lidocaine . Small incision was made in the left flank region. A 17 gauge access needle was then advanced through the incision under ultrasound guidance to the lower pole the left kidney. The  needle was advanced into the renal parenchyma. The stylet was removed. An 18 gauge BioPince needle was then advanced under ultrasound guidance through the introducer needle and 2 core needle biopsies were obtained in sequential fashion. A small amount of Gel-Foam was then injected through the access needle and the needle was removed. Final image of the left kidney demonstrates no acute hemorrhage. Sample sent to laboratory for further evaluation and processing. IMPRESSION: Satisfactory core needle biopsy of the lower pole of the left kidney.  Electronically Signed   By: Cordella Banner   On: 05/28/2024 14:40   IR BONE MARROW BIOPSY & ASPIRATION Result Date: 05/23/2024 EXAM: Fluoroscopic guided bone marrow biopsy COMPARISON: None. CLINICAL HISTORY: Indication / Diagnosis: Anemia, unspecified type, Leukopenia, unspecified type, and Severe neutropenia. Additional clinical history: Anemia, unspecified type, Leukopenia, unspecified type, and Severe neutropenia. Previous biopsy of same target: No. Complications: No immediate complications. Conscious Sedation: 1 mg Versed  IV, 50 mcg fentanyl  IV. Sedation Technique: Under physician supervision, Versed  and fentanyl  were administered IV for moderate sedation. Pulse oximetry, heart rate, and blood pressure were continuously monitored with an independent trained observer present. Physician face-to-face sedation time: 10 minutes. Discussion: Time out performed including verification of patient, date of birth, procedure, and target as appropriate. Informed written consent was obtained. The site was prepared and draped using maximal sterile barrier technique including cutaneous antisepsis. The patient was positioned prone. Initial imaging was performed. Biopsy target: Organ or target location: Left iliac wing. Laterality: Left. Local anesthesia was administered. Under image guidance, the biopsy needle was advanced to the target and biopsy was performed. Biopsy details: Guidance modality: Fluoroscopy. Biopsy type: Core / Aspiration. Coaxial needle: Not specified. Core needle size: Not specified. Number of core specimens: Not specified. Aspiration performed: Not specified. On-site assessment of biopsy adequacy: Not specified. Preliminary assessment of sample adequacy: Not specified. The biopsy needle was removed and a sterile dressing was applied. Tract closure: None. Post-procedure imaging: Immediate post-biopsy imaging: Not performed. Estimated blood loss: Less than 10 mL. Specimens sent for evaluation.  IMPRESSION: 1. Technically successful fluoroscopy-guided bone marrow biopsy of the left iliac wing. 2. No immediate complications. Electronically signed by: Katheleen Faes MD 05/23/2024 11:27 AM EST RP Workstation: [id number]   MR PELVIS W WO CONTRAST Result Date: 05/22/2024 CLINICAL DATA:  Atrophic left testicle with intravesicular lesions seen on recent ultrasound. EXAM: MRI PELVIS WITHOUT AND WITH CONTRAST TECHNIQUE: Multiplanar multisequence MR imaging of the pelvis was performed both before and after administration of intravenous contrast. CONTRAST:  10mL GADAVIST  GADOBUTROL  1 MMOL/ML IV SOLN COMPARISON:  Ultrasound exam 05/20/2024. FINDINGS: Urinary Tract:  No abnormality visualized. Bowel:  Unremarkable visualized pelvic bowel loops. Vascular/Lymphatic: No pathologically enlarged lymph nodes. No significant vascular abnormality seen. Reproductive: Prostate gland is heterogeneous but incompletely evaluated on this study. Right testicle measures 3.6 x 4.7 x 3.4 cm and is unremarkable in appearance. Right epididymal tissues are unremarkable. Moderate to large right hydrocele evident. Left testicle measures 2.7 x 2.8 x 1.8 cm and is heterogeneous in signal intensity. Circumferential capsular thickening evident. Postcontrast imaging shows irregular capsular enhancement with no overtly suspicious enhancement characteristics. No discrete intratesticular mass lesion evident. No hydrocele. Other:  Right groin hernia contains only fat. Musculoskeletal: No suspicious marrow signal abnormality. Diffuse body wall edema evident in the lower pelvis and upper thighs. IMPRESSION: 1. Left testicle is small and heterogeneous in signal intensity with circumferential capsular thickening and irregular capsular enhancement. No discrete suspicious intratesticular mass lesion evident by MRI. Imaging features are nonspecific  but could be related to prior trauma or infection. Neoplasm considered less likely. Given the finding of  concern is evident on sonography, follow-up scrotal ultrasound in 3 months recommended to ensure stability. 2. Moderate to large right hydrocele. 3. Right groin hernia contains only fat. 4. Diffuse body wall edema in the lower pelvis and upper thighs. Electronically Signed   By: Camellia Candle M.D.   On: 05/22/2024 07:02   ECHOCARDIOGRAM COMPLETE Result Date: 05/21/2024    ECHOCARDIOGRAM REPORT   Patient Name:   WARWICK NICK Date of Exam: 05/21/2024 Medical Rec #:  [id number]      Height:       72.0 in Accession #:    [id number]     Weight:       219.8 lb Date of Birth:  03/30/1961      BSA:          2.218 m Patient Age:    63 years       BP:           131/72 mmHg Patient Gender: M              HR:           106 bpm. Exam Location:  [facility] Procedure: 2D Echo, Cardiac Doppler, Color Doppler and Strain Analysis (Both            Spectral and Color Flow Doppler were utilized during procedure). Indications:     Anasarca  History:         Patient has no prior history of Echocardiogram examinations.                  Risk Factors:Diabetes, Hypertension and Dyslipidemia.  Sonographer:     Christopher Furnace Referring Phys:  [id number] [facility] Diagnosing Phys: Caron Poser  Sonographer Comments: Global longitudinal strain was attempted. IMPRESSIONS  1. Very technically difficult study.  2. Left ventricular ejection fraction, by estimation, is 55 to 60%. The left ventricle has normal function. Left ventricular endocardial border not optimally defined to evaluate regional wall motion. Left ventricular diastolic parameters were normal.  3. Right ventricular systolic function is normal. The right ventricular size is normal.  4. The mitral valve is normal in structure. Trivial mitral valve regurgitation. No evidence of mitral stenosis.  5. The aortic valve is tricuspid. Aortic valve regurgitation is not visualized. No aortic stenosis is present.  6. The inferior vena cava is normal in size with greater than 50% respiratory  variability, suggesting right atrial pressure of 3 mmHg. Comparison(s): No prior Echocardiogram. Conclusion(s)/Recommendation(s): Normal biventricular function without evidence of hemodynamically significant valvular heart disease. FINDINGS  Left Ventricle: Left ventricular ejection fraction, by estimation, is 55 to 60%. The left ventricle has normal function. Left ventricular endocardial border not optimally defined to evaluate regional wall motion. Global longitudinal strain performed but  not reported based on interpreter judgement due to suboptimal tracking. The left ventricular internal cavity size was normal in size. There is no left ventricular hypertrophy. Left ventricular diastolic parameters were normal. Right Ventricle: The right ventricular size is normal. Right vetricular wall thickness was not well visualized. Right ventricular systolic function is normal. Left Atrium: Left atrial size was normal in size. Right Atrium: Right atrial size was normal in size. Pericardium: Trivial pericardial effusion is present. Mitral Valve: The mitral valve is normal in structure. Trivial mitral valve regurgitation. No evidence of mitral valve stenosis. Tricuspid Valve: The tricuspid valve is normal in structure. Tricuspid valve regurgitation is trivial.  No evidence of tricuspid stenosis. Aortic Valve: The aortic valve is tricuspid. Aortic valve regurgitation is not visualized. No aortic stenosis is present. Aortic valve mean gradient measures 3.0 mmHg. Aortic valve peak gradient measures 4.8 mmHg. Aortic valve area, by VTI measures 4.28 cm. Pulmonic Valve: The pulmonic valve was not well visualized. Pulmonic valve regurgitation is trivial. No evidence of pulmonic stenosis. Aorta: The aortic root is normal in size and structure. Venous: The inferior vena cava is normal in size with greater than 50% respiratory variability, suggesting right atrial pressure of 3 mmHg. IAS/Shunts: The interatrial septum was not well  visualized.  LEFT VENTRICLE PLAX 2D LVIDd:         4.90 cm   Diastology LVIDs:         3.10 cm   LV e' medial:    7.51 cm/s LV PW:         1.20 cm   LV E/e' medial:  14.6 LV IVS:        0.70 cm   LV e' lateral:   10.80 cm/s LVOT diam:     2.30 cm   LV E/e' lateral: 10.2 LV SV:         90 LV SV Index:   41 LVOT Area:     4.15 cm LV IVRT:       87 msec  RIGHT VENTRICLE RV Basal diam:  3.00 cm     PULMONARY VEINS RV Mid diam:    2.90 cm     Diastolic Velocity: 49.30 cm/s RV S prime:     13.60 cm/s  S/D Velocity:       1.50 TAPSE (M-mode): 2.3 cm      Systolic Velocity:  73.40 cm/s LEFT ATRIUM             Index        RIGHT ATRIUM           Index LA diam:        4.20 cm 1.89 cm/m   RA Area:     14.80 cm LA Vol (A2C):   50.3 ml 22.68 ml/m  RA Volume:   36.30 ml  16.37 ml/m LA Vol (A4C):   50.1 ml 22.59 ml/m LA Biplane Vol: 52.7 ml 23.76 ml/m  AORTIC VALVE AV Area (Vmax):    3.47 cm AV Area (Vmean):   3.56 cm AV Area (VTI):     4.28 cm AV Vmax:           110.00 cm/s AV Vmean:          78.050 cm/s AV VTI:            0.211 m AV Peak Grad:      4.8 mmHg AV Mean Grad:      3.0 mmHg LVOT Vmax:         91.80 cm/s LVOT Vmean:        66.800 cm/s LVOT VTI:          0.217 m LVOT/AV VTI ratio: 1.03  AORTA Ao Root diam: 3.40 cm MITRAL VALVE                TRICUSPID VALVE MV Area (PHT): 6.07 cm     TR Peak grad:   12.4 mmHg MV Decel Time: 125 msec     TR Vmax:        176.00 cm/s MV E velocity: 110.00 cm/s MV A velocity: 95.40 cm/s   SHUNTS MV E/A ratio:  1.15  Systemic VTI:  0.22 m                             Systemic Diam: 2.30 cm Caron Poser Electronically signed by Caron Poser Signature Date/Time: 05/21/2024/3:16:02 PM    Final    CT CHEST ABDOMEN PELVIS W CONTRAST Result Date: 05/20/2024 CLINICAL DATA:  Metastatic disease evaluation. EXAM: CT CHEST, ABDOMEN, AND PELVIS WITH CONTRAST TECHNIQUE: Multidetector CT imaging of the chest, abdomen and pelvis was performed following the standard protocol during  bolus administration of intravenous contrast. RADIATION DOSE REDUCTION: This exam was performed according to the departmental dose-optimization program which includes automated exposure control, adjustment of the mA and/or kV according to patient size and/or use of iterative reconstruction technique. CONTRAST:  OMNIPAQUE  IOHEXOL  300 MG/ML  SOLN COMPARISON:  CT abdomen pelvis dated 03/17/2023. FINDINGS: CT CHEST FINDINGS Cardiovascular: There is no cardiomegaly. Small pericardial effusion. Three vessel coronary vascular calcification. Mild atherosclerotic calcification of the thoracic aorta. No aneurysmal dilatation or dissection. The origins of the great vessels of the aortic arch and the central pulmonary arteries are patent. Mediastinum/Nodes: No hilar or mediastinal adenopathy. The esophagus is grossly unremarkable no mediastinal fluid collection. Lungs/Pleura: Small left and trace right pleural effusions. There is partial compressive atelectasis of the left lower lobe. Pneumonia is not excluded. No pneumothorax. The central airways are patent. Musculoskeletal: Degenerative changes of the spine. No acute osseous pathology. 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 CT ABDOMEN PELVIS FINDINGS No intra-abdominal free air.  Small ascites. Hepatobiliary: The liver is unremarkable. No biliary dilatation. Cholecystectomy. Pancreas: Moderately atrophic pancreas. No active inflammatory changes. Spleen: Normal in size without focal abnormality. Adrenals/Urinary Tract: The adrenal glands unremarkable. Punctate nonobstructing left renal pole calculus. There is no hydronephrosis on either side. There is symmetric enhancement and excretion of contrast by both kidneys. 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 There is diffuse colonic thickening which may be related to underdistention but concerning for colitis. Clinical correlation  recommended. There is no bowel obstruction. Appendectomy. Vascular/Lymphatic: Moderate aortoiliac atherosclerotic disease. The IVC is unremarkable. No portal venous gas. There is no adenopathy. Reproductive: The prostate gland is grossly unremarkable. Other: Small fat containing bilateral inguinal hernias. Partially visualized bilateral hydroceles, right greater than left. The left testicle appears atrophic. See report for testicular ultrasound. Musculoskeletal: Osteopenia with degenerative changes of the spine. No acute osseous pathology. There is diffuse subcutaneous edema and anasarca. No fluid collection. IMPRESSION: 1. No evidence of metastatic disease in the chest, abdomen, or pelvis. 2. Small left and trace right pleural effusions with partial compressive atelectasis of the left lower lobe. Pneumonia is not excluded. 3. Small ascites and anasarca. 4. Possible colitis.  No bowel obstruction. 5. Punctate nonobstructing left renal calculus. No hydronephrosis. 6.  Aortic Atherosclerosis (ICD10-I70.0). Electronically Signed   By: Vanetta Chou M.D.   On: 05/20/2024 21:05   US  SCROTUM W/DOPPLER Result Date: 05/20/2024 EXAM: ULTRASOUND SCROTUM/TESTICLES WITH DOPPLER FLOW EVALUATION 05/20/2024 02:43:54 PM TECHNIQUE: Duplex ultrasound using B-mode/gray scaled imaging, Doppler spectral analysis and color flow Doppler was obtained of the testicles. COMPARISON: US  Scrotum 03/17/2023. CLINICAL HISTORY: Swelling of right testicle. FINDINGS: RIGHT: GREY SCALE: The right testicle measures 4.8 x 3.5 x 3.4 cm. It demonstrates normal homogeneous echotexture without focal lesion. No testicular microlithiasis. DOPPLER EVALUATION: There is normal arterial and venous Doppler flow within the testicle. VARICOCELE: No scrotal varicocele. SCROTAL  SAC: Interval development of a large right hydrocele, containing echogenic debris. EPIDIDYMIS: No acute abnormality. LEFT: GREY SCALE: The left testicle measures 3.3 x 1.7 x 1.9 cm.  There is chronic heterogeneity of the atrophic left testicle. On today's exam, there is a more focal hypoechogenic lesion containing punctate calcification, measuring 2 x 1.2 x 1.8 cm. No testicular microlithiasis. DOPPLER EVALUATION: There is normal arterial and venous Doppler flow within the testicle. VARICOCELE: No scrotal varicocele. SCROTAL SAC: No hydrocele. EPIDIDYMIS: No acute abnormality. INGUINAL REGIONS: Fat containing bilateral inguinal hernias, larger on the right than the left. IMPRESSION: 1. Interval development of a large right hydrocele containing echogenic debris. 2. Atrophic left testicle with a more focal hypoechogenic lesion on today's exam containing punctate calcification, measuring 2 x 1.2 x 1.8 cm. While this could represent more focal atrophy in the surrounding testicular parenchyma, a nonemergent pelvic MRI with iv contrast, should be considered to exclude a developing neoplasm. Laboratory correlation also recommended. . Electronically signed by: Rogelia Myers MD 05/20/2024 04:09 PM EST RP Workstation: [id number]    Microbiology: Results for orders placed or performed in visit on 05/19/24  Microscopic Examination     Status: None   Collection Time: 05/19/24 11:35 AM   Urine  Result Value Ref Range Status   WBC, UA None seen 0 - 5 /hpf Final   RBC, Urine 0-2 0 - 2 /hpf Final   Epithelial Cells (non renal) None seen 0 - 10 /hpf Final   Bacteria, UA None seen None seen/Few Final  Urine Culture     Status: None   Collection Time: 05/19/24  1:19 PM   Specimen: Urine   UR  Result Value Ref Range Status   Urine Culture, Routine Final report  Final   Organism ID, Bacteria Comment  Final    Comment: Mixed urogenital flora Less than 10,000 colonies/mL     Labs: CBC: Recent Labs  Lab 05/28/24 0521 05/30/24 0403 06/01/24 0543  WBC 4.4 3.7* 3.2*  HGB 9.0* 8.9* 8.0*  HCT 28.7* 28.2* 24.4*  MCV 112.5* 109.7* 107.5*  PLT 343 364 333   Basic Metabolic Panel: Recent  Labs  Lab 05/28/24 0521 05/30/24 0403 05/31/24 0457 06/01/24 0543  NA 132* 129* 129* 131*  K 4.1 4.6  --  4.3  CL 94* 91*  --  92*  CO2 29 29  --  31  GLUCOSE 126* 119*  --  110*  BUN 15 20  --  21  CREATININE 0.82 1.08 1.04 0.97  CALCIUM  8.0* 8.5*  --  8.5*  MG  --  2.0  --  2.0  PHOS  --  4.8*  --  4.4   Liver Function Tests: Recent Labs  Lab 05/30/24 0403 06/01/24 0543  ALBUMIN  2.8* 3.1*   CBG: Recent Labs  Lab 06/01/24 1629 06/01/24 2035 06/02/24 0815 06/02/24 1203 06/02/24 1729  GLUCAP 123* 130* 123* 152* 111*    Discharge time spent: greater than 30 minutes.  Signed: AIDA CHO, MD [facility] 06/02/2024 "

## 2024-06-03 ENCOUNTER — Encounter: Payer: Self-pay | Admitting: Nephrology

## 2024-06-03 LAB — MISC LABCORP TEST (SEND OUT): Labcorp test code: 9985

## 2024-06-04 ENCOUNTER — Encounter: Payer: Self-pay | Admitting: Internal Medicine

## 2024-06-04 NOTE — Progress Notes (Signed)
"  Discussed with PCP- recommend follow-up with PCP/psychiatry regarding psychiatric medication adjustments patient will follow-up with us  only as needed. "

## 2024-06-07 NOTE — Patient Instructions (Incomplete)

## 2024-06-11 ENCOUNTER — Inpatient Hospital Stay: Admitting: Nurse Practitioner

## 2024-06-12 ENCOUNTER — Other Ambulatory Visit: Payer: Self-pay | Admitting: Nurse Practitioner

## 2024-06-14 DIAGNOSIS — K4090 Unilateral inguinal hernia, without obstruction or gangrene, not specified as recurrent: Secondary | ICD-10-CM | POA: Insufficient documentation

## 2024-06-14 DIAGNOSIS — N433 Hydrocele, unspecified: Secondary | ICD-10-CM | POA: Insufficient documentation

## 2024-06-14 NOTE — Patient Instructions (Signed)

## 2024-06-16 ENCOUNTER — Ambulatory Visit: Admitting: Nurse Practitioner

## 2024-06-16 ENCOUNTER — Encounter: Payer: Self-pay | Admitting: Nurse Practitioner

## 2024-06-16 VITALS — BP 101/59 | HR 84 | Temp 97.4°F | Resp 17 | Ht 72.01 in | Wt 208.0 lb

## 2024-06-16 DIAGNOSIS — N433 Hydrocele, unspecified: Secondary | ICD-10-CM

## 2024-06-16 DIAGNOSIS — R601 Generalized edema: Secondary | ICD-10-CM

## 2024-06-16 DIAGNOSIS — F3181 Bipolar II disorder: Secondary | ICD-10-CM

## 2024-06-16 DIAGNOSIS — N049 Nephrotic syndrome with unspecified morphologic changes: Secondary | ICD-10-CM | POA: Diagnosis not present

## 2024-06-16 NOTE — Assessment & Plan Note (Signed)
 Chronic, ongoing.  Denies SI/HI.  At this time continue collaboration with psychiatry and recommend he follow-up with them sooner than scheduled since he has stopped Tegretol  and is taking Seroquel  only per hospital recommendations. Will attempt to get their records.

## 2024-06-16 NOTE — Assessment & Plan Note (Signed)
 Acute and is beginning to improve. He reports having stopped Tegretol , but does continue Seroquel . He will attend follow-ups with specialists as ordered. CBC and CMP today. Have recommended he schedule a sooner follow-up with his psychiatrist to discuss alternate treatments to help prevent worsening Bipolar. He had been on Tegretol  and Seroquel  for many years.

## 2024-06-16 NOTE — Assessment & Plan Note (Signed)
 Acute and is beginning to improve. He reports having stopped Tegretol , but does continue Seroquel . He will attend follow-ups with specialists as ordered. CBC and CMP today.

## 2024-06-16 NOTE — Progress Notes (Signed)
 "  BP (!) 101/59 (BP Location: Left Arm, Patient Position: Sitting, Cuff Size: Normal)   Pulse 84   Temp (!) 97.4 F (36.3 C) (Oral)   Resp 17   Ht 6' 0.01 (1.829 m)   Wt 208 lb (94.3 kg)   SpO2 95%   BMI 28.20 kg/m    Subjective:    Patient ID: Derek Garza, male    DOB: September 23, 1960, 64 y.o.   MRN: [id number]  HPI: Derek Garza is a 64 y.o. male  Chief Complaint  Patient presents with   Follow-up    Here for hospital follow up    Transition of Care Hospital Follow up.  Admitted from December 23rd to January 5th for anasarca or abnormal labs. Diagnosed with nephrotic syndrome which is suspected to be related to Tegretol  and Seroquel , but he refused to change these. In hospital was seen by both nephrology, urology, and oncology. Bone marrow biopsy performed and this was overall reassuring. He reports since leaving hospital he has stopped Tegretol  XR and is taking Seroquel .  Follows with psychiatry, Dr. Theodoro with [facility] in [place]. He plans to schedule follow-up with them. He reports his swelling is improving, dramatically improved and swelling is getting better. Was started on Tamsulosin  in hospital, he does endorse some BPH symptoms.  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  Pathology report showed minimal-change disease. per Dr. Marcelino, nephrologist.  He recommended that patient be discharged on prednisone  60 mg daily, Bactrim  DS 1 tab 3 times a week and Lasix  40 mg twice daily. Pathology report and discharge medications were discussed with the patient.  He prefers to take Lasix  80 mg daily as he does at home.   Scrotal swelling, right hydrocele: Diffuse scrotal swelling may be related to hypoalbuminemia.  Outpatient follow-up recommended for evaluation of right hydrocele.   Hyponatremia: Sodium level is stable at 131.    Leukopenia, macrocytic anemia: Leukopenia has resolved. S/p left  iliac bone marrow biopsy on 05/23/2024.  No evidence of acute leukemia.  Immature granulocytes/promyelocytes, immature erythrocyte series noted suggestive of regenerative bone marrow from suspected bone marrow insult per hematologist.  Differential diagnoses include toxicity from Tegretol /Seroquel   enhanced toxicity from hypoalbuminemia. Of note, patient does not want to discontinue psychotropics. Appreciate input from hematologist.   Bipolar disorder: Patient is on Tegretol  and Seroquel .  He does not want to discontinue these medicines.  He prefers to follow-up with his oupatient psychiatrist. Appreciate input from psychiatrist.   Comorbidities include type II DM, hypertension, hyperlipidemia, BPH, peripheral neuropathy     Plan of care was discussed with Dr. Marcelino and Faith, NP, with nephrology team via secure chat.  From nephrologist's standpoint, patient is okay for discharge.  Patient is eager to be discharged today.  He is deemed stable for discharge.  Hospital/Facility: [facility] D/C Physician: Dr. Jens D/C Date: 06/02/24  Records Requested: 06/16/24 Records Received: 06/16/24 Records Reviewed: 06/16/24  Diagnoses on Discharge: Nephrotic Syndrome  Date of interactive Contact within 48 hours of discharge:  Contact was through: none  Date of 7 day or 14 day face-to-face visit:    within 14 days  Outpatient Encounter Medications as of 06/16/2024  Medication Sig   acetaminophen  (TYLENOL ) 325 MG tablet Take 2 tablets (650 mg total) by mouth every 6 (six) hours as needed for mild pain (pain score 1-3) or fever (or Fever >/= 101).   atorvastatin  (LIPITOR) 10 MG tablet TAKE ONE  TABLET BY MOUTH ONE TIME DAILY   empagliflozin  (JARDIANCE ) 25 MG TABS tablet Take 1 tablet (25 mg total) by mouth daily.   furosemide  (LASIX ) 80 MG tablet Take 1 tablet (80 mg total) by mouth daily.   gabapentin  (NEURONTIN ) 600 MG tablet Take 1 tablet (600 mg total) by mouth 2 (two) times daily.   potassium  chloride (KLOR-CON ) 10 MEQ tablet Take 1 tablet (10 mEq total) by mouth daily.   predniSONE  (DELTASONE ) 20 MG tablet Take 3 tablets (60 mg total) by mouth daily with breakfast.   QUEtiapine  (SEROQUEL ) 400 MG tablet Take 800 mg by mouth daily.   sulfamethoxazole -trimethoprim  (BACTRIM  DS) 800-160 MG tablet Take 1 tablet by mouth 3 (three) times a week.   carbamazepine  (TEGRETOL  XR) 200 MG 12 hr tablet Take 2 tablets (400 mg total) by mouth 2 (two) times daily. (Patient not taking: Reported on 06/16/2024)   No facility-administered encounter medications on file as of 06/16/2024.   Diagnostic Tests Reviewed/Disposition:     Latest Ref Rng & Units 06/01/2024    5:43 AM 05/30/2024    4:03 AM 05/28/2024    5:21 AM  CBC  WBC 4.0 - 10.5 K/uL 3.2  3.7  4.4   Hemoglobin 13.0 - 17.0 g/dL 8.0  8.9  9.0   Hematocrit 39.0 - 52.0 % 24.4  28.2  28.7   Platelets 150 - 400 K/uL 333  364  343     CMP     Component Value Date/Time   NA 131 (L) 06/01/2024 0543   NA 134 05/19/2024 1150   NA 142 06/09/2012 2124   K 4.3 06/01/2024 0543   K 3.7 06/09/2012 2124   CL 92 (L) 06/01/2024 0543   CL 108 (H) 06/09/2012 2124   CO2 31 06/01/2024 0543   CO2 26 06/09/2012 2124   GLUCOSE 110 (H) 06/01/2024 0543   GLUCOSE 89 06/09/2012 2124   BUN 21 06/01/2024 0543   BUN 10 05/19/2024 1150   BUN 11 06/09/2012 2124   CREATININE 0.97 06/01/2024 0543   CREATININE 1.02 06/09/2012 2124   CALCIUM  8.5 (L) 06/01/2024 0543   CALCIUM  8.3 (L) 06/09/2012 2124   PROT 5.0 (L) 05/20/2024 0913   PROT 4.4 (LL) 05/19/2024 1150   PROT 6.4 06/09/2012 2124   ALBUMIN  3.1 (L) 06/01/2024 0543   ALBUMIN  2.4 (L) 05/19/2024 1150   ALBUMIN  3.5 06/09/2012 2124   AST 19 05/20/2024 0913   AST 19 05/14/2018 0912   AST 16 06/09/2012 2124   ALT 11 05/20/2024 0913   ALT 22 05/14/2018 0912   ALT 22 06/09/2012 2124   ALKPHOS 168 (H) 05/20/2024 0913   ALKPHOS 73 06/09/2012 2124   BILITOT 0.2 05/20/2024 0913   BILITOT 0.2 05/19/2024 1150    BILITOT 0.5 06/09/2012 2124   EGFR 102 05/19/2024 1150   GFRNONAA >60 06/01/2024 0543   GFRNONAA >60 06/09/2012 2124    Consults: Urology, nephrology, and hematology  Discharge Instructions:  Follow-up with PCP in 1 week Follow-up with Dr. Dennise, nephrologist, within 1 to 2 weeks of discharge Schedule an appointment with [facility] urology within 2 weeks of discharge  Disease/illness Education: Reviewed at length with patient today.  Home Health/Community Services Discussions/Referrals: None  Establishment or re-establishment of referral orders for community resources: None  Discussion with other health care providers: Reviewed all recent notes  Assessment and Support of treatment regimen adherence: Reviewed at length with patient today  Appointments Coordinated with: Reviewed at length with patient today  Education for self-management, independent living, and ADLs:  Reviewed at length with patient today     06/16/2024   10:31 AM 05/19/2024   11:04 AM 02/06/2024    9:37 AM 07/05/2023    8:12 AM 02/02/2023    4:07 PM  Depression screen PHQ 2/9  Decreased Interest 2 2 0 0 0  Down, Depressed, Hopeless 0 0 0 0 0  PHQ - 2 Score 2 2 0 0 0  Altered sleeping 0 2 0 0 0  Tired, decreased energy 2 2 0 0 0  Change in appetite 0 3 0 0 0  Feeling bad or failure about yourself  0 0 0 0 0  Trouble concentrating 0 0 0 0 0  Moving slowly or fidgety/restless 0 0 0 0 0  Suicidal thoughts 0 0 0 0 0  PHQ-9 Score 4 9 0  0  0   Difficult doing work/chores Not difficult at all   Not difficult at all Not difficult at all     Data saved with a previous flowsheet row definition       06/16/2024   10:31 AM 05/19/2024   11:04 AM 02/06/2024    9:37 AM 07/05/2023    8:12 AM  GAD 7 : Generalized Anxiety Score  Nervous, Anxious, on Edge 0 1 0 0  Control/stop worrying 0 0 0 0  Worry too much - different things 0 0 0 0  Trouble relaxing 0 1 0 0  Restless 0 0 0 0  Easily annoyed or irritable 0 1 0 0   Afraid - awful might happen 0 0 0 0  Total GAD 7 Score 0 3 0 0  Anxiety Difficulty Not difficult at all   Not difficult at all   Relevant past medical, surgical, family and social history reviewed and updated as indicated. Interim medical history since our last visit reviewed. Allergies and medications reviewed and updated.  Review of Systems  Constitutional:  Positive for fatigue. Negative for activity change, appetite change, diaphoresis and fever.  Respiratory:  Negative for cough, chest tightness, shortness of breath and wheezing.   Cardiovascular:  Positive for leg swelling (improving). Negative for chest pain and palpitations.  Gastrointestinal:  Negative for abdominal distention, abdominal pain, blood in stool, constipation, diarrhea, nausea and vomiting.  Endocrine: Negative for polydipsia, polyphagia and polyuria.  Genitourinary:  Positive for difficulty urinating and frequency. Negative for dysuria and urgency.  Neurological: Negative.   Psychiatric/Behavioral: Negative.     Per HPI unless specifically indicated above     Objective:    BP (!) 101/59 (BP Location: Left Arm, Patient Position: Sitting, Cuff Size: Normal)   Pulse 84   Temp (!) 97.4 F (36.3 C) (Oral)   Resp 17   Ht 6' 0.01 (1.829 m)   Wt 208 lb (94.3 kg)   SpO2 95%   BMI 28.20 kg/m   Wt Readings from Last 3 Encounters:  06/16/24 208 lb (94.3 kg)  06/02/24 201 lb 1 oz (91.2 kg)  05/19/24 225 lb (102.1 kg)    Physical Exam Vitals and nursing note reviewed.  Constitutional:      General: He is awake. He is not in acute distress.    Appearance: He is well-developed and well-groomed. He is not ill-appearing or toxic-appearing.  HENT:     Head: Normocephalic.     Right Ear: Hearing and external ear normal.     Left Ear: Hearing and external ear normal.  Eyes:  General: Lids are normal.     Extraocular Movements: Extraocular movements intact.     Conjunctiva/sclera: Conjunctivae normal.  Neck:      Thyroid: No thyromegaly.     Vascular: No carotid bruit.  Cardiovascular:     Rate and Rhythm: Normal rate and regular rhythm.     Heart sounds: Normal heart sounds. No murmur heard.    No gallop.     Comments: Overall swelling to BLE is improving. Pulmonary:     Effort: No accessory muscle usage or respiratory distress.     Breath sounds: Normal breath sounds. No decreased breath sounds, wheezing or rales.  Abdominal:     General: Bowel sounds are normal. There is no distension.     Palpations: Abdomen is soft.     Tenderness: There is no abdominal tenderness.     Comments: Overall previous distension of abdomen is improving.  Musculoskeletal:     Cervical back: Full passive range of motion without pain.     Right lower leg: 1+ Edema present.     Left lower leg: 1+ Edema present.  Lymphadenopathy:     Cervical: No cervical adenopathy.  Skin:    General: Skin is warm.     Capillary Refill: Capillary refill takes less than 2 seconds.  Neurological:     Mental Status: He is alert and oriented to person, place, and time.     Deep Tendon Reflexes: Reflexes are normal and symmetric.     Reflex Scores:      Brachioradialis reflexes are 2+ on the right side and 2+ on the left side.      Patellar reflexes are 2+ on the right side and 2+ on the left side. Psychiatric:        Attention and Perception: Attention normal.        Mood and Affect: Mood normal.        Speech: Speech normal.        Behavior: Behavior normal. Behavior is cooperative.        Thought Content: Thought content normal.    Results for orders placed or performed during the hospital encounter of 05/20/24  Comprehensive metabolic panel   Collection Time: 05/20/24  9:13 AM  Result Value Ref Range   Sodium 135 135 - 145 mmol/L   Potassium 3.1 (L) 3.5 - 5.1 mmol/L   Chloride 100 98 - 111 mmol/L   CO2 27 22 - 32 mmol/L   Glucose, Bld 127 (H) 70 - 99 mg/dL   BUN 11 8 - 23 mg/dL   Creatinine, Ser 9.18 0.61 - 1.24  mg/dL   Calcium  7.3 (L) 8.9 - 10.3 mg/dL   Total Protein 5.0 (L) 6.5 - 8.1 g/dL   Albumin  2.4 (L) 3.5 - 5.0 g/dL   AST 19 15 - 41 U/L   ALT 11 0 - 44 U/L   Alkaline Phosphatase 168 (H) 38 - 126 U/L   Total Bilirubin 0.2 0.0 - 1.2 mg/dL   GFR, Estimated >39 >39 mL/min   Anion gap 9 5 - 15  CBC   Collection Time: 05/20/24  9:13 AM  Result Value Ref Range   WBC 2.0 (L) 4.0 - 10.5 K/uL   RBC 2.19 (L) 4.22 - 5.81 MIL/uL   Hemoglobin 8.0 (L) 13.0 - 17.0 g/dL   HCT 74.1 (L) 60.9 - 47.9 %   MCV 117.8 (H) 80.0 - 100.0 fL   MCH 36.5 (H) 26.0 - 34.0 pg   MCHC 31.0 30.0 -  36.0 g/dL   RDW 84.4 88.4 - 84.4 %   Platelets 352 150 - 400 K/uL   nRBC 0.0 0.0 - 0.2 %  Urinalysis, Routine w reflex microscopic -Urine, Clean Catch   Collection Time: 05/20/24  9:13 AM  Result Value Ref Range   Color, Urine STRAW (A) YELLOW   APPearance CLEAR (A) CLEAR   Specific Gravity, Urine 1.006 1.005 - 1.030   pH 6.0 5.0 - 8.0   Glucose, UA >=500 (A) NEGATIVE mg/dL   Hgb urine dipstick SMALL (A) NEGATIVE   Bilirubin Urine NEGATIVE NEGATIVE   Ketones, ur NEGATIVE NEGATIVE mg/dL   Protein, ur 899 (A) NEGATIVE mg/dL   Nitrite NEGATIVE NEGATIVE   Leukocytes,Ua NEGATIVE NEGATIVE   RBC / HPF 0-5 0 - 5 RBC/hpf   WBC, UA 0-5 0 - 5 WBC/hpf   Bacteria, UA NONE SEEN NONE SEEN   Squamous Epithelial / HPF 0-5 0 - 5 /HPF   Hyaline Casts, UA PRESENT   CBC with Differential   Collection Time: 05/20/24  9:13 AM  Result Value Ref Range   WBC 2.0 (L) 4.0 - 10.5 K/uL   RBC 2.16 (L) 4.22 - 5.81 MIL/uL   Hemoglobin 7.9 (L) 13.0 - 17.0 g/dL   HCT 74.1 (L) 60.9 - 47.9 %   MCV 119.4 (H) 80.0 - 100.0 fL   MCH 36.6 (H) 26.0 - 34.0 pg   MCHC 30.6 30.0 - 36.0 g/dL   RDW 84.4 88.4 - 84.4 %   Platelets 367 150 - 400 K/uL   nRBC 0.0 0.0 - 0.2 %   Neutrophils Relative % 37 %   Neutro Abs 0.8 (L) 1.7 - 7.7 K/uL   Lymphocytes Relative 27 %   Lymphs Abs 0.5 (L) 0.7 - 4.0 K/uL   Monocytes Relative 27 %   Monocytes Absolute 0.5  0.1 - 1.0 K/uL   Eosinophils Relative 8 %   Eosinophils Absolute 0.2 0.0 - 0.5 K/uL   Basophils Relative 1 %   Basophils Absolute 0.0 0.0 - 0.1 K/uL   WBC Morphology See Note    RBC Morphology See Note    Smear Review Normal platelet morphology    Immature Granulocytes 0 %   Abs Immature Granulocytes 0.00 0.00 - 0.07 K/uL  TSH   Collection Time: 05/20/24  9:13 AM  Result Value Ref Range   TSH 3.140 0.350 - 4.500 uIU/mL  Pathologist smear review   Collection Time: 05/20/24  9:13 AM  Result Value Ref Range   Path Review Blood smear is reviewed.   Protein / creatinine ratio, urine   Collection Time: 05/20/24  9:13 AM  Result Value Ref Range   Creatinine, Urine 18 mg/dL   Total Protein, Urine 112 mg/dL   Protein Creatinine Ratio 6.2 (H) <0.2 mg/mg  Type and screen [facility]   Collection Time: 05/20/24  9:13 AM  Result Value Ref Range   ABO/RH(D) O POS    Antibody Screen NEG    Sample Expiration 05/23/2024,2359    Unit Number [id number]    Blood Component Type RED CELLS,LR    Unit division 00    Status of Unit ISSUED,FINAL    Transfusion Status OK TO TRANSFUSE    Crossmatch Result      Compatible Performed at [facility], [address] [place], [place] [address]   [facility]   Collection Time: 05/20/24  9:13 AM  Result Value Ref Range   ISSUE DATE / TIME [phone]  Blood Product Unit Number [id number]    PRODUCT CODE E0382V00    Unit Type and Rh 5100    Blood Product Expiration Date [id number]   HIV Antibody (routine testing w rflx)   Collection Time: 05/20/24  6:16 PM  Result Value Ref Range   HIV Screen 4th Generation wRfx Non Reactive Non Reactive  Pro Brain natriuretic peptide   Collection Time: 05/20/24  6:16 PM  Result Value Ref Range   Pro Brain Natriuretic Peptide 515.0 (H) <300.0 pg/mL  Magnesium   Collection Time: 05/20/24  6:16 PM  Result Value Ref Range   Magnesium 1.9 1.7 - 2.4 mg/dL  Glucose, capillary    Collection Time: 05/21/24  1:36 AM  Result Value Ref Range   Glucose-Capillary 104 (H) 70 - 99 mg/dL  Lactate dehydrogenase   Collection Time: 05/21/24  5:34 AM  Result Value Ref Range   LDH 143 105 - 235 U/L  Folate   Collection Time: 05/21/24  5:34 AM  Result Value Ref Range   Folate 10.9 >5.9 ng/mL  Multiple Myeloma Panel (SPEP&IFE w/QIG)   Collection Time: 05/21/24  5:34 AM  Result Value Ref Range   IgG (Immunoglobin G), Serum 615 603 - 1,613 mg/dL   IgA 677 61 - 562 mg/dL   IgM (Immunoglobulin M), Srm 40 20 - 172 mg/dL   Total Protein ELP 3.6 (L) 6.0 - 8.5 g/dL   Albumin  SerPl Elph-Mcnc 1.7 (L) 2.9 - 4.4 g/dL   Alpha 1 0.1 0.0 - 0.4 g/dL   Alpha2 Glob SerPl Elph-Mcnc 0.6 0.4 - 1.0 g/dL   B-Globulin SerPl Elph-Mcnc 0.7 0.7 - 1.3 g/dL   Gamma Glob SerPl Elph-Mcnc 0.5 0.4 - 1.8 g/dL   M Protein SerPl Elph-Mcnc Not Observed Not Observed g/dL   Globulin, Total 1.9 (L) 2.2 - 3.9 g/dL   Albumin /Glob SerPl 0.9 0.7 - 1.7   IFE 1 Comment    Please Note Comment   Kappa/lambda light chains   Collection Time: 05/21/24  5:34 AM  Result Value Ref Range   Kappa free light chain 45.1 (H) 3.3 - 19.4 mg/L   Lambda free light chains 44.4 (H) 5.7 - 26.3 mg/L   Kappa, lambda light chain ratio 1.02 0.26 - 1.65  Basic metabolic panel   Collection Time: 05/21/24  5:34 AM  Result Value Ref Range   Sodium 135 135 - 145 mmol/L   Potassium 3.2 (L) 3.5 - 5.1 mmol/L   Chloride 102 98 - 111 mmol/L   CO2 28 22 - 32 mmol/L   Glucose, Bld 85 70 - 99 mg/dL   BUN 11 8 - 23 mg/dL   Creatinine, Ser 9.38 0.61 - 1.24 mg/dL   Calcium  7.2 (L) 8.9 - 10.3 mg/dL   GFR, Estimated >39 >39 mL/min   Anion gap 5 5 - 15  CBC   Collection Time: 05/21/24  5:34 AM  Result Value Ref Range   WBC 1.4 (LL) 4.0 - 10.5 K/uL   RBC 1.80 (L) 4.22 - 5.81 MIL/uL   Hemoglobin 6.6 (L) 13.0 - 17.0 g/dL   HCT 79.4 (L) 60.9 - 47.9 %   MCV 113.9 (H) 80.0 - 100.0 fL   MCH 36.7 (H) 26.0 - 34.0 pg   MCHC 32.2 30.0 - 36.0 g/dL    RDW 84.9 88.4 - 84.4 %   Platelets 265 150 - 400 K/uL   nRBC 0.0 0.0 - 0.2 %  Hemoglobin A1c   Collection Time: 05/21/24  5:34 AM  Result Value Ref Range   Hgb A1c MFr Bld 4.5 (L) 4.8 - 5.6 %   Mean Plasma Glucose 82.45 mg/dL  Glucose, capillary   Collection Time: 05/21/24  7:35 AM  Result Value Ref Range   Glucose-Capillary 89 70 - 99 mg/dL   Comment 1 Notify RN    Comment 2 Document in Chart   Haptoglobin   Collection Time: 05/21/24  7:48 AM  Result Value Ref Range   Haptoglobin 90 32 - 363 mg/dL  Reticulocytes   Collection Time: 05/21/24  7:48 AM  Result Value Ref Range   Retic Ct Pct 3.5 (H) 0.4 - 3.1 %   RBC. 2.11 (L) 4.22 - 5.81 MIL/uL   Retic Count, Absolute 74.7 19.0 - 186.0 K/uL   Immature Retic Fract 29.8 (H) 2.3 - 15.9 %  ABO/Rh   Collection Time: 05/21/24  7:48 AM  Result Value Ref Range   ABO/RH(D)      O POS Performed at [facility], [address] [place], [place] [address]   Beta hCG quant (ref lab)   Collection Time: 05/21/24  9:12 AM  Result Value Ref Range   hCG Quant 3 0 - 3 mIU/mL  AFP tumor marker   Collection Time: 05/21/24  9:12 AM  Result Value Ref Range   AFP, Serum, Tumor Marker <1.8 0.0 - 8.4 ng/mL  Prepare RBC (crossmatch)   Collection Time: 05/21/24 10:02 AM  Result Value Ref Range   Order Confirmation      ORDER PROCESSED BY BLOOD BANK Performed at [facility], [address] [place], [place] [address]   ECHOCARDIOGRAM COMPLETE   Collection Time: 05/21/24 10:05 AM  Result Value Ref Range   Weight 3,516.78 oz   Height 72 in   BP 131/72 mmHg   Ao pk vel 1.10 m/s   AV Area VTI 4.28 cm2   AR max vel 3.47 cm2   AV Mean grad 3.0 mmHg   AV Peak grad 4.8 mmHg   S' Lateral 3.10 cm   AV Area mean vel 3.56 cm2   Area-P 1/2 6.07 cm2   Est EF 55 - 60%   Carbamazepine  and Metabolite, Free (as Misc Test)   Collection Time: 05/21/24 10:42 AM  Result Value Ref Range   [facility] test code 990014    [facility] test  name      TO QUEST CARBAMAZEPINE  AND METABOLITE FREE TEST 7933   Misc [facility] result See Scanned report in [facility]   ANA w/Reflex   Collection Time: 05/21/24 10:47 AM  Result Value Ref Range   Anti Nuclear Antibody (ANA) Negative Negative  ANCA Profile   Collection Time: 05/21/24 10:47 AM  Result Value Ref Range   Anti-MPO Antibodies <0.2 0.0 - 0.9 units   Anti-PR3 Antibodies <0.2 0.0 - 0.9 units   C-ANCA <1:20 Neg:<1:20 titer   P-ANCA <1:20 Neg:<1:20 titer   Atypical P-ANCA titer <1:20 Neg:<1:20 titer  Glomerular basement membrane antibodies   Collection Time: 05/21/24 10:47 AM  Result Value Ref Range   GBM Ab <0.2 0.0 - 0.9 units  C3 complement   Collection Time: 05/21/24 10:47 AM  Result Value Ref Range   C3 Complement 105 82 - 167 mg/dL  C4 complement   Collection Time: 05/21/24 10:47 AM  Result Value Ref Range   Complement C4, Body Fluid 23 12 - 38 mg/dL  Hepatitis B core antibody, total   Collection Time: 05/21/24 10:47 AM  Result Value Ref Range  HEP B CORE AB Negative Negative  Hepatitis B surface antigen   Collection Time: 05/21/24 10:47 AM  Result Value Ref Range   Hepatitis B Surface Ag NON REACTIVE NON REACTIVE  Hepatitis B core antibody, IgM   Collection Time: 05/21/24 10:47 AM  Result Value Ref Range   Hep B C IgM NON REACTIVE NON REACTIVE  Hepatitis C antibody   Collection Time: 05/21/24 10:47 AM  Result Value Ref Range   HCV Ab NON REACTIVE NON REACTIVE  Protime-INR   Collection Time: 05/21/24 10:47 AM  Result Value Ref Range   Prothrombin Time 13.8 11.4 - 15.2 seconds   INR 1.0 0.8 - 1.2  Glucose, capillary   Collection Time: 05/21/24 11:46 AM  Result Value Ref Range   Glucose-Capillary 87 70 - 99 mg/dL  Glucose, capillary   Collection Time: 05/21/24  4:39 PM  Result Value Ref Range   Glucose-Capillary 86 70 - 99 mg/dL  Glucose, capillary   Collection Time: 05/21/24  9:34 PM  Result Value Ref Range   Glucose-Capillary 103 (H) 70 -  99 mg/dL  CBC with Differential/Platelet   Collection Time: 05/22/24  4:57 AM  Result Value Ref Range   WBC 7.7 4.0 - 10.5 K/uL   RBC 2.61 (L) 4.22 - 5.81 MIL/uL   Hemoglobin 9.5 (L) 13.0 - 17.0 g/dL   HCT 69.9 (L) 60.9 - 47.9 %   MCV 114.9 (H) 80.0 - 100.0 fL   MCH 36.4 (H) 26.0 - 34.0 pg   MCHC 31.7 30.0 - 36.0 g/dL   RDW 80.8 (H) 88.4 - 84.4 %   Platelets 312 150 - 400 K/uL   nRBC 0.0 0.0 - 0.2 %   Neutrophils Relative % 66 %   Neutro Abs 5.2 1.7 - 7.7 K/uL   Lymphocytes Relative 16 %   Lymphs Abs 1.2 0.7 - 4.0 K/uL   Monocytes Relative 14 %   Monocytes Absolute 1.1 (H) 0.1 - 1.0 K/uL   Eosinophils Relative 2 %   Eosinophils Absolute 0.1 0.0 - 0.5 K/uL   Basophils Relative 1 %   Basophils Absolute 0.0 0.0 - 0.1 K/uL   WBC Morphology See Note    RBC Morphology See Note    Smear Review Normal platelet morphology    Immature Granulocytes 1 %   Abs Immature Granulocytes 0.09 (H) 0.00 - 0.07 K/uL   Polychromasia PRESENT   Basic metabolic panel   Collection Time: 05/22/24  4:57 AM  Result Value Ref Range   Sodium 134 (L) 135 - 145 mmol/L   Potassium 4.1 3.5 - 5.1 mmol/L   Chloride 99 98 - 111 mmol/L   CO2 25 22 - 32 mmol/L   Glucose, Bld 77 70 - 99 mg/dL   BUN 11 8 - 23 mg/dL   Creatinine, Ser 9.30 0.61 - 1.24 mg/dL   Calcium  7.7 (L) 8.9 - 10.3 mg/dL   GFR, Estimated >39 >39 mL/min   Anion gap 10 5 - 15  Glucose, capillary   Collection Time: 05/22/24  8:03 AM  Result Value Ref Range   Glucose-Capillary 71 70 - 99 mg/dL   Comment 1 Notify RN    Comment 2 Document in Chart   Glucose, capillary   Collection Time: 05/22/24 11:30 AM  Result Value Ref Range   Glucose-Capillary 81 70 - 99 mg/dL  Glucose, capillary   Collection Time: 05/22/24  5:13 PM  Result Value Ref Range   Glucose-Capillary 109 (H) 70 - 99 mg/dL  Comment 1 Notify RN    Comment 2 Document in Chart   Glucose, capillary   Collection Time: 05/22/24  9:44 PM  Result Value Ref Range    Glucose-Capillary 123 (H) 70 - 99 mg/dL  Surgical pathology   Collection Time: 05/23/24 12:00 AM  Result Value Ref Range   SURGICAL PATHOLOGY      Surgical Pathology CASE: [id number] PATIENT: Duvan Happel Bone Marrow Report     Clinical History: Pancytopenia     DIAGNOSIS:  BONE MARROW, ASPIRATE, CLOT, CORE: - Overall normocellular bone marrow with myeloid left shift with increased promyelocytes and the presence of myeloid and erythroid vacuolization, see note  PERIPHERAL BLOOD: - Normochromic normocytic anemia and history of leukopenia with absolute neutropenia  Note: It is noted the patient is on multiple psychiatric drugs and has a recent history of both anemia and leukopenia as well as nephrotic syndrome.  The current bone marrow shows a regenerative appearing bone marrow with left shift to both the myeloid and erythroid components, more prominent in the myeloid with prominent promyelocytes consistent with the recent administration of G-CSF.  While there is no definitive dysgranulopoiesis or dyserythropoiesis there is an element of vacuolization which can be seen in toxic etiologies as well  as deficiencies such as copper .  The megakaryocytes are overall unremarkable.  There are also increased histiocytes suggestive of previous damage.  Preliminary results were discussed with Dr. Rennie.  MICROSCOPIC DESCRIPTION:  PERIPHERAL BLOOD SMEAR: The peripheral blood smear and indices are reviewed revealing a macrocytic anemia (it is noted that the patient had had a leukopenia with absolute neutropenia and is status post G-CSF). There is also a monocytosis.  Morphologically the neutrophils are left shifted with prominent bandemia.  While there is a rare hypogranular form, overall definitive evidence of dysgranulopoiesis is not identified.  The monocytes are unremarkable.  There is no significant anisopoikilocytosis.  The platelets are unremarkable.  BONE  MARROW ASPIRATE: The bone marrow aspirate smear slide contains several cellular bone marrow spicules which are adequate for interpretation. Erythroid precursors: The erythroid series is present in overall  adequate number.  There is a mild left shift with evidence of early precursors with vascularization suggestive of possible toxic etiology or deficiency.  There is otherwise orderly maturation and unremarkable morphology without evidence of dyserythropoiesis. Granulocytic precursors: The myeloid series is present in adequate to increased number overall with a prominent left shift to include abundant promyelocytes and approximately 25%.  There is otherwise orderly maturation and unremarkable morphology.  It should be noted that there also appears to be abundant background histiocytes Megakaryocytes: The megakaryocytes are present in adequate number with unremarkable morphology. Lymphocytes/plasma cells: Lymphocytes and plasma cells are not increased and are overall small, round and mature.  TOUCH PREPARATIONS: The touch prep shows similar but suboptimal morphology compared to the aspirate smear slides, refer to above.  CLOT AND BIOPSY: The decalcified bone marrow biopsy  consists of a core of cortical and predominantly trabecular bone, but with extensive aspiration artifact and limited hematopoietic for marrow for evaluation. The hematopoietic marrow present ranges in cellularity from 30 to 60% with an overall cellularity of approximately 45% which is interpreted as normocellular for age.  There is an apparent left shift which is interpreted as myeloid in nature.  There are numerous scattered unremarkable erythroid colonies.  Megakaryocytes are unremarkable.  The clot section shows overall similar morphology compared to the clot sections, see above description.  IMMUNOHISTOCHEMICAL STAINS: Immunohistochemical stains were performed on both the  biopsy and clot section with appropriate  controls.  CD34 highlights blasts which are not increased.  CD117 highlights mildly increased immature precursors as well as numerous unremarkable-appearing mast cells.  E-cadherin highlights a mild erythroid left shift.  MPO highlights a mild myeloid hyper plasia.  CD138 highlights plasma cells with a normal immuno architecture that are polyclonal by kappa/lambda ISH.  IRON STAIN: Iron stains are performed on a bone marrow aspirate or touch imprint smear and section of clot. The controls stained appropriately.       Storage Iron: Adequate to increased histiocytic iron stores      Ring Sideroblasts: Not identified  ADDITIONAL DATA/TESTING: Stat PML-RARA FISH is negative.  Conventional cytogenetics are pending and will be reported separately.  Material is reserved for myeloid NexGen sequencing as clinically indicated/directed.  CELL COUNT DATA:  Bone Marrow count performed on 500 cells shows: Blasts:   1%   Myeloid:  58% Promyelocytes: 23%  Erythroid:     29% Myelocytes:    11%  Lymphocytes:   10% Metamyelocytes:     0%   Plasma cells:  1% Bands:    0% Neutrophils:   21%  M:E ratio:     2.00 Eosinophils:   2% Basophils:     0% Monocytes:     2%  Lab Data: CBC performed on 05/23/2024 shows: WBC: 7.3 k/uL  Neutro phils:   70% Hgb: 8.8 g/dL  Lymphocytes:   85% HCT: 27.7 %    Monocytes:     16% MCV: 112.1 fL  Eosinophils:   0% RDW: 18.5%     Basophils:     0% PLT: 330 k/uL    GROSS DESCRIPTION:  A. BM-aspirate smear  B. Received in B-plus fixative is a 2.5 x 1.2 x 0.2 cm aggregate of tissue.  Submitted entirely in B1.  C.  Received in B-plus fixative is 1 core of firm bone measuring 3.5 cm in length by 0.2 cm in diameter.  Submitted entirely in C1, following decalcification in Immunocal. (WC 05/23/2024)   Final Diagnosis performed by Mark LeGolvan DO.   Electronically signed 05/27/2024 Technical and / or Professional components performed at [facility], [address], [place], [place] [address].  Immunohistochemistry Technical component (if applicable) was performed at [facility]. [address], [address], [place], [place] [address].   IMMUNOHISTOCHEMISTRY DISCLAIMER (if applicable): Some of these immunohistochemical stains may  have been developed and the performance characteristics determine by [facility]. Some may not have been cleared or approved by the U.S. Food and Drug Administration. The FDA has determined that such clearance or approval is not necessary. This test is used for clinical purposes. It should not be regarded as investigational or for research. This laboratory is certified under the Clinical Laboratory Improvement Amendments of 1988 (CLIA-88) as qualified to perform high complexity clinical laboratory testing.  The controls stained appropriately.   IHC stains are performed on formalin fixed, paraffin embedded tissue using a 3,3diaminobenzidine (DAB) chromogen and Leica Bond Autostainer System. The staining intensity of the nucleus is score manually and is reported as the percentage of tumor cell nuclei demonstrating specific nuclear staining. The specimens are fixed in 10% Neutral Formalin for at least 6 hours and up to 72hrs. These tests are validated on decalc ified tissue. Results should be interpreted with caution given the possibility of false negative results on decalcified specimens. Antibody Clones are as follows ER-clone [id number], PR-clone 16, Ki67- clone MM1. Some of  these immunohistochemical stains may have been developed and the performance characteristics determined by [facility].   CBC with Differential/Platelet   Collection Time: 05/23/24  5:46 AM  Result Value Ref Range   WBC 7.3 4.0 - 10.5 K/uL   RBC 2.47 (L) 4.22 - 5.81 MIL/uL   Hemoglobin 8.8 (L) 13.0 - 17.0 g/dL   HCT 72.2 (L) 60.9 - 47.9 %   MCV 112.1 (H) 80.0 - 100.0 fL   MCH 35.6 (H) 26.0 -  34.0 pg   MCHC 31.8 30.0 - 36.0 g/dL   RDW 81.4 (H) 88.4 - 84.4 %   Platelets 330 150 - 400 K/uL   nRBC 0.0 0.0 - 0.2 %   Neutrophils Relative % 64 %   Neutro Abs 4.8 1.7 - 7.7 K/uL   Lymphocytes Relative 16 %   Lymphs Abs 1.2 0.7 - 4.0 K/uL   Monocytes Relative 15 %   Monocytes Absolute 1.1 (H) 0.1 - 1.0 K/uL   Eosinophils Relative 3 %   Eosinophils Absolute 0.2 0.0 - 0.5 K/uL   Basophils Relative 1 %   Basophils Absolute 0.0 0.0 - 0.1 K/uL   WBC Morphology See Note    RBC Morphology See Note    Smear Review Normal platelet morphology    Immature Granulocytes 1 %   Abs Immature Granulocytes 0.04 0.00 - 0.07 K/uL   Polychromasia PRESENT   Glucose, capillary   Collection Time: 05/23/24  8:09 AM  Result Value Ref Range   Glucose-Capillary 90 70 - 99 mg/dL   Comment 1 Notify RN    Comment 2 Document in Chart   CBC with Differential/Platelet   Collection Time: 05/23/24 11:17 AM  Result Value Ref Range   WBC 7.4 4.0 - 10.5 K/uL   RBC 2.73 (L) 4.22 - 5.81 MIL/uL   Hemoglobin 9.7 (L) 13.0 - 17.0 g/dL   HCT 69.7 (L) 60.9 - 47.9 %   MCV 110.6 (H) 80.0 - 100.0 fL   MCH 35.5 (H) 26.0 - 34.0 pg   MCHC 32.1 30.0 - 36.0 g/dL   RDW 81.3 (H) 88.4 - 84.4 %   Platelets 374 150 - 400 K/uL   nRBC 0.0 0.0 - 0.2 %   Neutrophils Relative % 59 %   Neutro Abs 4.4 1.7 - 7.7 K/uL   Lymphocytes Relative 21 %   Lymphs Abs 1.5 0.7 - 4.0 K/uL   Monocytes Relative 15 %   Monocytes Absolute 1.1 (H) 0.1 - 1.0 K/uL   Eosinophils Relative 4 %   Eosinophils Absolute 0.3 0.0 - 0.5 K/uL   Basophils Relative 1 %   Basophils Absolute 0.0 0.0 - 0.1 K/uL   WBC Morphology See Note    RBC Morphology See Note    Smear Review Normal platelet morphology    Immature Granulocytes 0 %   Abs Immature Granulocytes 0.02 0.00 - 0.07 K/uL  Glucose, capillary   Collection Time: 05/23/24 11:51 AM  Result Value Ref Range   Glucose-Capillary 246 (H) 70 - 99 mg/dL   Comment 1 Notify RN    Comment 2 Document in Chart    Glucose, capillary   Collection Time: 05/23/24  3:50 PM  Result Value Ref Range   Glucose-Capillary 117 (H) 70 - 99 mg/dL   Comment 1 Notify RN    Comment 2 Document in Chart   Glucose, capillary   Collection Time: 05/23/24  9:17 PM  Result Value Ref Range   Glucose-Capillary 93 70 - 99 mg/dL  CBC with Differential/Platelet   Collection Time: 05/24/24  5:13 AM  Result Value Ref Range   WBC 5.1 4.0 - 10.5 K/uL   RBC 2.52 (L) 4.22 - 5.81 MIL/uL   Hemoglobin 9.0 (L) 13.0 - 17.0 g/dL   HCT 71.2 (L) 60.9 - 47.9 %   MCV 113.9 (H) 80.0 - 100.0 fL   MCH 35.7 (H) 26.0 - 34.0 pg   MCHC 31.4 30.0 - 36.0 g/dL   RDW 81.7 (H) 88.4 - 84.4 %   Platelets 325 150 - 400 K/uL   nRBC 0.0 0.0 - 0.2 %   Neutrophils Relative % 49 %   Neutro Abs 2.5 1.7 - 7.7 K/uL   Lymphocytes Relative 27 %   Lymphs Abs 1.4 0.7 - 4.0 K/uL   Monocytes Relative 17 %   Monocytes Absolute 0.8 0.1 - 1.0 K/uL   Eosinophils Relative 5 %   Eosinophils Absolute 0.3 0.0 - 0.5 K/uL   Basophils Relative 1 %   Basophils Absolute 0.0 0.0 - 0.1 K/uL   Immature Granulocytes 1 %   Abs Immature Granulocytes 0.03 0.00 - 0.07 K/uL  Basic metabolic panel   Collection Time: 05/24/24  5:13 AM  Result Value Ref Range   Sodium 133 (L) 135 - 145 mmol/L   Potassium 3.6 3.5 - 5.1 mmol/L   Chloride 98 98 - 111 mmol/L   CO2 28 22 - 32 mmol/L   Glucose, Bld 177 (H) 70 - 99 mg/dL   BUN 13 8 - 23 mg/dL   Creatinine, Ser 9.21 0.61 - 1.24 mg/dL   Calcium  7.7 (L) 8.9 - 10.3 mg/dL   GFR, Estimated >39 >39 mL/min   Anion gap 6 5 - 15  Albumin    Collection Time: 05/24/24  8:59 AM  Result Value Ref Range   Albumin  2.3 (L) 3.5 - 5.0 g/dL  Glucose, capillary   Collection Time: 05/24/24 10:18 AM  Result Value Ref Range   Glucose-Capillary 210 (H) 70 - 99 mg/dL   Comment 1 Notify RN    Comment 2 Document in Chart   Glucose, capillary   Collection Time: 05/24/24  1:22 PM  Result Value Ref Range   Glucose-Capillary 81 70 - 99 mg/dL   Glucose, capillary   Collection Time: 05/24/24  2:19 PM  Result Value Ref Range   Glucose-Capillary 145 (H) 70 - 99 mg/dL   Comment 1 Notify RN    Comment 2 Document in Chart   Glucose, capillary   Collection Time: 05/24/24  5:53 PM  Result Value Ref Range   Glucose-Capillary 77 70 - 99 mg/dL  Glucose, capillary   Collection Time: 05/24/24  9:31 PM  Result Value Ref Range   Glucose-Capillary 124 (H) 70 - 99 mg/dL  Glucose, capillary   Collection Time: 05/25/24  8:41 AM  Result Value Ref Range   Glucose-Capillary 100 (H) 70 - 99 mg/dL  Glucose, capillary   Collection Time: 05/25/24 12:10 PM  Result Value Ref Range   Glucose-Capillary 96 70 - 99 mg/dL  Glucose, capillary   Collection Time: 05/25/24  4:31 PM  Result Value Ref Range   Glucose-Capillary 131 (H) 70 - 99 mg/dL  Glucose, capillary   Collection Time: 05/25/24 10:01 PM  Result Value Ref Range   Glucose-Capillary 124 (H) 70 - 99 mg/dL  CBC   Collection Time: 05/26/24  6:19 AM  Result Value Ref Range   WBC 4.8 4.0 - 10.5 K/uL   RBC 2.26 (L) 4.22 - 5.81  MIL/uL   Hemoglobin 8.0 (L) 13.0 - 17.0 g/dL   HCT 75.2 (L) 60.9 - 47.9 %   MCV 109.3 (H) 80.0 - 100.0 fL   MCH 35.4 (H) 26.0 - 34.0 pg   MCHC 32.4 30.0 - 36.0 g/dL   RDW 82.6 (H) 88.4 - 84.4 %   Platelets 309 150 - 400 K/uL   nRBC 0.0 0.0 - 0.2 %  Basic metabolic panel   Collection Time: 05/26/24  6:19 AM  Result Value Ref Range   Sodium 132 (L) 135 - 145 mmol/L   Potassium 4.0 3.5 - 5.1 mmol/L   Chloride 96 (L) 98 - 111 mmol/L   CO2 27 22 - 32 mmol/L   Glucose, Bld 85 70 - 99 mg/dL   BUN 12 8 - 23 mg/dL   Creatinine, Ser 9.27 0.61 - 1.24 mg/dL   Calcium  8.2 (L) 8.9 - 10.3 mg/dL   GFR, Estimated >39 >39 mL/min   Anion gap 8 5 - 15  Albumin    Collection Time: 05/26/24  6:19 AM  Result Value Ref Range   Albumin  2.9 (L) 3.5 - 5.0 g/dL  Glucose, capillary   Collection Time: 05/26/24  8:46 AM  Result Value Ref Range   Glucose-Capillary 109 (H) 70 - 99  mg/dL   Comment 1 Notify RN    Comment 2 Document in Chart   Glucose, capillary   Collection Time: 05/26/24 11:45 AM  Result Value Ref Range   Glucose-Capillary 117 (H) 70 - 99 mg/dL  Glucose, capillary   Collection Time: 05/26/24  4:09 PM  Result Value Ref Range   Glucose-Capillary 147 (H) 70 - 99 mg/dL   Comment 1 Notify RN    Comment 2 Document in Chart   Glucose, capillary   Collection Time: 05/26/24  9:39 PM  Result Value Ref Range   Glucose-Capillary 118 (H) 70 - 99 mg/dL  Miscellaneous [facility] test (send-out)   Collection Time: 05/27/24  5:28 AM  Result Value Ref Range   [facility] test code [id number]    [facility] test name PLA2R ANTIBODIES    Misc [facility] result COMMENT   Glucose, capillary   Collection Time: 05/27/24  7:32 AM  Result Value Ref Range   Glucose-Capillary 159 (H) 70 - 99 mg/dL   Comment 1 Notify RN    Comment 2 Document in Chart   Glucose, capillary   Collection Time: 05/27/24 11:31 AM  Result Value Ref Range   Glucose-Capillary 91 70 - 99 mg/dL   Comment 1 Notify RN    Comment 2 Document in Chart   Protein / creatinine ratio, urine   Collection Time: 05/27/24  4:09 PM  Result Value Ref Range   Creatinine, Urine 75 mg/dL   Total Protein, Urine >600 mg/dL   Protein Creatinine Ratio NOT CALCULATED <0.2 mg/mg  Glucose, capillary   Collection Time: 05/27/24  4:27 PM  Result Value Ref Range   Glucose-Capillary 112 (H) 70 - 99 mg/dL   Comment 1 Notify RN    Comment 2 Document in Chart   Glucose, capillary   Collection Time: 05/27/24  9:28 PM  Result Value Ref Range   Glucose-Capillary 174 (H) 70 - 99 mg/dL  CBC   Collection Time: 05/28/24  5:21 AM  Result Value Ref Range   WBC 4.4 4.0 - 10.5 K/uL   RBC 2.55 (L) 4.22 - 5.81 MIL/uL   Hemoglobin 9.0 (L) 13.0 - 17.0 g/dL   HCT 71.2 (L) 60.9 - 47.9 %  MCV 112.5 (H) 80.0 - 100.0 fL   MCH 35.3 (H) 26.0 - 34.0 pg   MCHC 31.4 30.0 - 36.0 g/dL   RDW 82.7 (H) 88.4 - 84.4 %   Platelets 343 150 - 400 K/uL    nRBC 0.0 0.0 - 0.2 %  Basic metabolic panel   Collection Time: 05/28/24  5:21 AM  Result Value Ref Range   Sodium 132 (L) 135 - 145 mmol/L   Potassium 4.1 3.5 - 5.1 mmol/L   Chloride 94 (L) 98 - 111 mmol/L   CO2 29 22 - 32 mmol/L   Glucose, Bld 126 (H) 70 - 99 mg/dL   BUN 15 8 - 23 mg/dL   Creatinine, Ser 9.17 0.61 - 1.24 mg/dL   Calcium  8.0 (L) 8.9 - 10.3 mg/dL   GFR, Estimated >39 >39 mL/min   Anion gap 8 5 - 15  Protime-INR   Collection Time: 05/28/24  5:21 AM  Result Value Ref Range   Prothrombin Time 13.0 11.4 - 15.2 seconds   INR 0.9 0.8 - 1.2  Copper , serum   Collection Time: 05/28/24  5:21 AM  Result Value Ref Range   Copper  8 (L) 69 - 132 ug/dL  Glucose, capillary   Collection Time: 05/28/24  8:03 AM  Result Value Ref Range   Glucose-Capillary 107 (H) 70 - 99 mg/dL  Glucose, capillary   Collection Time: 05/28/24 12:38 PM  Result Value Ref Range   Glucose-Capillary 112 (H) 70 - 99 mg/dL  Glucose, capillary   Collection Time: 05/28/24  4:58 PM  Result Value Ref Range   Glucose-Capillary 128 (H) 70 - 99 mg/dL  Glucose, capillary   Collection Time: 05/28/24  8:18 PM  Result Value Ref Range   Glucose-Capillary 138 (H) 70 - 99 mg/dL  Glucose, capillary   Collection Time: 05/29/24  8:02 AM  Result Value Ref Range   Glucose-Capillary 96 70 - 99 mg/dL   Comment 1 Notify RN    Comment 2 Document in Chart   Glucose, capillary   Collection Time: 05/29/24 12:13 PM  Result Value Ref Range   Glucose-Capillary 116 (H) 70 - 99 mg/dL   Comment 1 Notify RN    Comment 2 Document in Chart   Glucose, capillary   Collection Time: 05/29/24  4:54 PM  Result Value Ref Range   Glucose-Capillary 109 (H) 70 - 99 mg/dL   Comment 1 Notify RN    Comment 2 Document in Chart   Glucose, capillary   Collection Time: 05/29/24  8:55 PM  Result Value Ref Range   Glucose-Capillary 143 (H) 70 - 99 mg/dL  CBC   Collection Time: 05/30/24  4:03 AM  Result Value Ref Range   WBC 3.7 (L)  4.0 - 10.5 K/uL   RBC 2.57 (L) 4.22 - 5.81 MIL/uL   Hemoglobin 8.9 (L) 13.0 - 17.0 g/dL   HCT 71.7 (L) 60.9 - 47.9 %   MCV 109.7 (H) 80.0 - 100.0 fL   MCH 34.6 (H) 26.0 - 34.0 pg   MCHC 31.6 30.0 - 36.0 g/dL   RDW 83.4 (H) 88.4 - 84.4 %   Platelets 364 150 - 400 K/uL   nRBC 0.0 0.0 - 0.2 %  Renal function panel   Collection Time: 05/30/24  4:03 AM  Result Value Ref Range   Sodium 129 (L) 135 - 145 mmol/L   Potassium 4.6 3.5 - 5.1 mmol/L   Chloride 91 (L) 98 - 111 mmol/L   CO2 29  22 - 32 mmol/L   Glucose, Bld 119 (H) 70 - 99 mg/dL   BUN 20 8 - 23 mg/dL   Creatinine, Ser 8.91 0.61 - 1.24 mg/dL   Calcium  8.5 (L) 8.9 - 10.3 mg/dL   Phosphorus 4.8 (H) 2.5 - 4.6 mg/dL   Albumin  2.8 (L) 3.5 - 5.0 g/dL   GFR, Estimated >39 >39 mL/min   Anion gap 9 5 - 15  Magnesium   Collection Time: 05/30/24  4:03 AM  Result Value Ref Range   Magnesium 2.0 1.7 - 2.4 mg/dL  Glucose, capillary   Collection Time: 05/30/24  8:25 AM  Result Value Ref Range   Glucose-Capillary 95 70 - 99 mg/dL  Glucose, capillary   Collection Time: 05/30/24 11:57 AM  Result Value Ref Range   Glucose-Capillary 106 (H) 70 - 99 mg/dL   Comment 1 Notify RN    Comment 2 Document in Chart   Glucose, capillary   Collection Time: 05/30/24  5:07 PM  Result Value Ref Range   Glucose-Capillary 108 (H) 70 - 99 mg/dL  Glucose, capillary   Collection Time: 05/30/24 10:28 PM  Result Value Ref Range   Glucose-Capillary 119 (H) 70 - 99 mg/dL   Comment 1 Notify RN    Comment 2 Document in Chart   Creatinine, serum   Collection Time: 05/31/24  4:57 AM  Result Value Ref Range   Creatinine, Ser 1.04 0.61 - 1.24 mg/dL   GFR, Estimated >39 >39 mL/min  Sodium   Collection Time: 05/31/24  4:57 AM  Result Value Ref Range   Sodium 129 (L) 135 - 145 mmol/L  Glucose, capillary   Collection Time: 05/31/24  8:54 AM  Result Value Ref Range   Glucose-Capillary 137 (H) 70 - 99 mg/dL   Comment 1 Notify RN    Comment 2 Document in  Chart   Glucose, capillary   Collection Time: 05/31/24 11:43 AM  Result Value Ref Range   Glucose-Capillary 138 (H) 70 - 99 mg/dL   Comment 1 Notify RN    Comment 2 Document in Chart   Glucose, capillary   Collection Time: 05/31/24  4:45 PM  Result Value Ref Range   Glucose-Capillary 98 70 - 99 mg/dL  Glucose, capillary   Collection Time: 05/31/24  9:51 PM  Result Value Ref Range   Glucose-Capillary 106 (H) 70 - 99 mg/dL   Comment 1 Notify RN    Comment 2 Document in Chart   Renal function panel   Collection Time: 06/01/24  5:43 AM  Result Value Ref Range   Sodium 131 (L) 135 - 145 mmol/L   Potassium 4.3 3.5 - 5.1 mmol/L   Chloride 92 (L) 98 - 111 mmol/L   CO2 31 22 - 32 mmol/L   Glucose, Bld 110 (H) 70 - 99 mg/dL   BUN 21 8 - 23 mg/dL   Creatinine, Ser 9.02 0.61 - 1.24 mg/dL   Calcium  8.5 (L) 8.9 - 10.3 mg/dL   Phosphorus 4.4 2.5 - 4.6 mg/dL   Albumin  3.1 (L) 3.5 - 5.0 g/dL   GFR, Estimated >39 >39 mL/min   Anion gap 8 5 - 15  Magnesium   Collection Time: 06/01/24  5:43 AM  Result Value Ref Range   Magnesium 2.0 1.7 - 2.4 mg/dL  CBC   Collection Time: 06/01/24  5:43 AM  Result Value Ref Range   WBC 3.2 (L) 4.0 - 10.5 K/uL   RBC 2.27 (L) 4.22 - 5.81 MIL/uL  Hemoglobin 8.0 (L) 13.0 - 17.0 g/dL   HCT 75.5 (L) 60.9 - 47.9 %   MCV 107.5 (H) 80.0 - 100.0 fL   MCH 35.2 (H) 26.0 - 34.0 pg   MCHC 32.8 30.0 - 36.0 g/dL   RDW 83.7 (H) 88.4 - 84.4 %   Platelets 333 150 - 400 K/uL   nRBC 0.0 0.0 - 0.2 %  Glucose, capillary   Collection Time: 06/01/24  7:55 AM  Result Value Ref Range   Glucose-Capillary 110 (H) 70 - 99 mg/dL  Glucose, capillary   Collection Time: 06/01/24 11:55 AM  Result Value Ref Range   Glucose-Capillary 103 (H) 70 - 99 mg/dL  Glucose, capillary   Collection Time: 06/01/24  4:29 PM  Result Value Ref Range   Glucose-Capillary 123 (H) 70 - 99 mg/dL  Glucose, capillary   Collection Time: 06/01/24  8:35 PM  Result Value Ref Range   Glucose-Capillary  130 (H) 70 - 99 mg/dL   Comment 1 Notify RN    Comment 2 Document in Chart   Glucose, capillary   Collection Time: 06/02/24  8:15 AM  Result Value Ref Range   Glucose-Capillary 123 (H) 70 - 99 mg/dL   Comment 1 Notify RN    Comment 2 Document in Chart   Glucose, capillary   Collection Time: 06/02/24 12:03 PM  Result Value Ref Range   Glucose-Capillary 152 (H) 70 - 99 mg/dL  Glucose, capillary   Collection Time: 06/02/24  5:29 PM  Result Value Ref Range   Glucose-Capillary 111 (H) 70 - 99 mg/dL   Comment 1 Notify RN    Comment 2 Document in Chart       Assessment & Plan:   Problem List Items Addressed This Visit       Genitourinary   Right hydrocele   Ongoing, scheduled to see urology for further discussion and recommendations. Appreciate their input.      Nephrotic syndrome   Acute and is beginning to improve. He reports having stopped Tegretol , but does continue Seroquel . He will attend follow-ups with specialists as ordered. CBC and CMP today. Have recommended he schedule a sooner follow-up with his psychiatrist to discuss alternate treatments to help prevent worsening Bipolar. He had been on Tegretol  and Seroquel  for many years.      Relevant Orders   CBC with Differential/Platelet   Comprehensive metabolic panel with GFR     Other   Bipolar 2 disorder (HCC) - Primary   Chronic, ongoing.  Denies SI/HI.  At this time continue collaboration with psychiatry and recommend he follow-up with them sooner than scheduled since he has stopped Tegretol  and is taking Seroquel  only per hospital recommendations. Will attempt to get their records.       Relevant Orders   CBC with Differential/Platelet   Comprehensive metabolic panel with GFR   Anasarca   Acute and is beginning to improve. He reports having stopped Tegretol , but does continue Seroquel . He will attend follow-ups with specialists as ordered. CBC and CMP today.       I personally spent a total of 30 minutes in the  care of the patient today including preparing to see the patient, getting/reviewing separately obtained history, performing a medically appropriate exam/evaluation, counseling and educating, placing orders, and coordinating care.   Follow up plan: Return for as scheduled March 24th.      "

## 2024-06-16 NOTE — Assessment & Plan Note (Signed)
 Ongoing, scheduled to see urology for further discussion and recommendations. Appreciate their input.

## 2024-06-17 ENCOUNTER — Ambulatory Visit: Payer: Self-pay | Admitting: Nurse Practitioner

## 2024-06-17 LAB — COMPREHENSIVE METABOLIC PANEL WITH GFR
ALT: 24 IU/L (ref 0–44)
AST: 16 IU/L (ref 0–40)
Albumin: 2.8 g/dL — ABNORMAL LOW (ref 3.9–4.9)
Alkaline Phosphatase: 99 IU/L (ref 47–123)
BUN/Creatinine Ratio: 17 (ref 10–24)
BUN: 12 mg/dL (ref 8–27)
Bilirubin Total: 0.3 mg/dL (ref 0.0–1.2)
CO2: 22 mmol/L (ref 20–29)
Calcium: 8.1 mg/dL — ABNORMAL LOW (ref 8.6–10.2)
Chloride: 106 mmol/L (ref 96–106)
Creatinine, Ser: 0.69 mg/dL — ABNORMAL LOW (ref 0.76–1.27)
Globulin, Total: 2 g/dL (ref 1.5–4.5)
Glucose: 113 mg/dL — ABNORMAL HIGH (ref 70–99)
Potassium: 3.8 mmol/L (ref 3.5–5.2)
Sodium: 141 mmol/L (ref 134–144)
Total Protein: 4.8 g/dL — ABNORMAL LOW (ref 6.0–8.5)
eGFR: 103 mL/min/1.73

## 2024-06-17 LAB — CBC WITH DIFFERENTIAL/PLATELET
Basophils Absolute: 0 x10E3/uL (ref 0.0–0.2)
Basos: 1 %
EOS (ABSOLUTE): 0.1 x10E3/uL (ref 0.0–0.4)
Eos: 3 %
Hematocrit: 25.7 % — ABNORMAL LOW (ref 37.5–51.0)
Hemoglobin: 8 g/dL — ABNORMAL LOW (ref 13.0–17.7)
Immature Grans (Abs): 0 x10E3/uL (ref 0.0–0.1)
Immature Granulocytes: 0 %
Lymphocytes Absolute: 0.5 x10E3/uL — ABNORMAL LOW (ref 0.7–3.1)
Lymphs: 18 %
MCH: 32.3 pg (ref 26.6–33.0)
MCHC: 31.1 g/dL — ABNORMAL LOW (ref 31.5–35.7)
MCV: 104 fL — ABNORMAL HIGH (ref 79–97)
Monocytes Absolute: 0.5 x10E3/uL (ref 0.1–0.9)
Monocytes: 19 %
Neutrophils Absolute: 1.5 x10E3/uL (ref 1.4–7.0)
Neutrophils: 59 %
Platelets: 375 x10E3/uL (ref 150–450)
RBC: 2.48 x10E6/uL — CL (ref 4.14–5.80)
RDW: 13.4 % (ref 11.6–15.4)
WBC: 2.6 x10E3/uL — ABNORMAL LOW (ref 3.4–10.8)

## 2024-06-17 NOTE — Progress Notes (Signed)
 Contacted via MyChart  Good morning Derek Garza, your labs have returned: - CBC continues to show low hemoglobin and hematocrit + RBC. Ensure to get an iron rich diet and please remain of Tegretol  for now. Also ensure to follow-up with psychiatry ASAP to discuss alternate medications that may cause less issues but still control mood well. - Kidney function, creatinine and eGFR, remains stable, as is liver function, AST and ALT. Any questions? Keep being amazing!!  Thank you for allowing me to participate in your care.  I appreciate you. Kindest regards, Derek Garza

## 2024-06-18 DIAGNOSIS — N500 Atrophy of testis: Secondary | ICD-10-CM | POA: Insufficient documentation

## 2024-06-18 NOTE — Assessment & Plan Note (Signed)
 Moderate right hydrocele, simple  -Visualized on MRI pelvis (Dec 2025)

## 2024-06-18 NOTE — Progress Notes (Unsigned)
" ° °  06/23/2024 4:09 PM   Derek Garza 1960/09/25 [id number]  Reason for visit: Follow up left testicular lesion, Right hydrocele, BPH   HPI: 64 y.o. male, follow up with me today  Initially saw while inpatient Dec 2025  - MRI pelvis -right hydrocele, Left atrophic testicle  Prior HPI: Admitted via ED overnight (05/21/24)   - chronic Right scrotal swelling, weight gain, +malaise   - Scrotal US  = large right hydrocele with echogenic debris. Atrophic Left testicle with focal hypoechogenic lesion w/ calcification measuring 2 cm.  - CT CAP = NED or relevant GU pathology   On my exam, patient was conversational although a bit irritated with diet/food options this AM Describes slow progressive Right scrotal swelling x 3-6 mo- he is unsure Also, with similar progressive BLE edema, abdominal swelling, +weight gain   Hx of prior vasectomy  Hx of prior Right inguinal hernia repair with mesh (2024)  Hx of EtOH abuse    Physical Exam: There were no vitals taken for this visit.   Constitutional:  Alert and oriented, No acute distress. GU: ***  Laboratory Data: N/A  Pertinent Imaging: I have personally viewed and interpreted the MRI pelvis (05/21/24) -simple appearing moderate right hydrocele, atrophic left testicle without obvious concerning intratesticular mass IMPRESSION: 1. Left testicle is small and heterogeneous in signal intensity with circumferential capsular thickening and irregular capsular enhancement. No discrete suspicious intratesticular mass lesion evident by MRI. Imaging features are nonspecific but could be related to prior trauma or infection. Neoplasm considered less likely. Given the finding of concern is evident on sonography, follow-up scrotal ultrasound in 3 months recommended to ensure stability. 2. Moderate to large right hydrocele. 3. Right groin hernia contains only fat. 4. Diffuse body wall edema in the lower pelvis and upper thighs..    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

## 2024-06-18 NOTE — Assessment & Plan Note (Signed)
 Left atrophic testicle  - No evidence of intratesticular lesion via MRI  - Negative tumor markers

## 2024-06-20 ENCOUNTER — Encounter: Payer: Self-pay | Admitting: Urology

## 2024-06-23 ENCOUNTER — Ambulatory Visit: Admitting: Urology

## 2024-06-23 DIAGNOSIS — N433 Hydrocele, unspecified: Secondary | ICD-10-CM

## 2024-06-23 DIAGNOSIS — N500 Atrophy of testis: Secondary | ICD-10-CM

## 2024-06-26 NOTE — Progress Notes (Signed)
 "  07/02/2024 1:44 PM   Burman Bruington 31-Aug-1960 [id number]  Reason for visit: Follow up left testicular lesion, Right hydrocele, BPH   HPI: 64 y.o. male, follow up with me today  Initially saw while inpatient Dec 2025  - MRI pelvis -right hydrocele, Left atrophic testicle  Today he mentions progressive incomplete emptying, straining to void LUTS have been present many years, although subacute worsening following recent hospitalization  Prior HPI: Admitted via ED overnight (05/21/24)   - chronic Right scrotal swelling, weight gain, +malaise   - Scrotal US  = large right hydrocele with echogenic debris. Atrophic Left testicle with focal hypoechogenic lesion w/ calcification measuring 2 cm.  - CT CAP = NED or relevant GU pathology   On my exam, patient was conversational although a bit irritated with diet/food options this AM Describes slow progressive Right scrotal swelling x 3-6 mo- he is unsure Also, with similar progressive BLE edema, abdominal swelling, +weight gain   Hx of prior vasectomy  Hx of prior Right inguinal hernia repair with mesh (2024)  Hx of EtOH abuse  Family history of prostate cancer in father    Physical Exam: BP 116/63   Pulse (!) 102   Ht 6' (1.829 m)   Wt 197 lb 9.6 oz (89.6 kg)   BMI 26.80 kg/m    Constitutional:  Alert and oriented, No acute distress. GU: Baseball sized right simple hydrocele (slightly decreased in size from inpatient exam), nontender, unable to palpate right testicle.  Atrophic left testicle, no firm intratesticular masses.  Right inguinal hernia scar noted.  DRE- 40-50g gland although limited discomfort to palpate base.  No obvious nodules or firm asymmetry  Laboratory Data: Component Ref Range & Units (hover) 1 mo ago 12 mo ago 2 yr ago  Prostate Specific Ag, Serum 1.5 1.5 CM 1.7 CM    Pertinent Imaging: I have personally viewed and interpreted the MRI pelvis (05/21/24) -simple appearing moderate right hydrocele,  atrophic left testicle without obvious concerning intratesticular mass IMPRESSION: 1. Left testicle is small and heterogeneous in signal intensity with circumferential capsular thickening and irregular capsular enhancement. No discrete suspicious intratesticular mass lesion evident by MRI. Imaging features are nonspecific but could be related to prior trauma or infection. Neoplasm considered less likely. Given the finding of concern is evident on sonography, follow-up scrotal ultrasound in 3 months recommended to ensure stability. 2. Moderate to large right hydrocele. 3. Right groin hernia contains only fat. 4. Diffuse body wall edema in the lower pelvis and upper thighs..    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 Otherwise, no further management indicated at this time   Benign prostatic hyperplasia with lower urinary tract symptoms, symptom details unspecified Assessment & Plan: Spot bladder scan 813 ml today  - patient unable to void in clinic, last void was earlier this morning at home  - ~50g gland by CT, no hydronephrosis,C r 0.82 (Dec 2025)  First recommendation was indwelling catheterization until further workup.  He declined catheter at this time.  I do suspect this is a more subacute or chronic issue.  He does endorse better voiding habits at home.  As a second option, I recommended office cystoscopy to fully evaluate prostate/anatomy,  consideration for outlet procedure.  I will highly encourage him to receive catheter at that time if ongoing retention.  Explicit return precautions verbalized, unable to void at home, suprapubic discomfort, etc.  - Start empiric Flomax  today - Schedule office  cystoscopy, next available  Orders: -     Urinalysis, Complete -     Bladder Scan (Post Void Residual) in office  Prostate cancer screening Assessment & Plan: Recent PSA 1.5  -Stable for the last 3 years  -Family history of prostate cancer in father  -DRE reassuring today, no obvious firm nodularity  - Continue annual PSA screening -BPH workup as below   Other orders -     Tamsulosin  HCl; Take 1 capsule (0.4 mg total) by mouth daily.  Dispense: 90 capsule; Refill: 3        Penne JONELLE Skye, MD  [facility] [address], [address] [place], [place] [address] [phone] "

## 2024-06-26 NOTE — Assessment & Plan Note (Signed)
 Moderate right hydrocele, simple  -Visualized on MRI pelvis (Dec 2025)

## 2024-06-26 NOTE — Assessment & Plan Note (Addendum)
 Left atrophic testicle  - No evidence of intratesticular lesion via MRI  - Negative tumor markers  - Repeat scrotal US  in 6 mo to ensure stability. Otherwise, no further management indicated at this time

## 2024-07-02 ENCOUNTER — Ambulatory Visit: Admitting: Urology

## 2024-07-02 VITALS — BP 116/63 | HR 102 | Ht 72.0 in | Wt 197.6 lb

## 2024-07-02 DIAGNOSIS — Z125 Encounter for screening for malignant neoplasm of prostate: Secondary | ICD-10-CM | POA: Diagnosis not present

## 2024-07-02 DIAGNOSIS — N500 Atrophy of testis: Secondary | ICD-10-CM | POA: Diagnosis not present

## 2024-07-02 DIAGNOSIS — N400 Enlarged prostate without lower urinary tract symptoms: Secondary | ICD-10-CM | POA: Insufficient documentation

## 2024-07-02 DIAGNOSIS — N433 Hydrocele, unspecified: Secondary | ICD-10-CM | POA: Diagnosis not present

## 2024-07-02 DIAGNOSIS — N401 Enlarged prostate with lower urinary tract symptoms: Secondary | ICD-10-CM

## 2024-07-02 LAB — BLADDER SCAN AMB NON-IMAGING

## 2024-07-02 MED ORDER — TAMSULOSIN HCL 0.4 MG PO CAPS: 0.4000 mg | ORAL_CAPSULE | Freq: Every day | ORAL | 3 refills | Status: AC

## 2024-07-02 NOTE — Patient Instructions (Signed)

## 2024-07-02 NOTE — Assessment & Plan Note (Signed)
 Recent PSA 1.5  -Stable for the last 3 years  -Family history of prostate cancer in father  -DRE reassuring today, no obvious firm nodularity  - Continue annual PSA screening -BPH workup as below

## 2024-07-02 NOTE — Assessment & Plan Note (Addendum)
 Spot bladder scan 813 ml today  - patient unable to void in clinic, last void was earlier this morning at home  - ~50g gland by CT, no hydronephrosis,C r 0.82 (Dec 2025)  First recommendation was indwelling catheterization until further workup.  He declined catheter at this time.  I do suspect this is a more subacute or chronic issue.  He does endorse better voiding habits at home.  As a second option, I recommended office cystoscopy to fully evaluate prostate/anatomy, consideration for outlet procedure.  I will highly encourage him to receive catheter at that time if ongoing retention.  Explicit return precautions verbalized, unable to void at home, suprapubic discomfort, etc.  - Start empiric Flomax  today - Schedule office cystoscopy, next available

## 2024-08-01 ENCOUNTER — Other Ambulatory Visit: Admitting: Urology

## 2024-08-19 ENCOUNTER — Encounter: Admitting: Nurse Practitioner
